# Patient Record
Sex: Female | Born: 1935 | Race: White | Hispanic: No | Smoking: Never smoker
Health system: Southern US, Community
[De-identification: ages and names within clinical notes are randomized; demographics above are authoritative.]

## PROBLEM LIST (undated history)

## (undated) DIAGNOSIS — H3530 Unspecified macular degeneration: Secondary | ICD-10-CM

## (undated) DIAGNOSIS — M1990 Unspecified osteoarthritis, unspecified site: Secondary | ICD-10-CM

## (undated) DIAGNOSIS — F329 Major depressive disorder, single episode, unspecified: Secondary | ICD-10-CM

## (undated) DIAGNOSIS — E785 Hyperlipidemia, unspecified: Secondary | ICD-10-CM

## (undated) DIAGNOSIS — K449 Diaphragmatic hernia without obstruction or gangrene: Secondary | ICD-10-CM

## (undated) DIAGNOSIS — H9190 Unspecified hearing loss, unspecified ear: Secondary | ICD-10-CM

## (undated) DIAGNOSIS — R011 Cardiac murmur, unspecified: Secondary | ICD-10-CM

## (undated) DIAGNOSIS — F32A Depression, unspecified: Secondary | ICD-10-CM

## (undated) DIAGNOSIS — I447 Left bundle-branch block, unspecified: Secondary | ICD-10-CM

## (undated) DIAGNOSIS — K219 Gastro-esophageal reflux disease without esophagitis: Secondary | ICD-10-CM

## (undated) DIAGNOSIS — I10 Essential (primary) hypertension: Secondary | ICD-10-CM

## (undated) DIAGNOSIS — R6889 Other general symptoms and signs: Secondary | ICD-10-CM

## (undated) DIAGNOSIS — C439 Malignant melanoma of skin, unspecified: Secondary | ICD-10-CM

## (undated) DIAGNOSIS — R911 Solitary pulmonary nodule: Secondary | ICD-10-CM

## (undated) DIAGNOSIS — J189 Pneumonia, unspecified organism: Secondary | ICD-10-CM

## (undated) DIAGNOSIS — N183 Chronic kidney disease, stage 3 unspecified: Secondary | ICD-10-CM

## (undated) DIAGNOSIS — Z9289 Personal history of other medical treatment: Secondary | ICD-10-CM

## (undated) DIAGNOSIS — E039 Hypothyroidism, unspecified: Secondary | ICD-10-CM

## (undated) HISTORY — DX: Hypothyroidism, unspecified: E03.9

## (undated) HISTORY — DX: Hyperlipidemia, unspecified: E78.5

## (undated) HISTORY — DX: Chronic kidney disease, stage 3 (moderate): N18.3

## (undated) HISTORY — DX: Chronic kidney disease, stage 3 unspecified: N18.30

## (undated) HISTORY — DX: Personal history of other medical treatment: Z92.89

## (undated) HISTORY — DX: Solitary pulmonary nodule: R91.1

## (undated) HISTORY — DX: Left bundle-branch block, unspecified: I44.7

## (undated) HISTORY — PX: BLADDER REPAIR: SHX76

## (undated) HISTORY — PX: ROTATOR CUFF REPAIR: SHX139

## (undated) HISTORY — DX: Cardiac murmur, unspecified: R01.1

## (undated) HISTORY — PX: OTHER SURGICAL HISTORY: SHX169

## (undated) HISTORY — PX: COLONOSCOPY: SHX174

## (undated) HISTORY — PX: TOTAL ABDOMINAL HYSTERECTOMY W/ BILATERAL SALPINGOOPHORECTOMY: SHX83

## (undated) HISTORY — PX: CARDIAC CATHETERIZATION: SHX172

## (undated) HISTORY — DX: Diaphragmatic hernia without obstruction or gangrene: K44.9

## (undated) HISTORY — DX: Gastro-esophageal reflux disease without esophagitis: K21.9

## (undated) HISTORY — DX: Malignant melanoma of skin, unspecified: C43.9

## (undated) HISTORY — PX: CATARACT EXTRACTION: SUR2

## (undated) HISTORY — DX: Essential (primary) hypertension: I10

## (undated) HISTORY — DX: Unspecified macular degeneration: H35.30

---
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
## 1998-07-29 ENCOUNTER — Other Ambulatory Visit: Admission: RE | Admit: 1998-07-29 | Discharge: 1998-07-29 | Payer: Self-pay

## 1999-07-09 ENCOUNTER — Other Ambulatory Visit: Admission: RE | Admit: 1999-07-09 | Discharge: 1999-07-09 | Payer: Self-pay | Admitting: *Deleted

## 1999-08-24 ENCOUNTER — Emergency Department (HOSPITAL_COMMUNITY): Admission: EM | Admit: 1999-08-24 | Discharge: 1999-08-24 | Payer: Self-pay | Admitting: Emergency Medicine

## 1999-08-24 ENCOUNTER — Encounter: Payer: Self-pay | Admitting: Emergency Medicine

## 2000-06-29 ENCOUNTER — Other Ambulatory Visit: Admission: RE | Admit: 2000-06-29 | Discharge: 2000-06-29 | Payer: Self-pay | Admitting: *Deleted

## 2000-07-11 ENCOUNTER — Encounter: Admission: RE | Admit: 2000-07-11 | Discharge: 2000-07-11 | Payer: Self-pay | Admitting: Orthopedic Surgery

## 2000-07-11 ENCOUNTER — Encounter: Payer: Self-pay | Admitting: Orthopedic Surgery

## 2000-08-16 ENCOUNTER — Encounter: Payer: Self-pay | Admitting: Orthopedic Surgery

## 2000-08-16 ENCOUNTER — Encounter: Admission: RE | Admit: 2000-08-16 | Discharge: 2000-08-16 | Payer: Self-pay | Admitting: Orthopedic Surgery

## 2000-09-13 ENCOUNTER — Encounter: Admission: RE | Admit: 2000-09-13 | Discharge: 2000-09-13 | Payer: Self-pay | Admitting: Orthopedic Surgery

## 2000-09-13 ENCOUNTER — Encounter: Payer: Self-pay | Admitting: Orthopedic Surgery

## 2001-02-14 ENCOUNTER — Encounter: Payer: Self-pay | Admitting: Orthopedic Surgery

## 2001-02-14 ENCOUNTER — Encounter: Admission: RE | Admit: 2001-02-14 | Discharge: 2001-02-14 | Payer: Self-pay | Admitting: Orthopedic Surgery

## 2001-07-12 ENCOUNTER — Emergency Department (HOSPITAL_COMMUNITY): Admission: EM | Admit: 2001-07-12 | Discharge: 2001-07-12 | Payer: Self-pay | Admitting: Emergency Medicine

## 2001-08-29 ENCOUNTER — Inpatient Hospital Stay (HOSPITAL_COMMUNITY): Admission: RE | Admit: 2001-08-29 | Discharge: 2001-09-01 | Payer: Self-pay | Admitting: *Deleted

## 2001-10-19 ENCOUNTER — Emergency Department (HOSPITAL_COMMUNITY): Admission: EM | Admit: 2001-10-19 | Discharge: 2001-10-20 | Payer: Self-pay | Admitting: Emergency Medicine

## 2001-10-20 ENCOUNTER — Encounter: Payer: Self-pay | Admitting: Emergency Medicine

## 2001-11-22 ENCOUNTER — Emergency Department (HOSPITAL_COMMUNITY): Admission: EM | Admit: 2001-11-22 | Discharge: 2001-11-23 | Payer: Self-pay | Admitting: Emergency Medicine

## 2001-11-23 ENCOUNTER — Encounter: Payer: Self-pay | Admitting: Emergency Medicine

## 2001-12-19 ENCOUNTER — Ambulatory Visit (HOSPITAL_COMMUNITY): Admission: RE | Admit: 2001-12-19 | Discharge: 2001-12-19 | Payer: Self-pay | Admitting: General Surgery

## 2002-06-11 ENCOUNTER — Encounter: Admission: RE | Admit: 2002-06-11 | Discharge: 2002-06-11 | Payer: Self-pay | Admitting: Orthopedic Surgery

## 2002-06-11 ENCOUNTER — Encounter: Payer: Self-pay | Admitting: Orthopedic Surgery

## 2002-06-12 ENCOUNTER — Ambulatory Visit (HOSPITAL_BASED_OUTPATIENT_CLINIC_OR_DEPARTMENT_OTHER): Admission: RE | Admit: 2002-06-12 | Discharge: 2002-06-12 | Payer: Self-pay | Admitting: Orthopedic Surgery

## 2002-07-02 ENCOUNTER — Ambulatory Visit (HOSPITAL_COMMUNITY): Admission: RE | Admit: 2002-07-02 | Discharge: 2002-07-03 | Payer: Self-pay | Admitting: Orthopedic Surgery

## 2002-07-25 ENCOUNTER — Other Ambulatory Visit: Admission: RE | Admit: 2002-07-25 | Discharge: 2002-07-25 | Payer: Self-pay | Admitting: *Deleted

## 2002-10-16 ENCOUNTER — Ambulatory Visit (HOSPITAL_COMMUNITY): Admission: RE | Admit: 2002-10-16 | Discharge: 2002-10-16 | Payer: Self-pay | Admitting: Gastroenterology

## 2003-04-13 DIAGNOSIS — Z9289 Personal history of other medical treatment: Secondary | ICD-10-CM

## 2003-04-13 HISTORY — DX: Personal history of other medical treatment: Z92.89

## 2003-06-30 ENCOUNTER — Observation Stay (HOSPITAL_COMMUNITY): Admission: EM | Admit: 2003-06-30 | Discharge: 2003-07-01 | Payer: Self-pay | Admitting: Emergency Medicine

## 2003-07-01 DIAGNOSIS — R911 Solitary pulmonary nodule: Secondary | ICD-10-CM

## 2003-07-01 HISTORY — DX: Solitary pulmonary nodule: R91.1

## 2003-09-02 ENCOUNTER — Other Ambulatory Visit: Admission: RE | Admit: 2003-09-02 | Discharge: 2003-09-02 | Payer: Self-pay | Admitting: Obstetrics and Gynecology

## 2004-10-21 ENCOUNTER — Observation Stay (HOSPITAL_COMMUNITY): Admission: RE | Admit: 2004-10-21 | Discharge: 2004-10-22 | Payer: Self-pay | Admitting: Orthopedic Surgery

## 2004-11-12 DIAGNOSIS — C439 Malignant melanoma of skin, unspecified: Secondary | ICD-10-CM

## 2004-11-12 HISTORY — DX: Malignant melanoma of skin, unspecified: C43.9

## 2004-11-19 ENCOUNTER — Ambulatory Visit (HOSPITAL_COMMUNITY): Admission: RE | Admit: 2004-11-19 | Discharge: 2004-11-19 | Payer: Self-pay | Admitting: General Surgery

## 2004-12-02 ENCOUNTER — Ambulatory Visit (HOSPITAL_COMMUNITY): Admission: RE | Admit: 2004-12-02 | Discharge: 2004-12-03 | Payer: Self-pay | Admitting: General Surgery

## 2004-12-02 ENCOUNTER — Encounter (INDEPENDENT_AMBULATORY_CARE_PROVIDER_SITE_OTHER): Payer: Self-pay | Admitting: *Deleted

## 2004-12-15 ENCOUNTER — Ambulatory Visit: Payer: Self-pay | Admitting: Oncology

## 2005-04-12 HISTORY — PX: TRANSTHORACIC ECHOCARDIOGRAM: SHX275

## 2005-04-19 ENCOUNTER — Ambulatory Visit: Payer: Self-pay | Admitting: Oncology

## 2005-08-29 ENCOUNTER — Encounter: Payer: Self-pay | Admitting: *Deleted

## 2005-08-30 ENCOUNTER — Observation Stay (HOSPITAL_COMMUNITY): Admission: EM | Admit: 2005-08-30 | Discharge: 2005-08-31 | Payer: Self-pay | Admitting: Cardiology

## 2005-09-23 ENCOUNTER — Ambulatory Visit (HOSPITAL_COMMUNITY): Admission: RE | Admit: 2005-09-23 | Discharge: 2005-09-23 | Payer: Self-pay | Admitting: Gastroenterology

## 2005-10-27 ENCOUNTER — Ambulatory Visit: Payer: Self-pay | Admitting: Oncology

## 2005-11-01 LAB — COMPREHENSIVE METABOLIC PANEL
ALT: 12 U/L (ref 0–40)
AST: 19 U/L (ref 0–37)
Albumin: 4.6 g/dL (ref 3.5–5.2)
Alkaline Phosphatase: 53 U/L (ref 39–117)
BUN: 31 mg/dL — ABNORMAL HIGH (ref 6–23)
CO2: 27 mEq/L (ref 19–32)
Calcium: 10.2 mg/dL (ref 8.4–10.5)
Chloride: 104 mEq/L (ref 96–112)
Creatinine, Ser: 1.38 mg/dL — ABNORMAL HIGH (ref 0.40–1.20)
Glucose, Bld: 101 mg/dL — ABNORMAL HIGH (ref 70–99)
Potassium: 4.3 mEq/L (ref 3.5–5.3)
Sodium: 141 mEq/L (ref 135–145)
Total Bilirubin: 0.6 mg/dL (ref 0.3–1.2)
Total Protein: 7.6 g/dL (ref 6.0–8.3)

## 2005-11-01 LAB — LACTATE DEHYDROGENASE: LDH: 172 U/L (ref 94–250)

## 2005-11-01 LAB — CBC WITH DIFFERENTIAL/PLATELET
BASO%: 0.6 % (ref 0.0–2.0)
Basophils Absolute: 0 10*3/uL (ref 0.0–0.1)
EOS%: 4.1 % (ref 0.0–7.0)
Eosinophils Absolute: 0.3 10*3/uL (ref 0.0–0.5)
HCT: 43.2 % (ref 34.8–46.6)
HGB: 14.6 g/dL (ref 11.6–15.9)
LYMPH%: 25.4 % (ref 14.0–48.0)
MCH: 32.5 pg (ref 26.0–34.0)
MCHC: 33.9 g/dL (ref 32.0–36.0)
MCV: 96.1 fL (ref 81.0–101.0)
MONO#: 0.7 10*3/uL (ref 0.1–0.9)
MONO%: 8.5 % (ref 0.0–13.0)
NEUT#: 4.9 10*3/uL (ref 1.5–6.5)
NEUT%: 61.4 % (ref 39.6–76.8)
Platelets: 310 10*3/uL (ref 145–400)
RBC: 4.49 10*6/uL (ref 3.70–5.32)
RDW: 13.8 % (ref 11.3–14.5)
WBC: 7.9 10*3/uL (ref 3.9–10.0)
lymph#: 2 10*3/uL (ref 0.9–3.3)

## 2006-01-20 ENCOUNTER — Ambulatory Visit (HOSPITAL_COMMUNITY): Admission: RE | Admit: 2006-01-20 | Discharge: 2006-01-20 | Payer: Self-pay | Admitting: Gastroenterology

## 2006-02-14 ENCOUNTER — Ambulatory Visit (HOSPITAL_COMMUNITY): Admission: RE | Admit: 2006-02-14 | Discharge: 2006-02-14 | Payer: Self-pay | Admitting: Gastroenterology

## 2006-04-28 ENCOUNTER — Ambulatory Visit: Payer: Self-pay | Admitting: Oncology

## 2006-05-03 LAB — COMPREHENSIVE METABOLIC PANEL
ALT: 13 U/L (ref 0–35)
AST: 21 U/L (ref 0–37)
Albumin: 4.5 g/dL (ref 3.5–5.2)
Alkaline Phosphatase: 46 U/L (ref 39–117)
BUN: 27 mg/dL — ABNORMAL HIGH (ref 6–23)
CO2: 27 mEq/L (ref 19–32)
Calcium: 9.7 mg/dL (ref 8.4–10.5)
Chloride: 105 mEq/L (ref 96–112)
Creatinine, Ser: 1.35 mg/dL — ABNORMAL HIGH (ref 0.40–1.20)
Glucose, Bld: 97 mg/dL (ref 70–99)
Potassium: 4.1 mEq/L (ref 3.5–5.3)
Sodium: 141 mEq/L (ref 135–145)
Total Bilirubin: 0.6 mg/dL (ref 0.3–1.2)
Total Protein: 7.5 g/dL (ref 6.0–8.3)

## 2006-05-03 LAB — CBC WITH DIFFERENTIAL/PLATELET
BASO%: 0.7 % (ref 0.0–2.0)
Basophils Absolute: 0.1 10*3/uL (ref 0.0–0.1)
EOS%: 3.8 % (ref 0.0–7.0)
Eosinophils Absolute: 0.3 10*3/uL (ref 0.0–0.5)
HCT: 42.3 % (ref 34.8–46.6)
HGB: 14.4 g/dL (ref 11.6–15.9)
LYMPH%: 25 % (ref 14.0–48.0)
MCH: 32.7 pg (ref 26.0–34.0)
MCHC: 34.1 g/dL (ref 32.0–36.0)
MCV: 96.1 fL (ref 81.0–101.0)
MONO#: 0.7 10*3/uL (ref 0.1–0.9)
MONO%: 9.2 % (ref 0.0–13.0)
NEUT#: 4.7 10*3/uL (ref 1.5–6.5)
NEUT%: 61.3 % (ref 39.6–76.8)
Platelets: 302 10*3/uL (ref 145–400)
RBC: 4.41 10*6/uL (ref 3.70–5.32)
RDW: 13.4 % (ref 11.3–14.5)
WBC: 7.6 10*3/uL (ref 3.9–10.0)
lymph#: 1.9 10*3/uL (ref 0.9–3.3)

## 2006-05-03 LAB — LACTATE DEHYDROGENASE: LDH: 189 U/L (ref 94–250)

## 2006-05-10 ENCOUNTER — Ambulatory Visit: Payer: Self-pay | Admitting: Internal Medicine

## 2006-06-07 ENCOUNTER — Ambulatory Visit: Payer: Self-pay | Admitting: Internal Medicine

## 2006-10-10 ENCOUNTER — Encounter: Admission: RE | Admit: 2006-10-10 | Discharge: 2006-10-10 | Payer: Self-pay | Admitting: Orthopedic Surgery

## 2006-10-11 ENCOUNTER — Ambulatory Visit (HOSPITAL_BASED_OUTPATIENT_CLINIC_OR_DEPARTMENT_OTHER): Admission: RE | Admit: 2006-10-11 | Discharge: 2006-10-11 | Payer: Self-pay | Admitting: Orthopedic Surgery

## 2006-10-28 ENCOUNTER — Ambulatory Visit: Payer: Self-pay | Admitting: Oncology

## 2006-11-01 LAB — CBC WITH DIFFERENTIAL/PLATELET
BASO%: 2.4 % — ABNORMAL HIGH (ref 0.0–2.0)
Basophils Absolute: 0.1 10*3/uL (ref 0.0–0.1)
EOS%: 6.6 % (ref 0.0–7.0)
Eosinophils Absolute: 0.4 10*3/uL (ref 0.0–0.5)
HCT: 36.8 % (ref 34.8–46.6)
HGB: 13 g/dL (ref 11.6–15.9)
LYMPH%: 23.3 % (ref 14.0–48.0)
MCH: 33.1 pg (ref 26.0–34.0)
MCHC: 35.5 g/dL (ref 32.0–36.0)
MCV: 93.2 fL (ref 81.0–101.0)
MONO#: 0.7 10*3/uL (ref 0.1–0.9)
MONO%: 10.8 % (ref 0.0–13.0)
NEUT#: 3.6 10*3/uL (ref 1.5–6.5)
NEUT%: 57 % (ref 39.6–76.8)
Platelets: 269 10*3/uL (ref 145–400)
RBC: 3.94 10*6/uL (ref 3.70–5.32)
RDW: 11.2 % — ABNORMAL LOW (ref 11.3–14.5)
WBC: 6.3 10*3/uL (ref 3.9–10.0)
lymph#: 1.5 10*3/uL (ref 0.9–3.3)

## 2006-11-01 LAB — COMPREHENSIVE METABOLIC PANEL
ALT: 11 U/L (ref 0–35)
AST: 22 U/L (ref 0–37)
Albumin: 4 g/dL (ref 3.5–5.2)
Alkaline Phosphatase: 43 U/L (ref 39–117)
BUN: 29 mg/dL — ABNORMAL HIGH (ref 6–23)
CO2: 19 mEq/L (ref 19–32)
Calcium: 9.2 mg/dL (ref 8.4–10.5)
Chloride: 110 mEq/L (ref 96–112)
Creatinine, Ser: 1.21 mg/dL — ABNORMAL HIGH (ref 0.40–1.20)
Glucose, Bld: 126 mg/dL — ABNORMAL HIGH (ref 70–99)
Potassium: 4.3 mEq/L (ref 3.5–5.3)
Sodium: 142 mEq/L (ref 135–145)
Total Bilirubin: 0.5 mg/dL (ref 0.3–1.2)
Total Protein: 6.4 g/dL (ref 6.0–8.3)

## 2006-11-01 LAB — LACTATE DEHYDROGENASE: LDH: 219 U/L (ref 94–250)

## 2007-05-02 ENCOUNTER — Ambulatory Visit: Payer: Self-pay | Admitting: Oncology

## 2007-05-04 LAB — COMPREHENSIVE METABOLIC PANEL
ALT: 17 U/L (ref 0–35)
AST: 24 U/L (ref 0–37)
Albumin: 4.1 g/dL (ref 3.5–5.2)
Alkaline Phosphatase: 53 U/L (ref 39–117)
BUN: 22 mg/dL (ref 6–23)
CO2: 24 mEq/L (ref 19–32)
Calcium: 9.2 mg/dL (ref 8.4–10.5)
Chloride: 106 mEq/L (ref 96–112)
Creatinine, Ser: 1.26 mg/dL — ABNORMAL HIGH (ref 0.40–1.20)
Glucose, Bld: 120 mg/dL — ABNORMAL HIGH (ref 70–99)
Potassium: 4.4 mEq/L (ref 3.5–5.3)
Sodium: 141 mEq/L (ref 135–145)
Total Bilirubin: 0.6 mg/dL (ref 0.3–1.2)
Total Protein: 6.9 g/dL (ref 6.0–8.3)

## 2007-05-04 LAB — CBC WITH DIFFERENTIAL/PLATELET
BASO%: 0.6 % (ref 0.0–2.0)
Basophils Absolute: 0 10*3/uL (ref 0.0–0.1)
EOS%: 5.4 % (ref 0.0–7.0)
Eosinophils Absolute: 0.4 10*3/uL (ref 0.0–0.5)
HCT: 38.3 % (ref 34.8–46.6)
HGB: 13.2 g/dL (ref 11.6–15.9)
LYMPH%: 27.4 % (ref 14.0–48.0)
MCH: 32.4 pg (ref 26.0–34.0)
MCHC: 34.5 g/dL (ref 32.0–36.0)
MCV: 94 fL (ref 81.0–101.0)
MONO#: 0.7 10*3/uL (ref 0.1–0.9)
MONO%: 10.8 % (ref 0.0–13.0)
NEUT#: 3.9 10*3/uL (ref 1.5–6.5)
NEUT%: 55.8 % (ref 39.6–76.8)
Platelets: 250 10*3/uL (ref 145–400)
RBC: 4.07 10*6/uL (ref 3.70–5.32)
RDW: 13.7 % (ref 11.3–14.5)
WBC: 6.9 10*3/uL (ref 3.9–10.0)
lymph#: 1.9 10*3/uL (ref 0.9–3.3)

## 2007-05-04 LAB — LACTATE DEHYDROGENASE: LDH: 189 U/L (ref 94–250)

## 2007-05-09 ENCOUNTER — Emergency Department (HOSPITAL_COMMUNITY): Admission: EM | Admit: 2007-05-09 | Discharge: 2007-05-09 | Payer: Self-pay | Admitting: Emergency Medicine

## 2007-10-31 ENCOUNTER — Ambulatory Visit: Payer: Self-pay | Admitting: Oncology

## 2007-11-02 ENCOUNTER — Ambulatory Visit (HOSPITAL_COMMUNITY): Admission: RE | Admit: 2007-11-02 | Discharge: 2007-11-02 | Payer: Self-pay | Admitting: Oncology

## 2007-11-02 LAB — CBC WITH DIFFERENTIAL/PLATELET
BASO%: 2.1 % — ABNORMAL HIGH (ref 0.0–2.0)
Basophils Absolute: 0.1 10*3/uL (ref 0.0–0.1)
EOS%: 5.6 % (ref 0.0–7.0)
Eosinophils Absolute: 0.4 10*3/uL (ref 0.0–0.5)
HCT: 41.1 % (ref 34.8–46.6)
HGB: 14.2 g/dL (ref 11.6–15.9)
LYMPH%: 21.3 % (ref 14.0–48.0)
MCH: 32.2 pg (ref 26.0–34.0)
MCHC: 34.6 g/dL (ref 32.0–36.0)
MCV: 92.9 fL (ref 81.0–101.0)
MONO#: 0.7 10*3/uL (ref 0.1–0.9)
MONO%: 10.1 % (ref 0.0–13.0)
NEUT#: 4.3 10*3/uL (ref 1.5–6.5)
NEUT%: 60.9 % (ref 39.6–76.8)
Platelets: 241 10*3/uL (ref 145–400)
RBC: 4.42 10*6/uL (ref 3.70–5.32)
RDW: 12.9 % (ref 11.3–14.5)
WBC: 7.1 10*3/uL (ref 3.9–10.0)
lymph#: 1.5 10*3/uL (ref 0.9–3.3)

## 2007-11-02 LAB — LACTATE DEHYDROGENASE: LDH: 175 U/L (ref 94–250)

## 2007-11-02 LAB — COMPREHENSIVE METABOLIC PANEL
ALT: 16 U/L (ref 0–35)
AST: 20 U/L (ref 0–37)
Albumin: 4.2 g/dL (ref 3.5–5.2)
Alkaline Phosphatase: 47 U/L (ref 39–117)
BUN: 24 mg/dL — ABNORMAL HIGH (ref 6–23)
CO2: 21 mEq/L (ref 19–32)
Calcium: 9.7 mg/dL (ref 8.4–10.5)
Chloride: 108 mEq/L (ref 96–112)
Creatinine, Ser: 1.22 mg/dL — ABNORMAL HIGH (ref 0.40–1.20)
Glucose, Bld: 86 mg/dL (ref 70–99)
Potassium: 4.4 mEq/L (ref 3.5–5.3)
Sodium: 140 mEq/L (ref 135–145)
Total Bilirubin: 0.8 mg/dL (ref 0.3–1.2)
Total Protein: 6.7 g/dL (ref 6.0–8.3)

## 2008-05-02 ENCOUNTER — Ambulatory Visit: Payer: Self-pay | Admitting: Oncology

## 2008-11-04 ENCOUNTER — Encounter: Admission: RE | Admit: 2008-11-04 | Discharge: 2008-11-04 | Payer: Self-pay | Admitting: Endocrinology

## 2008-11-06 ENCOUNTER — Ambulatory Visit: Payer: Self-pay | Admitting: Oncology

## 2008-11-11 LAB — COMPREHENSIVE METABOLIC PANEL
ALT: 12 U/L (ref 0–35)
AST: 16 U/L (ref 0–37)
Albumin: 4.1 g/dL (ref 3.5–5.2)
Alkaline Phosphatase: 54 U/L (ref 39–117)
BUN: 24 mg/dL — ABNORMAL HIGH (ref 6–23)
CO2: 24 mEq/L (ref 19–32)
Calcium: 9.9 mg/dL (ref 8.4–10.5)
Chloride: 107 mEq/L (ref 96–112)
Creatinine, Ser: 1.4 mg/dL — ABNORMAL HIGH (ref 0.40–1.20)
Glucose, Bld: 89 mg/dL (ref 70–99)
Potassium: 4.1 mEq/L (ref 3.5–5.3)
Sodium: 141 mEq/L (ref 135–145)
Total Bilirubin: 0.4 mg/dL (ref 0.3–1.2)
Total Protein: 6.8 g/dL (ref 6.0–8.3)

## 2008-11-11 LAB — CBC WITH DIFFERENTIAL/PLATELET
BASO%: 0.4 % (ref 0.0–2.0)
Basophils Absolute: 0 10*3/uL (ref 0.0–0.1)
EOS%: 5.1 % (ref 0.0–7.0)
Eosinophils Absolute: 0.4 10*3/uL (ref 0.0–0.5)
HCT: 38.8 % (ref 34.8–46.6)
HGB: 13.1 g/dL (ref 11.6–15.9)
LYMPH%: 22.7 % (ref 14.0–49.7)
MCH: 32.4 pg (ref 25.1–34.0)
MCHC: 33.9 g/dL (ref 31.5–36.0)
MCV: 95.5 fL (ref 79.5–101.0)
MONO#: 0.6 10*3/uL (ref 0.1–0.9)
MONO%: 9.1 % (ref 0.0–14.0)
NEUT#: 4.5 10*3/uL (ref 1.5–6.5)
NEUT%: 62.7 % (ref 38.4–76.8)
Platelets: 263 10*3/uL (ref 145–400)
RBC: 4.06 10*6/uL (ref 3.70–5.45)
RDW: 13.7 % (ref 11.2–14.5)
WBC: 7.1 10*3/uL (ref 3.9–10.3)
lymph#: 1.6 10*3/uL (ref 0.9–3.3)

## 2008-11-11 LAB — LACTATE DEHYDROGENASE: LDH: 163 U/L (ref 94–250)

## 2009-02-19 ENCOUNTER — Encounter: Admission: RE | Admit: 2009-02-19 | Discharge: 2009-02-19 | Payer: Self-pay | Admitting: Orthopedic Surgery

## 2009-03-18 ENCOUNTER — Ambulatory Visit (HOSPITAL_COMMUNITY): Admission: RE | Admit: 2009-03-18 | Discharge: 2009-03-19 | Payer: Self-pay | Admitting: Orthopedic Surgery

## 2009-05-09 ENCOUNTER — Ambulatory Visit: Payer: Self-pay | Admitting: Oncology

## 2009-05-13 LAB — CBC WITH DIFFERENTIAL/PLATELET
BASO%: 0.6 % (ref 0.0–2.0)
Basophils Absolute: 0 10*3/uL (ref 0.0–0.1)
EOS%: 6.3 % (ref 0.0–7.0)
Eosinophils Absolute: 0.4 10*3/uL (ref 0.0–0.5)
HCT: 40.7 % (ref 34.8–46.6)
HGB: 13.7 g/dL (ref 11.6–15.9)
LYMPH%: 23.6 % (ref 14.0–49.7)
MCH: 32.6 pg (ref 25.1–34.0)
MCHC: 33.6 g/dL (ref 31.5–36.0)
MCV: 97.2 fL (ref 79.5–101.0)
MONO#: 0.6 10*3/uL (ref 0.1–0.9)
MONO%: 8.1 % (ref 0.0–14.0)
NEUT#: 4.4 10*3/uL (ref 1.5–6.5)
NEUT%: 61.4 % (ref 38.4–76.8)
Platelets: 240 10*3/uL (ref 145–400)
RBC: 4.19 10*6/uL (ref 3.70–5.45)
RDW: 13.8 % (ref 11.2–14.5)
WBC: 7.1 10*3/uL (ref 3.9–10.3)
lymph#: 1.7 10*3/uL (ref 0.9–3.3)

## 2009-05-13 LAB — COMPREHENSIVE METABOLIC PANEL
ALT: 15 U/L (ref 0–35)
AST: 23 U/L (ref 0–37)
Albumin: 4.2 g/dL (ref 3.5–5.2)
Alkaline Phosphatase: 53 U/L (ref 39–117)
BUN: 19 mg/dL (ref 6–23)
CO2: 24 mEq/L (ref 19–32)
Calcium: 9.3 mg/dL (ref 8.4–10.5)
Chloride: 105 mEq/L (ref 96–112)
Creatinine, Ser: 1.05 mg/dL (ref 0.40–1.20)
Glucose, Bld: 85 mg/dL (ref 70–99)
Potassium: 4.4 mEq/L (ref 3.5–5.3)
Sodium: 140 mEq/L (ref 135–145)
Total Bilirubin: 0.4 mg/dL (ref 0.3–1.2)
Total Protein: 7.1 g/dL (ref 6.0–8.3)

## 2009-05-13 LAB — LACTATE DEHYDROGENASE: LDH: 181 U/L (ref 94–250)

## 2009-10-18 ENCOUNTER — Emergency Department (HOSPITAL_COMMUNITY): Admission: EM | Admit: 2009-10-18 | Discharge: 2009-10-18 | Payer: Self-pay | Admitting: Emergency Medicine

## 2009-11-06 ENCOUNTER — Ambulatory Visit (HOSPITAL_BASED_OUTPATIENT_CLINIC_OR_DEPARTMENT_OTHER): Payer: Medicare Other | Admitting: Oncology

## 2009-11-10 LAB — CBC WITH DIFFERENTIAL/PLATELET
BASO%: 0.9 % (ref 0.0–2.0)
Basophils Absolute: 0.1 10*3/uL (ref 0.0–0.1)
EOS%: 8.5 % — ABNORMAL HIGH (ref 0.0–7.0)
Eosinophils Absolute: 0.6 10*3/uL — ABNORMAL HIGH (ref 0.0–0.5)
HCT: 40 % (ref 34.8–46.6)
HGB: 13.5 g/dL (ref 11.6–15.9)
LYMPH%: 23.7 % (ref 14.0–49.7)
MCH: 33 pg (ref 25.1–34.0)
MCHC: 33.8 g/dL (ref 31.5–36.0)
MCV: 97.7 fL (ref 79.5–101.0)
MONO#: 0.6 10*3/uL (ref 0.1–0.9)
MONO%: 8.9 % (ref 0.0–14.0)
NEUT#: 4 10*3/uL (ref 1.5–6.5)
NEUT%: 58 % (ref 38.4–76.8)
Platelets: 296 10*3/uL (ref 145–400)
RBC: 4.09 10*6/uL (ref 3.70–5.45)
RDW: 12.9 % (ref 11.2–14.5)
WBC: 6.9 10*3/uL (ref 3.9–10.3)
lymph#: 1.6 10*3/uL (ref 0.9–3.3)

## 2009-11-10 LAB — COMPREHENSIVE METABOLIC PANEL
ALT: 18 U/L (ref 0–35)
AST: 22 U/L (ref 0–37)
Albumin: 3.6 g/dL (ref 3.5–5.2)
Alkaline Phosphatase: 54 U/L (ref 39–117)
BUN: 23 mg/dL (ref 6–23)
CO2: 28 mEq/L (ref 19–32)
Calcium: 9.7 mg/dL (ref 8.4–10.5)
Chloride: 105 mEq/L (ref 96–112)
Creatinine, Ser: 1.23 mg/dL — ABNORMAL HIGH (ref 0.40–1.20)
Glucose, Bld: 112 mg/dL — ABNORMAL HIGH (ref 70–99)
Potassium: 4.3 mEq/L (ref 3.5–5.3)
Sodium: 140 mEq/L (ref 135–145)
Total Bilirubin: 0.5 mg/dL (ref 0.3–1.2)
Total Protein: 6.9 g/dL (ref 6.0–8.3)

## 2009-11-10 LAB — LACTATE DEHYDROGENASE: LDH: 148 U/L (ref 94–250)

## 2010-01-26 ENCOUNTER — Encounter: Admission: RE | Admit: 2010-01-26 | Discharge: 2010-01-26 | Payer: Self-pay | Admitting: Orthopedic Surgery

## 2010-03-11 ENCOUNTER — Encounter (HOSPITAL_BASED_OUTPATIENT_CLINIC_OR_DEPARTMENT_OTHER)
Admission: RE | Admit: 2010-03-11 | Discharge: 2010-05-12 | Payer: Self-pay | Source: Home / Self Care | Attending: General Surgery | Admitting: General Surgery

## 2010-05-14 ENCOUNTER — Encounter (HOSPITAL_BASED_OUTPATIENT_CLINIC_OR_DEPARTMENT_OTHER): Payer: Medicare Other | Admitting: Oncology

## 2010-05-14 DIAGNOSIS — C4360 Malignant melanoma of unspecified upper limb, including shoulder: Secondary | ICD-10-CM

## 2010-05-14 LAB — COMPREHENSIVE METABOLIC PANEL
ALT: 17 U/L (ref 0–35)
AST: 25 U/L (ref 0–37)
Albumin: 3.7 g/dL (ref 3.5–5.2)
Alkaline Phosphatase: 46 U/L (ref 39–117)
BUN: 26 mg/dL — ABNORMAL HIGH (ref 6–23)
CO2: 27 mEq/L (ref 19–32)
Calcium: 9.6 mg/dL (ref 8.4–10.5)
Chloride: 110 mEq/L (ref 96–112)
Creatinine, Ser: 1.53 mg/dL — ABNORMAL HIGH (ref 0.40–1.20)
Glucose, Bld: 112 mg/dL — ABNORMAL HIGH (ref 70–99)
Potassium: 4 mEq/L (ref 3.5–5.3)
Sodium: 145 mEq/L (ref 135–145)
Total Bilirubin: 0.7 mg/dL (ref 0.3–1.2)
Total Protein: 6.8 g/dL (ref 6.0–8.3)

## 2010-05-14 LAB — CBC WITH DIFFERENTIAL/PLATELET
BASO%: 0.2 % (ref 0.0–2.0)
Basophils Absolute: 0 10*3/uL (ref 0.0–0.1)
EOS%: 6 % (ref 0.0–7.0)
Eosinophils Absolute: 0.4 10*3/uL (ref 0.0–0.5)
HCT: 37.4 % (ref 34.8–46.6)
HGB: 12.6 g/dL (ref 11.6–15.9)
LYMPH%: 23.3 % (ref 14.0–49.7)
MCH: 32.2 pg (ref 25.1–34.0)
MCHC: 33.7 g/dL (ref 31.5–36.0)
MCV: 95.6 fL (ref 79.5–101.0)
MONO#: 0.6 10*3/uL (ref 0.1–0.9)
MONO%: 8.8 % (ref 0.0–14.0)
NEUT#: 4.2 10*3/uL (ref 1.5–6.5)
NEUT%: 61.7 % (ref 38.4–76.8)
Platelets: 220 10*3/uL (ref 145–400)
RBC: 3.91 10*6/uL (ref 3.70–5.45)
RDW: 13.9 % (ref 11.2–14.5)
WBC: 6.9 10*3/uL (ref 3.9–10.3)
lymph#: 1.6 10*3/uL (ref 0.9–3.3)

## 2010-05-14 LAB — LACTATE DEHYDROGENASE: LDH: 162 U/L (ref 94–250)

## 2010-06-25 ENCOUNTER — Encounter (HOSPITAL_COMMUNITY): Payer: Medicare Other

## 2010-06-25 ENCOUNTER — Other Ambulatory Visit: Payer: Self-pay | Admitting: Orthopedic Surgery

## 2010-06-25 LAB — CBC
HCT: 40.3 % (ref 36.0–46.0)
Hemoglobin: 13.1 g/dL (ref 12.0–15.0)
MCH: 31.1 pg (ref 26.0–34.0)
MCHC: 32.5 g/dL (ref 30.0–36.0)
MCV: 95.7 fL (ref 78.0–100.0)
Platelets: 265 10*3/uL (ref 150–400)
RBC: 4.21 MIL/uL (ref 3.87–5.11)
RDW: 13.7 % (ref 11.5–15.5)
WBC: 7.6 10*3/uL (ref 4.0–10.5)

## 2010-06-25 LAB — URINALYSIS, ROUTINE W REFLEX MICROSCOPIC
Bilirubin Urine: NEGATIVE
Glucose, UA: NEGATIVE mg/dL
Hgb urine dipstick: NEGATIVE
Ketones, ur: NEGATIVE mg/dL
Nitrite: NEGATIVE
Protein, ur: NEGATIVE mg/dL
Specific Gravity, Urine: 1.016 (ref 1.005–1.030)
Urobilinogen, UA: 0.2 mg/dL (ref 0.0–1.0)
pH: 5 (ref 5.0–8.0)

## 2010-06-25 LAB — SURGICAL PCR SCREEN
MRSA, PCR: NEGATIVE
Staphylococcus aureus: POSITIVE — AB

## 2010-06-25 LAB — APTT: aPTT: 31 seconds (ref 24–37)

## 2010-06-25 LAB — COMPREHENSIVE METABOLIC PANEL
ALT: 17 U/L (ref 0–35)
AST: 24 U/L (ref 0–37)
Albumin: 3.7 g/dL (ref 3.5–5.2)
Alkaline Phosphatase: 59 U/L (ref 39–117)
BUN: 28 mg/dL — ABNORMAL HIGH (ref 6–23)
CO2: 26 mEq/L (ref 19–32)
Calcium: 9.7 mg/dL (ref 8.4–10.5)
Chloride: 107 mEq/L (ref 96–112)
Creatinine, Ser: 1.52 mg/dL — ABNORMAL HIGH (ref 0.4–1.2)
GFR calc Af Amer: 39 mL/min — ABNORMAL LOW (ref >60)
GFR calc non Af Amer: 33 mL/min — ABNORMAL LOW (ref >60)
Glucose, Bld: 105 mg/dL — ABNORMAL HIGH (ref 70–99)
Potassium: 4.2 mEq/L (ref 3.5–5.1)
Sodium: 141 mEq/L (ref 135–145)
Total Bilirubin: 0.8 mg/dL (ref 0.3–1.2)
Total Protein: 7 g/dL (ref 6.0–8.3)

## 2010-06-25 LAB — PROTIME-INR
INR: 1.03 (ref 0.00–1.49)
Prothrombin Time: 13.7 seconds (ref 11.6–15.2)

## 2010-06-25 LAB — URINE MICROSCOPIC-ADD ON

## 2010-06-28 LAB — CBC
HCT: 38.3 % (ref 36.0–46.0)
Hemoglobin: 13.6 g/dL (ref 12.0–15.0)
MCH: 34 pg (ref 26.0–34.0)
MCHC: 35.3 g/dL (ref 30.0–36.0)
MCV: 96.1 fL (ref 78.0–100.0)
Platelets: 252 10*3/uL (ref 150–400)
RBC: 3.99 MIL/uL (ref 3.87–5.11)
RDW: 12.6 % (ref 11.5–15.5)
WBC: 6.3 10*3/uL (ref 4.0–10.5)

## 2010-06-28 LAB — BASIC METABOLIC PANEL
BUN: 23 mg/dL (ref 6–23)
CO2: 28 mEq/L (ref 19–32)
Calcium: 9.4 mg/dL (ref 8.4–10.5)
Chloride: 105 mEq/L (ref 96–112)
Creatinine, Ser: 1.36 mg/dL — ABNORMAL HIGH (ref 0.4–1.2)
GFR calc Af Amer: 45 mL/min — ABNORMAL LOW (ref >60)
GFR calc non Af Amer: 37 mL/min — ABNORMAL LOW (ref >60)
Glucose, Bld: 123 mg/dL — ABNORMAL HIGH (ref 70–99)
Potassium: 4.2 mEq/L (ref 3.5–5.1)
Sodium: 139 mEq/L (ref 135–145)

## 2010-06-28 LAB — DIFFERENTIAL
Basophils Absolute: 0 10*3/uL (ref 0.0–0.1)
Basophils Relative: 1 % (ref 0–1)
Eosinophils Absolute: 0.3 10*3/uL (ref 0.0–0.7)
Eosinophils Relative: 6 % — ABNORMAL HIGH (ref 0–5)
Lymphocytes Relative: 21 % (ref 12–46)
Lymphs Abs: 1.3 10*3/uL (ref 0.7–4.0)
Monocytes Absolute: 0.5 10*3/uL (ref 0.1–1.0)
Monocytes Relative: 7 % (ref 3–12)
Neutro Abs: 4.2 10*3/uL (ref 1.7–7.7)
Neutrophils Relative %: 66 % (ref 43–77)

## 2010-06-28 LAB — POCT CARDIAC MARKERS
CKMB, poc: 1.3 ng/mL (ref 1.0–8.0)
Myoglobin, poc: 102 ng/mL (ref 12–200)
Troponin i, poc: <0.05 ng/mL (ref 0.00–0.09)

## 2010-06-28 LAB — D-DIMER, QUANTITATIVE: D-Dimer, Quant: 0.5 ug/mL-FEU — ABNORMAL HIGH (ref 0.00–0.48)

## 2010-07-01 ENCOUNTER — Ambulatory Visit (HOSPITAL_COMMUNITY)
Admission: RE | Admit: 2010-07-01 | Discharge: 2010-07-02 | Disposition: A | Discharge status: Home / Self Care | Payer: Medicare Other | Source: Ambulatory Visit | Attending: Orthopedic Surgery | Admitting: Orthopedic Surgery

## 2010-07-01 DIAGNOSIS — Z01812 Encounter for preprocedural laboratory examination: Secondary | ICD-10-CM | POA: Insufficient documentation

## 2010-07-01 DIAGNOSIS — S83289A Other tear of lateral meniscus, current injury, unspecified knee, initial encounter: Secondary | ICD-10-CM | POA: Insufficient documentation

## 2010-07-01 DIAGNOSIS — Z79899 Other long term (current) drug therapy: Secondary | ICD-10-CM | POA: Insufficient documentation

## 2010-07-01 DIAGNOSIS — X58XXXA Exposure to other specified factors, initial encounter: Secondary | ICD-10-CM | POA: Insufficient documentation

## 2010-07-01 DIAGNOSIS — M25569 Pain in unspecified knee: Secondary | ICD-10-CM | POA: Insufficient documentation

## 2010-07-01 DIAGNOSIS — IMO0002 Reserved for concepts with insufficient information to code with codable children: Secondary | ICD-10-CM | POA: Insufficient documentation

## 2010-07-01 DIAGNOSIS — I10 Essential (primary) hypertension: Secondary | ICD-10-CM | POA: Insufficient documentation

## 2010-07-06 NOTE — Op Note (Signed)
  Cindy [name], Cindy [name]                ACCOUNT NO.:  [id number]  MEDICAL RECORD NO.:  [id number]           PATIENT TYPE:  O  LOCATION:  DAYL                         FACILITY:  [facility]  PHYSICIAN:  Gaynelle Arabian, M.D.    DATE OF BIRTH:  05-24-1922  DATE OF PROCEDURE: DATE OF DISCHARGE:                              OPERATIVE REPORT   PREOPERATIVE DIAGNOSIS:  Left knee medial meniscal tear.  POSTOPERATIVE DIAGNOSIS:  Left knee medial meniscal tear plus lateral meniscal tear.  PROCEDURE:  Left knee arthroscopy with medial and lateral meniscal debridement.  SURGEON:  Gaynelle Arabian, M.D.  ASSISTANT:  None.  ANESTHESIA:  General.  ESTIMATED BLOOD LOSS:  Minimal.  DRAINS:  None.  COMPLICATIONS:  None.  CONDITION:  Stable to recovery.  BRIEF CLINICAL NOTE:  [name] is an 77 year old female with significant left knee pain mechanical symptoms.  Examining history suggested medial meniscal tear, confirmed by MRI.  She presents for arthroscopy and debridement.  PROCEDURE IN DETAIL:  After successful administration of general anesthetic, tourniquet was placed on the left thigh.  Left lower extremity was prepped and draped in usual sterile fashion.  Standard superomedial and inferolateral incisions were made and flow cannula passed, superomedial camera passed inferolateral.  Arthroscopic visualization proceeds.  Undersurface patella and trochlea showed some grade 2 chondromalacia.  No full-thickness chondral defects and no unstable cartilage.  Medial and lateral gutters were visualized.  There are no loose bodies.  Flexion valgus force applied and the knee in the medial compartment was entered.  There is some grade 2 chondromalacia in the medial femoral condyle and tibial plateau but no full-thickness defects.  There is significant tear in the body and posterior horn of the medial meniscus.  Spinal needle is used to localize inferomedial portal, small incision made and dilator placed.   Meniscus debrided back to stable base with baskets and a 4.2-mm shaver and then sealed off with the ArthroCare device.  Nothing else needed to be done with the medial compartment.  The intercondylar notch was visualized and ACL was normal. Lateral compartment was entered and there is some surface tearing of the lateral meniscus.  This debrided back to stable base with the shaver and baskets.  The joint was again inspected.  No other tears, defects or loose bodies were noted.  Arthroscopic equipments then removed from the inferior portals which were closed with interrupted 4-0 nylon.  20 mL of 4% Marcaine with epi injected with the inflow cannula and then that is removed and that portal closed with nylon.  Incisions are cleaned and dried.  A bulky sterile dressing applied.  She was then awakened and transported to recovery in stable condition.     Gaynelle Arabian, M.D.     FA/MEDQ  D:  07/01/2010  T:  07/01/2010  Job:  [id number]  Electronically Signed by Gaynelle Arabian M.D. on 07/06/2010 07:11:04 AM

## 2010-07-14 LAB — URINALYSIS, ROUTINE W REFLEX MICROSCOPIC
Bilirubin Urine: NEGATIVE
Glucose, UA: NEGATIVE mg/dL
Hgb urine dipstick: NEGATIVE
Ketones, ur: NEGATIVE mg/dL
Nitrite: POSITIVE — AB
Protein, ur: NEGATIVE mg/dL
Specific Gravity, Urine: 1.01 (ref 1.005–1.030)
Urobilinogen, UA: 0.2 mg/dL (ref 0.0–1.0)
pH: 6.5 (ref 5.0–8.0)

## 2010-07-14 LAB — COMPREHENSIVE METABOLIC PANEL
ALT: 20 U/L (ref 0–35)
AST: 22 U/L (ref 0–37)
Albumin: 3.7 g/dL (ref 3.5–5.2)
Alkaline Phosphatase: 45 U/L (ref 39–117)
BUN: 25 mg/dL — ABNORMAL HIGH (ref 6–23)
CO2: 28 mEq/L (ref 19–32)
Calcium: 9.5 mg/dL (ref 8.4–10.5)
Chloride: 108 mEq/L (ref 96–112)
Creatinine, Ser: 1.25 mg/dL — ABNORMAL HIGH (ref 0.4–1.2)
GFR calc Af Amer: 49 mL/min — ABNORMAL LOW (ref >60)
GFR calc non Af Amer: 41 mL/min — ABNORMAL LOW (ref >60)
Glucose, Bld: 106 mg/dL — ABNORMAL HIGH (ref 70–99)
Potassium: 4.9 mEq/L (ref 3.5–5.1)
Sodium: 143 mEq/L (ref 135–145)
Total Bilirubin: 0.8 mg/dL (ref 0.3–1.2)
Total Protein: 6.9 g/dL (ref 6.0–8.3)

## 2010-07-14 LAB — CBC
HCT: 40.3 % (ref 36.0–46.0)
Hemoglobin: 13.3 g/dL (ref 12.0–15.0)
MCHC: 33 g/dL (ref 30.0–36.0)
MCV: 97.5 fL (ref 78.0–100.0)
Platelets: 248 10*3/uL (ref 150–400)
RBC: 4.14 MIL/uL (ref 3.87–5.11)
RDW: 14.4 % (ref 11.5–15.5)
WBC: 7 10*3/uL (ref 4.0–10.5)

## 2010-07-14 LAB — URINE MICROSCOPIC-ADD ON

## 2010-07-14 LAB — PROTIME-INR
INR: 0.98 (ref 0.00–1.49)
Prothrombin Time: 12.9 seconds (ref 11.6–15.2)

## 2010-07-14 LAB — APTT: aPTT: 28 seconds (ref 24–37)

## 2010-08-25 NOTE — Op Note (Signed)
NAMECHARLEAN, KIJOWSKI NO.:  [id number]   MEDICAL RECORD NO.:  [id number]          PATIENT TYPE:  AMB   LOCATION:  [place]                          FACILITY:  [facility]   PHYSICIAN:  Weber Cooks, M.D.     DATE OF BIRTH:  1923/01/13   DATE OF PROCEDURE:  10/11/2006  DATE OF DISCHARGE:                               OPERATIVE REPORT   PREOPERATIVE DIAGNOSIS:  Left hallux rigidus.   POSTOPERATIVE DIAGNOSIS:  Left hallux rigidus.   OPERATION:  Left great toe cheilectomy.   ANESTHESIA:  General anesthesia.   SURGEON:  Weber Cooks, M.D.   ASSISTANT:  Lockie Mola, P.A.   ESTIMATED BLOOD LOSS:  Minimal.   TOURNIQUET TIME:  Approximately 20 minutes.   COMPLICATIONS:  None.   DISPOSITION:  Stable to PR.   INDICATIONS:  This is a 77 year old female who has had a spur with  actually an ulcer over it that developed a superficial infection. We  cleared up infection and now consented for the above procedure for  cheilectomy to remove the chronic irritant and hopefully indirectly  relieve her of any future ulceration. We reviewed all risks which  include infection, nerve vessel injury, recurrence of infection, deep  infection with osteomyelitis were all explained.  Questions were  encouraged and answered.   OPERATION:  Patient brought to the operating room and placed in supine  position. After adequate general endotracheal tube anesthesia was  administered as well as Ancef 1 gram IV piggyback.  The left lower  extremity prepped and draped in sterile manner over proximally thigh  tourniquet, limb was gravity saline.  Tourniquet elevated to 290 mmHg. A  longitudinal incision on dorsal aspect just medial to the EHL tendon was  then made.  Dissection was carried down through skin.  Hemostasis was  obtained.  Longitudinal capsulotomy of the great toe MTP joint was then  made medial to the EHL tendon. EHL tendon sheath was not violated.  Soft  tissue was elevated medially  and laterally.  There was no active  infection or purulence. Spur was then removed with a sagittal saw.  This  completely decompressed the spur on the base of the proximal phalanx was  also removed as well.  The area was copiously irrigated with normal  saline.  Bone wax applied to exposed bony surfaces.  The area was  copiously  irrigated with normal saline.  Capsule was closed with 2-0 Vicryl  stitch.  Hemostasis was obtained.  Tourniquet deflated.  Hemostasis was  obtained.  Skin was closed 4-0 nylon stitch.  Palpating this through the  skin this was completely decompressed.  Sterile dressing was applied.  Hard sole shoe was applied.  Patient stable to PR.      Weber Cooks, M.D.  Electronically Signed     PB/MEDQ  D:  10/11/2006  T:  10/11/2006  Job:  [id number]

## 2010-08-28 NOTE — Op Note (Signed)
Cindy [name], Cindy [name]                ACCOUNT NO.:  [id number]   MEDICAL RECORD NO.:  [id number]          PATIENT TYPE:  OIB   LOCATION:  2550                         FACILITY:  [facility]   PHYSICIAN:  Gwenyth Ober, M.D.    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   POSTOPERATIVE DIAGNOSIS:  Previously-excised melanoma of the left upper arm.   PROCEDURE:  Re-excision of melanoma site with left sentinel lymph node  biopsy of the axilla.   SURGEON:  Gwenyth Ober, M.D.   ASSISTANT:  None.   ANESTHESIA:  General endotracheal.   ESTIMATED BLOOD LOSS:  Less than 30 mL.   COMPLICATIONS:  None.   CONDITION:  Stable.   SPECIMEN:  Two radioactively active lymph nodes at the left axilla, labeled  sentinel lymph node #1 and #2, and also oval piece of skin including the  previously-excised melanoma site into the subcutaneous tissue, down to the  muscle fascia measuring approximately to 7 x 3 cm in size and 3 cm deep.   COMPLICATIONS:  None.   CONDITION:  Stable.   INDICATIONS FOR OPERATION:  The patient is an 77 year old had a melanoma  excised from her left upper arm, [place], I believe it was level III, who  comes in now for re-excision.   OPERATION:  The patient had injection of radioactive nuclide prior to being  brought up to the operating room.  We then injected her with vital blue in  the area around the melanoma the site excision for identification of lymph  nodes.   We started with a re-excision of the melanoma site in the left upper arm.  We draped the patient's arm across her body to expose the left upper humeral  area.  We prepped it and draped in the usual sterile manner.  We used a #10  blade to make an oval incision around the marked melanoma site.  This was  after it had been injected with vital blue.  We  dissected down into the  subcu and then deep down to muscle fascia using electrocautery, then  obtaining hemostasis with electrocautery.  Once the specimen was removed, we  marked the inferior aspect of the incision as the oval piece went down  towards the arm, the lower portion of the arm, using a long stitch, and the  short stitch was on the superior aspect.  We closed in three layers, a deep2-  0 Vicryl layer, subcutaneous  3-0 Vicryl layer, and then the skin was closed  using simple stitches of 4-0 nylon.  A sterile dressing was applied.  We  then placed her arm  out at 90 degrees with a roll underneath the arm in  order to prep the axilla for the sentinel lymph node biopsy.   The area of highest radioactive activity was identified using the NeoProbe  prior to making the skin incision.  We then used that as  a marker for making  the skin incision, where we dissected down into the subcutaneous tissue and  then into the axillary fat tissue, where we were able to identify initially  a lymph node, which we did excise using Metzenbaum scissors and  electrocautery.  This continued to show [place] complete high levels of  radioactivity after it was removed completely from the axilla, and that was  our sentinel lymph node #1.  Upon placing the NeoProbe back into the open  axillary wound, we were able to identify the lymph node with high  radioactivity, which was excised and sent as sentinel lymph #2.  No other  hot radioactively high-activity nodes were noted.  We subsequently closed in  two layers with 3-0 Vicryl in the subcu layer and the skin closed using  simple stitches of 4-0 nylon.  All counts were correct.  We placed Betadine  ointment and sterile dressings on each wound.      Gwenyth Ober, M.D.  Electronically Signed     JOW/MEDQ  D:  12/02/2004  T:  12/02/2004  Job:  [id number]

## 2010-08-28 NOTE — Cardiovascular Report (Signed)
NAMESTEFANE, GROUNDS                ACCOUNT NO.:  [id number]   MEDICAL RECORD NO.:  [id number]          PATIENT TYPE:  INP   LOCATION:  6525                         FACILITY:  [facility]   PHYSICIAN:  Ulice Dash R. Einar Gip, MD       DATE OF BIRTH:  1923-03-06   DATE OF PROCEDURE:  08/30/2005  DATE OF DISCHARGE:                              CARDIAC CATHETERIZATION   ATTENDING CARDIOLOGIST:  Quay Burow, M.D.   ATTENDING PHYSICIAN:  Juanda Bond. Altheimer, M.D.   PROCEDURE:  1.  Left ventriculography.  2.  Selective right and left coronary arteriography.  3.  Right femoral angiography and closure of the right femoral artery  axis      with Starclose.   INDICATIONS FOR PROCEDURE:  Ms. Prem is an 77 year old fairly active female  with a history of hypertension, hyperlipidemia, family history of coronary  artery disease who was admitted to [facility] complaining of chest  discomfort. This had started after she ate some [place] food. However given  her multiple cardiovascular risk factors and her ongoing symptoms of chest  pain with multiple outpatient evaluations, we decided to proceed with repeat  cardiac catheterization to evaluate the coronary anatomy for definite  diagnosis of coronary disease.   HEMODYNAMIC DATA:  The left ventricular pressures were 130/6 with end  diastolic pressure of 14 mmHg. The aortic pressure 130/58 with a mean of 86  mmHg. There was no pressure gradient across the aortic valve.   ANGIOGRAPHIC DATA:  LEFT VENTRICLE:  Left ventricular systolic function was  normal with an ejection fraction of 60-65% with no significant mitral  regurgitation.   RIGHT CORONARY ARTERY:  The right coronary artery is a moderate caliber  vessel that is smooth and normal.   LEFT MAIN:  The left main is short and is normal.   CIRCUMFLEX:  The circumflex is a large caliber vessel. It immediately  bifurcates to the distal circumflex and the large obtuse marginal one. It is  smooth and normal.   LAD:  The LAD is a large caliber vessel. It gives origin to a small to  moderate size diagonal 1 and a moderate to large diagonal 2. It is smooth  and normal.   IMPRESSION:  1.  Normal left ventricular systolic function, ejection fraction 60-65%.  2.  Normal left ventricular end diastolic pressure.  3.  Normal coronary arteries.  4.  No significant aortic regurgitation to explain angina pectoris by      hemodynamics. The aortic pressures were within normal range and the      diastolic pressure was normal again suggesting that the aortic      regurgitation if any is not significant.   RECOMMENDATIONS:  Evaluation for noncardiac cause of chest pain is  indicated.   A total of 45-55 mL of contrast was utilized for diagnostic angiography.   DESCRIPTION OF PROCEDURE:  In the usual sterile technique, using 6 French  right femoral artery access, a 6 [place] multipurpose B2 catheter was  advanced into the ascending aorta over a 0.035 inch J-wire.  The catheter  was  gently advanced to the left ventricle.  Left ventricular pressure was  monitored.  Hand contrast injection of the left ventricle was performed,  both in LAO and RAO projections.  The catheter was flushed with saline and  pulled back into the ascending aorta and pressure gradient across the aortic  valve was monitored. The right coronary artery was selectively engaged and  angiography was performed. In a similar fashion, the left main coronary  artery was selectively engaged and angiography was performed. Then the  catheter was pulled apart in the usual fashion. Right femoral angiography  was performed through the arterial axis sheath and the axis was closed with  Starclose with excellent hemostasis. The patient tolerated the procedure  well. No immediate complications noted.      Eden Lathe. Einar Gip, MD  Electronically Signed     JRG/MEDQ  D:  08/30/2005  T:  08/31/2005  Job:  [id number]   cc:   Quay Burow, M.D.  Fax: [phone]   Juanda Bond. Altheimer, M.D.  Fax: [phone]

## 2010-08-28 NOTE — Op Note (Signed)
[facility]  Patient:    Cindy [name], Cindy [name] Visit Number: [id number] MRN: [id number]          Service Type: GYN Location: [id number] Attending Physician:  Beaulah Dinning Dictated by:   Joaquin Bend, M.D. Proc. Date: 08/29/01 Admit Date:  08/29/2001                             Operative Report  PREOPERATIVE DIAGNOSIS:       Symptomatic cystocele and rectocele.  POSTOPERATIVE DIAGNOSIS:      Symptomatic cystocele and rectocele.  OPERATION:                    Anterior and posterior colporrhaphy and                               perineorrhaphy.  SURGEON:                      Joaquin Bend, M.D.  ASSISTANT:                    Maryclare Labrador, P.A.-C.  ANESTHESIA:                   Spinal and epidural - Heriberto Antigua, M.D.  ESTIMATED BLOOD LOSS:         100 cc.  COMPLICATIONS:                None.  Vaginal pack in place.  FINDINGS:                     Fourth degree cystocele and second degree rectocele.  INDICATIONS:                  A 77 year old post hysterectomy female with painful bulge in the vagina, protruding out through the introitus.  Found to be a cystocele, fourth degree with a second degree rectocele.  She is brought in for repair of this defect.  DESCRIPTION OF PROCEDURE:     The patient was taken to the operating room and given a spinal epidural by Dr. Ambrose Pancoast and placed in the dorsolithotomy position with the legs in the [place].  The perineum and vagina were sterilely prepped and draped.  A Foley catheter placed in her bladder.  An anterior vaginal prolapse was noted.  The apex of the vaginal cuff was noted to be fairly well-suspended.  Allis clamps grasped the apex and a dilute solution of 0.5% Xylocaine with epinephrine, 1:200,000, total of 20 cc was injected into the apex and the anterior vaginal wall.  An incision of the midline of the vagina to 2 cm below the urethral meatus was made.  Mucosa dissected off  the submucosa and bleeders were cauterized.  Pursestring sutures reduced the cystocele and then interrupted figure-of-eight sutures supported the bladder base.  The excess vaginal mucosa was trimmed and the mucosa was reapproximated using chromic suture.  A posterior repair was performed via excising an inverted triangle of perineal tissue, incising the posterior vaginal mucosa up to the vaginal apex.  The mucosa was dissected off the submucosa and bleeders cauterized.  There was 10 cc of the dilute 0.5% Xylocaine with epinephrine, 1:200,000 injected into the posterior mucosa prior to this procedure.  With the mucosas dissected off the submucosa, the rectocele was  reduced with figure-of-eight sutures of Vicryl, the excess mucosa trimmed.  A suture of Vicryl was placed to reapproximate the levator ani muscles.  The mucosa was reapproximated using chromic suture.  The perineum was reconstructed using running chromic suture in two layers with a subcuticular closing the skin. Vaginal packing of plain gauze with Estrace cream was placed.  Urine was clear and yellow at the end of the case.  Sponge and instrument counts were correct. Ancef 1 g was given before the start of the case.  The patient was transferred to the recovery area in stable condition. Dictated by:   Joaquin Bend, M.D. Attending Physician:  Beaulah Dinning DD:  08/29/01 TD:  08/31/01 Job: [id number]

## 2010-08-28 NOTE — H&P (Signed)
NAMEMEAGEN, VATH NO.:  [id number]   MEDICAL RECORD NO.:  [id number]                   PATIENT TYPE:  EMS   LOCATION:  ED                                   [facility]ACILITY[facility]  [facility]   PHYSICIAN:  Virgilio Belling. Ezzie Dural, MD             DATE OF BIRTH:  March 08, 1923   DATE OF ADMISSION:  06/30/2003  DATE OF DISCHARGE:                                HISTORY & PHYSICAL   HISTORY OF PRESENT ILLNESS:  Cindy [name] is a 77 year old white woman who  is admitted to [facility] for further evaluation of chest pain.   The patient was in the midst of eating her egg roll at a [employer]  this evening, when she experienced the sudden onset of bilateral sharp  shoulder pain.  This was soon accompanied by a vague pressure in the center  of her chest.  It was associated with nausea but no dyspnea or diaphoresis.  This ultimately brought her to the restroom, where she regurgitated her egg  roll and she soon began feeling better.  She and her husband then decided to  present to the emergency room.  By the time that she arrived at the  emergency department, her discomfort had largely resolved.  She is  asymptomatic and without chest or shoulder discomfort at this time.   There were no exacerbating or ameliorating factors.  The discomfort appeared  to be unrelated to position, activity, meals, or respirations.  She had  never experienced a similar such discomfort in the past.   The patient has no history of coronary artery disease, including no history  of chest pain or myocardial infarction.  She does have a history of a leaky  heart valve (details unknown) which is followed periodically by Dr. Quay [name].  It has not required any intervention.   The patient has a history of hypertension and dyslipidemia, both of which  are under treatment.  There is no history of diabetes mellitus or smoking.  There is no family history of early coronary artery disease.   PAST MEDICAL HISTORY:  The patient also has a history of hypothyroidism.   MEDICATIONS:  She is on a number of medications.  These include:  1. Aspirin 81 mg p.o. daily.  2. Metoprolol 50 mg p.o. (unclear if it is once a day or twice a day).  3. Simvastatin 40 mg p.o. daily.  4. Furosemide 20 mg p.o. daily.  5. Irbesartan 300 mg p.o. daily.  6. Levothyroxine 50 mcg p.o. daily.  7. Loratidine 10 mg p.o. daily.   ALLERGIES:  She is not allergic to any medications.   PAST SURGICAL HISTORY:  The patient has previously undergone a hysterectomy  as well as several operations on her toes.   SIGNIFICANT INJURIES:  None.   SOCIAL HISTORY:  The patient lives with her husband.  She does not drink.  She does  not work.   REVIEW OF SYSTEMS:  Review of systems reveals no problems related to her  head, eyes, ears, nose, mouth, throat, lungs, gastrointestinal system,  genitourinary system, or extremities.  There is no history of neurologic or  psychiatric disorder.  There is no history of fever, chills, or weight loss.   PHYSICAL EXAMINATION:  VITAL SIGNS:  Blood pressure 147/88.  Pulse 73 and  regular.  Respirations 20.  Temperature 98.1.  GENERAL:  The patient was an elderly white female in no discomfort.  She was  alert, oriented, appropriate, and responsive.  HEENT:  Head, eyes, nose, and mouth were normal.  NECK:  The neck was without thyromegaly or adenopathy.  Carotid pulses were  palpable bilaterally and without bruits.  CARDIAC:  Examination revealed a normal S1 and S2.  There was no S3, S4,  rub, or click.  There was a grade 2/6 systolic ejection murmur heard best at  the left sternal border.  Cardiac rhythm was regular.  No chest wall  tenderness was noted.  LUNGS:  The lungs were clear.  ABDOMEN:  The abdomen was soft and nontender.  There was no mass,  hepatosplenomegaly, bruit, distention, rebound, guarding, or rigidity.  Bowel sounds were normal.  BREAST, PELVIC AND RECTAL:   Examinations were not performed as they were not  pertinent to the reason for acute care hospitalization.  EXTREMITIES:  The extremities were without edema, deviation, or deformity.  Radial and dorsalis pedal pulses were palpable bilaterally.  NEUROLOGIC:  Brief screening neurologic survey was unremarkable.   LABORATORY AND ACCESSORY CLINICAL DATA:  The electrocardiogram revealed  normal sinus rhythm with left bundle branch block (the left bundle branch  block is not new).   The chest radiograph report was pending at the time of this dictation.   The initial CK was 49 with a CK-MB of 1.8 and troponin of less than 0.01.  Second CK was 51 with a CK-MB of 1.7 and troponin of less than 0.01.  White  count was 6.8 with a hemoglobin of 13.1 and hematocrit of 38.3.  Potassium  was 4.2, BUN 32, and creatinine 1.4.  The remaining studies were pending at  the time of this dictation.   IMPRESSION:  1. Atypical chest pain.  2. History of valvular heart disease; specifics unknown at this time.  3. Hypertension.  4. Dyslipidemia.  5. Hypothyroidism.   PLAN:  1. Telemetry.  2. Serial cardiac enzymes.  3. Aspirin.  4. Lovenox.  5. Nitrol paste.  6. Chest and abdominal CT scan tonight.  7. Further measures per Dr. Gwenlyn Found.   COMMENT:  The patient's husband was present throughout the patient  encounter.                                               Virgilio Belling. Ezzie Dural, MD    MSC/MEDQ  D:  06/30/2003  T:  07/01/2003  Job:  [id number]   cc:   Cindy [name], M.D.  Fax: [phone]

## 2010-08-28 NOTE — Op Note (Signed)
   Cindy [name], Cindy [name]                           ACCOUNT NO.:  [id number]   MEDICAL RECORD NO.:  [id number]                   PATIENT TYPE:  AMB   LOCATION:  DAY                                  FACILITY:  [facility]   PHYSICIAN:  Timothy E. Rosana Hoes, M.D.              DATE OF BIRTH:  Sep 12, 1922   DATE OF PROCEDURE:  12/19/2001  DATE OF DISCHARGE:                                 OPERATIVE REPORT   PREOPERATIVE DIAGNOSIS:  Right inguinal hernia.   POSTOPERATIVE DIAGNOSIS:  Right direct and indirect inguinal hernia.   PROCEDURE:  Repair of hernia with mesh.   SURGEON:  Timothy E. Rosana Hoes, M.D.   ANESTHESIA:  General.   CLINICAL NOTE:  The patient has a symptomatic right inguinal hernia that has  been partially incarcerated, causing abdominal pain and distention a couple  of times, and she now wishes to have a repair.  This has been completely  discussed with her and her husband.   DESCRIPTION OF PROCEDURE:  She was identified in the preop area, evaluated  by anesthesia, correct side marked, and permit signed.   She was taken to the operating room and placed supine, general endotracheal  anesthesia administered.  The right groin was prepped and draped in the  usual fashion.  Marcaine 0.25% with epinephrine was used throughout prior to  incision.  A short curvilinear incision made over the right groin palpable  defect.  The abundant subcutaneous fatty tissue dissected, bleeding points  cauterized, the external oblique identified, dissected free, opened in line  with the fibers to the external ring.  A large indirect hernia was present.  This was dissected bluntly and reduced back through the internal ring.  A  plug was placed, sutured in place, and a patch was placed over this and sewn  down to the surrounding normal inguinal floor with 2-0 Prolene.  This  completed the repair.  The wound was dry, counts were correct.  The external  oblique was closed with 2-0 Vicryl and the skin was closed  with 3-0  Monocryl.  Steri-Strips applied, all counts correct.  She tolerated it well  and was awakened and taken to the recovery room in good condition.   Written and verbal instructions were given, including Vicodin for pain, and  she will be seen and followed as an outpatient.                                               Timothy E. Rosana Hoes, M.D.    TED/MEDQ  D:  12/19/2001  T:  12/19/2001  Job:  [id number]   cc:   Juanda Bond. Altheimer, M.D.  [id number], [address]  [place] [address]  Fax: [phone]

## 2010-08-28 NOTE — H&P (Signed)
[facility]  Patient:    Cindy [name], Cindy [name] Visit Number: [id number] MRN: [id number]          Service Type: EMS Location: ED Attending Physician:  Gelene Mink Dictated by:   Joaquin Bend, M.D. Admit Date:  07/12/2001 Discharge Date: 07/12/2001                           History and Physical  ADMISSION DIAGNOSIS:          Symptomatic cystocele and rectocele.  OPERATION PLANNED:            Anterior and posterior colporrhaphy and perineorrhaphy.  HISTORY OF PRESENT ILLNESS:   The patient is a 77 year old post hysterectomy and female hormone replacement therapy with a protrusion through the vagina and found to be a cystocele and urethrocele which has been causing increased pain and pressure over the last year.  The patient has a small rectocele also, and is brought in for repair.  PAST MEDICAL HISTORY:         The patient has had a hysterectomy.  The patient has cardiovascular disease and followed by [facility].  A DEXA scan recently showing osteoporosis which is not progressive.  REVIEW OF SYSTEMS:            Otherwise negative for respiratory and negative for GI.  Negative for severe incontinence of dysuria.  Negative for neurological.  ALLERGIES:                    No known drug allergies.  FAMILY HISTORY:               The patient has a positive family history of heart disease, cancer, and hypertension.  SOCIAL HISTORY:               She is a nonsmoker.  Social alcohol and no recreational drug use.  PAST SURGICAL HISTORY:        The patient has also had some foot surgery.  PHYSICAL EXAMINATION:  VITAL SIGNS:                  Blood pressure is 140/80.  GENERAL:                      She is alert and oriented, normal mood and affect.  HEENT:                        Normal.  NECK:                         Without thyromegaly or JVD.  LUNGS:                        Clear to auscultation and  percussion.  HEART:                        Regular rate and rhythm.  BREASTS:                      Without masses or tenderness.  ABDOMEN:                      Soft and nontender.  BACK:  No CVA tenderness.  EXTREMITIES:                  Good pulses and no edema.  PELVIC:                       Examination shows normal EGBUS with some relaxation of the perineum, a cystourethrocele protruding the vagina, first degree rectocele, and a slight descent of the vaginal apex.  There are no adnexal masses.  Cervix and uterus are absent from previous surgery and the vaginal cuff was well-healed and the mucosa is pink and rugae.  Rectovaginal confirms rectocele without appreciable enterocele.  ADMISSION DIAGNOSIS:          Symptomatic cystourethrocele and small                               rectocele.  PLAN:                         The patient is admitted for anterior and posterior colporrhaphy and perineorrhaphy.  She accepts the risks and complications of bleeding, infection, recurrent prolapse, fistula formation, anesthetic complication, and drug reaction, and is willing to proceed. Dictated by:   Joaquin Bend, M.D. Attending Physician:  Gelene Mink DD:  08/28/01 TD:  08/29/01 Job: [id number]

## 2010-08-28 NOTE — Discharge Summary (Signed)
[facility]  Patient:    [name], Cindy Visit Number: [id number] MRN: [id number]          Service Type: GYN Location: [id number] Attending Physician:  Beaulah Dinning Dictated by:   Joaquin Bend, M.D. Admit Date:  08/29/2001 Discharge Date: 09/01/2001                             Discharge Summary  ADMISSION DIAGNOSIS:             Symptomatic cystocele/rectocele.  DISCHARGE DIAGNOSES:             1. Symptomatic cystocele/rectocele.                                  2. Postoperative urinary retention, resolved.  HISTORY AND HOSPITAL COURSE:     The patient is a 77 year old female post hysterectomy with painful pressure and protrusion through the introitus from a cystocele, urethrocele and some rectocele.  The patient was admitted and underwent an anterior and posterior colporrhaphy and perineorrhaphy on Aug 29, 2001.  The operation was uneventful.  The blood loss was 100 cc.  An indwelling urethra catheter was placed.  Postoperatively, she was stable. Some bradycardia postoperatively was thought due to the spinal and epidural medication that was given, this resolved with time and monitoring.  She had normal O2 saturations and a blood pressure of 124/44, minimal vaginal spotting, a postop hemoglobin of 11.4 from 14.0 with a white count of 11.1. The urethra catheter was removed and she was unable to void and had in-and-out catheterizations with high residuals, she was having minimal pain and she was afebrile.  Urinalysis showed no infection.  She was placed on Urecholine 10 mg t.i.d. which did help improve bladder function and decrease residuals.  She was also placed on Macrodantin 400 mg daily while she was being catheterized. By postoperative day #3 she was afebrile, she had better bladder emptying, she had voiding more than residuals and was discharged home in good condition on Sep 01, 2001 to continue Urecholine 10 mg t.i.d. and Macrobid  100 mg daily. She was also prescribed medication for pain and all home medications.  She was doing well.  She was given a discharge instruction sheet and will follow up in the office in 2 weeks. Dictated by:   Joaquin Bend, M.D. Attending Physician:  Beaulah Dinning DD:  09/21/01 TD:  09/23/01 Job: [id number]

## 2010-08-28 NOTE — Op Note (Signed)
   Cindy [name], Cindy [name]                            ACCOUNT NO.:  [id number]   MEDICAL RECORD NO.:  [id number]                   PATIENT TYPE:  AMB   LOCATION:  ENDO                                 FACILITY:  [facility]   PHYSICIAN:  John C. Amedeo Plenty, M.D.                 DATE OF BIRTH:  [birth year]   DATE OF PROCEDURE:  10/16/2002  DATE OF DISCHARGE:                                 OPERATIVE REPORT   PROCEDURE:  Colonoscopy.   INDICATION FOR PROCEDURE:  Colon cancer screening.   DESCRIPTION OF PROCEDURE:  The patient was placed in the left lateral  decubitus position and placed on the pulse monitor with continuous low-flow  oxygen delivered by nasal cannula.  She was sedated with 75 mcg IV fentanyl  and 6 mg IV Versed.  The Olympus video colonoscope was inserted into the  rectum and advanced to the cecum, confirmed by transillumination at  McBurney's point and visualization of the ileocecal valve and appendiceal  orifice.  The prep was excellent.  The cecum, ascending and transverse colon  all appeared normal with no masses, polyps, diverticula, or other mucosal  abnormalities.  Within the descending and sigmoid colon, there were seen  numerous scattered diverticula and no other abnormalities.  The scope was  then withdrawn and the patient returned to the recovery room in stable  condition.  She tolerated the procedure well, and there were no immediate  complications.   IMPRESSION:  1. Descending and sigmoid diverticulosis  2. Otherwise, normal study.   PLAN:  Next colon screening by sigmoidoscopy in five years.                                               John C. Amedeo Plenty, M.D.    JCH/MEDQ  D:  10/16/2002  T:  10/16/2002  Job:  [id number]

## 2010-08-28 NOTE — H&P (Signed)
NAMEFIYINFOLUWA, BRISCO NO.:  [id number]   MEDICAL RECORD NO.:  [id number]          PATIENT TYPE:  INP   LOCATION:  0101                         FACILITY:  [facility]   PHYSICIAN:  [name], MD DATE OF BIRTH:  11/03/1922   DATE OF ADMISSION:  08/29/2005  DATE OF DISCHARGE:                                HISTORY & PHYSICAL   Cindy [name] is a 77 year old white woman who is admitted to [facility] for further evaluation of chest pain.   The patient was in the midst of eating Chinese dinner when she experienced  the onset of chest pain.  This was described as a sharp discomfort  stretching across her anterior chest from shoulder to shoulder.  It radiated  to the back of her neck.  It was associated with diaphoresis, nausea and  vomiting but no dyspnea.  There were no exacerbating or ameliorating  factors.  It appeared not to be related to position, activity, meals or  respirations.  She presented to the emergency department at which point her  chest pain began to resolve.  It has largely resolved by this time.  The  total duration of chest pain was approximately one hour.  This is very  similar to an episode of chest pain also experienced while she was eating  [place] food which prompted her admission in March of 2005.  A myocardial  infarction was excluded at that time.   The patient has a history of mild aortic insufficiency.  There is no history  of coronary artery disease, myocardial infarction, congestive heart failure,  or arrhythmia.   The patient has a number or risk factors for coronary artery disease  including hypertension and dyslipidemia.  There is no history of diabetes  mellitus, smoking or family history of early coronary artery disease.   The patient's only other medical problem is hypothyroidism.   MEDICATIONS:  Calcium, vitamins, Estra-C, aspirin, folic acid, Fosamax,  furosemide, Levothyroxine, loratadine, metoprolol, Ocuvite and  simvastatin.   ALLERGIES:  None.   OPERATIONS:  1.  Hysterectomy.  2.  Several toe operations.  3.  Arthroscopy of the right knee to repair a medial meniscus tear.   SIGNIFICANT INJURIES:  None.   SOCIAL HISTORY:  The patient lives with her husband.  She does not smoke  cigarettes and she does not drink alcohol.  She does not work.   REVIEW OF SYSTEMS:  Reveals no new problems related to her head, eyes, ears,  nose, mouth, throat, lungs, gastrointestinal system, genitourinary system or  extremities.  There is no history of neurologic or psychiatric disorder.  There is no history of fever, chills, or weight loss.   PHYSICAL EXAMINATION:  Blood pressure 138/72, pulse 64 and regular,  respirations 16, temperature 97.7.  The patient was an elderly white woman  in no discomfort.  She was alert, oriented, appropriate and responsive.  Head, eyes, nose and mouth were normal.  The neck was without thyromegaly or  adenopathy.  Carotid pulses were palpable bilaterally and without bruits.  Cardiac examination revealed a normal S1 and  S2.  There was no S3, S4,  murmur, rub or click.  Cardiac rhythm was regular.  No chest wall tenderness  was noted.  The lungs were clear.  The abdomen was soft and nontender.  There was no mass, hepatosplenomegaly, bruit, distention, rebound, guarding  or rigidity.  Bowel sounds were normal.  Breasts, pelvic and rectal  examination were not performed as they were not pertinent to the reason for  acute care hospitalization.  The extremities were without edema, deviation  or deformity.  Radial and dorsalis pedis pulses were palpable bilaterally.  Brief screening neurologic survey was unremarkable.   The electrocardiogram revealed normal sinus rhythm with left bundle branch  block.  The chest radiograph demonstrated mild cardiomegaly, mild bibasilar  atelectasis (left greater than the right) and chronic elevation of the left  hemidiaphragm. The chest CT revealed  no evidence of a thoracic or upper  abdominal aortic dissection or aneurysm (without contrast), chronic  elevation of the left hemidiaphragm, chronic atelectasis in the left lower  lobe and a stable 9 mm right middle lobe lung nodule.  Potassium was 4.0,  BUN 28 and creatinine 1.6.  White count was 7.5 with a hemoglobin of 12.8  and a hematocrit 37.5.  The initial set of cardiac markers revealed a  myoglobin of 119, CK-MB of 1.7 and troponin less than 0.05.  The second set  of cardiac markers revealed a myoglobin of 99.7, CK-MB 1.7 and troponin less  than 0.05.  The third set of cardiac markers revealed a myoglobin of 78.5,  CK-MB of 1.7 and troponin less than 0.05.  The remaining studies were  pending at the time of this dictation.   IMPRESSION:  1.  Chest pain:  Rule out coronary artery disease.  2.  Mild aortic insufficiency.  3.  Hypertension.  4.  Dyslipidemia.  5.  Hypothyroidism.  6.  Mild renal insufficiency.   PLAN:  1.  Telemetry.  2.  Serial cardiac enzymes.  3.  Aspirin.  4.  Intravenous heparin.  5.  Intravenous nitroglycerin.  6.  Further measures per Dr. Gwenlyn Found.   This patient encounter was chaperoned by Charline Bills.      [name], MD  Electronically Signed     MSC/MEDQ  D:  08/29/2005  T:  08/30/2005  Job:  [id number]   cc:   Quay Burow, M.D.  Fax: [phone]

## 2010-08-28 NOTE — Op Note (Signed)
NAMEEVERLEANER, LINA                ACCOUNT NO.:  [id number]   MEDICAL RECORD NO.:  [id number]          PATIENT TYPE:  AMB   LOCATION:  DAY                          FACILITY:  [facility]   PHYSICIAN:  Gaynelle Arabian, M.D.    DATE OF BIRTH:  04-10-1923   DATE OF PROCEDURE:  10/21/2004  DATE OF DISCHARGE:                                 OPERATIVE REPORT   PREOPERATIVE DIAGNOSIS:  Right knee and medial meniscal tear.   POSTOPERATIVE DIAGNOSES:  1.  Right knee and medial meniscal tear.  2.  Lateral meniscal tear.   PROCEDURE:  Right knee arthroscopy with medial and lateral meniscal  debridement.   SURGEON:  Gaynelle Arabian, MD.   ASSISTANT:  None.   ANESTHESIA:  Local with MAC.   ESTIMATED BLOOD LOSS:  Minimal.   DRAINS:  None.   COMPLICATIONS:  None.   CONDITION:  Stable to recovery.   BRIEF CLINICAL NOTE:  Ms.Knoche is an 77 year old female with a long history  of right knee pain with mechanical symptoms. Her pain has gotten  progressively worse. She had a documented medial meniscal tear. She presents  now for arthroscopy with debridement.   PROCEDURE IN DETAIL:  After successful administration of local with MAC  anesthetic, the tourniquet was placed high on the right thigh and right  lower extremity prepped and draped in the usual sterile fashion. The portal  sites were marked and then the superomedial, inferomedial, and inferolateral  portal sites are infiltrated with a total of 20 mL of 1% Xylocaine plain. We  then made the superomedial and inferolateral incisions and the inflow  cannula was passed superomedial and camera passed inferolateral.  Arthroscopic visualization proceeds. The undersurface of the patella shows  grade 4 change. There is no focal loose cartilage. The trochlea is grade 4  medially and grade 3 and 4 laterally. Medial and lateral gutters are  visualized, there were no loose bodies. Flexion and valgus force was applied  to the knee and the medial  compartment is entered. She has a degenerative  tear of the body and posterior horn of the medial meniscus. The spinal  needle was used to localize the inferomedial portal, small incision made,  dilator placed and the meniscal tear is unstable. It is debrided back to a  stable base with baskets and a 4.2 mm shaver. She had some grade II change  on the medial femoral condyle but no full-thickness focal chondral defects  in the medial femoral condyle or medial tibial plateau.   The intercondylar notch was visualized, the ACL appears normal. The lateral  compartment is entered, there is a tear at the free edge of the body of the  lateral meniscus as well as posterior horn. This is debrided back to a  stable base with baskets and a 4.2 mm shaver. The arthroscopic equipment was  then removed the inferior portals which were closed with interrupted 4-0  nylon. 20 mL of 0.25% Marcaine with epi are injected through the inflow  cannula and that portal is removed and that cannula closed with nylon. A  bulky sterile  dressing is applied and she was subsequently awakened and  transported to recovery in stable condition.       FA/MEDQ  D:  10/21/2004  T:  10/21/2004  Job:  [id number]

## 2010-08-28 NOTE — Assessment & Plan Note (Signed)
[place]                             PULMONARY OFFICE NOTE   NALINA, WORK                         MRN:          [id number]  DATE:06/07/2006                            DOB:          January 03, 1923    PROBLEM:  Cough/bronchitis.   HISTORY:  Her cough is essentially resolved.  It had begun acutely and  she had been told to expect that it probably would resolve.  She tells  me that she has had an infection of the foot with concern for  osteomyelitis, and she has also been getting her wrist injected.  I had  given Tylenol with codeine which stopped her cough.  She is now on a  prolonged course of Levaquin and using Darvocet for pain.  She  understands that the Darvocet might be sufficient to suppress some of  her cough, but she denies any sense of chest congestion or shortness of  breath.   MEDICATIONS:  Her list was reviewed as charted.   OBJECTIVE:  Weight 144 pounds, BP 108/64, pulse regular 46, room air  saturation 98%.  There is a walking boot on her left foot and her left wrist is wrapped.  She seems alert, cheerful and talkative with no cough or dyspnea  evident.  CHEST:  Quiet without rales, rhonchi or wheeze.  Heart sounds are  regular.  I do not hear a murmur.   IMPRESSION:  Cough with bronchitis, apparently resolved, at least now  while on Levaquin and Darvocet.  This was probably a postviral  bronchitis, but we will not know until she gets off of her medications.   PLAN:  Return p.r.n.     Clinton D. Annamaria Boots, MD, [place], [place]  Electronically Signed    CDY/MedQ  DD: 06/07/2006  DT: 06/08/2006  Job #: [id number]   cc:   Juanda Bond. Altheimer, M.D.  Weber Cooks, M.D.

## 2010-08-28 NOTE — Discharge Summary (Signed)
Cindy [name], Cindy [name] NO.:  [id number]   MEDICAL RECORD NO.:  [id number]          PATIENT TYPE:  INP   LOCATION:  6525                         FACILITY:  [place]   PHYSICIAN:  Bryson [name], M.D.DATE OF BIRTH:  1922/10/28   DATE OF ADMISSION:  08/30/2005  DATE OF DISCHARGE:  08/31/2005                                 DISCHARGE SUMMARY   Cindy [name], an 77 year old woman, was admitted to Cindy [name] for  evaluation of chest pain.  The patient was in the midst of eating Chinese  dinner when she experienced onset of chest pain, and it was sharp discomfort  across the anterior chest from shoulder to shoulder, radiated to the back of  her neck, associated with nausea and vomiting but no dyspnea.  The patient  also has complaint of diaphoresis.  She was delivered to Cindy [name]  and admitted on rule-out MI protocol.   We cycled her cardiac enzymes and all three sets of them were negative.  She  also underwent CT of her chest that showed chronic elevation of the left  hemidiaphragm with chronic atelectasis of the left lower lobe, 3.9 mm right  middle lobe lung nodule, which is stable dating back to March 2007, and this  is therefore consistent with a benign granuloma or  hamartoma.  The CT did  not reveal any acute cardiopulmonary process, and there was no suggestion of  aortic dissection or aneurysm.   The patient underwent cardiac catheterization the same day of her admission.  The catheterization was performed by Cindy [name] and it revealed essentially  normal coronaries with EF of 65% and no significant aortic insufficiency to  explain her angina symptoms.  For the full description of this  catheterization, please refer to a dictated note by Cindy [name].   The next morning the patient was assessed by Cindy [name] and was thought to  be in stable condition for discharge home.  Her groin did not have any signs  of hematoma or oozing.  Lungs were clear and  heart sounds were normal.  She  did not have any complaints of any chest pain.   [name]:  The white blood cell count was 8.3, hemoglobin 11.8,  hematocrit 34.6, platelet count 126.  Sodium 140, potassium 3.9, BUN 18,  creatinine 1.1, glucose 133.   DISCHARGE DIAGNOSES:  1.  Chest pain, etiology unclear, status post catheterization on Aug 30, 2005, by Cindy [name], normal coronaries.  2.  Hypertension.  3.  Dyslipidemia.  4.  Hypothyroid.  5.  Right middle lobe lung nodule, stable.   DISCHARGE MEDICATIONS:  1.  Prevacid 30 mg daily.  2.  Aspirin 81 mg daily.  3.  Vitamin D, vitamin C, vitamin B12, vitamin E and multivitamin as before.  4.  Folic acid [id number] mg daily.  5.  Synthroid 50 mcg daily.  6.  Lopressor 25 mg b.i.d.  7.  Simvastatin 40 mg daily.  8.  Irbesartan 300 mg daily.   DISCHARGE ACTIVITY:  She is not allowed to drive or  lift weights greater  than five pounds for three days post catheterization.  Increase activity  slowly.   DISCHARGE DIET:  Low-fat, low-salt, low-cholesterol diet.   DISCHARGE FOLLOW-UP:  Cindy [name] will see the patient in our office on September 15, 2005, at 11 a.m., and our office will give the patient a referral to  Cindy [name]. Benson Norway, M.D., gastroenterologist, and she will be called with  information about appointment date and time with Dr. Benson Norway.      York Grice, P.A.    ______________________________  Bryson [name], M.D.    MK/MEDQ  D:  08/31/2005  T:  08/31/2005  Job:  [id number]   cc:   Cindy [name]. Benson Norway, MD  Fax: [phone]

## 2010-08-28 NOTE — Assessment & Plan Note (Signed)
[facility]                             PULMONARY OFFICE NOTE   SEANNE, FETTERS                         MRN:          [id number]  DATE:05/10/2006                            DOB:          December 18, 1922    PROBLEM:  This is an 77 year old woman known to me because she  accompanies her husband, a patient here. She is seen at the kind request  of [name] for complaint of cough.   HISTORY:  She says this cough is a new problem for her over the past 3  months. It began with chest congestion and she may have had a cold. She  went to a walk-in clinic and says chest x-ray then at [facility]  Radiology was clear and spirometry was also clear. A sputum culture  she said was normal. She was put on an albuterol inhaler, Medrol  Dosepak, Avelox and benzonatate perles. She does not think any of this  helped although she admits her cough is milder now than it was. The  cough still wakes her and may be aggravated by cold air. She had a GI  evaluation by Dr. Benson [place] demonstrating a hiatal hernia. She is aware of  some post nasal drainage but not definitely aware of reflux or  aspiration.   MEDICATIONS:  1. Multivitamins.  2. Loratadine.  3. Fosamax.  4. Aspirin 81 mg.  5. Furosemide 20 mg.  6. Simvastatin 40 mg.  7. Losartan 100 mg.  8. Synthroid.  9. Omeprazole 20 mg.  10.Metoprolol 50 mg.  11.Ipratropium rescue inhaler.  12.Saline nasal spray.   DRUG INTOLERANCE:  SULFA.   REVIEW OF SYSTEMS:  Occasionally aware of shortness of breath while  walking but not at rest. Rhinorrhea usually watery, sometimes thick and  yellow. Postnasal drainage which is a chronic pattern for her. She does  not recognize a change to explain the cough. She may choke a little  while she is swallowing thin liquids. There is no chest pain or  palpitation, no obvious wheeze, sometimes she sneezes. She denies  exertional chest pain or palpitation, joint pain or swelling,  ankle  edema, fever, sweat or bleeding.   PAST MEDICAL HISTORY:  Hypertension, elevated cholesterol, melanoma  resected from the left arm and in remission, minor leaky valve  according to Dr. Quay Burow. Allergic rhinitis treated with allergy  vaccine by Dr. Caprice Red. Esophageal reflux evaluated by Dr. Benson [place].  Past surgery for  hysterectomy.   SOCIAL HISTORY:  Never smoked, married, lives with husband, retired from  office work. They have a disabled child.   FAMILY HISTORY:  Positive for allergies, heart disease and cancer. She  is not specifically aware of lung disease in the family.   OBJECTIVE:  Weight 145 pounds, blood pressure 106/64, pulse regular 52,  room air saturation 98%. Mild dry cough, unlabored breathing, no  distress.  SKIN:  No bruising or rash.  ADENOPATHY:  None at the neck or shoulders.  HEENT:  Nasal airway is clear, oropharynx is clear, voice quality is  normal. There is no stridor or neck  vein distention.  CHEST:  Dry cough, no wheeze, no dullness.  ABDOMEN:  No edema, cyanosis or clubbing.   IMPRESSION:  Cough with onset 3 weeks ago probably with a viral illness.  I can not definitely identify sinusitis or critical levels of reflux  although both of these may be present and contributory.   PLAN:  1. Tylenol #3 one p.o. q.6 h p.r.n. for cough (she says she cannot      tolerate liquids and refluxes them).  2. Nebulizer treatment here Xopenex 0.63.  3. Depo-Medrol 80 mg IM with steroid talk.  4. Chest x-ray.  5. Schedule return in 1 month, earlier p.r.n.   I appreciate the chance to meet her.     Clinton D. Annamaria Boots, MD, [place] [facility], [place]  Electronically Signed    CDY/MedQ  DD: 05/10/2006  DT: 05/11/2006  Job #: [id number]   cc:   Juanda Bond. Altheimer, M.D.  Jeanie Cooks, M.D.

## 2010-08-28 NOTE — Op Note (Signed)
Cindy [name], Cindy [name] NO.:  [id number]   MEDICAL RECORD NO.:  [id number]                   PATIENT TYPE:  OIB   LOCATION:  5005                                 FACILITY:  [facility]   PHYSICIAN:  Weber Cooks, M.D.                  DATE OF BIRTH:  08-25-1922   DATE OF PROCEDURE:  07/02/2002  DATE OF DISCHARGE:                                 OPERATIVE REPORT   PREOPERATIVE DIAGNOSES:  1. Left tight Achilles tendon.  2. Left second through fourth acquired hammertoes.   POSTOPERATIVE DIAGNOSES:  1. Left tight Achilles tendon.  2. Left second through fourth acquired hammertoes.   OPERATIONS:  1. Left percutaneous Achilles tendon lengthening.  2. Left second through fourth toes metatarsophalangeal joint dorsal     capsulotomy with collateral release.  3. Left second through fourth toes proximal phalanx head resections.  4. Left second through fourth toes flexor digitorum longus to proximal     phalanx transfers.  5. Left second through fourth toes extensor digitorum brevis to extensor     digitorum longus transfers.   ANESTHESIA:  General endotracheal tube.   SURGEON:  Weber Cooks, M.D.   ASSISTANT:  Gerri Lins, P.A.   BLOOD LOSS:  Minimal.   TOURNIQUET TIME:  Approximately one hour.   COMPLICATIONS:  None.   DISPOSITION:  Stable to PAR.   INDICATION:  This is a 77 year old female who has had long-standing left  forefoot pain that was resistant to conservative management.  She was  consented for the above procedure.  All risks which include infection, nerve  and vessel injury, persistent pain, worsening of pain, recurrence of the  deformity, ischemia in the toe with amputation, were all explained.  Questions were encouraged and answered.   DESCRIPTION OF OPERATION:  The patient was brought to the operating room and  placed in the supine position.  After adequate general endotracheal tube  anesthesia was administered, as well as Ancef  1 g IV piggyback, the left  lower extremity was then prepped and draped in a sterile manner over a  proximally-placed thigh tourniquet.  We started the procedure with two  medial and one lateral hemisection of the Achilles tendon.  This had  excellent release of the tight Achilles tendon.  We then gravity  exsanguinated the left lower extremity, and the tourniquet was elevated to  290 mmHg.  A longitudinal incision was then made over the second toe.  Dissection was carried down to the extensor tendons.  The extensor digitorum  longus was tenotomized proximal-distal and the brevis distal-lateral.  The  PIP joint was then entered, and the proximal phalanx head was then removed  with the aid of a rongeur and a bone cutter.  The distal aspect was  skeletonized.  We the entered the dorsal aspect of the MTP joint, and the  MTP joint was released as well  as the collateral ligaments both medially and  laterally.  We then made a longitudinal incision through the plantar plate  of the PIP joint.  The FDL tendon was then identified and was tenotomized as  distal as possible.  Then with a 3.5-mm drill, a drill hole was then made in  the base of the proximal phalanx.  Then with 3-0 PDS suture, the FDL tendon  was then transferred through the drill hole in the proximal phalanx.  Double-  ended trocar 0.045 K-wire was then placed antegrade through the middle and  distal phalanx and then retrograde through the proximal phalanx.  The toe  was then held in reduced position with the ankle in neutral dorsiflexion and  tension on the FDL tendon.  The K-wire was then passed across the MTP joint.  With the toe held in the proper position, the EDB-to-EDL tendons were then  transferred to one another with the 3-0 PDS to the FDL tendon that was  transferred through the base of the proximal phalanx.  The exact same  procedure was done through a separate incision for the third and fourth  toes, respectively.  Once the  procedure was completed, the tourniquet was  deflated. Hemostasis was obtained.  The skin relieving incisions were made  over each individual incision.  K-wires were bent, cut, and capped.  The  skin was closed with 4-0 nylon.  Sterile dressing was applied.  The toes  were also pink at the end of the procedure with capillary refill less than  two seconds in all toes with bleeding out the tips of the K-wires as well.  Once the sterile dressing applied, CamWalker boot was applied.  The patient  was stable to the PAR.                                               Weber Cooks, M.D.    PB/MEDQ  D:  07/02/2002  T:  07/02/2002  Job:  [id number]

## 2010-11-10 ENCOUNTER — Other Ambulatory Visit (HOSPITAL_COMMUNITY): Payer: Self-pay | Admitting: Oncology

## 2010-11-10 ENCOUNTER — Encounter (HOSPITAL_BASED_OUTPATIENT_CLINIC_OR_DEPARTMENT_OTHER): Payer: Medicare Other | Admitting: Oncology

## 2010-11-10 DIAGNOSIS — C4360 Malignant melanoma of unspecified upper limb, including shoulder: Secondary | ICD-10-CM

## 2010-11-10 LAB — CBC WITH DIFFERENTIAL/PLATELET
BASO%: 0.5 % (ref 0.0–2.0)
Basophils Absolute: 0 10*3/uL (ref 0.0–0.1)
EOS%: 6.5 % (ref 0.0–7.0)
Eosinophils Absolute: 0.5 10*3/uL (ref 0.0–0.5)
HCT: 37 % (ref 34.8–46.6)
HGB: 12.6 g/dL (ref 11.6–15.9)
LYMPH%: 22.2 % (ref 14.0–49.7)
MCH: 32.3 pg (ref 25.1–34.0)
MCHC: 33.9 g/dL (ref 31.5–36.0)
MCV: 95.1 fL (ref 79.5–101.0)
MONO#: 0.7 10*3/uL (ref 0.1–0.9)
MONO%: 8.6 % (ref 0.0–14.0)
NEUT#: 4.7 10*3/uL (ref 1.5–6.5)
NEUT%: 62.2 % (ref 38.4–76.8)
Platelets: 275 10*3/uL (ref 145–400)
RBC: 3.89 10*6/uL (ref 3.70–5.45)
RDW: 14.4 % (ref 11.2–14.5)
WBC: 7.6 10*3/uL (ref 3.9–10.3)
lymph#: 1.7 10*3/uL (ref 0.9–3.3)

## 2010-11-10 LAB — COMPREHENSIVE METABOLIC PANEL
ALT: 17 U/L (ref 0–35)
AST: 23 U/L (ref 0–37)
Albumin: 3.7 g/dL (ref 3.5–5.2)
Alkaline Phosphatase: 57 U/L (ref 39–117)
BUN: 30 mg/dL — ABNORMAL HIGH (ref 6–23)
CO2: 28 mEq/L (ref 19–32)
Calcium: 10.1 mg/dL (ref 8.4–10.5)
Chloride: 104 mEq/L (ref 96–112)
Creatinine, Ser: 1.21 mg/dL — ABNORMAL HIGH (ref 0.50–1.10)
Glucose, Bld: 91 mg/dL (ref 70–99)
Potassium: 3.5 mEq/L (ref 3.5–5.3)
Sodium: 140 mEq/L (ref 135–145)
Total Bilirubin: 0.3 mg/dL (ref 0.3–1.2)
Total Protein: 7.6 g/dL (ref 6.0–8.3)

## 2010-11-10 LAB — LACTATE DEHYDROGENASE: LDH: 208 U/L (ref 94–250)

## 2011-01-26 LAB — POCT HEMOGLOBIN-HEMACUE
Hemoglobin: 14.1
Operator id: 128471

## 2011-01-27 LAB — BASIC METABOLIC PANEL
BUN: 26 — ABNORMAL HIGH
CO2: 29
Calcium: 9.5
Chloride: 105
Creatinine, Ser: 1.54 — ABNORMAL HIGH
GFR calc Af Amer: 39 — ABNORMAL LOW
GFR calc non Af Amer: 32 — ABNORMAL LOW
Glucose, Bld: 124 — ABNORMAL HIGH
Potassium: 4.4
Sodium: 141

## 2011-05-20 ENCOUNTER — Telehealth: Payer: Self-pay | Admitting: Oncology

## 2011-05-20 NOTE — Telephone Encounter (Signed)
pt called for this yr appt,per mosaiq july appt made   aom

## 2011-06-07 DIAGNOSIS — K219 Gastro-esophageal reflux disease without esophagitis: Secondary | ICD-10-CM | POA: Insufficient documentation

## 2011-06-07 DIAGNOSIS — Z8719 Personal history of other diseases of the digestive system: Secondary | ICD-10-CM | POA: Insufficient documentation

## 2011-06-14 DIAGNOSIS — M2060 Acquired deformities of toe(s), unspecified, unspecified foot: Secondary | ICD-10-CM | POA: Insufficient documentation

## 2011-06-16 DIAGNOSIS — R918 Other nonspecific abnormal finding of lung field: Secondary | ICD-10-CM | POA: Insufficient documentation

## 2011-06-23 ENCOUNTER — Other Ambulatory Visit: Payer: Self-pay | Admitting: Dermatology

## 2011-08-03 ENCOUNTER — Other Ambulatory Visit: Payer: Self-pay | Admitting: Anesthesiology

## 2011-08-03 DIAGNOSIS — R9389 Abnormal findings on diagnostic imaging of other specified body structures: Secondary | ICD-10-CM

## 2011-08-04 ENCOUNTER — Ambulatory Visit
Admission: RE | Admit: 2011-08-04 | Discharge: 2011-08-04 | Disposition: A | Discharge status: Home / Self Care | Payer: Medicare Other | Source: Ambulatory Visit | Attending: Anesthesiology | Admitting: Anesthesiology

## 2011-08-04 DIAGNOSIS — R9389 Abnormal findings on diagnostic imaging of other specified body structures: Secondary | ICD-10-CM

## 2011-08-04 MED ORDER — IOHEXOL 300 MG/ML  SOLN
100.0000 mL | Freq: Once | INTRAMUSCULAR | Status: AC | PRN
  Administered 2011-08-04: 100 mL via INTRAVENOUS

## 2011-10-19 ENCOUNTER — Other Ambulatory Visit (HOSPITAL_BASED_OUTPATIENT_CLINIC_OR_DEPARTMENT_OTHER): Payer: Medicare Other | Admitting: Lab

## 2011-10-19 ENCOUNTER — Encounter: Payer: Self-pay | Admitting: Oncology

## 2011-10-19 ENCOUNTER — Telehealth: Payer: Self-pay | Admitting: Oncology

## 2011-10-19 ENCOUNTER — Ambulatory Visit (HOSPITAL_BASED_OUTPATIENT_CLINIC_OR_DEPARTMENT_OTHER): Payer: Medicare Other | Admitting: Oncology

## 2011-10-19 VITALS — BP 157/74 | HR 65 | Temp 97.0°F | Wt 140.3 lb

## 2011-10-19 DIAGNOSIS — M810 Age-related osteoporosis without current pathological fracture: Secondary | ICD-10-CM

## 2011-10-19 DIAGNOSIS — C4360 Malignant melanoma of unspecified upper limb, including shoulder: Secondary | ICD-10-CM

## 2011-10-19 DIAGNOSIS — C4362 Malignant melanoma of left upper limb, including shoulder: Secondary | ICD-10-CM

## 2011-10-19 DIAGNOSIS — R911 Solitary pulmonary nodule: Secondary | ICD-10-CM

## 2011-10-19 DIAGNOSIS — Z803 Family history of malignant neoplasm of breast: Secondary | ICD-10-CM

## 2011-10-19 LAB — CBC WITH DIFFERENTIAL/PLATELET
BASO%: 0.4 % (ref 0.0–2.0)
Basophils Absolute: 0 10*3/uL (ref 0.0–0.1)
EOS%: 1.2 % (ref 0.0–7.0)
Eosinophils Absolute: 0.1 10*3/uL (ref 0.0–0.5)
HCT: 37.7 % (ref 34.8–46.6)
HGB: 12.7 g/dL (ref 11.6–15.9)
LYMPH%: 11.5 % — ABNORMAL LOW (ref 14.0–49.7)
MCH: 32.2 pg (ref 25.1–34.0)
MCHC: 33.7 g/dL (ref 31.5–36.0)
MCV: 95.4 fL (ref 79.5–101.0)
MONO#: 0.2 10*3/uL (ref 0.1–0.9)
MONO%: 2.6 % (ref 0.0–14.0)
NEUT#: 6.2 10*3/uL (ref 1.5–6.5)
NEUT%: 84.3 % — ABNORMAL HIGH (ref 38.4–76.8)
Platelets: 248 10*3/uL (ref 145–400)
RBC: 3.95 10*6/uL (ref 3.70–5.45)
RDW: 13.8 % (ref 11.2–14.5)
WBC: 7.3 10*3/uL (ref 3.9–10.3)
lymph#: 0.8 10*3/uL — ABNORMAL LOW (ref 0.9–3.3)

## 2011-10-19 LAB — COMPREHENSIVE METABOLIC PANEL
ALT: 13 U/L (ref 0–35)
AST: 21 U/L (ref 0–37)
Albumin: 3.4 g/dL — ABNORMAL LOW (ref 3.5–5.2)
Alkaline Phosphatase: 47 U/L (ref 39–117)
BUN: 27 mg/dL — ABNORMAL HIGH (ref 6–23)
CO2: 20 mEq/L (ref 19–32)
Calcium: 9.8 mg/dL (ref 8.4–10.5)
Chloride: 107 mEq/L (ref 96–112)
Creatinine, Ser: 1.15 mg/dL — ABNORMAL HIGH (ref 0.50–1.10)
Glucose, Bld: 117 mg/dL — ABNORMAL HIGH (ref 70–99)
Potassium: 4.7 mEq/L (ref 3.5–5.3)
Sodium: 139 mEq/L (ref 135–145)
Total Bilirubin: 0.3 mg/dL (ref 0.3–1.2)
Total Protein: 7.2 g/dL (ref 6.0–8.3)

## 2011-10-19 LAB — LACTATE DEHYDROGENASE: LDH: 203 U/L (ref 94–250)

## 2011-10-19 NOTE — Telephone Encounter (Signed)
APPTS MADE AND PRINTED FOR PT AOM °

## 2011-10-19 NOTE — Progress Notes (Signed)
This office note has been dictated.  [id number]

## 2011-10-19 NOTE — Progress Notes (Signed)
CC:   Cindy [name], M.D. Smith Mince, M.D. Juanda Bond. Altheimer, M.D.  PROBLEM LIST: 1. Superficial spreading malignant melanoma 1.16 mm Breslow's     measurement and Clark's level IV located on the left upper lateral     arm with biopsy on 11/12/2004.  There was no ulceration.  Re-     excision and a sentinel lymph node biopsy revealed negative margins     and no tumor in the 1 lymph node that was removed on 12/02/2004.     Pathologic stage was T2a N0, stage IB. 2. Hiatal hernia. 3. Systolic ejection murmur. 4. Diverticulosis. 5. Gastroesophageal reflux disease. 6. Hypertension. 7. Osteoporosis with compression fractures. 8. Dyslipidemia. 9. Hypothyroidism. 10.Macular degeneration. 11.Right middle lobe pulmonary nodule. 12.Right middle lobe lung nodule. 13.Positive family history of breast cancer in the patient's daughter     and maternal aunt with apparent negative genetic testing in the     patient's daughter.  MEDICATIONS: 1. Fosamax 70 mg every 7 days. 2. Aspirin 81 mg daily. 3. Calcium carbonate 600 mg twice daily. 4. Cyanocobalamin 1000 mg daily. 5. Ergocalciferol 1000 units daily. 6. Folic acid [id number] mcg daily. 7. Lasix 20 mg daily. 8. Glucosamine chondroitin 500/400 one tablet twice daily. 9. Vicodin 5/500 one daily as needed. 10.Atrovent inhaler 1 puff as needed. 11.Levothyroxine 75 mcg daily. 12.Claritin 10 mg daily. 13.Toprol-XL 50 mg daily. 14.Multivitamins 1 daily. 15.Multivitamins with minerals 1 daily. 16.Prilosec 20 mg daily. 17.Zocor 40 mg every evening.  HISTORY:  Cindy [name] was seen today for followup of her pT2a pN0, IB superficial spreading malignant melanoma involving the left upper lateral arm with negative sentinel lymph node and clear margins dating back to August 2006.  Cindy [name] was last seen by [place] on 11/10/2010.  She is now is 78 years old and overall health seems to be excellent.  She is quite mentally sharp, drives, is  self-sufficient.  Her husband has had some health problems lately.  The patient denies any symptoms to suggest recurrence of her melanoma.  She has had some problems with her back, apparently some compression fractures, and today had an epidural injection.  She has been followed by Dr. Girard Cooter at [facility] for a lung nodule that apparently has a low likelihood for malignancy.  The patient tells me that her daughter, [name], had genetic testing at [facility] for breast cancer and these tests were negative.  The patient's major concern today is that she is losing her hair.  She will be seeing her dermatologist, Dr. Pamala Duffel, within the next week or two.  PHYSICAL EXAMINATION:  The patient is a very spry, energetic woman of 55.  Weight is stable at 140.3 pounds.  Blood pressure 157/74.  Other vital signs are normal.  There is no scleral icterus.  Mouth and pharynx benign.  No peripheral adenopathy palpable in the neck or axillary areas.  Lungs:  Clear to percussion and auscultation.  Cardiac:  Regular rhythm with systolic ejection murmur.  Breasts were not examined.  The patient has yearly mammograms and is actually due for mammograms this month.  Abdomen:  Benign with no organomegaly or masses palpable. Extremities:  No peripheral edema.  The site of the melanoma on the left lateral posterior arm is well-healed without any suspicious findings. The patient has some purpura over her arms.  Neurologic:  Normal.  LABORATORY DATA:  Today, white count 7.3, ANC 6.2, hemoglobin 12.7, hematocrit 37.7, platelets 248,000.  Chemistries today notable for a BUN of 27, creatinine 1.15, albumin 3.4.  IMAGING STUDIES: 1. CT angiogram of the chest with IV contrast on 10/18/2009 showed no     CT findings for pulmonary embolism.  There was a large hiatal     hernia.  There was left lower lobe atelectasis.  There was a 14 mm     right middle lobe pulmonary nodule  that had increased in size since     2007 when it measured 9 mm. 2. MRI of the right knee without contrast was negative for fracture or     other acute abnormalities.  There was extensive and progressive     tearing of the posterior horn and body of the medial meniscus.     There was a small oblique tear in the anterior horn of the lateral     meniscus and there was tricompartmental degenerative change. 3. CT scan of abdomen and pelvis with IV contrast on 08/04/2011 showed     no acute abnormality of the abdomen and pelvis.  Diverticulosis was     present along with extensive chronic degenerative changes in the     lower lumbar spine with fairly severe spinal stenosis at L4-5. 4. CT scan of the chest with IV contrast on 08/04/2011 showed no acute     abnormalities.  There was chronic elevation of the left     hemidiaphragm with secondary compressive atelectasis.  There was a     stable nodule in the region of the inferior aspect of the right     hilum.  IMPRESSION AND PLAN:  Cindy [name] is now out 7 years from the time of diagnosis of her thin melanoma involving the left upper arm.  There was no evidence for recurrence of disease.  She is doing quite well with no major ongoing health issues at this time.  We will plan to see Cindy [name] again in 1 year at which time we will check CBC and chemistries.    ______________________________ Jeanie Cooks, M.D. DSM/MEDQ  D:  10/19/2011  T:  10/19/2011  Job:  [id number]

## 2011-11-11 DIAGNOSIS — M25519 Pain in unspecified shoulder: Secondary | ICD-10-CM | POA: Insufficient documentation

## 2012-06-17 ENCOUNTER — Encounter (HOSPITAL_COMMUNITY): Payer: Self-pay | Admitting: Cardiology

## 2012-06-17 ENCOUNTER — Emergency Department (HOSPITAL_COMMUNITY)
Admission: EM | Admit: 2012-06-17 | Discharge: 2012-06-17 | Disposition: A | Discharge status: Home / Self Care | Payer: Medicare Other | Attending: Emergency Medicine | Admitting: Emergency Medicine

## 2012-06-17 ENCOUNTER — Emergency Department (HOSPITAL_COMMUNITY): Payer: Medicare Other

## 2012-06-17 DIAGNOSIS — Z8669 Personal history of other diseases of the nervous system and sense organs: Secondary | ICD-10-CM | POA: Insufficient documentation

## 2012-06-17 DIAGNOSIS — Z85820 Personal history of malignant melanoma of skin: Secondary | ICD-10-CM | POA: Insufficient documentation

## 2012-06-17 DIAGNOSIS — R011 Cardiac murmur, unspecified: Secondary | ICD-10-CM | POA: Insufficient documentation

## 2012-06-17 DIAGNOSIS — R42 Dizziness and giddiness: Secondary | ICD-10-CM | POA: Insufficient documentation

## 2012-06-17 DIAGNOSIS — Z7982 Long term (current) use of aspirin: Secondary | ICD-10-CM | POA: Insufficient documentation

## 2012-06-17 DIAGNOSIS — E785 Hyperlipidemia, unspecified: Secondary | ICD-10-CM | POA: Insufficient documentation

## 2012-06-17 DIAGNOSIS — Y9289 Other specified places as the place of occurrence of the external cause: Secondary | ICD-10-CM | POA: Insufficient documentation

## 2012-06-17 DIAGNOSIS — S0101XA Laceration without foreign body of scalp, initial encounter: Secondary | ICD-10-CM

## 2012-06-17 DIAGNOSIS — Z8719 Personal history of other diseases of the digestive system: Secondary | ICD-10-CM | POA: Insufficient documentation

## 2012-06-17 DIAGNOSIS — I10 Essential (primary) hypertension: Secondary | ICD-10-CM | POA: Insufficient documentation

## 2012-06-17 DIAGNOSIS — Z79899 Other long term (current) drug therapy: Secondary | ICD-10-CM | POA: Insufficient documentation

## 2012-06-17 DIAGNOSIS — Z8739 Personal history of other diseases of the musculoskeletal system and connective tissue: Secondary | ICD-10-CM | POA: Insufficient documentation

## 2012-06-17 DIAGNOSIS — W010XXA Fall on same level from slipping, tripping and stumbling without subsequent striking against object, initial encounter: Secondary | ICD-10-CM | POA: Insufficient documentation

## 2012-06-17 DIAGNOSIS — K219 Gastro-esophageal reflux disease without esophagitis: Secondary | ICD-10-CM | POA: Insufficient documentation

## 2012-06-17 DIAGNOSIS — S0100XA Unspecified open wound of scalp, initial encounter: Secondary | ICD-10-CM | POA: Insufficient documentation

## 2012-06-17 DIAGNOSIS — E039 Hypothyroidism, unspecified: Secondary | ICD-10-CM | POA: Insufficient documentation

## 2012-06-17 DIAGNOSIS — Y9329 Activity, other involving ice and snow: Secondary | ICD-10-CM | POA: Insufficient documentation

## 2012-06-17 DIAGNOSIS — W19XXXA Unspecified fall, initial encounter: Secondary | ICD-10-CM

## 2012-06-17 DIAGNOSIS — W1809XA Striking against other object with subsequent fall, initial encounter: Secondary | ICD-10-CM | POA: Insufficient documentation

## 2012-06-17 NOTE — ED Provider Notes (Signed)
History     CSN: [id number]  Arrival date & time 06/17/12  [id number]   First MD Initiated Contact with Patient 06/17/12 0935      Chief Complaint  Patient presents with  . Fall  . Dizziness    (Consider location/radiation/quality/duration/timing/severity/associated sxs/prior treatment) Patient is a 79 y.o. female presenting with fall. The history is provided by the patient.  Fall The accident occurred less than 1 hour ago. Associated symptoms include headaches. Pertinent negatives include no numbness, no abdominal pain, no nausea and no vomiting.   patient was outside in her driveway and slipped on the ice and fell backwards hitting her head. No loss of conscious. She states that she was dizzy after the event but not before. No numbness or weakness. No confusion. No abdominal pain or chest pain. No extremity pain except for a mild chronic right shoulder pain. She's not on anticoagulation  Past Medical History  Diagnosis Date  . GERD (gastroesophageal reflux disease)   . Hypertension   . Osteoporosis   . Malignant melanoma 11/12/04    L upper arm  . Hiatal hernia   . Murmur, cardiac   . Dyslipidemia   . Hypothyroidism   . Macular degeneration   . Lung nodule 07/01/03    right middle lobe    Past Surgical History  Procedure Laterality Date  . Total abdominal hysterectomy w/ bilateral salpingoophorectomy    . Bladder repair    . Rotator cuff repair      right  . Cataract extraction      bilat  . Knee      right cartilage repair  . Coccyx  1951    excision    Family History  Problem Relation Age of Onset  . Cancer Daughter     breast  . Cancer Father     NHL  . Cancer Maternal Aunt     breast  . Cancer Maternal Grandmother     rectal  . Cancer Son     melanoma    History  Substance Use Topics  . Smoking status: Never Smoker   . Smokeless tobacco: Not on file  . Alcohol Use: No    OB History   Grav Para Term Preterm Abortions TAB SAB Ect Mult Living                   Review of Systems  Constitutional: Negative for activity change and appetite change.  HENT: Negative for neck stiffness.   Eyes: Negative for pain.  Respiratory: Negative for chest tightness and shortness of breath.   Cardiovascular: Negative for chest pain and leg swelling.  Gastrointestinal: Negative for nausea, vomiting, abdominal pain and diarrhea.  Genitourinary: Negative for flank pain.  Musculoskeletal: Negative for back pain.  Skin: Positive for wound. Negative for rash.  Neurological: Positive for dizziness and headaches. Negative for weakness and numbness.  Psychiatric/Behavioral: Negative for behavioral problems.    Allergies  Fish allergy and Sulfa antibiotics  Home Medications   Current Outpatient Rx  Name  Route  Sig  Dispense  Refill  . aspirin 81 MG tablet   Oral   Take 81 mg by mouth daily.         . Biotin 5 MG CAPS   Oral   Take 1 capsule by mouth daily.         . Calcium Carbonate (CALTRATE 600 PO)   Oral   Take 1,200 mg by mouth 2 (two) times daily.          Marland Kitchen  cetirizine (ZYRTEC) 10 MG tablet   Oral   Take 10 mg by mouth daily.         Marland Kitchen CRANBERRY PO   Oral   Take 1 tablet by mouth 3 (three) times daily.         . cyanocobalamin 500 MCG tablet   Oral   Take 500 mcg by mouth daily.         Marland Kitchen doxazosin (CARDURA) 1 MG tablet   Oral   Take 0.5 mg by mouth at bedtime.         . furosemide (LASIX) 20 MG tablet   Oral   Take 20 mg by mouth daily.         Marland Kitchen ipratropium (ATROVENT) 0.06 % nasal spray   Nasal   Place 2 sprays into the nose daily.         Astrid Drafts Omega-3 300 MG CAPS   Oral   Take 1 capsule by mouth daily.         Marland Kitchen levothyroxine (SYNTHROID, LEVOTHROID) 25 MCG tablet   Oral   Take 75 mcg by mouth daily.         Marland Kitchen loratadine (CLARITIN) 10 MG tablet   Oral   Take 10 mg by mouth daily.         . Multiple Vitamin (MULTIVITAMIN) capsule   Oral   Take 1 capsule by mouth daily.          . Multiple Vitamins-Minerals (ICAPS MV PO)   Oral   Take 1 capsule by mouth daily.         Marland Kitchen oxybutynin (DITROPAN) 5 MG tablet   Oral   Take 5 mg by mouth daily.         . pantoprazole (PROTONIX) 40 MG tablet   Oral   Take 40 mg by mouth daily.         . simvastatin (ZOCOR) 40 MG tablet   Oral   Take 40 mg by mouth every evening.         . valsartan (DIOVAN) 320 MG tablet   Oral   Take 320 mg by mouth daily.         . vitamin C (ASCORBIC ACID) 500 MG tablet   Oral   Take 500 mg by mouth daily.           BP 181/84  Pulse 56  Temp(Src) 97.5 F (36.4 C) (Oral)  Resp 22  SpO2 96%  Physical Exam  Nursing note and vitals reviewed. Constitutional: She is oriented to person, place, and time. She appears well-developed and well-nourished.  HENT:  Head: Normocephalic.  Approximately 3 cm hematoma to occipital area. Small approximately 5 mm laceration. No crepitance or deformity  Eyes: EOM are normal. Pupils are equal, round, and reactive to light.  Neck: Normal range of motion. Neck supple.  No midline tenderness  Cardiovascular: Regular rhythm and normal heart sounds.   No murmur heard. Mild bradycardia  Pulmonary/Chest: Effort normal and breath sounds normal. No respiratory distress. She has no wheezes. She has no rales.  Abdominal: Soft. Bowel sounds are normal. She exhibits no distension. There is no tenderness. There is no rebound and no guarding.  Musculoskeletal: Normal range of motion.  Neurological: She is alert and oriented to person, place, and time. No cranial nerve deficit.  Skin: Skin is warm and dry.  Psychiatric: She has a normal mood and affect. Her speech is normal.    ED Course  Procedures (including critical care time)  Labs Reviewed - No data to display Ct Head Wo Contrast  06/17/2012  *RADIOLOGY REPORT*  Clinical Data:   Trauma to back of head after falling.  Dizziness. Hypertension.  Osteoporosis.  History of melanoma.  CT HEAD  WITHOUT CONTRAST  Technique: Contiguous axial images were obtained from the base of the skull through the vertex without contrast  Comparison: None  Findings:  Bone windows demonstrate soft tissue swelling about the occipital scalp on image 34/series 7.  Osteopenia.  Hypoplastic frontal sinuses.  No underlying skull fracture. Clear mastoid air cells.  Soft tissue windows demonstrate expected cerebral atrophy.  No hemorrhage, hydrocephalus, intra-axial, or extra-axial fluid collection. No mass lesion.  Vague hypoattenuation suspected within the left anterior limb internal capsule extending into the periventricular matter of the left frontal lobe.  Example images 13 - 14/series 3.  IMPRESSION:  1.  Occipital scalp soft tissue swelling, without acute post- traumatic intracranial abnormality. 2.  Subtle left basil ganglia and frontal white matter hypoattenuation.  Given the clinical history, favored to be the sequelae of remote ischemia.  Correlate with any symptoms to suggest acute/subacute ischemia.  CT CERVICAL SPINE WITHOUT CONTRAST  Technique: Continous axial images were obtained of the cervical spine without contrast.  Sagittal and coronal reformats were constructed.  Findings:  Spinal visualization through the bottom of T3. Prevertebral soft tissues are within normal limits.  No apical pneumothorax.  Multilevel spondylosis.  Extensive degenerative irregularity of the C1-C2 articulation.  Spondylosis results in left greater than right C3-C4 neural foraminal narrowing.  Central canal stenosis and bilateral neural foraminal narrowing at C4-C5. Right greater than left neural foraminal and central canal narrowing at C5-C6.  Relatively mild right neural foraminal narrowing at C6-C7 and C7-T1.  Skull base intact.  Prominent end plate osteophytes, including at C4-C7. Facets are well-aligned.  IMPRESSION: Advance spondylosis, without acute fracture or subluxation.  The extent of cervical spondylosis and resultant central  canal /neural foraminal narrowing could predispose the patient to cord injury in the setting of trauma.  If this is a clinical concern, MRI would be the test of choice.   Original Report Authenticated By: Abigail Miyamoto, M.D.    Ct Cervical Spine Wo Contrast  06/17/2012  *RADIOLOGY REPORT*  Clinical Data:   Trauma to back of head after falling.  Dizziness. Hypertension.  Osteoporosis.  History of melanoma.  CT HEAD WITHOUT CONTRAST  Technique: Contiguous axial images were obtained from the base of the skull through the vertex without contrast  Comparison: None  Findings:  Bone windows demonstrate soft tissue swelling about the occipital scalp on image 34/series 7.  Osteopenia.  Hypoplastic frontal sinuses.  No underlying skull fracture. Clear mastoid air cells.  Soft tissue windows demonstrate expected cerebral atrophy.  No hemorrhage, hydrocephalus, intra-axial, or extra-axial fluid collection. No mass lesion.  Vague hypoattenuation suspected within the left anterior limb internal capsule extending into the periventricular matter of the left frontal lobe.  Example images 13 - 14/series 3.  IMPRESSION:  1.  Occipital scalp soft tissue swelling, without acute post- traumatic intracranial abnormality. 2.  Subtle left basil ganglia and frontal white matter hypoattenuation.  Given the clinical history, favored to be the sequelae of remote ischemia.  Correlate with any symptoms to suggest acute/subacute ischemia.  CT CERVICAL SPINE WITHOUT CONTRAST  Technique: Continous axial images were obtained of the cervical spine without contrast.  Sagittal and coronal reformats were constructed.  Findings:  Spinal visualization through the bottom of  T3. Prevertebral soft tissues are within normal limits.  No apical pneumothorax.  Multilevel spondylosis.  Extensive degenerative irregularity of the C1-C2 articulation.  Spondylosis results in left greater than right C3-C4 neural foraminal narrowing.  Central canal stenosis and  bilateral neural foraminal narrowing at C4-C5. Right greater than left neural foraminal and central canal narrowing at C5-C6.  Relatively mild right neural foraminal narrowing at C6-C7 and C7-T1.  Skull base intact.  Prominent end plate osteophytes, including at C4-C7. Facets are well-aligned.  IMPRESSION: Advance spondylosis, without acute fracture or subluxation.  The extent of cervical spondylosis and resultant central canal /neural foraminal narrowing could predispose the patient to cord injury in the setting of trauma.  If this is a clinical concern, MRI would be the test of choice.   Original Report Authenticated By: Abigail Miyamoto, M.D.      1. Fall, initial encounter   2. Scalp laceration, initial encounter      Date: 06/17/2012  Rate: 55  Rhythm: sinus bradycardia  QRS Axis: normal  Intervals: normal  ST/T Wave abnormalities: normal  Conduction Disutrbances:left bundle branch block  Narrative Interpretation:   Old EKG Reviewed: none available    MDM  Patient with mechanical fall in driveway. Small laceration to head. Negative head CT and cervical spine CT. Patient be discharged home.        Jasper Riling. Alvino Chapel, MD 06/17/12 1601

## 2012-06-17 NOTE — ED Notes (Signed)
Pt to department via EMS- pt reports she was walking outside this morning and slipped hitting the back of her head on the ice. Neighbor reports pt was dazed when he went to help her. Pt reports some dizziness on arrival but A&Ox4. Pt with hematoma to the back of her head. Denies any LOC. Bp-186 palp. Hr-50

## 2012-09-22 ENCOUNTER — Telehealth: Payer: Self-pay | Admitting: Oncology

## 2012-09-22 NOTE — Telephone Encounter (Signed)
Moved 7/10 appt to covering provider due to DM's departure. lmonvm for pt on both cell/home re new time for lb/fu 7/10 @ 12:30 pm.

## 2012-10-19 ENCOUNTER — Ambulatory Visit (HOSPITAL_BASED_OUTPATIENT_CLINIC_OR_DEPARTMENT_OTHER): Payer: Medicare Other | Admitting: Hematology and Oncology

## 2012-10-19 ENCOUNTER — Other Ambulatory Visit: Payer: Medicare Other | Admitting: Lab

## 2012-10-19 ENCOUNTER — Telehealth: Payer: Self-pay | Admitting: Hematology and Oncology

## 2012-10-19 ENCOUNTER — Ambulatory Visit: Payer: Medicare Other | Admitting: Lab

## 2012-10-19 VITALS — BP 152/72 | HR 66 | Temp 97.0°F | Resp 18 | Ht 60.0 in | Wt 127.7 lb

## 2012-10-19 DIAGNOSIS — C4360 Malignant melanoma of unspecified upper limb, including shoulder: Secondary | ICD-10-CM

## 2012-10-19 DIAGNOSIS — C4362 Malignant melanoma of left upper limb, including shoulder: Secondary | ICD-10-CM

## 2012-10-19 LAB — CBC WITH DIFFERENTIAL/PLATELET
BASO%: 0.5 % (ref 0.0–2.0)
Basophils Absolute: 0.1 10*3/uL (ref 0.0–0.1)
EOS%: 4.8 % (ref 0.0–7.0)
Eosinophils Absolute: 0.5 10*3/uL (ref 0.0–0.5)
HCT: 38.4 % (ref 34.8–46.6)
HGB: 12.9 g/dL (ref 11.6–15.9)
LYMPH%: 16 % (ref 14.0–49.7)
MCH: 31.9 pg (ref 25.1–34.0)
MCHC: 33.5 g/dL (ref 31.5–36.0)
MCV: 95.2 fL (ref 79.5–101.0)
MONO#: 1 10*3/uL — ABNORMAL HIGH (ref 0.1–0.9)
MONO%: 10 % (ref 0.0–14.0)
NEUT#: 6.8 10*3/uL — ABNORMAL HIGH (ref 1.5–6.5)
NEUT%: 68.7 % (ref 38.4–76.8)
Platelets: 264 10*3/uL (ref 145–400)
RBC: 4.03 10*6/uL (ref 3.70–5.45)
RDW: 13.4 % (ref 11.2–14.5)
WBC: 9.9 10*3/uL (ref 3.9–10.3)
lymph#: 1.6 10*3/uL (ref 0.9–3.3)

## 2012-10-19 LAB — COMPREHENSIVE METABOLIC PANEL (CC13)
ALT: 17 U/L (ref 0–55)
AST: 21 U/L (ref 5–34)
Albumin: 3.4 g/dL — ABNORMAL LOW (ref 3.5–5.0)
Alkaline Phosphatase: 65 U/L (ref 40–150)
BUN: 21.1 mg/dL (ref 7.0–26.0)
CO2: 26 mEq/L (ref 22–29)
Calcium: 9.7 mg/dL (ref 8.4–10.4)
Chloride: 108 mEq/L (ref 98–109)
Creatinine: 1 mg/dL (ref 0.6–1.1)
Glucose: 93 mg/dl (ref 70–140)
Potassium: 4.3 mEq/L (ref 3.5–5.1)
Sodium: 144 mEq/L (ref 136–145)
Total Bilirubin: 0.37 mg/dL (ref 0.20–1.20)
Total Protein: 7.2 g/dL (ref 6.4–8.3)

## 2012-10-19 LAB — LACTATE DEHYDROGENASE (CC13): LDH: 225 U/L (ref 125–245)

## 2012-10-19 NOTE — Telephone Encounter (Signed)
gv pt appt schedule for July 2015.

## 2012-10-19 NOTE — Progress Notes (Signed)
CC:   Cindy [name], M.D. Smith Mince, M.D. Cindy [name], M.D.  PROBLEM LIST: 1. Superficial spreading malignant melanoma 1.16 mm Breslow's     measurement and Clark's level IV located on the left upper lateral     arm with biopsy on 11/12/2004.  There was no ulceration.  Re-     excision and a sentinel lymph node biopsy revealed negative margins     and no tumor in the 1 lymph node that was removed on 12/02/2004.     Pathologic stage was T2a N0, stage IB. 2. Hiatal hernia. 3. Systolic ejection murmur. 4. Diverticulosis. 5. Gastroesophageal reflux disease. 6. Hypertension. 7. Osteoporosis with compression fractures. 8. Dyslipidemia. 9. Hypothyroidism. 10.Macular degeneration. 11.Right middle lobe pulmonary nodule. 12.Right middle lobe lung nodule. 13.Positive family history of breast cancer in the patient's daughter     and maternal aunt with apparent negative genetic testing in the     patient's daughter.  MEDICATIONS: 1. Fosamax 70 mg every 7 days. 2. Aspirin 81 mg daily. 3. Calcium carbonate 600 mg twice daily. 4. Cyanocobalamin 1000 mg daily. 5. Ergocalciferol 1000 units daily. 6. Folic acid A999333 mcg daily. 7. Lasix 20 mg daily. 8. Glucosamine chondroitin 500/400 one tablet twice daily. 9. Vicodin 5/500 one daily as needed. 10.Atrovent inhaler 1 puff as needed. 11.Levothyroxine 75 mcg daily. 12.Claritin 10 mg daily. 13.Toprol-XL 50 mg daily. 14.Multivitamins 1 daily. 15.Multivitamins with minerals 1 daily. 16.Prilosec 20 mg daily. 17.Zocor 40 mg every evening.  HISTORY:  Cindy [name] was seen today for followup of her pT2a pN0, IB superficial spreading malignant melanoma involving the left upper lateral arm with negative sentinel lymph node and clear margins dating back to August 2006.  Cindy [name] was last seen by [place] on 11/10/2010.  She is now is 79 years old and overall health seems to be excellent.  She is quite mentally sharp, drives, is  self-sufficient.  Her husband has had some health problems lately.  The patient denies any symptoms to suggest recurrence of her melanoma.  She has had some problems with her back, apparently some compression fractures, and today had an epidural injection.  She has been followed by Dr. Girard Cooter at [facility] for a lung nodule that apparently has a low likelihood for malignancy.  The patient tells me that her daughter, Cindy [name], had genetic testing at [facility] for breast cancer and these tests were negative.  The patient's major concern today is that she is losing her hair.  She will be seeing her dermatologist, Dr. Pamala Duffel, within the next week or two.  PHYSICAL EXAMINATION:  The patient is a very spry, energetic woman of 79.  Weight is stable at 140.3 pounds.   Blood pressure 152/72, pulse 66, temperature 97 F (36.1 C), temperature source Oral, resp. rate 18, height 5' (1.524 m), weight 127 lb 11.2 oz (57.924 kg). There is no scleral icterus.  Mouth and pharynx benign.  No peripheral adenopathy palpable in the neck or axillary areas.  Lungs:  Clear to percussion and auscultation.  Cardiac:  Regular rhythm with systolic ejection murmur.  Breasts were not examined.  The patient has yearly mammograms and is actually due for mammograms this month.  Abdomen:  Benign with no organomegaly or masses palpable. Extremities:  No peripheral edema.  The site of the melanoma on the left lateral posterior arm is well-healed without any suspicious findings. The patient has some purpura over her arms.  Neurologic:  Normal.  LABORATORY DATA:   Pending  IMAGING STUDIES: 1. CT angiogram of the chest with IV contrast on 10/18/2009 showed no     CT findings for pulmonary embolism.  There was a large hiatal     hernia.  There was left lower lobe atelectasis.  There was a 14 mm     right middle lobe pulmonary nodule that had increased in size since     2007 when  it measured 9 mm. 2. MRI of the right knee without contrast was negative for fracture or     other acute abnormalities.  There was extensive and progressive     tearing of the posterior horn and body of the medial meniscus.     There was a small oblique tear in the anterior horn of the lateral     meniscus and there was tricompartmental degenerative change. 3. CT scan of abdomen and pelvis with IV contrast on 08/04/2011 showed     no acute abnormality of the abdomen and pelvis.  Diverticulosis was     present along with extensive chronic degenerative changes in the     lower lumbar spine with fairly severe spinal stenosis at L4-5. 4. CT scan of the chest with IV contrast on 08/04/2011 showed no acute     abnormalities.  There was chronic elevation of the left     hemidiaphragm with secondary compressive atelectasis.  There was a     stable nodule in the region of the inferior aspect of the right     hilum.  IMPRESSION AND PLAN:  Cindy [name] is now out 8 years from the time of diagnosis of her thin melanoma involving the left upper arm.  There was no evidence for recurrence of disease.  She is doing quite well with no major ongoing health issues at this time.  We will plan to see Cindy [name] again in 1 year at which time we will check CBC and chemistries and LDH.

## 2012-11-21 ENCOUNTER — Other Ambulatory Visit: Payer: Self-pay | Admitting: Dermatology

## 2012-12-07 ENCOUNTER — Encounter: Payer: Self-pay | Admitting: *Deleted

## 2012-12-14 ENCOUNTER — Encounter: Payer: Self-pay | Admitting: Cardiovascular Disease

## 2012-12-14 ENCOUNTER — Ambulatory Visit (INDEPENDENT_AMBULATORY_CARE_PROVIDER_SITE_OTHER): Payer: Medicare Other | Admitting: Cardiovascular Disease

## 2012-12-14 VITALS — BP 102/52 | HR 58 | Ht 60.0 in | Wt 121.0 lb

## 2012-12-14 DIAGNOSIS — I447 Left bundle-branch block, unspecified: Secondary | ICD-10-CM

## 2012-12-14 DIAGNOSIS — I10 Essential (primary) hypertension: Secondary | ICD-10-CM

## 2012-12-14 DIAGNOSIS — E785 Hyperlipidemia, unspecified: Secondary | ICD-10-CM | POA: Insufficient documentation

## 2012-12-14 DIAGNOSIS — E782 Mixed hyperlipidemia: Secondary | ICD-10-CM | POA: Insufficient documentation

## 2012-12-14 NOTE — Progress Notes (Signed)
12/14/2012 Cindy [name]   November 09, 1922  ID:[id number]  Primary Physician No primary provider on file. Primary Cardiologist: Cindy Harp MD Cindy [name]   HPI:  The patient returns today for annual followup. She is a delightful 79 year old thin and frail appearing married Caucasian female whose husband is also a patient of mine and is accompanying her today. I last saw her a year ago. She has a history of normal coronary arteries by cath in 2005 and again in 2007. She has chronic left bundle branch block, hypertension and hyperlipidemia. She also has GERD, followed by Dr. Carol Ada with an endoscopy in the past. [name] followed her lipid profile.since I saw her back in the office 12/22/11 she Germaine and currently stable. She denies chest pain or shortness of breath.     Current Outpatient Prescriptions  Medication Sig Dispense Refill  . aspirin 81 MG tablet Take 81 mg by mouth daily.      . Calcium Carbonate (CALTRATE 600 PO) Take 1,200 mg by mouth 2 (two) times daily.       Marland Kitchen CRANBERRY PO Take 1 tablet by mouth 3 (three) times daily.      . cyanocobalamin 500 MCG tablet Take 500 mcg by mouth daily.      . furosemide (LASIX) 20 MG tablet Take 20 mg by mouth daily.      . Glucosamine-Chondroitin (MOVE FREE PO) Take 2 tablets by mouth daily.      Astrid Drafts Omega-3 300 MG CAPS Take 1 capsule by mouth daily.      Marland Kitchen levothyroxine (SYNTHROID, LEVOTHROID) 25 MCG tablet Take 75 mcg by mouth daily.      Marland Kitchen loratadine (CLARITIN) 10 MG tablet Take 10 mg by mouth daily.      . Multiple Vitamin (MULTIVITAMIN) capsule Take 1 capsule by mouth daily.      . Multiple Vitamins-Minerals (ICAPS MV PO) Take by mouth.      . NON FORMULARY Hair, skin, and nail vitamin 5042mcg 3 tablets daily      . omeprazole (PRILOSEC) 20 MG capsule Take 20 mg by mouth 2 (two) times daily.      Marland Kitchen oxybutynin (DITROPAN) 5 MG tablet Take 5 mg by mouth daily.      . simvastatin (ZOCOR) 40 MG tablet Take 40  mg by mouth every evening.      . valsartan (DIOVAN) 320 MG tablet Take 320 mg by mouth daily.      . vitamin C (ASCORBIC ACID) 500 MG tablet Take 500 mg by mouth daily.       No current facility-administered medications for this visit.    Allergies  Allergen Reactions  . Fish Allergy Hives  . Sulfa Antibiotics Other (See Comments)    Reaction unknown    History   Social History  . Marital Status: Married    Spouse Name: N/A    Number of Children: N/A  . Years of Education: N/A   Occupational History  . Not on file.   Social History Main Topics  . Smoking status: Never Smoker   . Smokeless tobacco: Not on file  . Alcohol Use: No  . Drug Use:   . Sexual Activity:    Other Topics Concern  . Not on file   Social History Narrative  . No narrative on file     Review of Systems: General: negative for chills, fever, night sweats or weight changes.  Cardiovascular: negative for chest pain, dyspnea on exertion, edema,  orthopnea, palpitations, paroxysmal nocturnal dyspnea or shortness of breath Dermatological: negative for rash Respiratory: negative for cough or wheezing Urologic: negative for hematuria Abdominal: negative for nausea, vomiting, diarrhea, bright red blood per rectum, melena, or hematemesis Neurologic: negative for visual changes, syncope, or dizziness All other systems reviewed and are otherwise negative except as noted above.    Blood pressure 102/52, pulse 58, height 5' (1.524 m), weight 121 lb (54.885 kg).  General appearance: alert and no distress Neck: no adenopathy, no carotid bruit, no JVD, supple, symmetrical, trachea midline and thyroid not enlarged, symmetric, no tenderness/mass/nodules Lungs: clear to auscultation bilaterally Heart: regular rate and rhythm, S1, S2 normal, no murmur, click, rub or gallop Extremities: extremities normal, atraumatic, no cyanosis or edema  EKG sinus bradycardia at 59 with left bundle branch block  ASSESSMENT  AND PLAN:   Essential hypertension Well-controlled on current medications  Hyperlipidemia On statin therapy followed by her PCP      Cindy Harp MD [facility] 12/14/2012 2:48 PM

## 2012-12-14 NOTE — Assessment & Plan Note (Signed)
Well-controlled on current medications 

## 2012-12-14 NOTE — Patient Instructions (Addendum)
Your physician wants you to follow-up in: 1 year with Dr Berry. You will receive a reminder letter in the mail two months in advance. If you don't receive a letter, please call our office to schedule the follow-up appointment.  

## 2012-12-14 NOTE — Assessment & Plan Note (Signed)
On statin therapy followed by her PCP 

## 2013-05-21 ENCOUNTER — Encounter (INDEPENDENT_AMBULATORY_CARE_PROVIDER_SITE_OTHER): Payer: Medicare Other | Admitting: Ophthalmology

## 2013-05-21 DIAGNOSIS — I10 Essential (primary) hypertension: Secondary | ICD-10-CM

## 2013-05-21 DIAGNOSIS — H43819 Vitreous degeneration, unspecified eye: Secondary | ICD-10-CM

## 2013-05-21 DIAGNOSIS — H3530 Unspecified macular degeneration: Secondary | ICD-10-CM

## 2013-05-21 DIAGNOSIS — H35039 Hypertensive retinopathy, unspecified eye: Secondary | ICD-10-CM

## 2013-07-25 ENCOUNTER — Emergency Department (HOSPITAL_COMMUNITY): Payer: Medicare Other

## 2013-07-25 ENCOUNTER — Encounter (HOSPITAL_COMMUNITY): Payer: Self-pay | Admitting: Emergency Medicine

## 2013-07-25 ENCOUNTER — Emergency Department (HOSPITAL_COMMUNITY)
Admission: EM | Admit: 2013-07-25 | Discharge: 2013-07-26 | Disposition: A | Discharge status: Home / Self Care | Payer: Medicare Other | Attending: Emergency Medicine | Admitting: Emergency Medicine

## 2013-07-25 DIAGNOSIS — Z7982 Long term (current) use of aspirin: Secondary | ICD-10-CM | POA: Insufficient documentation

## 2013-07-25 DIAGNOSIS — K449 Diaphragmatic hernia without obstruction or gangrene: Secondary | ICD-10-CM

## 2013-07-25 DIAGNOSIS — Z85820 Personal history of malignant melanoma of skin: Secondary | ICD-10-CM | POA: Insufficient documentation

## 2013-07-25 DIAGNOSIS — K219 Gastro-esophageal reflux disease without esophagitis: Secondary | ICD-10-CM | POA: Insufficient documentation

## 2013-07-25 DIAGNOSIS — M810 Age-related osteoporosis without current pathological fracture: Secondary | ICD-10-CM | POA: Insufficient documentation

## 2013-07-25 DIAGNOSIS — Z8669 Personal history of other diseases of the nervous system and sense organs: Secondary | ICD-10-CM | POA: Insufficient documentation

## 2013-07-25 DIAGNOSIS — N183 Chronic kidney disease, stage 3 unspecified: Secondary | ICD-10-CM | POA: Insufficient documentation

## 2013-07-25 DIAGNOSIS — E785 Hyperlipidemia, unspecified: Secondary | ICD-10-CM | POA: Insufficient documentation

## 2013-07-25 DIAGNOSIS — Z79899 Other long term (current) drug therapy: Secondary | ICD-10-CM | POA: Insufficient documentation

## 2013-07-25 DIAGNOSIS — I129 Hypertensive chronic kidney disease with stage 1 through stage 4 chronic kidney disease, or unspecified chronic kidney disease: Secondary | ICD-10-CM | POA: Insufficient documentation

## 2013-07-25 DIAGNOSIS — R011 Cardiac murmur, unspecified: Secondary | ICD-10-CM | POA: Insufficient documentation

## 2013-07-25 DIAGNOSIS — R0789 Other chest pain: Secondary | ICD-10-CM

## 2013-07-25 DIAGNOSIS — E039 Hypothyroidism, unspecified: Secondary | ICD-10-CM | POA: Insufficient documentation

## 2013-07-25 LAB — I-STAT TROPONIN, ED: Troponin i, poc: 0.01 ng/mL (ref 0.00–0.08)

## 2013-07-25 NOTE — ED Notes (Signed)
Pt is from home, where the pt takes care of her husband. Pt states that she ate dinner about 4 1/2 hours ago, and about 15 minutes later started having left sided chest pain and nausea and dry heaves. Pt states that she thought she had gas, so she took "a few" Tums with no relief. The pt then took 2 NTG with no relief as well as 324 ASA. En route, pt received 4 mg Zofran.

## 2013-07-25 NOTE — ED Notes (Signed)
Ward, MD at bedside.

## 2013-07-25 NOTE — ED Provider Notes (Signed)
TIME SEEN: 11:03 PM  CHIEF COMPLAINT: Left upper abdominal pain, chest pain  HPI: Patient is a 80 y.o. F with history of hypertension, dyslipidemia, hypothyroidism, chronic kidney disease, hypothyroidism who presents to the emergency department with an episode of left upper quadrant and left lower chest pain that she describes as feeling like indigestion that started at 5:30 PM tonight. She reports her pain started approximately 15 minutes after eating a sandwich, piece of cake and drinking a hot cup of tea. She had nausea and was spitting frequently but no shortness of breath, dizziness, diaphoresis or vomiting. No bloody stools or melena. She took commons and 2 of her husband's nitroglycerin tablets with no relief. She also took 324 mg of aspirin. She states that EMS gave her Zofran and her pain is now completely gone. She denies any history of cardiac disease, PE or DVT, recent prolonged immobilization, surgery, fracture, trauma, hospitalization. She denies any fevers or cough.  ROS: See HPI Constitutional: no fever  Eyes: no drainage  ENT: no runny nose   Cardiovascular:   chest pain  Resp: no SOB  GI: no vomiting GU: no dysuria Integumentary: no rash  Allergy: no hives  Musculoskeletal: no leg swelling  Neurological: no slurred speech ROS otherwise negative  PAST MEDICAL HISTORY/PAST SURGICAL HISTORY:  Past Medical History  Diagnosis Date  . GERD (gastroesophageal reflux disease)   . Hypertension   . Osteoporosis   . Malignant melanoma 11/12/04    L upper arm  . Hiatal hernia   . Murmur, cardiac   . Dyslipidemia   . Hypothyroidism   . Macular degeneration   . Lung nodule 07/01/03    right middle lobe  . LBBB (left bundle branch block)   . Chronic renal insufficiency, stage III (moderate)   . History of nuclear stress test 2005    persantine; inferoapical ischmeia with EF 83%    MEDICATIONS:  Prior to Admission medications   Medication Sig Start Date End Date Taking?  Authorizing Provider  aspirin 81 MG tablet Take 81 mg by mouth daily.    Historical Provider, MD  Calcium Carbonate (CALTRATE 600 PO) Take 1,200 mg by mouth 2 (two) times daily.     Historical Provider, MD  CRANBERRY PO Take 1 tablet by mouth 3 (three) times daily.    Historical Provider, MD  cyanocobalamin 500 MCG tablet Take 500 mcg by mouth daily.    Historical Provider, MD  furosemide (LASIX) 20 MG tablet Take 20 mg by mouth daily.    Historical Provider, MD  Glucosamine-Chondroitin (MOVE FREE PO) Take 2 tablets by mouth daily.    Historical Provider, MD  Astrid Drafts Omega-3 300 MG CAPS Take 1 capsule by mouth daily.    Historical Provider, MD  levothyroxine (SYNTHROID, LEVOTHROID) 25 MCG tablet Take 75 mcg by mouth daily.    Historical Provider, MD  loratadine (CLARITIN) 10 MG tablet Take 10 mg by mouth daily.    Historical Provider, MD  Multiple Vitamin (MULTIVITAMIN) capsule Take 1 capsule by mouth daily.    Historical Provider, MD  Multiple Vitamins-Minerals (ICAPS MV PO) Take by mouth.    Historical Provider, MD  NON FORMULARY Hair, skin, and nail vitamin 5069mcg 3 tablets daily    Historical Provider, MD  omeprazole (PRILOSEC) 20 MG capsule Take 20 mg by mouth 2 (two) times daily.    Historical Provider, MD  oxybutynin (DITROPAN) 5 MG tablet Take 5 mg by mouth daily.    Historical Provider, MD  simvastatin (  ZOCOR) 40 MG tablet Take 40 mg by mouth every evening.    Historical Provider, MD  valsartan (DIOVAN) 320 MG tablet Take 320 mg by mouth daily.    Historical Provider, MD  vitamin C (ASCORBIC ACID) 500 MG tablet Take 500 mg by mouth daily.    Historical Provider, MD    ALLERGIES:  Allergies  Allergen Reactions  . Fish Allergy Hives  . Sulfa Antibiotics Other (See Comments)    Reaction unknown    SOCIAL HISTORY:  History  Substance Use Topics  . Smoking status: Never Smoker   . Smokeless tobacco: Not on file  . Alcohol Use: No    FAMILY HISTORY: Family History  Problem  Relation Age of Onset  . Cancer Daughter     breast  . Cancer Father     NHL  . Cancer Maternal Aunt     breast  . Cancer Maternal Grandmother     rectal  . Cancer Son     melanoma    EXAM: BP 139/65  Pulse 59  Temp(Src) 97.3 F (36.3 C) (Oral)  Resp 11  SpO2 99% CONSTITUTIONAL: Alert and oriented and responds appropriately to questions. Well-appearing; well-nourished, elderly; non-toxic HEAD: Normocephalic EYES: Conjunctivae clear, PERRL ENT: normal nose; no rhinorrhea; moist mucous membranes; pharynx without lesions noted NECK: Supple, no meningismus, no LAD  CARD: RRR; S1 and S2 appreciated; no murmurs, no clicks, no rubs, no gallops RESP: Normal chest excursion without splinting or tachypnea; breath sounds clear and equal bilaterally; no wheezes, no rhonchi, no rales,  ABD/GI: Normal bowel sounds; non-distended; soft, non-tender, no rebound, no guarding BACK:  The back appears normal and is non-tender to palpation, there is no CVA tenderness EXT: Normal ROM in all joints; non-tender to palpation; no edema; normal capillary refill; no cyanosis; 2+ DP and radial pulses bilaterally   SKIN: Normal color for age and race; warm NEURO: Moves all extremities equally PSYCH: The patient's mood and manner are appropriate. Grooming and personal hygiene are appropriate.  MEDICAL DECISION MAKING: Pt here with atypical chest pain that occurred 6 hours ago. She is now pain-free and hemodynamically stable. We'll obtain cardiac and abdominal labs, acute abdominal series. Her EKG shows no new ischemic changes, she has had less bundle-branch block which is old.  She does not meet Sgar[place]ossa criteria.  We'll continue to closely monitor. Have offered admission the patient states she has a very sick husband at home and would like discharge. She is followed by Dr. Gwenlyn Found and states she will see him as an outpatient.  ED PROGRESS: Patient's labs are unremarkable. Her creatinine is 1.35 which is improved  from her baseline. Her troponin is negative. Lipase normal. Acute abdominal series shows a large diaphragmatic hernia for which the stomach is chronically herniated. She is having some "heartburn" currently. No other associated symptoms. I am not concerned for gastric torsion or obstruction at this time as she is very comfortable. Her abdominal exam is completely benign. She has been able to eat, drink without difficulty. We'll give history of present illness and Zofran and reassess. We'll repeat troponin at 2:30 AM. Discussed with patient that she may need to follow up with a surgeon as an outpatient.   3:05 AM  Pt's second troponin is negative. Her pain is again completely resolved with Zofran and Protonix. Suspect her pain is secondary to her diaphragmatic hernia. We'll give surgery outpatient followup information. We'll give prescription for Nexium and Zofran. Have discussed with patient if she has  any other symptoms like chest pressure, shortness of breath, diaphoresis or dizziness that she needs to return to the emergency department. Have also recommended she followup with her cardiologist. Her repeat abdominal exam is completely benign. We'll discharge patient home. 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    EKG Interpretation  Date/Time:  Wednesday July 25 2013 22:56:15 EDT Ventricular Rate:  59 PR Interval:  195 QRS Duration: 138 QT Interval:  484 QTC Calculation: 479 R Axis:   -37 Text Interpretation:  Sinus rhythm Left bundle branch block No significant change since last tracing Confirmed by Nasya Vincent,  DO, Taiyana Kissler [phone]) on 07/25/2013 11:02:55 PM          EKG Interpretation  Date/Time:  Thursday July 26 2013 00:49:29 EDT Ventricular Rate:  54 PR Interval:  205 QRS Duration: 139 QT Interval:  531 QTC Calculation: 503 R Axis:   -35 Text Interpretation:  Sinus rhythm Left  bundle branch block No significant change since last tracing Confirmed by Rowe Warman,  DO, Yadir Zentner [id number]) on 07/26/2013 1:05:23 AM        [place], DO 07/26/13 [id number]

## 2013-07-26 LAB — CBC WITH DIFFERENTIAL/PLATELET
Basophils Absolute: 0 10*3/uL (ref 0.0–0.1)
Basophils Relative: 0 % (ref 0–1)
Eosinophils Absolute: 0.1 10*3/uL (ref 0.0–0.7)
Eosinophils Relative: 1 % (ref 0–5)
HCT: 36 % (ref 36.0–46.0)
Hemoglobin: 12.1 g/dL (ref 12.0–15.0)
Lymphocytes Relative: 8 % — ABNORMAL LOW (ref 12–46)
Lymphs Abs: 0.7 10*3/uL (ref 0.7–4.0)
MCH: 31.7 pg (ref 26.0–34.0)
MCHC: 33.6 g/dL (ref 30.0–36.0)
MCV: 94.2 fL (ref 78.0–100.0)
Monocytes Absolute: 0.5 10*3/uL (ref 0.1–1.0)
Monocytes Relative: 6 % (ref 3–12)
Neutro Abs: 7.1 10*3/uL (ref 1.7–7.7)
Neutrophils Relative %: 85 % — ABNORMAL HIGH (ref 43–77)
Platelets: ADEQUATE 10*3/uL (ref 150–400)
RBC: 3.82 MIL/uL — ABNORMAL LOW (ref 3.87–5.11)
RDW: 14.1 % (ref 11.5–15.5)
WBC: 8.4 10*3/uL (ref 4.0–10.5)

## 2013-07-26 LAB — COMPREHENSIVE METABOLIC PANEL
ALT: 18 U/L (ref 0–35)
AST: 25 U/L (ref 0–37)
Albumin: 3.3 g/dL — ABNORMAL LOW (ref 3.5–5.2)
Alkaline Phosphatase: 58 U/L (ref 39–117)
BUN: 35 mg/dL — ABNORMAL HIGH (ref 6–23)
CO2: 21 mEq/L (ref 19–32)
Calcium: 10.5 mg/dL (ref 8.4–10.5)
Chloride: 104 mEq/L (ref 96–112)
Creatinine, Ser: 1.35 mg/dL — ABNORMAL HIGH (ref 0.50–1.10)
GFR calc Af Amer: 39 mL/min — ABNORMAL LOW (ref >90)
GFR calc non Af Amer: 34 mL/min — ABNORMAL LOW (ref >90)
Glucose, Bld: 121 mg/dL — ABNORMAL HIGH (ref 70–99)
Potassium: 4.9 mEq/L (ref 3.7–5.3)
Sodium: 141 mEq/L (ref 137–147)
Total Bilirubin: 0.3 mg/dL (ref 0.3–1.2)
Total Protein: 7.2 g/dL (ref 6.0–8.3)

## 2013-07-26 LAB — I-STAT TROPONIN, ED: Troponin i, poc: 0.03 ng/mL (ref 0.00–0.08)

## 2013-07-26 LAB — LIPASE, BLOOD: Lipase: 47 U/L (ref 11–59)

## 2013-07-26 MED ORDER — GI COCKTAIL ~~LOC~~
30.0000 mL | Freq: Once | ORAL | Status: DC
  Filled 2013-07-26: qty 30

## 2013-07-26 MED ORDER — ONDANSETRON HCL 4 MG/2ML IJ SOLN
4.0000 mg | Freq: Once | INTRAMUSCULAR | Status: AC
  Administered 2013-07-26: 4 mg via INTRAVENOUS
  Filled 2013-07-26: qty 2

## 2013-07-26 MED ORDER — ESOMEPRAZOLE MAGNESIUM 40 MG PO CPDR: 40.0000 mg | DELAYED_RELEASE_CAPSULE | Freq: Every day | ORAL | Status: DC

## 2013-07-26 MED ORDER — ONDANSETRON HCL 4 MG PO TABS: 4.0000 mg | ORAL_TABLET | Freq: Four times a day (QID) | ORAL | Status: DC

## 2013-07-26 MED ORDER — PANTOPRAZOLE SODIUM 40 MG PO TBEC
40.0000 mg | DELAYED_RELEASE_TABLET | Freq: Once | ORAL | Status: AC
  Administered 2013-07-26: 40 mg via ORAL
  Filled 2013-07-26: qty 1

## 2013-07-26 NOTE — ED Notes (Signed)
The  Patient is unable to give an urine specimen at this time. The patient has been advised to use call light for assistance to the restroom. The tech has reported to the RN in charge.

## 2013-07-26 NOTE — Discharge Instructions (Signed)
Chest Pain (Nonspecific) °It is often hard to give a specific diagnosis for the cause of chest pain. There is always a chance that your pain could be related to something serious, such as a heart attack or a blood clot in the lungs. You need to follow up with your caregiver for further evaluation. °CAUSES  °· Heartburn. °· Pneumonia or bronchitis. °· Anxiety or stress. °· Inflammation around your heart (pericarditis) or lung (pleuritis or pleurisy). °· A blood clot in the lung. °· A collapsed lung (pneumothorax). It can develop suddenly on its own (spontaneous pneumothorax) or from injury (trauma) to the chest. °· Shingles infection (herpes zoster virus). °The chest wall is composed of bones, muscles, and cartilage. Any of these can be the source of the pain. °· The bones can be bruised by injury. °· The muscles or cartilage can be strained by coughing or overwork. °· The cartilage can be affected by inflammation and become sore (costochondritis). °DIAGNOSIS  °Lab tests or other studies, such as X-rays, electrocardiography, stress testing, or cardiac imaging, may be needed to find the cause of your pain.  °TREATMENT  °· Treatment depends on what may be causing your chest pain. Treatment may include: °· Acid blockers for heartburn. °· Anti-inflammatory medicine. °· Pain medicine for inflammatory conditions. °· Antibiotics if an infection is present. °· You may be advised to change lifestyle habits. This includes stopping smoking and avoiding alcohol, caffeine, and chocolate. °· You may be advised to keep your head raised (elevated) when sleeping. This reduces the chance of acid going backward from your stomach into your esophagus. °· Most of the time, nonspecific chest pain will improve within 2 to 3 days with rest and mild pain medicine. °HOME CARE INSTRUCTIONS  °· If antibiotics were prescribed, take your antibiotics as directed. Finish them even if you start to feel better. °· For the next few days, avoid physical  activities that bring on chest pain. Continue physical activities as directed. °· Do not smoke. °· Avoid drinking alcohol. °· Only take over-the-counter or prescription medicine for pain, discomfort, or fever as directed by your caregiver. °· Follow your caregiver's suggestions for further testing if your chest pain does not go away. °· Keep any follow-up appointments you made. If you do not go to an appointment, you could develop lasting (chronic) problems with pain. If there is any problem keeping an appointment, you must call to reschedule. °SEEK MEDICAL CARE IF:  °· You think you are having problems from the medicine you are taking. Read your medicine instructions carefully. °· Your chest pain does not go away, even after treatment. °· You develop a rash with blisters on your chest. °SEEK IMMEDIATE MEDICAL CARE IF:  °· You have increased chest pain or pain that spreads to your arm, neck, jaw, back, or abdomen. °· You develop shortness of breath, an increasing cough, or you are coughing up blood. °· You have severe back or abdominal pain, feel nauseous, or vomit. °· You develop severe weakness, fainting, or chills. °· You have a fever. °THIS IS AN EMERGENCY. Do not wait to see if the pain will go away. Get medical help at once. Call your local emergency services (911 in [place] Do not drive yourself to the hospital. °MAKE SURE YOU:  °· Understand these instructions. °· Will watch your condition. °· Will get help right away if you are not doing well or get worse. °Document Released: 01/06/2005 Document Revised: 06/21/2011 Document Reviewed: 11/02/2007 °[employer]® Patient Information ©2014 [employer]   [employer].  Diet for Gastroesophageal Reflux Disease, Adult Reflux (acid reflux) is when acid from your stomach flows up into the esophagus. When acid comes in contact with the esophagus, the acid causes irritation and soreness (inflammation) in the esophagus. When reflux happens often or so severely that it causes damage to  the esophagus, it is called gastroesophageal reflux disease (GERD). Nutrition therapy can help ease the discomfort of GERD. FOODS OR DRINKS TO AVOID OR LIMIT  Smoking or chewing tobacco. Nicotine is one of the most potent stimulants to acid production in the gastrointestinal tract.  Caffeinated and decaffeinated coffee and black tea.  Regular or low-calorie carbonated beverages or energy drinks (caffeine-free carbonated beverages are allowed).   Strong spices, such as black pepper, white pepper, red pepper, cayenne, curry powder, and chili powder.  Peppermint or spearmint.  Chocolate.  High-fat foods, including meats and fried foods. Extra added fats including oils, butter, salad dressings, and nuts. Limit these to less than 8 tsp per day.  Fruits and vegetables if they are not tolerated, such as citrus fruits or tomatoes.  Alcohol.  Any food that seems to aggravate your condition. If you have questions regarding your diet, call your caregiver or a registered dietitian. OTHER THINGS THAT MAY HELP GERD INCLUDE:   Eating your meals slowly, in a relaxed setting.  Eating 5 to 6 small meals per day instead of 3 large meals.  Eliminating food for a period of time if it causes distress.  Not lying down until 3 hours after eating a meal.  Keeping the head of your bed raised 6 to 9 inches (15 to 23 cm) by using a foam wedge or blocks under the legs of the bed. Lying flat may make symptoms worse.  Being physically active. Weight loss may be helpful in reducing reflux in overweight or obese adults.  Wear loose fitting clothing EXAMPLE MEAL PLAN This meal plan is approximately 2,000 calories based on [email or web address] meal planning guidelines. Breakfast   cup cooked oatmeal.  1 cup strawberries.  1 cup low-fat milk.  1 oz almonds. Snack  1 cup cucumber slices.  6 oz yogurt (made from low-fat or fat-free milk). Lunch  2 slice whole-wheat bread.  2 oz sliced  [place].  2 tsp mayonnaise.  1 cup blueberries.  1 cup snap peas. Snack  6 whole-wheat crackers.  1 oz string cheese. Dinner   cup brown rice.  1 cup mixed veggies.  1 tsp olive oil.  3 oz grilled fish. Document Released: 03/29/2005 Document Revised: 06/21/2011 Document Reviewed: 02/12/2011 [facility] Patient Information 2014 [place], [place].  Gastroesophageal Reflux Disease, Adult Gastroesophageal reflux disease (GERD) happens when acid from your stomach flows up into the esophagus. When acid comes in contact with the esophagus, the acid causes soreness (inflammation) in the esophagus. Over time, GERD may create small holes (ulcers) in the lining of the esophagus. CAUSES   Increased body weight. This puts pressure on the stomach, making acid rise from the stomach into the esophagus.  Smoking. This increases acid production in the stomach.  Drinking alcohol. This causes decreased pressure in the lower esophageal sphincter (valve or ring of muscle between the esophagus and stomach), allowing acid from the stomach into the esophagus.  Late evening meals and a full stomach. This increases pressure and acid production in the stomach.  A malformed lower esophageal sphincter. Sometimes, no cause is found. SYMPTOMS   Burning pain in the lower part of the mid-chest behind the breastbone and in the  mid-stomach area. This may occur twice a week or more often.  Trouble swallowing.  Sore throat.  Dry cough.  Asthma-like symptoms including chest tightness, shortness of breath, or wheezing. DIAGNOSIS  Your caregiver may be able to diagnose GERD based on your symptoms. In some cases, X-rays and other tests may be done to check for complications or to check the condition of your stomach and esophagus. TREATMENT  Your caregiver may recommend over-the-counter or prescription medicines to help decrease acid production. Ask your caregiver before starting or adding any new medicines.   HOME CARE INSTRUCTIONS   Change the factors that you can control. Ask your caregiver for guidance concerning weight loss, quitting smoking, and alcohol consumption.  Avoid foods and drinks that make your symptoms worse, such as:  Caffeine or alcoholic drinks.  Chocolate.  Peppermint or mint flavorings.  Garlic and onions.  Spicy foods.  Citrus fruits, such as oranges, lemons, or limes.  Tomato-based foods such as sauce, chili, salsa, and pizza.  Fried and fatty foods.  Avoid lying down for the 3 hours prior to your bedtime or prior to taking a nap.  Eat small, frequent meals instead of large meals.  Wear loose-fitting clothing. Do not wear anything tight around your waist that causes pressure on your stomach.  Raise the head of your bed 6 to 8 inches with wood blocks to help you sleep. Extra pillows will not help.  Only take over-the-counter or prescription medicines for pain, discomfort, or fever as directed by your caregiver.  Do not take aspirin, ibuprofen, or other nonsteroidal anti-inflammatory drugs (NSAIDs). SEEK IMMEDIATE MEDICAL CARE IF:   You have pain in your arms, neck, jaw, teeth, or back.  Your pain increases or changes in intensity or duration.  You develop nausea, vomiting, or sweating (diaphoresis).  You develop shortness of breath, or you faint.  Your vomit is green, yellow, black, or looks like coffee grounds or blood.  Your stool is red, bloody, or black. These symptoms could be signs of other problems, such as heart disease, gastric bleeding, or esophageal bleeding. MAKE SURE YOU:   Understand these instructions.  Will watch your condition.  Will get help right away if you are not doing well or get worse. Document Released: 01/06/2005 Document Revised: 06/21/2011 Document Reviewed: 10/16/2010 [facility] Patient Information 2014 [place], [place].  Diaphragmatic Hernia A diaphragmatic  hernia occurs when a part of the stomach slides above  the diaphragm. The diaphragm is the thin muscle separating the belly (abdomen) from the chest. A hiatal hernia can be something you are born with or develop over time. Diaphragmatic hernias may allow stomach acid to flow back into your esophagus, the tube which carries food from your mouth to your stomach. If this acid causes problems it is called GERD (gastro-esophageal reflux disease).  SYMPTOMS  Common symptoms of GERD are heartburn (burning in your chest). This is worse when lying down or bending over. It may also cause belching and indigestion. Some of the things which make GERD worse are:  Increased weight pushes on stomach making acid rise more easily.  Smoking markedly increases acid production.  Alcohol decreases lower esophageal sphincter pressure (valve between stomach and esophagus), allowing acid from stomach into esophagus.  Late evening meals and going to bed with a full stomach increases pressure.  Anything that causes an increase in acid production.  Lower esophageal sphincter incompetence. DIAGNOSIS  Hiatal hernia is often diagnosed with x-rays of your stomach and small bowel. This is called  an UGI (upper gastrointestinal x-ray). Sometimes a gastroscopic procedure is done. This is a procedure where your caregiver uses a flexible instrument to look into the stomach and small bowel. HOME CARE INSTRUCTIONS   Try to achieve and maintain an ideal body weight.  Avoid drinking alcoholic beverages.  Stop smoking.  Put the head of your bed on 4 to 6 inch blocks. This will keep your head and esophagus higher than your stomach. If you cannot use blocks, sleep with several pillows under your head and shoulders.  Over-the-counter medications will decrease acid production. Your caregiver can also prescribe medications for this. Take as directed.  1/2 to 1 teaspoon of an antacid taken every hour while awake, with meals and at bedtime, will neutralize acid.  Do not take aspirin,  ibuprofen (Advil or Motrin), or other nonsteroidal anti-inflammatory drugs.  Do not wear tight clothing around your chest or stomach.  Eat smaller meals and eat more frequently. This keeps your stomach from getting too full. Eat slowly.  Do not lie down for 2 or 3 hours after eating. Do not eat or drink anything 1 to 2 hours before going to bed.  Avoid caffeine beverages (colas, coffee, cocoa, tea), fatty foods, citrus fruits and all other foods and drinks that contain acid and that seem to increase the problems.  Avoid bending over, especially after eating. Also avoid straining during bowel movements or when urinating or lifting things. Anything that increases the pressure in your belly increases the amount of acid that may be pushed up into your esophagus. SEEK IMMEDIATE MEDICAL CARE IF:  There is change in location (pain in arms, neck, jaw, teeth or back) of your pain, or the pain is getting worse.  You also experience nausea, vomiting, sweating (diaphoresis), or shortness of breath.  You develop continual vomiting, vomit blood or coffee ground material, have bright red blood in your stools, or have black tarry stools. Some of these symptoms could signal other problems such as heart disease. MAKE SURE YOU:   Understand these instructions.  Monitor your condition.  Contact your caregiver if you are not doing well or are getting worse. Document Released: 06/19/2003 Document Revised: 06/21/2011 Document Reviewed: 03/29/2005 [facility] Patient Information 2014 [place], [place].

## 2013-08-10 ENCOUNTER — Other Ambulatory Visit: Payer: Self-pay | Admitting: Physical Medicine and Rehabilitation

## 2013-08-10 DIAGNOSIS — K769 Liver disease, unspecified: Secondary | ICD-10-CM

## 2013-08-10 DIAGNOSIS — N289 Disorder of kidney and ureter, unspecified: Secondary | ICD-10-CM

## 2013-08-16 ENCOUNTER — Ambulatory Visit
Admission: RE | Admit: 2013-08-16 | Discharge: 2013-08-16 | Disposition: A | Discharge status: Home / Self Care | Payer: Medicare Other | Source: Ambulatory Visit | Attending: Physical Medicine and Rehabilitation | Admitting: Physical Medicine and Rehabilitation

## 2013-08-16 DIAGNOSIS — K769 Liver disease, unspecified: Secondary | ICD-10-CM

## 2013-08-16 DIAGNOSIS — N289 Disorder of kidney and ureter, unspecified: Secondary | ICD-10-CM

## 2013-08-16 MED ORDER — IOHEXOL 300 MG/ML  SOLN
100.0000 mL | Freq: Once | INTRAMUSCULAR | Status: AC | PRN
  Administered 2013-08-16: 100 mL via INTRAVENOUS

## 2013-08-21 ENCOUNTER — Ambulatory Visit: Payer: Medicare Other | Admitting: Cardiovascular Disease

## 2013-10-04 ENCOUNTER — Ambulatory Visit (INDEPENDENT_AMBULATORY_CARE_PROVIDER_SITE_OTHER): Payer: Medicare Other | Admitting: Family Medicine

## 2013-10-04 VITALS — BP 90/46 | HR 65 | Temp 97.7°F | Resp 16

## 2013-10-04 DIAGNOSIS — R031 Nonspecific low blood-pressure reading: Secondary | ICD-10-CM

## 2013-10-04 DIAGNOSIS — S0990XA Unspecified injury of head, initial encounter: Secondary | ICD-10-CM

## 2013-10-04 DIAGNOSIS — S0100XA Unspecified open wound of scalp, initial encounter: Secondary | ICD-10-CM

## 2013-10-04 DIAGNOSIS — S0101XA Laceration without foreign body of scalp, initial encounter: Secondary | ICD-10-CM

## 2013-10-04 NOTE — Patient Instructions (Addendum)
WOUND CARE Please return in 7 days to have your stitches/staples removed or sooner if you have concerns. Marland Kitchen Keep area clean and dry for 24 hours. Do not remove bandage, if applied. . After 24 hours, remove bandage and wash wound gently with mild soap and warm water. Reapply a new bandage after cleaning wound, if directed. . Continue daily cleansing with soap and water until stitches/staples are removed. . Do not apply any ointments or creams to the wound while stitches/staples are in place, as this may cause delayed healing. . Notify the office if you experience any of the following signs of infection: Swelling, redness, pus drainage, streaking, fever >101.0 F . Notify the office if you experience excessive bleeding that does not stop after 15-20 minutes of constant, firm pressure.  Head Injury You have received a head injury. It does not appear serious at this time. Headaches and vomiting are common following head injury. It should be easy to awaken from sleeping. Sometimes it is necessary for you to stay in the emergency department for a while for observation. Sometimes admission to the hospital may be needed. After injuries such as yours, most problems occur within the first 24 hours, but side effects may occur up to 7-10 days after the injury. It is important for you to carefully monitor your condition and contact your health care Dianelys Scinto or seek immediate medical care if there is a change in your condition. WHAT ARE THE TYPES OF HEAD INJURIES? Head injuries can be as minor as a bump. Some head injuries can be more severe. More severe head injuries include:  A jarring injury to the brain (concussion).  A bruise of the brain (contusion). This mean there is bleeding in the brain that can cause swelling.  A cracked skull (skull fracture).  Bleeding in the brain that collects, clots, and forms a bump (hematoma). WHAT CAUSES A HEAD INJURY? A serious head injury is most likely to  happen to someone who is in a car wreck and is not wearing a seat belt. Other causes of major head injuries include bicycle or motorcycle accidents, sports injuries, and falls. HOW ARE HEAD INJURIES DIAGNOSED? A complete history of the event leading to the injury and your current symptoms will be helpful in diagnosing head injuries. Many times, pictures of the brain, such as CT or MRI are needed to see the extent of the injury. Often, an overnight hospital stay is necessary for observation.  WHEN SHOULD I SEEK IMMEDIATE MEDICAL CARE?  You should get help right away if:  You have confusion or drowsiness.  You feel sick to your stomach (nauseous) or have continued, forceful vomiting.  You have dizziness or unsteadiness that is getting worse.  You have severe, continued headaches not relieved by medicine. Only take over-the-counter or prescription medicines for pain, fever, or discomfort as directed by your health care Rexann Lueras.  You do not have normal function of the arms or legs or are unable to walk.  You notice changes in the black spots in the center of the colored part of your eye (pupil).  You have a clear or bloody fluid coming from your nose or ears.  You have a loss of vision. During the next 24 hours after the injury, you must stay with someone who can watch you for the warning signs. This person should contact local emergency services (911 in the [place] if you have seizures, you become unconscious, or you are unable to wake up. HOW CAN I PREVENT  A HEAD INJURY IN THE FUTURE? The most important factor for preventing major head injuries is avoiding motor vehicle accidents. To minimize the potential for damage to your head, it is crucial to wear seat belts while riding in motor vehicles. Wearing helmets while bike riding and playing collision sports (like football) is also helpful. Also, avoiding dangerous activities around the house will further help reduce your risk of head injury.   WHEN CAN I RETURN TO NORMAL ACTIVITIES AND ATHLETICS? You should be reevaluated by your health care Quincie Haroon before returning to these activities. If you have any of the following symptoms, you should not return to activities or contact sports until 1 week after the symptoms have stopped:  Persistent headache.  Dizziness or vertigo.  Poor attention and concentration.  Confusion.  Memory problems.  Nausea or vomiting.  Fatigue or tire easily.  Irritability.  Intolerant of bright lights or loud noises.  Anxiety or depression.  Disturbed sleep. MAKE SURE YOU:   Understand these instructions.  Will watch your condition.  Will get help right away if you are not doing well or get worse. Document Released: 03/29/2005 Document Revised: 04/03/2013 Document Reviewed: 12/04/2012 [facility] Patient Information 2015 [place], [place]. This information is not intended to replace advice given to you by your health care Geeta Dworkin. Make sure you discuss any questions you have with your health care Cloy Cozzens.

## 2013-10-04 NOTE — Progress Notes (Signed)
Procedure note- verbal informed consent obtained. Lidocaine 1% with epi used to obtain local anesthesia. Area cleaned with soap and water. Adhered skin released and blood escaped. Wound visually explored and palpated. No step off or defect noted. Area of fluctuance resolved. Area closed with 3 stainless steel staples.

## 2013-10-04 NOTE — Progress Notes (Addendum)
Subjective:  This chart was scribed for [name], MD by [name], Medical Scribe. This patient was seen in Room 6 and the patient's care was started at 2:50 PM.   Patient ID: Cindy [name], female    DOB: [birth year], 80 y.o.   MRN: [id number]  HPI HPI Comments: Cindy [name] is a 80 y.o. female who presents to the [facility] complaining of a head laceration that she incurred this afternoon after the patient fell backwards from a stool and onto her dresser handle.  She states that the bleeding is controlled and she applied ice to her head after the fall.  She denies LOC, headache, nausea, vomiting, confusion, dizziness, blurred or double vision, neck pain, chest pain, slurred speech, and numbness or tingling as an associated symptoms.  The patient states that she also fell on 06/2012 after slipping on ice and incurred a head laceration as well.  She states that her last TDAP was last year at the [place].  She states that she took her blood pressure medication today.  She states that she normally takes it in the morning and at dinner time.  The patient's PCP is [name]. Her BP usually runs 130-150s; feels well; normal energy level; no dizziness or lightheadedness.   Past Medical History  Diagnosis Date   GERD (gastroesophageal reflux disease)    Hypertension    Osteoporosis    Malignant melanoma 11/12/04    L upper arm   Hiatal hernia    Murmur, cardiac    Dyslipidemia    Hypothyroidism    Macular degeneration    Lung nodule 07/01/03    right middle lobe   LBBB (left bundle branch block)    Chronic renal insufficiency, stage III (moderate)    History of nuclear stress test 2005    persantine; inferoapical ischmeia with EF 83%   Past Surgical History  Procedure Laterality Date   Total abdominal hysterectomy w/ bilateral salpingoophorectomy     Bladder repair     Rotator cuff repair      right   Cataract extraction      bilat   Knee     right cartilage repair   Coccyx  1951    excision   Cardiac catheterization  2005/2007    normal coronaries    Transthoracic echocardiogram  2007    EF=>55%; borderline conc LVH; LA mod dilated; RA mild-mod dilated; mild TR; mild AVR with mildly sclerotic AV leavlefts; mild PV regurg    Family History  Problem Relation Age of Onset   Cancer Daughter     breast   Cancer Father     NHL   Cancer Maternal Aunt     breast   Cancer Maternal Grandmother     rectal   Cancer Son     melanoma   History   Social History   Marital Status: Married    Spouse Name: N/A    Number of Children: N/A   Years of Education: N/A   Occupational History   Not on file.   Social History Main Topics   Smoking status: Never Smoker    Smokeless tobacco: Not on file   Alcohol Use: No   Drug Use:    Sexual Activity:    Other Topics Concern   Not on file   Social History Narrative   No narrative on file   Allergies  Allergen Reactions   Fish Allergy Hives   Sulfa Antibiotics Other (See  Comments)    Reaction unknown    Review of Systems  Constitutional: Negative for fever, chills, diaphoresis and fatigue.  Eyes: Negative for visual disturbance.  Respiratory: Negative for cough and shortness of breath.   Cardiovascular: Negative for chest pain, palpitations and leg swelling.  Gastrointestinal: Negative for nausea and vomiting.  Musculoskeletal: Negative for neck pain and neck stiffness.  Skin: Positive for color change and wound.  Neurological: Negative for dizziness, tremors, seizures, syncope, facial asymmetry, speech difficulty, weakness, light-headedness, numbness and headaches.  Psychiatric/Behavioral: Negative for confusion.  All other systems reviewed and are negative.    Objective:  Physical Exam  Nursing note and vitals reviewed. Constitutional: She is oriented to person, place, and time. She appears well-developed and well-nourished. No distress.    HENT:  Head: Head is with laceration.  Nose: Nose normal.  Eyes: Conjunctivae and EOM are normal. Pupils are equal, round, and reactive to light.  Neck: Normal range of motion and full passive range of motion without pain. Neck supple. No spinous process tenderness and no muscular tenderness present. Normal range of motion present.  Cardiovascular: Bradycardia present.   Murmur heard.  Systolic murmur is present with a grade of 2/6  Heart rate of 50.   Pulmonary/Chest: Effort normal and breath sounds normal. No respiratory distress. She has no decreased breath sounds. She has no wheezes. She has no rhonchi. She has no rales.  Musculoskeletal: Normal range of motion.  Neurological: She is alert and oriented to person, place, and time. No cranial nerve deficit or sensory deficit. She exhibits normal muscle tone.  Strength 5/5 in all extremities.   Skin: Skin is warm and dry. She is not diaphoretic.  Posterior parietal region midline with a v-shaped laceration with good hemostasis and approximation.  With palpation, defect appreciated under wound with blood being expressed vigorously.   Psychiatric: She has a normal mood and affect. Her behavior is normal. Judgment and thought content normal. Cognition and memory are normal.   BP in exam room: 100/50  BP 90/46   Pulse 65   Temp(Src) 97.7 F (36.5 C) (Oral)   Resp 16   SpO2 94% Assessment & Plan:  Laceration of scalp without complication, initial encounter  Head injury, unspecified  Blood pressure, low, incidental finding  1. Pain head:  New. Secondary to laceration; recommend Tylenol PRN. 2.  Laceration scalp:  New.  S/p staples x 3 to wound; Tetanus UTD.  RTC 7 days for staple removal. 3.  Head injury/contusion:  New.  No evidence of concussion.  Head injury precautions reviewed in detail during visit.  To ED for vomiting, blurred vision, confusion, dizziness.  Rest for the upcomign 48 to 72 hours. 4.  Low blood pressure:  New.  Asymptomatic.  Recommend checking BP later today and in the morning; advised to contact PCP if persistently low BPs.    No orders of the defined types were placed in this encounter.   I personally performed the services described in this documentation, which was scribed in my presence.  The recorded information has been reviewed and is accurate.  [name], M.D.  [facility] [address] [place], [place]  [address] [phone] phone [phone] fax

## 2013-10-11 ENCOUNTER — Ambulatory Visit (INDEPENDENT_AMBULATORY_CARE_PROVIDER_SITE_OTHER): Payer: Medicare Other | Admitting: Physician Assistant

## 2013-10-11 VITALS — BP 110/73 | HR 52 | Temp 98.6°F | Resp 16 | Ht 60.0 in | Wt 110.6 lb

## 2013-10-11 DIAGNOSIS — Z4802 Encounter for removal of sutures: Secondary | ICD-10-CM

## 2013-10-11 NOTE — Progress Notes (Signed)
   Subjective:    Patient ID: Cindy [name], female    DOB: 1922/05/13, 80 y.o.   MRN: [id number]  Suture / Staple Removal     Ms. Druck is a very pleasant 80 yr old female here for staple removal.  Staples were placed here 10/04/13.  Pt reports she is doing very well.  No pain or drainage from the wound.    Review of Systems  Constitutional: Negative for fever and chills.  Skin: Positive for wound.  Neurological: Negative for headaches.       Objective:   Physical Exam  Vitals reviewed. Constitutional: She is oriented to person, place, and time. She appears well-developed and well-nourished. No distress.  HENT:  Head:    Healing wound at scalp; staples removed without difficulty  Pulmonary/Chest: Effort normal.  Neurological: She is alert and oriented to person, place, and time.  Skin: Skin is warm and dry.  Psychiatric: She has a normal mood and affect. Her behavior is normal.       Assessment & Plan:  Encounter for removal of sutures   Ms. Wawrzyniak is a very pleasant 80 yr old female here for staple removal.  Wound is healing very well.  Staples removed.  Follow up if concerns  Pt to call or RTC if worsening or not improving  E. Natividad Brood MHS, PA-C [facility] 7/2/201512:16 PM

## 2013-10-18 ENCOUNTER — Ambulatory Visit (HOSPITAL_BASED_OUTPATIENT_CLINIC_OR_DEPARTMENT_OTHER): Payer: Medicare Other | Admitting: Internal Medicine

## 2013-10-18 ENCOUNTER — Other Ambulatory Visit: Payer: Self-pay | Admitting: Internal Medicine

## 2013-10-18 ENCOUNTER — Encounter: Payer: Self-pay | Admitting: Internal Medicine

## 2013-10-18 ENCOUNTER — Other Ambulatory Visit (HOSPITAL_BASED_OUTPATIENT_CLINIC_OR_DEPARTMENT_OTHER): Payer: Medicare Other

## 2013-10-18 VITALS — BP 149/52 | HR 58 | Temp 97.6°F | Resp 17 | Ht 60.0 in | Wt 108.5 lb

## 2013-10-18 DIAGNOSIS — C4362 Malignant melanoma of left upper limb, including shoulder: Secondary | ICD-10-CM

## 2013-10-18 DIAGNOSIS — Z85820 Personal history of malignant melanoma of skin: Secondary | ICD-10-CM

## 2013-10-18 DIAGNOSIS — I10 Essential (primary) hypertension: Secondary | ICD-10-CM

## 2013-10-18 LAB — CBC WITH DIFFERENTIAL/PLATELET
BASO%: 0.5 % (ref 0.0–2.0)
Basophils Absolute: 0 10*3/uL (ref 0.0–0.1)
EOS%: 3.6 % (ref 0.0–7.0)
Eosinophils Absolute: 0.3 10*3/uL (ref 0.0–0.5)
HCT: 37.6 % (ref 34.8–46.6)
HGB: 12.1 g/dL (ref 11.6–15.9)
LYMPH%: 18.2 % (ref 14.0–49.7)
MCH: 31.3 pg (ref 25.1–34.0)
MCHC: 32.2 g/dL (ref 31.5–36.0)
MCV: 97.2 fL (ref 79.5–101.0)
MONO#: 0.8 10*3/uL (ref 0.1–0.9)
MONO%: 8.5 % (ref 0.0–14.0)
NEUT#: 6.1 10*3/uL (ref 1.5–6.5)
NEUT%: 69.2 % (ref 38.4–76.8)
Platelets: 268 10*3/uL (ref 145–400)
RBC: 3.87 10*6/uL (ref 3.70–5.45)
RDW: 14.1 % (ref 11.2–14.5)
WBC: 8.8 10*3/uL (ref 3.9–10.3)
lymph#: 1.6 10*3/uL (ref 0.9–3.3)

## 2013-10-18 LAB — COMPREHENSIVE METABOLIC PANEL (CC13)
ALT: 13 U/L (ref 0–55)
AST: 19 U/L (ref 5–34)
Albumin: 3.5 g/dL (ref 3.5–5.0)
Alkaline Phosphatase: 72 U/L (ref 40–150)
Anion Gap: 10 mEq/L (ref 3–11)
BUN: 23.6 mg/dL (ref 7.0–26.0)
CO2: 24 mEq/L (ref 22–29)
Calcium: 9.5 mg/dL (ref 8.4–10.4)
Chloride: 108 mEq/L (ref 98–109)
Creatinine: 1.2 mg/dL — ABNORMAL HIGH (ref 0.6–1.1)
Glucose: 99 mg/dl (ref 70–140)
Potassium: 4.5 mEq/L (ref 3.5–5.1)
Sodium: 143 mEq/L (ref 136–145)
Total Bilirubin: 0.41 mg/dL (ref 0.20–1.20)
Total Protein: 7 g/dL (ref 6.4–8.3)

## 2013-10-18 LAB — LACTATE DEHYDROGENASE (CC13): LDH: 221 U/L (ref 125–245)

## 2013-10-18 NOTE — Progress Notes (Signed)
[facility] OFFICE PROGRESS NOTE  [name], MD [place] [address]  DIAGNOSIS: Melanoma of left upper arm  Essential hypertension  Chief Complaint  Patient presents with  . Melanoma of left upper arm    CURRENT TREATMENT: Observation.  INTERVAL HISTORY: Cindy [name] 80 y.o. female with a history of pT2a pN0, IB  superficial spreading malignant melanoma involving the left upper lateral arm with negative sentinel lymph node and clear margins dating back to August 2006 is here for follow up.  She was last seen by Dr. Jilda Roche on 10/19/2012.    She is now is 80 years old and overall health seems to be excellent. She is quite mentally sharp, drives, is self-sufficient. Her husband continues to have some health problems lately due to urinary problems and frequent falls.   The patient denies any symptoms to suggest recurrence of her melanoma. She has had some problems with her back. She has been followed by Dr. Girard Cooter at [facility] for a lung nodule that apparently has a low likelihood for malignancy. She reports that Dr. Girard Cooter denies any further growth of the lung nodule. She denies any complaints.   MEDICAL HISTORY: Past Medical History  Diagnosis Date  . GERD (gastroesophageal reflux disease)   . Hypertension   . Osteoporosis   . Malignant melanoma 11/12/04    L upper arm  . Hiatal hernia   . Murmur, cardiac   . Dyslipidemia   . Hypothyroidism   . Macular degeneration   . Lung nodule 07/01/03    right middle lobe  . LBBB (left bundle branch block)   . Chronic renal insufficiency, stage III (moderate)   . History of nuclear stress test 2005    persantine; inferoapical ischmeia with EF 83%    INTERIM HISTORY: has Melanoma of left upper arm; Left bundle branch block; Essential hypertension; and Hyperlipidemia on her problem list.    ALLERGIES:  is allergic to fish allergy and sulfa  antibiotics.  MEDICATIONS: has a current medication list which includes the following prescription(s): aspirin, calcium carbonate, cranberry, cyanocobalamin, esomeprazole, furosemide, glucosamine-chondroitin, krill oil omega-3, levothyroxine, loratadine, multivitamin, multiple vitamins-minerals, NON FORMULARY, ondansetron, oxybutynin, simvastatin, valsartan, and vitamin c.  SURGICAL HISTORY:  Past Surgical History  Procedure Laterality Date  . Total abdominal hysterectomy w/ bilateral salpingoophorectomy    . Bladder repair    . Rotator cuff repair      right  . Cataract extraction      bilat  . Knee      right cartilage repair  . Coccyx  1951    excision  . Cardiac catheterization  2005/2007    normal coronaries   . Transthoracic echocardiogram  2007    EF=>55%; borderline conc LVH; LA mod dilated; RA mild-mod dilated; mild TR; mild AVR with mildly sclerotic AV leavlefts; mild PV regurg    PROBLEM LIST:  1. Superficial spreading malignant melanoma 1.16 mm Breslow's measurement and Clark's level IV located on the left upper lateral arm with biopsy on 11/12/2004. There was no ulceration. Re-excision and a sentinel lymph node biopsy revealed negative margins and no tumor in the 1 lymph node that was removed on 12/02/2004. Pathologic stage was T2a N0, stage IB.  2. Hiatal hernia.  3. Systolic ejection murmur.  4. Diverticulosis.  5. Gastroesophageal reflux disease.  6. Hypertension.  7. Osteoporosis with compression fractures.  8. Dyslipidemia.  9. Hypothyroidism.  10.Macular degeneration.  11.Right middle lobe  pulmonary nodule.  12.Right middle lobe lung nodule.  13.Positive family history of breast cancer in the patient's daughter and maternal aunt with apparent negative genetic testing in the patient's daughter.   REVIEW OF SYSTEMS:   Constitutional: Denies fevers, chills or abnormal weight loss Eyes: Denies blurriness of vision Ears, nose, mouth, throat, and face: Denies  mucositis or sore throat Respiratory: Denies cough, dyspnea or wheezes Cardiovascular: Denies palpitation, chest discomfort or lower extremity swelling Gastrointestinal:  Denies nausea, heartburn or change in bowel habits Skin: Denies abnormal skin rashes Lymphatics: Denies new lymphadenopathy or easy bruising Neurological:Denies numbness, tingling or new weaknesses Behavioral/Psych: Mood is stable, no new changes  All other systems were reviewed with the patient and are negative.  PHYSICAL EXAMINATION: ECOG PERFORMANCE STATUS: 1 - Symptomatic but completely ambulatory  Blood pressure 149/52, pulse 58, temperature 97.6 F (36.4 C), temperature source Oral, resp. rate 17, height 5' (1.524 m), weight 108 lb 8 oz (49.215 kg), SpO2 98.00%.  GENERAL:alert, no distress and comfortable; well developed and well nourished and easily mobile to exam table.  SKIN: skin color, texture, turgor are normal, no rashes or significant lesions; 2 cm x 1 cm abrasion of left leg.  EYES: normal, Conjunctiva are pink and non-injected, sclera clear OROPHARYNX:no exudate, no erythema and lips, buccal mucosa, and tongue normal  NECK: supple, thyroid normal size, non-tender, without nodularity LYMPH:  no palpable lymphadenopathy in the cervical, axillary or supraclavicular LUNGS: clear to auscultation with normal breathing effort, no wheezes or rhonchi HEART: regular rate & rhythm and no murmurs and no lower extremity edema ABDOMEN:abdomen soft, non-tender and normal bowel sounds Musculoskeletal:no cyanosis of digits and no clubbing  NEURO: alert & oriented x 3 with fluent speech, no focal motor/sensory deficits  Labs:  Lab Results  Component Value Date   WBC 8.8 10/18/2013   HGB 12.1 10/18/2013   HCT 37.6 10/18/2013   MCV 97.2 10/18/2013   PLT 268 10/18/2013   NEUTROABS 6.1 10/18/2013      Chemistry      Component Value Date/Time   NA 143 10/18/2013 1222   NA 141 07/25/2013 2334   K 4.5 10/18/2013 1222   K 4.9  07/25/2013 2334   CL 104 07/25/2013 2334   CO2 24 10/18/2013 1222   CO2 21 07/25/2013 2334   BUN 23.6 10/18/2013 1222   BUN 35* 07/25/2013 2334   CREATININE 1.2* 10/18/2013 1222   CREATININE 1.35* 07/25/2013 2334      Component Value Date/Time   CALCIUM 9.5 10/18/2013 1222   CALCIUM 10.5 07/25/2013 2334   ALKPHOS 72 10/18/2013 1222   ALKPHOS 58 07/25/2013 2334   AST 19 10/18/2013 1222   AST 25 07/25/2013 2334   ALT 13 10/18/2013 1222   ALT 18 07/25/2013 2334   BILITOT 0.41 10/18/2013 1222   BILITOT 0.3 07/25/2013 2334       Basic Metabolic Panel:  Recent Labs Lab 10/18/13 1222  NA 143  K 4.5  CO2 24  GLUCOSE 99  BUN 23.6  CREATININE 1.2*  CALCIUM 9.5   GFR Estimated Creatinine Clearance: 22.8 ml/min (by C-G formula based on Cr of 1.2). Liver Function Tests:  Recent Labs Lab 10/18/13 1222  AST 19  ALT 13  ALKPHOS 72  BILITOT 0.41  PROT 7.0  ALBUMIN 3.5   No results found for this basename: LIPASE, AMYLASE,  in the last 168 hours No results found for this basename: AMMONIA,  in the last 168 hours Coagulation profile No results  found for this basename: INR, PROTIME,  in the last 168 hours  CBC:  Recent Labs Lab 10/18/13 1222  WBC 8.8  NEUTROABS 6.1  HGB 12.1  HCT 37.6  MCV 97.2  PLT 268    Anemia work up No results found for this basename: VITAMINB12, FOLATE, FERRITIN, TIBC, IRON, RETICCTPCT,  in the last 72 hours  Studies:  No results found.   RADIOGRAPHIC STUDIES: No results found.  ASSESSMENT: Annamary Loebig 80 y.o. female with a history of Melanoma of left upper arm  Essential hypertension   PLAN:   1. Melanoma of left upper arm. --Ms. Cerullo is now out 9 years from the time of  diagnosis of her thin melanoma involving the left upper arm. There was no evidence for recurrence of disease. She is doing quite well with no major ongoing health issues at this time.   2. Left leg abrasion. --She is being followed by the wound center and will follow up next  week. It does not appear to show signs of purulence.   3. Follow up.  --We will plan to see Ms. Crary again in 1 year at which time we will check CBC and chemistries and LDH.  All questions were answered. The patient knows to call the clinic with any problems, questions or concerns. We can certainly see the patient much sooner if necessary.  I spent 15 minutes counseling the patient face to face. The total time spent in the appointment was 25 minutes.    Mirka Barbone, MD 10/18/2013 1:26 PM

## 2013-10-19 ENCOUNTER — Telehealth: Payer: Self-pay | Admitting: Internal Medicine

## 2013-10-19 ENCOUNTER — Ambulatory Visit (INDEPENDENT_AMBULATORY_CARE_PROVIDER_SITE_OTHER): Payer: Medicare Other | Admitting: Cardiovascular Disease

## 2013-10-19 ENCOUNTER — Encounter: Payer: Self-pay | Admitting: Cardiovascular Disease

## 2013-10-19 VITALS — BP 156/80 | HR 62 | Ht 61.0 in | Wt 107.5 lb

## 2013-10-19 DIAGNOSIS — E785 Hyperlipidemia, unspecified: Secondary | ICD-10-CM

## 2013-10-19 DIAGNOSIS — I10 Essential (primary) hypertension: Secondary | ICD-10-CM

## 2013-10-19 NOTE — Progress Notes (Signed)
10/19/2013 Cindy [name]   1922/09/23  ID:[id number]  Primary Physician [name], MD Primary Cardiologist: Lorretta Harp MD Renae Gloss   HPI:  The patient returns today for annual followup. She is a delightful 41 and-year-old thin and frail appearing married Caucasian female whose husband is also a patient of mine and is accompanying her today. I last saw her a year ago. She has a history of normal coronary arteries by cath in 2005 and again in 2007. She has chronic left bundle branch block, hypertension and hyperlipidemia. She also has GERD, followed by Dr. Carol Ada with an endoscopy in the past. [name] followed her lipid profile. Since I saw her back in the office 12/14/12 she's remained clinically stable specifically denying chest pain or shortness of breath.     Current Outpatient Prescriptions  Medication Sig Dispense Refill  . aspirin 81 MG tablet Take 81 mg by mouth daily.      . Calcium Carbonate (CALTRATE 600 PO) Take 1,200 mg by mouth 2 (two) times daily.       Marland Kitchen CRANBERRY PO Take 1 tablet by mouth 3 (three) times daily.      . cyanocobalamin 500 MCG tablet Take 500 mcg by mouth daily.      Marland Kitchen esomeprazole (NEXIUM) 40 MG capsule Take 1 capsule (40 mg total) by mouth daily.  30 capsule  1  . furosemide (LASIX) 20 MG tablet Take 20 mg by mouth daily.      . Glucosamine-Chondroitin (MOVE FREE PO) Take 2 tablets by mouth daily.      Astrid Drafts Omega-3 300 MG CAPS Take 1 capsule by mouth daily.      Marland Kitchen levothyroxine (SYNTHROID, LEVOTHROID) 25 MCG tablet Take 75 mcg by mouth daily.      Marland Kitchen loratadine (CLARITIN) 10 MG tablet Take 10 mg by mouth daily.      . Multiple Vitamin (MULTIVITAMIN) capsule Take 1 capsule by mouth daily.      . Multiple Vitamins-Minerals (ICAPS MV PO) Take by mouth.      . NON FORMULARY Hair, skin, and nail vitamin 5070mcg 3 tablets daily      . ondansetron (ZOFRAN) 4 MG tablet Take 1 tablet (4 mg total) by mouth every 6 (six)  hours.  20 tablet  1  . oxybutynin (DITROPAN) 5 MG tablet Take 5 mg by mouth daily.      . simvastatin (ZOCOR) 40 MG tablet Take 40 mg by mouth every evening.      . valsartan (DIOVAN) 320 MG tablet Take 320 mg by mouth daily.      . vitamin C (ASCORBIC ACID) 500 MG tablet Take 500 mg by mouth daily.       No current facility-administered medications for this visit.    Allergies  Allergen Reactions  . Fish Allergy Hives  . Sulfa Antibiotics Other (See Comments)    Reaction unknown    History   Social History  . Marital Status: Married    Spouse Name: N/A    Number of Children: N/A  . Years of Education: N/A   Occupational History  . Not on file.   Social History Main Topics  . Smoking status: Never Smoker   . Smokeless tobacco: Not on file  . Alcohol Use: No  . Drug Use:   . Sexual Activity:    Other Topics Concern  . Not on file   Social History Narrative  . No narrative on file  Review of Systems: General: negative for chills, fever, night sweats or weight changes.  Cardiovascular: negative for chest pain, dyspnea on exertion, edema, orthopnea, palpitations, paroxysmal nocturnal dyspnea or shortness of breath Dermatological: negative for rash Respiratory: negative for cough or wheezing Urologic: negative for hematuria Abdominal: negative for nausea, vomiting, diarrhea, bright red blood per rectum, melena, or hematemesis Neurologic: negative for visual changes, syncope, or dizziness All other systems reviewed and are otherwise negative except as noted above.    Blood pressure 156/80, pulse 62, height 5\' 1"  (1.549 m), weight 107 lb 8 oz (48.762 kg).  General appearance: alert and no distress Neck: no adenopathy, no carotid bruit, no JVD, supple, symmetrical, trachea midline and thyroid not enlarged, symmetric, no tenderness/mass/nodules Lungs: clear to auscultation bilaterally Heart: soft outflow tract murmur Extremities: extremities normal, atraumatic, no  cyanosis or edema  EKG not performed today, and history of known chronic left bundle branch block  ASSESSMENT AND PLAN:   Essential hypertension Controlled on current medications  Hyperlipidemia On statin therapy followed by her PCP      Lorretta Harp MD [facility], [place] 10/19/2013 10:30 AM

## 2013-10-19 NOTE — Patient Instructions (Signed)
Your physician recommends that you schedule a follow-up appointment in: One year with Dr.Berry

## 2013-10-19 NOTE — Telephone Encounter (Signed)
lvm for pt regarding to July appt. °

## 2013-10-19 NOTE — Assessment & Plan Note (Signed)
On statin therapy followed by her PCP 

## 2013-10-19 NOTE — Assessment & Plan Note (Signed)
Controlled on current medications 

## 2013-10-25 ENCOUNTER — Encounter (HOSPITAL_BASED_OUTPATIENT_CLINIC_OR_DEPARTMENT_OTHER): Payer: Medicare Other | Attending: Internal Medicine

## 2013-11-18 ENCOUNTER — Ambulatory Visit (INDEPENDENT_AMBULATORY_CARE_PROVIDER_SITE_OTHER): Payer: Medicare Other | Admitting: Emergency Medicine

## 2013-11-18 VITALS — BP 168/76 | HR 50 | Temp 97.6°F | Resp 16 | Ht 60.0 in | Wt 109.2 lb

## 2013-11-18 DIAGNOSIS — Z23 Encounter for immunization: Secondary | ICD-10-CM

## 2013-11-18 DIAGNOSIS — T148XXA Other injury of unspecified body region, initial encounter: Secondary | ICD-10-CM

## 2013-11-18 DIAGNOSIS — IMO0002 Reserved for concepts with insufficient information to code with codable children: Secondary | ICD-10-CM

## 2013-11-18 NOTE — Patient Instructions (Signed)

## 2013-11-18 NOTE — Progress Notes (Signed)
[facility] [address] [place] [address] [phone]  Date:  11/18/2013   Name:  Cindy [name]   DOB:  1923/01/02   MRN:  [id number]  PCP:  [name], MD    Chief Complaint: Wrist Injury   History of Present Illness:  Cindy [name] is a 80 y.o. very pleasant female patient who presents with the following:  Injured two weeks ago with a laceration right ulnar wrist.  She treated it with a band aid and it started to bleed again today.   Uncertain last tetanus No recent reinjury.   Denies other complaint or health concern today.   Patient Active Problem List   Diagnosis Date Noted  . Left bundle branch block 12/14/2012  . Essential hypertension 12/14/2012  . Hyperlipidemia 12/14/2012  . Melanoma of left upper arm 10/19/2011    Past Medical History  Diagnosis Date  . GERD (gastroesophageal reflux disease)   . Hypertension   . Osteoporosis   . Malignant melanoma 11/12/04    L upper arm  . Hiatal hernia   . Murmur, cardiac   . Dyslipidemia   . Hypothyroidism   . Macular degeneration   . Lung nodule 07/01/03    right middle lobe  . LBBB (left bundle branch block)   . Chronic renal insufficiency, stage III (moderate)   . History of nuclear stress test 2005    persantine; inferoapical ischmeia with EF 83%    Past Surgical History  Procedure Laterality Date  . Total abdominal hysterectomy w/ bilateral salpingoophorectomy    . Bladder repair    . Rotator cuff repair      right  . Cataract extraction      bilat  . Knee      right cartilage repair  . Coccyx  1951    excision  . Cardiac catheterization  2005/2007    normal coronaries   . Transthoracic echocardiogram  2007    EF=>55%; borderline conc LVH; LA mod dilated; RA mild-mod dilated; mild TR; mild AVR with mildly sclerotic AV leavlefts; mild PV regurg     History  Substance Use Topics  . Smoking status: Never Smoker   . Smokeless tobacco: Not on file  . Alcohol Use: No     Family History  Problem Relation Age of Onset  . Cancer Daughter     breast  . Cancer Father     NHL  . Cancer Maternal Aunt     breast  . Cancer Maternal Grandmother     rectal  . Cancer Son     melanoma    Allergies  Allergen Reactions  . Fish Allergy Hives  . Sulfa Antibiotics Other (See Comments)    Reaction unknown    Medication list has been reviewed and updated.  Current Outpatient Prescriptions on File Prior to Visit  Medication Sig Dispense Refill  . aspirin 81 MG tablet Take 81 mg by mouth daily.      . Calcium Carbonate (CALTRATE 600 PO) Take 1,200 mg by mouth 2 (two) times daily.       Marland Kitchen CRANBERRY PO Take 1 tablet by mouth 3 (three) times daily.      . cyanocobalamin 500 MCG tablet Take 500 mcg by mouth daily.      Marland Kitchen esomeprazole (NEXIUM) 40 MG capsule Take 1 capsule (40 mg total) by mouth daily.  30 capsule  1  . furosemide (LASIX) 20 MG tablet Take 20 mg by mouth daily.      Marland Kitchen  Glucosamine-Chondroitin (MOVE FREE PO) Take 2 tablets by mouth daily.      Astrid Drafts Omega-3 300 MG CAPS Take 1 capsule by mouth daily.      Marland Kitchen levothyroxine (SYNTHROID, LEVOTHROID) 25 MCG tablet Take 75 mcg by mouth daily.      Marland Kitchen loratadine (CLARITIN) 10 MG tablet Take 10 mg by mouth daily.      . Multiple Vitamin (MULTIVITAMIN) capsule Take 1 capsule by mouth daily.      . Multiple Vitamins-Minerals (ICAPS MV PO) Take by mouth.      . NON FORMULARY Hair, skin, and nail vitamin 5055mcg 3 tablets daily      . ondansetron (ZOFRAN) 4 MG tablet Take 1 tablet (4 mg total) by mouth every 6 (six) hours.  20 tablet  1  . oxybutynin (DITROPAN) 5 MG tablet Take 5 mg by mouth daily.      . simvastatin (ZOCOR) 40 MG tablet Take 40 mg by mouth every evening.      . valsartan (DIOVAN) 320 MG tablet Take 320 mg by mouth daily.      . vitamin C (ASCORBIC ACID) 500 MG tablet Take 500 mg by mouth daily.       No current facility-administered medications on file prior to visit.    Review of  Systems:  As per HPI, otherwise negative.    Physical Examination: Filed Vitals:   11/18/13 1115  BP: 168/76  Pulse: 50  Temp: 97.6 F (36.4 C)  Resp: 16   Filed Vitals:   11/18/13 1115  Height: 5' (1.524 m)  Weight: 109 lb 3.2 oz (49.533 kg)   Body mass index is 21.33 kg/(m^2). Ideal Body Weight: Weight in (lb) to have BMI = 25: 127.7   GEN: WDWN, NAD, Non-toxic, Alert & Oriented x 3 HEENT: Atraumatic, Normocephalic.  Ears and Nose: No external deformity. EXTR: No clubbing/cyanosis/edema NEURO: Normal gait.  PSYCH: Normally interactive. Conversant. Not depressed or anxious appearing.  Calm demeanor.  Laceration right wrist. NATI.  No cellulitis or FB.  Assessment and Plan: Laceration wrist. Steri strips TDAP   Signed,  Ellison Carwin, MD

## 2013-11-22 ENCOUNTER — Emergency Department (HOSPITAL_COMMUNITY)
Admission: EM | Admit: 2013-11-22 | Discharge: 2013-11-23 | Disposition: A | Discharge status: Home / Self Care | Payer: Medicare Other | Attending: Emergency Medicine | Admitting: Emergency Medicine

## 2013-11-22 ENCOUNTER — Emergency Department (HOSPITAL_COMMUNITY): Payer: Medicare Other

## 2013-11-22 ENCOUNTER — Encounter (HOSPITAL_COMMUNITY): Payer: Self-pay | Admitting: Emergency Medicine

## 2013-11-22 ENCOUNTER — Ambulatory Visit (INDEPENDENT_AMBULATORY_CARE_PROVIDER_SITE_OTHER): Payer: Medicare Other | Admitting: Family Medicine

## 2013-11-22 VITALS — BP 190/110 | HR 100 | Temp 97.6°F | Resp 20 | Ht 61.0 in | Wt 108.6 lb

## 2013-11-22 DIAGNOSIS — E039 Hypothyroidism, unspecified: Secondary | ICD-10-CM | POA: Insufficient documentation

## 2013-11-22 DIAGNOSIS — K219 Gastro-esophageal reflux disease without esophagitis: Secondary | ICD-10-CM | POA: Diagnosis not present

## 2013-11-22 DIAGNOSIS — Z9889 Other specified postprocedural states: Secondary | ICD-10-CM | POA: Diagnosis not present

## 2013-11-22 DIAGNOSIS — R1033 Periumbilical pain: Secondary | ICD-10-CM | POA: Diagnosis present

## 2013-11-22 DIAGNOSIS — R011 Cardiac murmur, unspecified: Secondary | ICD-10-CM | POA: Diagnosis not present

## 2013-11-22 DIAGNOSIS — Z7982 Long term (current) use of aspirin: Secondary | ICD-10-CM | POA: Diagnosis not present

## 2013-11-22 DIAGNOSIS — N390 Urinary tract infection, site not specified: Secondary | ICD-10-CM | POA: Insufficient documentation

## 2013-11-22 DIAGNOSIS — Z79899 Other long term (current) drug therapy: Secondary | ICD-10-CM | POA: Insufficient documentation

## 2013-11-22 DIAGNOSIS — Z8739 Personal history of other diseases of the musculoskeletal system and connective tissue: Secondary | ICD-10-CM | POA: Insufficient documentation

## 2013-11-22 DIAGNOSIS — Z792 Long term (current) use of antibiotics: Secondary | ICD-10-CM | POA: Diagnosis not present

## 2013-11-22 DIAGNOSIS — N183 Chronic kidney disease, stage 3 unspecified: Secondary | ICD-10-CM | POA: Diagnosis not present

## 2013-11-22 DIAGNOSIS — E785 Hyperlipidemia, unspecified: Secondary | ICD-10-CM | POA: Diagnosis not present

## 2013-11-22 DIAGNOSIS — I129 Hypertensive chronic kidney disease with stage 1 through stage 4 chronic kidney disease, or unspecified chronic kidney disease: Secondary | ICD-10-CM | POA: Insufficient documentation

## 2013-11-22 DIAGNOSIS — Z90710 Acquired absence of both cervix and uterus: Secondary | ICD-10-CM | POA: Diagnosis not present

## 2013-11-22 DIAGNOSIS — Z85820 Personal history of malignant melanoma of skin: Secondary | ICD-10-CM | POA: Diagnosis not present

## 2013-11-22 DIAGNOSIS — R109 Unspecified abdominal pain: Secondary | ICD-10-CM

## 2013-11-22 DIAGNOSIS — R110 Nausea: Secondary | ICD-10-CM

## 2013-11-22 LAB — CBC WITH DIFFERENTIAL/PLATELET
Basophils Absolute: 0 10*3/uL (ref 0.0–0.1)
Basophils Relative: 0 % (ref 0–1)
Eosinophils Absolute: 0.1 10*3/uL (ref 0.0–0.7)
Eosinophils Relative: 1 % (ref 0–5)
HCT: 39.6 % (ref 36.0–46.0)
Hemoglobin: 13 g/dL (ref 12.0–15.0)
Lymphocytes Relative: 8 % — ABNORMAL LOW (ref 12–46)
Lymphs Abs: 0.9 10*3/uL (ref 0.7–4.0)
MCH: 31.3 pg (ref 26.0–34.0)
MCHC: 32.8 g/dL (ref 30.0–36.0)
MCV: 95.2 fL (ref 78.0–100.0)
Monocytes Absolute: 0.5 10*3/uL (ref 0.1–1.0)
Monocytes Relative: 5 % (ref 3–12)
Neutro Abs: 8.7 10*3/uL — ABNORMAL HIGH (ref 1.7–7.7)
Neutrophils Relative %: 86 % — ABNORMAL HIGH (ref 43–77)
Platelets: 221 10*3/uL (ref 150–400)
RBC: 4.16 MIL/uL (ref 3.87–5.11)
RDW: 13.7 % (ref 11.5–15.5)
WBC: 10.2 10*3/uL (ref 4.0–10.5)

## 2013-11-22 LAB — COMPREHENSIVE METABOLIC PANEL
ALT: 13 U/L (ref 0–35)
AST: 21 U/L (ref 0–37)
Albumin: 3.8 g/dL (ref 3.5–5.2)
Alkaline Phosphatase: 66 U/L (ref 39–117)
Anion gap: 17 — ABNORMAL HIGH (ref 5–15)
BUN: 35 mg/dL — ABNORMAL HIGH (ref 6–23)
CO2: 24 mEq/L (ref 19–32)
Calcium: 11.8 mg/dL — ABNORMAL HIGH (ref 8.4–10.5)
Chloride: 98 mEq/L (ref 96–112)
Creatinine, Ser: 1.49 mg/dL — ABNORMAL HIGH (ref 0.50–1.10)
GFR calc Af Amer: 35 mL/min — ABNORMAL LOW (ref >90)
GFR calc non Af Amer: 30 mL/min — ABNORMAL LOW (ref >90)
Glucose, Bld: 126 mg/dL — ABNORMAL HIGH (ref 70–99)
Potassium: 4.7 mEq/L (ref 3.7–5.3)
Sodium: 139 mEq/L (ref 137–147)
Total Bilirubin: 0.3 mg/dL (ref 0.3–1.2)
Total Protein: 8.1 g/dL (ref 6.0–8.3)

## 2013-11-22 LAB — URINALYSIS, ROUTINE W REFLEX MICROSCOPIC
Bilirubin Urine: NEGATIVE
Glucose, UA: NEGATIVE mg/dL
Ketones, ur: NEGATIVE mg/dL
Nitrite: NEGATIVE
Protein, ur: NEGATIVE mg/dL
Specific Gravity, Urine: 1.019 (ref 1.005–1.030)
Urobilinogen, UA: 0.2 mg/dL (ref 0.0–1.0)
pH: 5 (ref 5.0–8.0)

## 2013-11-22 LAB — URINE MICROSCOPIC-ADD ON

## 2013-11-22 LAB — TROPONIN I: Troponin I: <0.3 ng/mL (ref <0.30)

## 2013-11-22 LAB — LIPASE, BLOOD: Lipase: 55 U/L (ref 11–59)

## 2013-11-22 MED ORDER — SODIUM CHLORIDE 0.9 % IV BOLUS (SEPSIS): 250.0000 mL | Freq: Once | INTRAVENOUS | Status: DC

## 2013-11-22 MED ORDER — IOHEXOL 300 MG/ML  SOLN
25.0000 mL | Freq: Once | INTRAMUSCULAR | Status: AC | PRN
  Administered 2013-11-22: 25 mL via ORAL

## 2013-11-22 NOTE — Progress Notes (Signed)
Cindy [name] is a 80 y.o. female who presents to Urgent Care today with complaints of abdominal pain:  1.  Abdominal pain:  Patient in her usual good state of health until today at lunch.  Abdominal pain started today about 1:30 after eating lunch.  Described as "feeling like indigestion" initially, then progressed to "terrible pain in my stomach."  Describes both pain as sharp stabbing pain in epigastrum and persistent nausea.  Persistent pain.  Worsening/progressing as day goes on.  Denies ever having pain like this before.  No diarrhea.    No fevers or chills.  History of abdominal hysterectomy years ago.  States husband is in rehab, went to visit him but had to leave due to pain.  Was given Pepto-bismol but no help.  Last BM was this AM, prior to initiation of pain.  Passed gas on way over here, but last episode was about 3 hours ago.  Feels distended.   PMH reviewed.  Past Medical History  Diagnosis Date  . GERD (gastroesophageal reflux disease)   . Hypertension   . Osteoporosis   . Malignant melanoma 11/12/04    L upper arm  . Hiatal hernia   . Murmur, cardiac   . Dyslipidemia   . Hypothyroidism   . Macular degeneration   . Lung nodule 07/01/03    right middle lobe  . LBBB (left bundle branch block)   . Chronic renal insufficiency, stage III (moderate)   . History of nuclear stress test 2005    persantine; inferoapical ischmeia with EF 83%   Past Surgical History  Procedure Laterality Date  . Total abdominal hysterectomy w/ bilateral salpingoophorectomy    . Bladder repair    . Rotator cuff repair      right  . Cataract extraction      bilat  . Knee      right cartilage repair  . Coccyx  1951    excision  . Cardiac catheterization  2005/2007    normal coronaries   . Transthoracic echocardiogram  2007    EF=>55%; borderline conc LVH; LA mod dilated; RA mild-mod dilated; mild TR; mild AVR with mildly sclerotic AV leavlefts; mild PV regurg     Medications  reviewed. Current Outpatient Prescriptions  Medication Sig Dispense Refill  . aspirin 81 MG tablet Take 81 mg by mouth daily.      . Calcium Carbonate (CALTRATE 600 PO) Take 1,200 mg by mouth 2 (two) times daily.       Marland Kitchen CRANBERRY PO Take 1 tablet by mouth 3 (three) times daily.      . cyanocobalamin 500 MCG tablet Take 500 mcg by mouth daily.      Marland Kitchen esomeprazole (NEXIUM) 40 MG capsule Take 1 capsule (40 mg total) by mouth daily.  30 capsule  1  . furosemide (LASIX) 20 MG tablet Take 20 mg by mouth daily.      . Glucosamine-Chondroitin (MOVE FREE PO) Take 2 tablets by mouth daily.      Astrid Drafts Omega-3 300 MG CAPS Take 1 capsule by mouth daily.      Marland Kitchen levothyroxine (SYNTHROID, LEVOTHROID) 25 MCG tablet Take 75 mcg by mouth daily.      Marland Kitchen loratadine (CLARITIN) 10 MG tablet Take 10 mg by mouth daily.      . Multiple Vitamin (MULTIVITAMIN) capsule Take 1 capsule by mouth daily.      . Multiple Vitamins-Minerals (ICAPS MV PO) Take by mouth.      Marland Kitchen NON  FORMULARY Hair, skin, and nail vitamin 5020mcg 3 tablets daily      . ondansetron (ZOFRAN) 4 MG tablet Take 1 tablet (4 mg total) by mouth every 6 (six) hours.  20 tablet  1  . oxybutynin (DITROPAN) 5 MG tablet Take 5 mg by mouth daily.      . simvastatin (ZOCOR) 40 MG tablet Take 40 mg by mouth every evening.      . valsartan (DIOVAN) 320 MG tablet Take 320 mg by mouth daily.      . vitamin C (ASCORBIC ACID) 500 MG tablet Take 500 mg by mouth daily.       No current facility-administered medications for this visit.    ROS as above otherwise neg.  No chest pain, palpitations, SOB, Fever, Chills.  Physical Exam:  BP 190/110  Pulse 100  Temp(Src) 97.6 F (36.4 C) (Oral)  Resp 20  Ht 5\' 1"  (1.549 m)  Wt 108 lb 9.6 oz (49.261 kg)  BMI 20.53 kg/m2  SpO2 95% Gen:  Alert, cooperative patient.  Appears stated age.  In moderate distress, persistently burping with kidney basin at her side with only mucus. Vital signs reviewed. HEENT: EOMI,  PERRL. Hearing acuity intact. MMM. Neck:  Thyroid midline Pulm:  Clear to auscultation bilaterally with good air movement.   Cardiac:  Tachycardic, regular rhythm.  Grade III/VI SEM noted BL upper sternal borders.   Abd:  Soft.  Distended.  TTP throughout but worse umbilicus. Hyperactive, roiling bowel sounds.  No guarding or rebound. Exts: Non edematous BL  LE, warm and well perfused.  Neuro:  No focal deficits Psych:  Pleasant, conversant.    Assessment and Plan: 1.  Severe abdominal pain  - I am concerned for bowel obstruction in this previously healthy 80 yo F with prior abdominal surgery with sudden severe pain - Also with nausea, inabilty to vomit.  No flatulence.   - Not yet acute surgical abdomen, but I am concerned she is headed that direction quickly. - Called EMS.  Sent directly to [facility].  Called and spoke with charge nurse at [facility], they are waiting on her.   2.  Recent fall: - recent fall, hit head on metal door handle.   - No complications, no headaches or consciousness changes.  Alveda Reasons, MD 11/22/2013 7:57 PM

## 2013-11-22 NOTE — ED Notes (Signed)
CT notified pt has completed contrast.

## 2013-11-22 NOTE — ED Provider Notes (Signed)
CSN: [id number]     Arrival date & time 11/22/13  2015 History   First MD Initiated Contact with Patient 11/22/13 2024     Chief Complaint  Patient presents with  . Abdominal Pain     (Consider location/radiation/quality/duration/timing/severity/associated sxs/prior Treatment) The history is provided by the patient. No language interpreter was used.  Cindy [name] is an 80 year old female with past medical history of GERD, hypertension, hiatal hernia, left bundle branch block, stage III renal insufficiency presenting to the ED with abdominal pain. Patient reports that the abdominal pain is localized to the center of her abdomen described as a cramping, strong, aching pain. Stated that at approximately 12:30 PM this afternoon patient ate at [employer] for lunch with daughter-reported approximately one hour after eating she started to have abdominal discomfort. Stated that she took Tums with minimal relief. Reported that she's been passing gas without difficulty. Reported that she feels bloated. Denied history of abdomen. Denied nausea, vomiting, diarrhea, neck pain, back pain, chest pain, shortness of breath, difficulty breathing, fever, chills, melena, hematochezia. PCP [name]  Past Medical History  Diagnosis Date  . GERD (gastroesophageal reflux disease)   . Hypertension   . Osteoporosis   . Malignant melanoma 11/12/04    L upper arm  . Hiatal hernia   . Murmur, cardiac   . Dyslipidemia   . Hypothyroidism   . Macular degeneration   . Lung nodule 07/01/03    right middle lobe  . LBBB (left bundle branch block)   . Chronic renal insufficiency, stage III (moderate)   . History of nuclear stress test 2005    persantine; inferoapical ischmeia with EF 83%   Past Surgical History  Procedure Laterality Date  . Total abdominal hysterectomy w/ bilateral salpingoophorectomy    . Bladder repair    . Rotator cuff repair      right  . Cataract extraction      bilat  . Knee      right  cartilage repair  . Coccyx  1951    excision  . Cardiac catheterization  2005/2007    normal coronaries   . Transthoracic echocardiogram  2007    EF=>55%; borderline conc LVH; LA mod dilated; RA mild-mod dilated; mild TR; mild AVR with mildly sclerotic AV leavlefts; mild PV regurg    Family History  Problem Relation Age of Onset  . Cancer Daughter     breast  . Cancer Father     NHL  . Cancer Maternal Aunt     breast  . Cancer Maternal Grandmother     rectal  . Cancer Son     melanoma   History  Substance Use Topics  . Smoking status: Never Smoker   . Smokeless tobacco: Not on file  . Alcohol Use: No   OB History   Grav Para Term Preterm Abortions TAB SAB Ect Mult Living                 Review of Systems  Constitutional: Negative for fever and chills.  Respiratory: Negative for chest tightness and shortness of breath.   Cardiovascular: Negative for chest pain.  Gastrointestinal: Positive for abdominal pain. Negative for nausea, vomiting, diarrhea, constipation, blood in stool and anal bleeding.  Genitourinary: Negative for decreased urine volume.  Musculoskeletal: Negative for back pain, neck pain and neck stiffness.  Neurological: Negative for weakness and headaches.      Allergies  Fish allergy and Sulfa antibiotics  Home Medications   Prior  to Admission medications   Medication Sig Start Date End Date Taking? Authorizing Provider  aspirin 81 MG tablet Take 81 mg by mouth daily.   Yes Historical Provider, MD  Calcium Carbonate (CALTRATE 600 PO) Take 1,200 mg by mouth 2 (two) times daily.    Yes Historical Provider, MD  CRANBERRY PO Take 1 tablet by mouth 3 (three) times daily.   Yes Historical Provider, MD  furosemide (LASIX) 20 MG tablet Take 20 mg by mouth daily.   Yes Historical Provider, MD  Astrid Drafts Omega-3 300 MG CAPS Take 1 capsule by mouth daily.   Yes Historical Provider, MD  levothyroxine (SYNTHROID, LEVOTHROID) 75 MCG tablet Take 75 mcg by mouth  daily before breakfast.   Yes Historical Provider, MD  loratadine (CLARITIN) 10 MG tablet Take 10 mg by mouth daily.   Yes Historical Provider, MD  Multiple Vitamin (MULTIVITAMIN) capsule Take 1 capsule by mouth daily.   Yes Historical Provider, MD  Multiple Vitamins-Minerals (ICAPS MV PO) Take 1 capsule by mouth daily.    Yes Historical Provider, MD  omeprazole (PRILOSEC) 20 MG capsule Take 20 mg by mouth daily.   Yes Historical Provider, MD  oxybutynin (DITROPAN) 5 MG tablet Take 5 mg by mouth daily.   Yes Historical Provider, MD  simvastatin (ZOCOR) 40 MG tablet Take 20 mg by mouth every evening.    Yes Historical Provider, MD  valsartan (DIOVAN) 320 MG tablet Take 320 mg by mouth daily.   Yes Historical Provider, MD  cephALEXin (KEFLEX) 250 MG capsule Take 1 capsule (250 mg total) by mouth 4 (four) times daily. 11/23/13   Gaelen Brager, PA-C   BP 165/63  Pulse 58  Temp(Src) 97.4 F (36.3 C) (Oral)  Resp 22  SpO2 97% Physical Exam  Nursing note and vitals reviewed. Constitutional: She is oriented to person, place, and time. She appears well-developed and well-nourished. No distress.  HENT:  Head: Normocephalic and atraumatic.  Mouth/Throat: Oropharynx is clear and moist. No oropharyngeal exudate.  Eyes: Conjunctivae and EOM are normal. Pupils are equal, round, and reactive to light. Right eye exhibits no discharge. Left eye exhibits no discharge.  Neck: Normal range of motion. Neck supple. No tracheal deviation present.  Cardiovascular: Normal rate, regular rhythm and normal heart sounds.  Exam reveals no friction rub.   No murmur heard. Pulmonary/Chest: Effort normal and breath sounds normal. No respiratory distress. She has no wheezes. She has no rales.  Abdominal: Soft. Normal appearance and bowel sounds are normal. She exhibits no distension. There is tenderness in the periumbilical area. There is no rebound and no guarding.    Negative abdominal distention Bowel sounds normal  active in all 4 quadrants Abdomen soft upon palpation Discomfort upon palpation to the Center the abdomen-umbilical region Negative peritoneal signs Negative guarding or rigidity noted  Musculoskeletal: Normal range of motion.  Full ROM to upper and lower extremities without difficulty noted, negative ataxia noted.  Lymphadenopathy:    She has no cervical adenopathy.  Neurological: She is alert and oriented to person, place, and time. No cranial nerve deficit. She exhibits normal muscle tone. Coordination normal.  Skin: Skin is warm and dry. No rash noted. She is not diaphoretic. No erythema.  Psychiatric: She has a normal mood and affect. Her behavior is normal. Thought content normal.    ED Course  Procedures (including critical care time)  1:22 AM Dr. Tawnya Crook received phone call regarding CT scan of patient.   1:26 AM Dr. Tawnya Crook saw and  assessed patient. Cleared patient for discharge.   Results for orders placed during the hospital encounter of 11/22/13  CBC WITH DIFFERENTIAL      Result Value Ref Range   WBC 10.2  4.0 - 10.5 K/uL   RBC 4.16  3.87 - 5.11 MIL/uL   Hemoglobin 13.0  12.0 - 15.0 g/dL   HCT 39.6  36.0 - 46.0 %   MCV 95.2  78.0 - 100.0 fL   MCH 31.3  26.0 - 34.0 pg   MCHC 32.8  30.0 - 36.0 g/dL   RDW 13.7  11.5 - 15.5 %   Platelets 221  150 - 400 K/uL   Neutrophils Relative % 86 (*) 43 - 77 %   Neutro Abs 8.7 (*) 1.7 - 7.7 K/uL   Lymphocytes Relative 8 (*) 12 - 46 %   Lymphs Abs 0.9  0.7 - 4.0 K/uL   Monocytes Relative 5  3 - 12 %   Monocytes Absolute 0.5  0.1 - 1.0 K/uL   Eosinophils Relative 1  0 - 5 %   Eosinophils Absolute 0.1  0.0 - 0.7 K/uL   Basophils Relative 0  0 - 1 %   Basophils Absolute 0.0  0.0 - 0.1 K/uL  LIPASE, BLOOD      Result Value Ref Range   Lipase 55  11 - 59 U/L  COMPREHENSIVE METABOLIC PANEL      Result Value Ref Range   Sodium 139  137 - 147 mEq/L   Potassium 4.7  3.7 - 5.3 mEq/L   Chloride 98  96 - 112 mEq/L   CO2 24  19 -  32 mEq/L   Glucose, Bld 126 (*) 70 - 99 mg/dL   BUN 35 (*) 6 - 23 mg/dL   Creatinine, Ser 1.49 (*) 0.50 - 1.10 mg/dL   Calcium 11.8 (*) 8.4 - 10.5 mg/dL   Total Protein 8.1  6.0 - 8.3 g/dL   Albumin 3.8  3.5 - 5.2 g/dL   AST 21  0 - 37 U/L   ALT 13  0 - 35 U/L   Alkaline Phosphatase 66  39 - 117 U/L   Total Bilirubin 0.3  0.3 - 1.2 mg/dL   GFR calc non Af Amer 30 (*) >90 mL/min   GFR calc Af Amer 35 (*) >90 mL/min   Anion gap 17 (*) 5 - 15  URINALYSIS, ROUTINE W REFLEX MICROSCOPIC      Result Value Ref Range   Color, Urine YELLOW  YELLOW   APPearance CLOUDY (*) CLEAR   Specific Gravity, Urine 1.019  1.005 - 1.030   pH 5.0  5.0 - 8.0   Glucose, UA NEGATIVE  NEGATIVE mg/dL   Hgb urine dipstick LARGE (*) NEGATIVE   Bilirubin Urine NEGATIVE  NEGATIVE   Ketones, ur NEGATIVE  NEGATIVE mg/dL   Protein, ur NEGATIVE  NEGATIVE mg/dL   Urobilinogen, UA 0.2  0.0 - 1.0 mg/dL   Nitrite NEGATIVE  NEGATIVE   Leukocytes, UA MODERATE (*) NEGATIVE  TROPONIN I      Result Value Ref Range   Troponin I <0.30  <0.30 ng/mL  URINE MICROSCOPIC-ADD ON      Result Value Ref Range   WBC, UA 7-10  <3 WBC/hpf   RBC / HPF 7-10  <3 RBC/hpf   Bacteria, UA RARE  RARE   Casts HYALINE CASTS (*) NEGATIVE   Urine-Other MUCOUS PRESENT      Labs Review Labs Reviewed  CBC WITH DIFFERENTIAL -  Abnormal; Notable for the following:    Neutrophils Relative % 86 (*)    Neutro Abs 8.7 (*)    Lymphocytes Relative 8 (*)    All other components within normal limits  COMPREHENSIVE METABOLIC PANEL - Abnormal; Notable for the following:    Glucose, Bld 126 (*)    BUN 35 (*)    Creatinine, Ser 1.49 (*)    Calcium 11.8 (*)    GFR calc non Af Amer 30 (*)    GFR calc Af Amer 35 (*)    Anion gap 17 (*)    All other components within normal limits  URINALYSIS, ROUTINE W REFLEX MICROSCOPIC - Abnormal; Notable for the following:    APPearance CLOUDY (*)    Hgb urine dipstick LARGE (*)    Leukocytes, UA MODERATE (*)     All other components within normal limits  URINE MICROSCOPIC-ADD ON - Abnormal; Notable for the following:    Casts HYALINE CASTS (*)    All other components within normal limits  URINE CULTURE  LIPASE, BLOOD  TROPONIN I    Imaging Review Ct Abdomen Pelvis Wo Contrast  11/23/2013   CLINICAL DATA:  Mid abdominal pain and cramping.  EXAM: CT ABDOMEN AND PELVIS WITHOUT CONTRAST  TECHNIQUE: Multidetector CT imaging of the abdomen and pelvis was performed following the standard protocol without IV contrast.  COMPARISON:  CT of the abdomen performed 08/16/2013, and CT of the abdomen pelvis from 08/04/2011  FINDINGS: There is eventration of much of the stomach above the left hemidiaphragm, with associated left basilar airspace opacification. Though this likely reflects atelectasis, pneumonia cannot be excluded. A trace left pleural effusion is noted. Minimal right basilar bronchiectasis and atelectasis are seen.  A 1.0 cm nodule near the right hilum is grossly stable from 2013 and likely benign.  Several hypodensities within the right hepatic lobe are relatively stable from 2013 and likely reflect cysts. The spleen is unremarkable in appearance. The gallbladder is within normal limits. The pancreas and adrenal glands are unremarkable.  A 2.4 cm cyst arising at the anterior aspect of the left kidney is only mildly changed from 2013 and likely benign. Mild right-sided pelvicaliectasis remains within normal limits, without evidence of distal obstruction. There is no evidence of hydronephrosis. No renal or ureteral stones are seen. No perinephric stranding is appreciated.  No free fluid is identified. The small bowel is unremarkable in appearance. The stomach is within normal limits. No acute vascular abnormalities are seen. Diffuse calcification is seen along the abdominal aorta. No significant calcification is noted along the superior mesentery artery to raise concern for bowel ischemia.  Mild nonspecific haziness  in the mesentery appears grossly stable from prior studies and may reflect the patient's baseline.  The appendix is not definitely seen; there is no evidence for appendicitis. Contrast progresses to the level of the cecum. There is no evidence for bowel obstruction. There is redundancy of the transverse colon. Relatively diffuse diverticulosis is noted along the sigmoid colon, and scattered diverticulosis is noted along the ascending and descending colon, without definite evidence of diverticulitis.  The bladder is mildly distended and grossly unremarkable. The patient is status post hysterectomy. No suspicious adnexal masses are seen. No inguinal lymphadenopathy is seen.  A tiny left inguinal hernia is seen, containing only fat.  No acute osseous abnormalities are identified. There is grade 2 anterolisthesis of L4 on L5. This reflects underlying facet disease. Multilevel vacuum phenomenon is noted along the lumbar spine.  IMPRESSION: 1.  No evidence of bowel obstruction. Contrast progresses to the level of the cecum. 2. Relatively diffuse diverticulosis along the sigmoid colon, and scattered diverticulosis along the descending and descending colon, without definite evidence of diverticulitis. 3. Mild nonspecific haziness in the mesentery appears grossly stable from prior studies and may reflect the patient's baseline. 4. Eventration of much of the stomach above the left hemidiaphragm, with associated left basilar airspace opacification. Though this likely reflects atelectasis, mild pneumonia cannot be excluded. Trace left pleural effusion seen. 5. Minimal right basilar bronchiectasis and atelectasis seen. Stable appearance to 1.0 cm nodule near the right hilum since 2013, likely benign. 6. Diffuse calcification along the abdominal aorta. 7. Likely hepatic cysts and left renal cyst noted. 8. Tiny left inguinal hernia, containing only fat. 9. Mild degenerative change noted along the lumbar spine, with grade 2  anterolisthesis of L4 on L5.   Electronically Signed   By: Garald Balding M.D.   On: 11/23/2013 01:34     EKG Interpretation   Date/Time:  Thursday November 22 2013 20:28:35 EDT Ventricular Rate:  59 PR Interval:  204 QRS Duration: 142 QT Interval:  477 QTC Calculation: 473 R Axis:   -44 Text Interpretation:  Sinus rhythm Left bundle branch block No significant  change since last tracing Confirmed by DOCHERTY  MD, MEGAN ([id number]) on  11/22/2013 8:34:23 PM      MDM   Final diagnoses:  Periumbilical abdominal pain  UTI (lower urinary tract infection)    Medications  sodium chloride 0.9 % bolus 250 mL (not administered)  iohexol (OMNIPAQUE) 300 MG/ML solution 25 mL (25 mLs Oral Contrast Given 11/22/13 2252)   Filed Vitals:   11/22/13 2230 11/22/13 2300 11/23/13 0045 11/23/13 0100  BP: 175/62 189/74 163/60 165/63  Pulse: 58 56 57 58  Temp:      TempSrc:      Resp: 26 26 15 22   SpO2: 96% 100% 97% 97%   EKG noted sinus rhythm with a heart rate of 59 beats per minute-left bundle branch block noted. Troponin negative elevation. CBC negative elevated white blood cell count. CMP noted elevated creatinine of 1.49. Glucose mildly elevated at 126 with gap of 17.0 mmol per liter. Lipase negative elevation. Urinalysis noted large hemoglobin with moderate leukocytes and white blood cell count of 7-10. Urine culture pending. CT abdomen and pelvis negative for bowel obstruction. Diffuse diverticulosis along the sigmoid colon without evidence of diverticulitis. Mild nonspecific haziness in the mesentery appears grossly stable when compared to prior studies. Minimal right basilar bronchiectasis. Diffuse calcification along the abdominal aorta. CT of abdomen and pelvis unremarkable - negative acute findings. Patient has incidental UTI - will treat. Patient tolerated PO without difficulty - negative episodes of emesis while in the ED setting. Patient seen and assessed by attending physician. Reviewed labs  and imaging with Dr. Margaretha Seeds who agreed to plan of discharge. Patient stable, afebrile. Patient not septic appearing. Discharged patient. Discussed with patient to rest and stay hydrated. Referred to PCP and GI. Discussed with patient to closely monitor symptoms and if symptoms are to worsen or change to report back to the ED - strict return instructions given.  Patient agreed to plan of care, understood, all questions answered.   Jamse Mead, PA-C 11/23/13 [id number]

## 2013-11-22 NOTE — ED Notes (Signed)
Patient transported to CT 

## 2013-11-22 NOTE — ED Notes (Signed)
Per EMS: Pt from [facility], states she ate at [employer] at 1230 this afternoon and ever since has had generalized abdominal pain. Distention noted. Pt denies N/V/D.Pt AO x4. 140/90. 68 NSR. 16 RR.

## 2013-11-22 NOTE — ED Notes (Signed)
Returned from CT.

## 2013-11-23 MED ORDER — CEPHALEXIN 250 MG PO CAPS: 250.0000 mg | ORAL_CAPSULE | Freq: Four times a day (QID) | ORAL | Status: DC

## 2013-11-23 NOTE — ED Notes (Signed)
Ct in process of being read

## 2013-11-23 NOTE — Discharge Instructions (Signed)
Please call your doctor for a followup appointment within 24-48 hours. When you talk to your doctor please let them know that you were seen in the emergency department and have them acquire all of your records so that they can discuss the findings with you and formulate a treatment plan to fully care for your new and ongoing problems. Please call and set-up an appointment with your primary care provider to be seen and re-assessed  Please call and set-up an appointment with GI to be seen  Please take antibiotics as prescribed Please rest and stay hydrated  Please avoid any fatty and greasy foods Please continue to monitor symptoms closely and if symptoms are to worsen or change (fever greater than 101, chills, sweating, nausea, vomiting, chest pain, shortness of breath, difficulty breathing, numbness, tingling, worsening or changes to pain patter, blood in the stools, black tarry stools diarrhea, weakness, inability to keep any food or fluid down) please report back to the ED immediately    Abdominal Pain, Women Abdominal (stomach, pelvic, or belly) pain can be caused by many things. It is important to tell your doctor:  The location of the pain.  Does it come and go or is it present all the time?  Are there things that start the pain (eating certain foods, exercise)?  Are there other symptoms associated with the pain (fever, nausea, vomiting, diarrhea)? All of this is helpful to know when trying to find the cause of the pain. CAUSES   Stomach: virus or bacteria infection, or ulcer.  Intestine: appendicitis (inflamed appendix), regional ileitis (Crohn's disease), ulcerative colitis (inflamed colon), irritable bowel syndrome, diverticulitis (inflamed diverticulum of the colon), or cancer of the stomach or intestine.  Gallbladder disease or stones in the gallbladder.  Kidney disease, kidney stones, or infection.  Pancreas infection or cancer.  Fibromyalgia (pain disorder).  Diseases of  the female organs:  Uterus: fibroid (non-cancerous) tumors or infection.  Fallopian tubes: infection or tubal pregnancy.  Ovary: cysts or tumors.  Pelvic adhesions (scar tissue).  Endometriosis (uterus lining tissue growing in the pelvis and on the pelvic organs).  Pelvic congestion syndrome (female organs filling up with blood just before the menstrual period).  Pain with the menstrual period.  Pain with ovulation (producing an egg).  Pain with an IUD (intrauterine device, birth control) in the uterus.  Cancer of the female organs.  Functional pain (pain not caused by a disease, may improve without treatment).  Psychological pain.  Depression. DIAGNOSIS  Your doctor will decide the seriousness of your pain by doing an examination.  Blood tests.  X-rays.  Ultrasound.  CT scan (computed tomography, special type of X-ray).  MRI (magnetic resonance imaging).  Cultures, for infection.  Barium enema (dye inserted in the large intestine, to better view it with X-rays).  Colonoscopy (looking in intestine with a lighted tube).  Laparoscopy (minor surgery, looking in abdomen with a lighted tube).  Major abdominal exploratory surgery (looking in abdomen with a large incision). TREATMENT  The treatment will depend on the cause of the pain.   Many cases can be observed and treated at home.  Over-the-counter medicines recommended by your caregiver.  Prescription medicine.  Antibiotics, for infection.  Birth control pills, for painful periods or for ovulation pain.  Hormone treatment, for endometriosis.  Nerve blocking injections.  Physical therapy.  Antidepressants.  Counseling with a psychologist or psychiatrist.  Minor or major surgery. HOME CARE INSTRUCTIONS   Do not take laxatives, unless directed by your caregiver.  Take over-the-counter pain medicine only if ordered by your caregiver. Do not take aspirin because it can cause an upset stomach or  bleeding.  Try a clear liquid diet (broth or water) as ordered by your caregiver. Slowly move to a bland diet, as tolerated, if the pain is related to the stomach or intestine.  Have a thermometer and take your temperature several times a day, and record it.  Bed rest and sleep, if it helps the pain.  Avoid sexual intercourse, if it causes pain.  Avoid stressful situations.  Keep your follow-up appointments and tests, as your caregiver orders.  If the pain does not go away with medicine or surgery, you may try:  Acupuncture.  Relaxation exercises (yoga, meditation).  Group therapy.  Counseling. SEEK MEDICAL CARE IF:   You notice certain foods cause stomach pain.  Your home care treatment is not helping your pain.  You need stronger pain medicine.  You want your IUD removed.  You feel faint or lightheaded.  You develop nausea and vomiting.  You develop a rash.  You are having side effects or an allergy to your medicine. SEEK IMMEDIATE MEDICAL CARE IF:   Your pain does not go away or gets worse.  You have a fever.  Your pain is felt only in portions of the abdomen. The right side could possibly be appendicitis. The left lower portion of the abdomen could be colitis or diverticulitis.  You are passing blood in your stools (bright red or black tarry stools, with or without vomiting).  You have blood in your urine.  You develop chills, with or without a fever.  You pass out. MAKE SURE YOU:   Understand these instructions.  Will watch your condition.  Will get help right away if you are not doing well or get worse. Document Released: 01/24/2007 Document Revised: 08/13/2013 Document Reviewed: 02/13/2009 [facility] Patient Information 2015 [place], [place]. This information is not intended to replace advice given to you by your health care provider. Make sure you discuss any questions you have with your health care provider. Urinary Tract Infection Urinary tract  infections (UTIs) can develop anywhere along your urinary tract. Your urinary tract is your body's drainage system for removing wastes and extra water. Your urinary tract includes two kidneys, two ureters, a bladder, and a urethra. Your kidneys are a pair of bean-shaped organs. Each kidney is about the size of your fist. They are located below your ribs, one on each side of your spine. CAUSES Infections are caused by microbes, which are microscopic organisms, including fungi, viruses, and bacteria. These organisms are so small that they can only be seen through a microscope. Bacteria are the microbes that most commonly cause UTIs. SYMPTOMS  Symptoms of UTIs may vary by age and gender of the patient and by the location of the infection. Symptoms in young women typically include a frequent and intense urge to urinate and a painful, burning feeling in the bladder or urethra during urination. Older women and men are more likely to be tired, shaky, and weak and have muscle aches and abdominal pain. A fever may mean the infection is in your kidneys. Other symptoms of a kidney infection include pain in your back or sides below the ribs, nausea, and vomiting. DIAGNOSIS To diagnose a UTI, your caregiver will ask you about your symptoms. Your caregiver also will ask to provide a urine sample. The urine sample will be tested for bacteria and white blood cells. White blood cells are  made by your body to help fight infection. TREATMENT  Typically, UTIs can be treated with medication. Because most UTIs are caused by a bacterial infection, they usually can be treated with the use of antibiotics. The choice of antibiotic and length of treatment depend on your symptoms and the type of bacteria causing your infection. HOME CARE INSTRUCTIONS  If you were prescribed antibiotics, take them exactly as your caregiver instructs you. Finish the medication even if you feel better after you have only taken some of the  medication.  Drink enough water and fluids to keep your urine clear or pale yellow.  Avoid caffeine, tea, and carbonated beverages. They tend to irritate your bladder.  Empty your bladder often. Avoid holding urine for long periods of time.  Empty your bladder before and after sexual intercourse.  After a bowel movement, women should cleanse from front to back. Use each tissue only once. SEEK MEDICAL CARE IF:   You have back pain.  You develop a fever.  Your symptoms do not begin to resolve within 3 days. SEEK IMMEDIATE MEDICAL CARE IF:   You have severe back pain or lower abdominal pain.  You develop chills.  You have nausea or vomiting.  You have continued burning or discomfort with urination. MAKE SURE YOU:   Understand these instructions.  Will watch your condition.  Will get help right away if you are not doing well or get worse. Document Released: 01/06/2005 Document Revised: 09/28/2011 Document Reviewed: 05/07/2011 [facility] Patient Information 2015 [place], [place]. This information is not intended to replace advice given to you by your health care provider. Make sure you discuss any questions you have with your health care provider.

## 2013-11-23 NOTE — ED Provider Notes (Signed)
Medical screening examination/treatment/procedure(s) were conducted as a shared visit with non-physician practitioner(s) and myself.  I personally evaluated the patient during the encounter. Pt presents with abdominal pain after eating [employer].  Pt feeling much better w/o imtervetion by time of by exam. She denies pain & abdomen benign.   EKG Interpretation   Date/Time:  Thursday November 22 2013 20:28:35 EDT Ventricular Rate:  59 PR Interval:  204 QRS Duration: 142 QT Interval:  477 QTC Calculation: 473 R Axis:   -44 Text Interpretation:  Sinus rhythm Left bundle branch block No significant  change since last tracing Confirmed by McClure  MD, [place] ([id number]) on  11/22/2013 8:34:23 PM        Ernestina Patches, MD 11/23/13 2036

## 2013-11-24 LAB — URINE CULTURE
Colony Count: NO GROWTH
Culture: NO GROWTH

## 2014-08-16 ENCOUNTER — Telehealth: Payer: Self-pay | Admitting: Cardiovascular Disease

## 2014-08-16 NOTE — Telephone Encounter (Signed)
Received records from [facility] for appointment on 10/25/14 with Dr Gwenlyn Found.  Records given to [facility] (medical records) for Dr Kennon Holter schedule on 10/25/14. lp

## 2014-08-21 ENCOUNTER — Telehealth: Payer: Self-pay | Admitting: Hematology and Oncology

## 2014-08-21 NOTE — Telephone Encounter (Signed)
Mailed a new calendar and letter to patient with dr Alvy Bimler appointment

## 2014-09-23 ENCOUNTER — Telehealth: Payer: Self-pay | Admitting: Hematology and Oncology

## 2014-09-23 NOTE — Telephone Encounter (Signed)
pt called to r/s appt..done...pt aware of new d.t °

## 2014-10-24 ENCOUNTER — Ambulatory Visit: Payer: Medicare Other

## 2014-10-24 ENCOUNTER — Other Ambulatory Visit: Payer: Medicare Other

## 2014-10-24 ENCOUNTER — Ambulatory Visit: Payer: Medicare Other | Admitting: Hematology and Oncology

## 2014-10-25 ENCOUNTER — Ambulatory Visit (INDEPENDENT_AMBULATORY_CARE_PROVIDER_SITE_OTHER): Payer: Medicare Other | Admitting: Cardiovascular Disease

## 2014-10-25 ENCOUNTER — Encounter: Payer: Self-pay | Admitting: Cardiovascular Disease

## 2014-10-25 VITALS — BP 102/60 | HR 56 | Ht 60.0 in | Wt 109.0 lb

## 2014-10-25 DIAGNOSIS — E785 Hyperlipidemia, unspecified: Secondary | ICD-10-CM

## 2014-10-25 DIAGNOSIS — I447 Left bundle-branch block, unspecified: Secondary | ICD-10-CM

## 2014-10-25 DIAGNOSIS — I10 Essential (primary) hypertension: Secondary | ICD-10-CM

## 2014-10-25 NOTE — Assessment & Plan Note (Signed)
chronic

## 2014-10-25 NOTE — Progress Notes (Signed)
10/25/2014 Cindy [name]   1922/12/08  ID:[id number]  Primary Physician [name], MD Primary Cardiologist: Lorretta Harp MD Renae Gloss   HPI:  The patient returns today for annual followup. She is a delightful 66and-year-old thin and frail appearing married Caucasian female whose husband [name] is also a patient of mine. I last saw her in the office 10/19/13... She has a history of normal coronary arteries by cath in 2005 and again in 2007. She has chronic left bundle branch block, hypertension and hyperlipidemia. She also has GERD, followed by Dr. Carol Ada with an endoscopy in the past. [name] followed her lipid profile. Since I saw her back in the office 10/19/13, she's remained clinically stable specifically denying chest pain or shortness of breath.   Current Outpatient Prescriptions  Medication Sig Dispense Refill  . aspirin 81 MG tablet Take 81 mg by mouth daily.    . Calcium Carbonate (CALTRATE 600 PO) Take 1,200 mg by mouth 2 (two) times daily.     Marland Kitchen CRANBERRY PO Take 1 tablet by mouth 3 (three) times daily.    . furosemide (LASIX) 20 MG tablet Take 20 mg by mouth daily.    Astrid Drafts Omega-3 300 MG CAPS Take 1 capsule by mouth daily.    Marland Kitchen levothyroxine (SYNTHROID, LEVOTHROID) 75 MCG tablet Take 75 mcg by mouth daily before breakfast.    . loratadine (CLARITIN) 10 MG tablet Take 10 mg by mouth daily.    . Multiple Vitamin (MULTIVITAMIN) capsule Take 1 capsule by mouth daily.    . Multiple Vitamins-Minerals (ICAPS MV PO) Take 1 capsule by mouth daily.     Marland Kitchen omeprazole (PRILOSEC) 20 MG capsule Take 20 mg by mouth daily.    Marland Kitchen oxybutynin (DITROPAN) 5 MG tablet Take 5 mg by mouth daily.    . simvastatin (ZOCOR) 40 MG tablet Take 20 mg by mouth every evening.     . valsartan (DIOVAN) 320 MG tablet Take 320 mg by mouth daily.     No current facility-administered medications for this visit.    Allergies  Allergen Reactions  . Fish Allergy Hives    . Sulfa Antibiotics Other (See Comments)    Reaction unknown    History   Social History  . Marital Status: Married    Spouse Name: N/A  . Number of Children: N/A  . Years of Education: N/A   Occupational History  . Not on file.   Social History Main Topics  . Smoking status: Never Smoker   . Smokeless tobacco: Not on file  . Alcohol Use: No  . Drug Use: Not on file  . Sexual Activity: Not on file   Other Topics Concern  . Not on file   Social History Narrative     Review of Systems: General: negative for chills, fever, night sweats or weight changes.  Cardiovascular: negative for chest pain, dyspnea on exertion, edema, orthopnea, palpitations, paroxysmal nocturnal dyspnea or shortness of breath Dermatological: negative for rash Respiratory: negative for cough or wheezing Urologic: negative for hematuria Abdominal: negative for nausea, vomiting, diarrhea, bright red blood per rectum, melena, or hematemesis Neurologic: negative for visual changes, syncope, or dizziness All other systems reviewed and are otherwise negative except as noted above.    Blood pressure 102/60, pulse 56, height 5' (1.524 m), weight 109 lb (49.442 kg).  General appearance: alert and icteric Neck: no adenopathy, no JVD, supple, symmetrical, trachea midline, thyroid not enlarged, symmetric, no tenderness/mass/nodules and soft  bilateral carotid bruits left greater than right Lungs: clear to auscultation bilaterally Heart: regular rate and rhythm, S1, S2 normal, no murmur, click, rub or gallop Extremities: extremities normal, atraumatic, no cyanosis or edema  EKG sinus bradycardia 56 with left bundle branch block. I personally reviewed his EKG  ASSESSMENT AND PLAN:   Left bundle branch block chronic  Hyperlipidemia History of hyperlipidemia on simvastatin 40 mg a day followed by her PCP  Essential hypertension History of hypertension with blood pressure agitated 102/60. She is on Diovan.  Continue current meds at current dosing      Lorretta Harp MD [facility] 10/25/2014 2:41 PM

## 2014-10-25 NOTE — Assessment & Plan Note (Signed)
History of hypertension with blood pressure agitated 102/60. She is on Diovan. Continue current meds at current dosing

## 2014-10-25 NOTE — Patient Instructions (Signed)
Dr Berry recommends that you schedule a follow-up appointment in 1 year. You will receive a reminder letter in the mail two months in advance. If you don't receive a letter, please call our office to schedule the follow-up appointment. 

## 2014-10-25 NOTE — Assessment & Plan Note (Signed)
History of hyperlipidemia on simvastatin 40 mg a day followed by her PCP 

## 2014-11-04 ENCOUNTER — Other Ambulatory Visit: Payer: Self-pay | Admitting: Hematology and Oncology

## 2014-11-04 ENCOUNTER — Encounter: Payer: Self-pay | Admitting: Hematology and Oncology

## 2014-11-04 ENCOUNTER — Ambulatory Visit (HOSPITAL_BASED_OUTPATIENT_CLINIC_OR_DEPARTMENT_OTHER): Payer: Medicare Other | Admitting: Hematology and Oncology

## 2014-11-04 ENCOUNTER — Other Ambulatory Visit (HOSPITAL_BASED_OUTPATIENT_CLINIC_OR_DEPARTMENT_OTHER): Payer: Medicare Other

## 2014-11-04 VITALS — BP 117/52 | HR 57 | Temp 98.1°F | Resp 18 | Ht 60.0 in | Wt 108.6 lb

## 2014-11-04 DIAGNOSIS — C4362 Malignant melanoma of left upper limb, including shoulder: Secondary | ICD-10-CM

## 2014-11-04 DIAGNOSIS — Z85820 Personal history of malignant melanoma of skin: Secondary | ICD-10-CM

## 2014-11-04 LAB — CBC WITH DIFFERENTIAL/PLATELET
BASO%: 0.8 % (ref 0.0–2.0)
Basophils Absolute: 0.1 10*3/uL (ref 0.0–0.1)
EOS%: 4.9 % (ref 0.0–7.0)
Eosinophils Absolute: 0.4 10*3/uL (ref 0.0–0.5)
HCT: 36.2 % (ref 34.8–46.6)
HGB: 11.9 g/dL (ref 11.6–15.9)
LYMPH%: 19.5 % (ref 14.0–49.7)
MCH: 31.3 pg (ref 25.1–34.0)
MCHC: 32.9 g/dL (ref 31.5–36.0)
MCV: 95.1 fL (ref 79.5–101.0)
MONO#: 0.8 10*3/uL (ref 0.1–0.9)
MONO%: 9.5 % (ref 0.0–14.0)
NEUT#: 5.2 10*3/uL (ref 1.5–6.5)
NEUT%: 65.3 % (ref 38.4–76.8)
Platelets: 260 10*3/uL (ref 145–400)
RBC: 3.8 10*6/uL (ref 3.70–5.45)
RDW: 14.8 % — ABNORMAL HIGH (ref 11.2–14.5)
WBC: 8 10*3/uL (ref 3.9–10.3)
lymph#: 1.6 10*3/uL (ref 0.9–3.3)

## 2014-11-04 LAB — COMPREHENSIVE METABOLIC PANEL (CC13)
ALT: 16 U/L (ref 0–55)
AST: 22 U/L (ref 5–34)
Albumin: 3.5 g/dL (ref 3.5–5.0)
Alkaline Phosphatase: 45 U/L (ref 40–150)
Anion Gap: 10 mEq/L (ref 3–11)
BUN: 32.6 mg/dL — ABNORMAL HIGH (ref 7.0–26.0)
CO2: 25 mEq/L (ref 22–29)
Calcium: 10 mg/dL (ref 8.4–10.4)
Chloride: 109 mEq/L (ref 98–109)
Creatinine: 1.4 mg/dL — ABNORMAL HIGH (ref 0.6–1.1)
EGFR: 34 mL/min/{1.73_m2} — ABNORMAL LOW (ref >90)
Glucose: 99 mg/dl (ref 70–140)
Potassium: 4.3 mEq/L (ref 3.5–5.1)
Sodium: 143 mEq/L (ref 136–145)
Total Bilirubin: 0.36 mg/dL (ref 0.20–1.20)
Total Protein: 6.9 g/dL (ref 6.4–8.3)

## 2014-11-04 NOTE — Progress Notes (Signed)
[place] FOLLOW-UP progress notes  Patient Care Team: Lorne Skeens, MD as PCP - General (Endocrinology)  CHIEF COMPLAINTS/PURPOSE OF VISIT:  History of melanoma of the left upper arm status post surgery  HISTORY OF PRESENTING ILLNESS:  Cindy [name] 81 y.o. female was transferred to my care after her prior physician has left.  I reviewed the patient's records extensive and collaborated the history with the patient. Summary of her history is as follows: This is a patient that was seen by another physician. She have excessive sun exposure growing up and had used suntanning bed in the past. She was found to have melanoma of the skin on the left upper arm status post surgical resection and sentinel lymph node biopsy. She does not require adjuvant treatment. She has not seen her dermatologist for some time. She denies any new skin lesions.  MEDICAL HISTORY:  Past Medical History  Diagnosis Date  . GERD (gastroesophageal reflux disease)   . Hypertension   . Osteoporosis   . Malignant melanoma 11/12/04    L upper arm  . Hiatal hernia   . Murmur, cardiac   . Dyslipidemia   . Hypothyroidism   . Macular degeneration   . Lung nodule 07/01/03    right middle lobe  . LBBB (left bundle branch block)   . Chronic renal insufficiency, stage III (moderate)   . History of nuclear stress test 2005    persantine; inferoapical ischmeia with EF 83%    SURGICAL HISTORY: Past Surgical History  Procedure Laterality Date  . Total abdominal hysterectomy w/ bilateral salpingoophorectomy    . Bladder repair    . Rotator cuff repair      right  . Cataract extraction      bilat  . Knee      right cartilage repair  . Coccyx  1951    excision  . Cardiac catheterization  2005/2007    normal coronaries   . Transthoracic echocardiogram  2007    EF=>55%; borderline conc LVH; LA mod dilated; RA mild-mod dilated; mild TR; mild AVR with mildly sclerotic AV leavlefts; mild PV regurg      SOCIAL HISTORY: History   Social History  . Marital Status: Married    Spouse Name: N/A  . Number of Children: N/A  . Years of Education: N/A   Occupational History  . Not on file.   Social History Main Topics  . Smoking status: Never Smoker   . Smokeless tobacco: Never Used  . Alcohol Use: No  . Drug Use: No  . Sexual Activity: Not on file   Other Topics Concern  . Not on file   Social History Narrative    FAMILY HISTORY: Family History  Problem Relation Age of Onset  . Cancer Daughter     breast  . Cancer Father     NHL  . Cancer Maternal Aunt     breast  . Cancer Maternal Grandmother     rectal  . Cancer Son     melanoma    ALLERGIES:  is allergic to fish allergy and sulfa antibiotics.  MEDICATIONS:  Current Outpatient Prescriptions  Medication Sig Dispense Refill  . aspirin 81 MG tablet Take 81 mg by mouth daily.    . Calcium Carbonate (CALTRATE 600 PO) Take 1,200 mg by mouth 2 (two) times daily.     Marland Kitchen CRANBERRY PO Take 1 tablet by mouth 3 (three) times daily.    . furosemide (LASIX) 20 MG tablet Take 20  mg by mouth daily.    Astrid Drafts Omega-3 300 MG CAPS Take 1 capsule by mouth daily.    Marland Kitchen levothyroxine (SYNTHROID, LEVOTHROID) 75 MCG tablet Take 75 mcg by mouth daily before breakfast.    . loratadine (CLARITIN) 10 MG tablet Take 10 mg by mouth daily.    . Multiple Vitamin (MULTIVITAMIN) capsule Take 1 capsule by mouth daily.    . Multiple Vitamins-Minerals (ICAPS MV PO) Take 1 capsule by mouth daily.     Marland Kitchen omeprazole (PRILOSEC) 20 MG capsule Take 20 mg by mouth daily.    Marland Kitchen oxybutynin (DITROPAN) 5 MG tablet Take 5 mg by mouth daily.    . simvastatin (ZOCOR) 40 MG tablet Take 20 mg by mouth every evening.     . valsartan (DIOVAN) 320 MG tablet Take 320 mg by mouth daily.     No current facility-administered medications for this visit.    REVIEW OF SYSTEMS:   Constitutional: Denies fevers, chills or abnormal night sweats Eyes: Denies  blurriness of vision, double vision or watery eyes Ears, nose, mouth, throat, and face: Denies mucositis or sore throat Respiratory: Denies cough, dyspnea or wheezes Cardiovascular: Denies palpitation, chest discomfort or lower extremity swelling Gastrointestinal:  Denies nausea, heartburn or change in bowel habits Skin: Denies abnormal skin rashes Lymphatics: Denies new lymphadenopathy or easy bruising Neurological:Denies numbness, tingling or new weaknesses Behavioral/Psych: Mood is stable, no new changes  All other systems were reviewed with the patient and are negative.  PHYSICAL EXAMINATION: ECOG PERFORMANCE STATUS: 0 - Asymptomatic  Filed Vitals:   11/04/14 1443  BP: 117/52  Pulse: 57  Temp: 98.1 F (36.7 C)  Resp: 18   Filed Weights   11/04/14 1443  Weight: 108 lb 9.6 oz (49.261 kg)    GENERAL:alert, no distress and comfortable SKIN: careful examination of her skin reveals well-healed surgical scar.no other lesions were seen. EYES: normal, conjunctiva are pink and non-injected, sclera clear OROPHARYNX:no exudate, normal lips, buccal mucosa, and tongue  NECK: supple, thyroid normal size, non-tender, without nodularity LYMPH:  no palpable lymphadenopathy in the cervical, axillary or inguinal LUNGS: clear to auscultation and percussion with normal breathing effort HEART: regular rate & rhythm and no murmurs without lower extremity edema ABDOMEN:abdomen soft, non-tender and normal bowel sounds Musculoskeletal:no cyanosis of digits and no clubbing  PSYCH: alert & oriented x 3 with fluent speech NEURO: no focal motor/sensory deficits  LABORATORY DATA:  I have reviewed the data as listed Lab Results  Component Value Date   WBC 8.0 11/04/2014   HGB 11.9 11/04/2014   HCT 36.2 11/04/2014   MCV 95.1 11/04/2014   PLT 260 11/04/2014    Recent Labs  11/22/13 2023  NA 139  K 4.7  CL 98  CO2 24  GLUCOSE 126*  BUN 35*  CREATININE 1.49*  CALCIUM 11.8*  GFRNONAA 30*   GFRAA 35*  PROT 8.1  ALBUMIN 3.8  AST 21  ALT 13  ALKPHOS 66  BILITOT 0.3    ASSESSMENT & PLAN:  Melanoma of left upper arm Clinically, she had no signs of disease recurrence. I will discharge the patient from the clinic as she is now a 34 year cancer survivor. I recommend skin protection and yearly dermatology visit.   No orders of the defined types were placed in this encounter.    All questions were answered. The patient knows to call the clinic with any problems, questions or concerns. I spent 15 minutes counseling the patient face to face.  The total time spent in the appointment was 20 minutes and more than 50% was on counseling.     [facility], [place], MD 11/04/2014 3:03 PM

## 2014-11-04 NOTE — Assessment & Plan Note (Signed)
Clinically, she had no signs of disease recurrence. I will discharge the patient from the clinic as she is now a 34 year cancer survivor. I recommend skin protection and yearly dermatology visit.

## 2014-11-06 ENCOUNTER — Ambulatory Visit: Payer: Medicare Other

## 2015-01-01 ENCOUNTER — Ambulatory Visit (INDEPENDENT_AMBULATORY_CARE_PROVIDER_SITE_OTHER): Payer: Medicare Other | Admitting: Podiatry

## 2015-01-01 ENCOUNTER — Encounter: Payer: Self-pay | Admitting: Podiatry

## 2015-01-01 VITALS — BP 152/74 | HR 50 | Resp 16

## 2015-01-01 DIAGNOSIS — L84 Corns and callosities: Secondary | ICD-10-CM | POA: Diagnosis not present

## 2015-01-01 DIAGNOSIS — M2040 Other hammer toe(s) (acquired), unspecified foot: Secondary | ICD-10-CM | POA: Diagnosis not present

## 2015-01-01 NOTE — Progress Notes (Signed)
Subjective:     Patient ID: Cindy [name], female   DOB: Jan 06, 1923, 81 y.o.   MRN: [id number]  HPI patient presents with painful lesions between the big toe second toe right and the fourth and fifth toes left that makes shoe gear difficult. Does not remember specific injuries but no she has bad digital to structure and has had a history of bunions and hammertoe deformity   Review of Systems  All other systems reviewed and are negative.      Objective:   Physical Exam  Constitutional: She is oriented to person, place, and time.  Cardiovascular: Intact distal pulses.   Musculoskeletal: Normal range of motion.  Neurological: She is oriented to person, place, and time.  Skin: Skin is warm and dry.  Nursing note and vitals reviewed.  neurovascular status intact muscle strength adequate range of motion within normal limits. Patient's noted to have abnormal digital structure with the hallux and second toes pressing against each other on the right foot and the fourth and fifth toes on the left foot. There is interdigital callus formation with bone against bone pressure creating the pain she is experiencing     Assessment:     Hammertoe deformities with keratotic tissue formation bilateral and pain    Plan:     H&P and conditions reviewed with patient. I want to try to avoid surgery and today deep debridement was accomplished along with padding and I did discuss digital surgery if it does not improve

## 2015-01-01 NOTE — Progress Notes (Signed)
   Subjective:    Patient ID: Cindy [name], female    DOB: 13-Nov-1922, 81 y.o.   MRN: [id number]  HPI Pt presents with painful interdigital corns right 1st and 2nd, left 4th and 5th, she has tried otc treatment with no relief   Review of Systems  All other systems reviewed and are negative.      Objective:   Physical Exam        Assessment & Plan:

## 2015-03-04 ENCOUNTER — Other Ambulatory Visit: Payer: Self-pay | Admitting: Dermatology

## 2015-06-19 ENCOUNTER — Encounter: Payer: Self-pay | Admitting: *Deleted

## 2015-06-19 ENCOUNTER — Ambulatory Visit (INDEPENDENT_AMBULATORY_CARE_PROVIDER_SITE_OTHER): Payer: Medicare Other | Admitting: Podiatry

## 2015-06-19 ENCOUNTER — Encounter: Payer: Self-pay | Admitting: Podiatry

## 2015-06-19 VITALS — BP 143/78 | HR 50 | Resp 16

## 2015-06-19 DIAGNOSIS — M79675 Pain in left toe(s): Secondary | ICD-10-CM | POA: Diagnosis not present

## 2015-06-19 DIAGNOSIS — M79604 Pain in right leg: Secondary | ICD-10-CM

## 2015-06-19 DIAGNOSIS — L84 Corns and callosities: Secondary | ICD-10-CM

## 2015-06-19 DIAGNOSIS — M79674 Pain in right toe(s): Secondary | ICD-10-CM | POA: Diagnosis not present

## 2015-06-19 DIAGNOSIS — B351 Tinea unguium: Secondary | ICD-10-CM

## 2015-06-19 DIAGNOSIS — M79605 Pain in left leg: Secondary | ICD-10-CM

## 2015-06-19 NOTE — Progress Notes (Signed)
Subjective:     Patient ID: Cindy [name], female   DOB: 04/08/1923, 82 y.o.   MRN: [id number]  HPI patient presents with painful lesions on the right foot between the big toe and second toe that she has trouble wearing shoe gear with an E long gaited thick nails 1-5 both feet that can become tender due to the length and thickness   Review of Systems     Objective:   Physical Exam Neurovascular status unchanged with structural deformity between hallux second toe with keratotic lesion between the 2 toes that are painful and nail disease with mycotic component and yellow brittle type appearance 1 through 5 bilateral    Assessment:     Reviewed both conditions and discussed mycotic painful nails and lesion formation related to structure    Plan:     H&P conditions reviewed debrided lesions on the right and debrided nailbeds 1-5 both feet that are painful with no iatrogenic bleeding noted

## 2015-07-14 ENCOUNTER — Encounter: Payer: Self-pay | Admitting: Podiatry

## 2015-07-14 ENCOUNTER — Ambulatory Visit (INDEPENDENT_AMBULATORY_CARE_PROVIDER_SITE_OTHER): Payer: Medicare Other | Admitting: Podiatry

## 2015-07-14 DIAGNOSIS — M779 Enthesopathy, unspecified: Secondary | ICD-10-CM | POA: Diagnosis not present

## 2015-07-14 DIAGNOSIS — L84 Corns and callosities: Secondary | ICD-10-CM

## 2015-07-14 MED ORDER — TRIAMCINOLONE ACETONIDE 10 MG/ML IJ SUSP
10.0000 mg | Freq: Once | INTRAMUSCULAR | Status: AC
  Administered 2015-07-14: 10 mg

## 2015-07-15 NOTE — Progress Notes (Signed)
Subjective:     Patient ID: Cindy [name], female   DOB: [birth year], 82 y.o.   MRN: [id number]  HPI patient states I have severe pain on my left fourth toe plantar at the interphalangeal joint with lesion formation and it's hard to wear shoe gear   Review of Systems     Objective:   Physical Exam Neurovascular status intact with inflammatory capsulitis interphalangeal joint digit 4 left plantar with large keratotic lesion formation    Assessment:     Hammertoe deformity with inflammatory capsulitis and keratotic lesion formation    Plan:     Reviewed conditions did proximal nerve block of the area under sterile conditions today careful interphalangeal joint injection 2 mg dexamethasone 2 mg Xylocaine and did deep debridement of lesion and padded. Discussed possible hammertoe correction at one point in future

## 2015-10-31 ENCOUNTER — Ambulatory Visit: Payer: Medicare Other | Admitting: Cardiovascular Disease

## 2015-12-03 ENCOUNTER — Ambulatory Visit (INDEPENDENT_AMBULATORY_CARE_PROVIDER_SITE_OTHER): Payer: Medicare Other | Admitting: Cardiovascular Disease

## 2015-12-03 ENCOUNTER — Encounter: Payer: Self-pay | Admitting: Cardiovascular Disease

## 2015-12-03 VITALS — BP 112/50 | HR 60 | Ht 60.0 in | Wt 106.0 lb

## 2015-12-03 DIAGNOSIS — I447 Left bundle-branch block, unspecified: Secondary | ICD-10-CM

## 2015-12-03 DIAGNOSIS — I10 Essential (primary) hypertension: Secondary | ICD-10-CM

## 2015-12-03 DIAGNOSIS — E785 Hyperlipidemia, unspecified: Secondary | ICD-10-CM | POA: Diagnosis not present

## 2015-12-03 NOTE — Progress Notes (Signed)
12/03/2015 Cindy [name]   1922/05/22  ID:[id number]  Primary Physician [name], MD Primary Cardiologist: Lorretta Harp MD Renae Gloss  HPI:  The patient returns today for annual followup. She is a delightful 82 -year-old thin and frail appearing married Caucasian female whose husband Cindy [name] is also a patient of mine. I last saw her in the office 10/25/14.... She has a history of normal coronary arteries by cath in 2005 and again in 2007. She has chronic left bundle branch block, hypertension and hyperlipidemia. She also has GERD, followed by Dr. Carol Ada with an endoscopy in the past. [name] followed her lipid profile. Since I saw her back in the office 10/25/14, she's remained clinically stable specifically denying chest pain or shortness of breath.   Current Outpatient Prescriptions  Medication Sig Dispense Refill  . aspirin 81 MG tablet Take 81 mg by mouth daily.    . Calcium Carbonate (CALTRATE 600 PO) Take 1,200 mg by mouth 2 (two) times daily.     Marland Kitchen CRANBERRY PO Take 1 tablet by mouth 3 (three) times daily.    . furosemide (LASIX) 20 MG tablet Take 20 mg by mouth daily.    Astrid Drafts Omega-3 300 MG CAPS Take 1 capsule by mouth daily.    Marland Kitchen levothyroxine (SYNTHROID, LEVOTHROID) 75 MCG tablet Take 75 mcg by mouth daily before breakfast.    . loratadine (CLARITIN) 10 MG tablet Take 10 mg by mouth daily.    . Multiple Vitamin (MULTIVITAMIN) capsule Take 1 capsule by mouth daily.    . Multiple Vitamins-Minerals (ICAPS MV PO) Take 1 capsule by mouth daily.     Marland Kitchen omeprazole (PRILOSEC) 20 MG capsule Take 20 mg by mouth daily.    . simvastatin (ZOCOR) 40 MG tablet Take 20 mg by mouth every evening.     . valsartan (DIOVAN) 320 MG tablet Take 320 mg by mouth daily.     No current facility-administered medications for this visit.     Allergies  Allergen Reactions  . Fish Allergy Hives  . Sulfa Antibiotics Other (See Comments)    Reaction unknown     Social History   Social History  . Marital status: Married    Spouse name: N/A  . Number of children: N/A  . Years of education: N/A   Occupational History  . Not on file.   Social History Main Topics  . Smoking status: Never Smoker  . Smokeless tobacco: Never Used  . Alcohol use No  . Drug use: No  . Sexual activity: Not on file   Other Topics Concern  . Not on file   Social History Narrative  . No narrative on file     Review of Systems: General: negative for chills, fever, night sweats or weight changes.  Cardiovascular: negative for chest pain, dyspnea on exertion, edema, orthopnea, palpitations, paroxysmal nocturnal dyspnea or shortness of breath Dermatological: negative for rash Respiratory: negative for cough or wheezing Urologic: negative for hematuria Abdominal: negative for nausea, vomiting, diarrhea, bright red blood per rectum, melena, or hematemesis Neurologic: negative for visual changes, syncope, or dizziness All other systems reviewed and are otherwise negative except as noted above.    Blood pressure (!) 112/50, pulse 60, height 5' (1.524 m), weight 106 lb (48.1 kg).  General appearance: alert and no distress Neck: no adenopathy, no carotid bruit, no JVD, supple, symmetrical, trachea midline and thyroid not enlarged, symmetric, no tenderness/mass/nodules Lungs: clear to auscultation bilaterally Heart:  Soft outflow tract murmur Extremities: extremities normal, atraumatic, no cyanosis or edema  EKG normal sinus rhythm at 60 with left bundle-branch block. I personally reviewed this EKG  ASSESSMENT AND PLAN:   Left bundle branch block Chronic  Essential hypertension History of hypertension with blood pressure measured 112/50. She is on valsartan. Continue current meds at current dosing  Hyperlipidemia History of hyperlipidemia on statin therapy followed by her PCP      Lorretta Harp MD [facility], [facility] 12/03/2015 1:53 PM

## 2015-12-03 NOTE — Assessment & Plan Note (Signed)
History of hypertension with blood pressure measured 112/50. She is on valsartan. Continue current meds at current dosing

## 2015-12-03 NOTE — Patient Instructions (Signed)

## 2015-12-03 NOTE — Assessment & Plan Note (Signed)
Chronic. 

## 2015-12-03 NOTE — Assessment & Plan Note (Signed)
History of hyperlipidemia on statin therapy followed by her PCP. 

## 2016-02-25 DIAGNOSIS — N3281 Overactive bladder: Secondary | ICD-10-CM | POA: Insufficient documentation

## 2016-06-14 DIAGNOSIS — M810 Age-related osteoporosis without current pathological fracture: Secondary | ICD-10-CM | POA: Insufficient documentation

## 2016-06-14 DIAGNOSIS — N184 Chronic kidney disease, stage 4 (severe): Secondary | ICD-10-CM | POA: Diagnosis present

## 2016-06-14 DIAGNOSIS — E039 Hypothyroidism, unspecified: Secondary | ICD-10-CM | POA: Insufficient documentation

## 2016-06-14 DIAGNOSIS — N183 Chronic kidney disease, stage 3 (moderate): Secondary | ICD-10-CM

## 2016-06-27 ENCOUNTER — Encounter (HOSPITAL_BASED_OUTPATIENT_CLINIC_OR_DEPARTMENT_OTHER): Payer: Self-pay | Admitting: Emergency Medicine

## 2016-06-27 ENCOUNTER — Inpatient Hospital Stay (HOSPITAL_BASED_OUTPATIENT_CLINIC_OR_DEPARTMENT_OTHER)
Admission: EM | Admit: 2016-06-27 | Discharge: 2016-07-01 | DRG: 381 | Disposition: A | Discharge status: Home / Self Care | Payer: Medicare Other | Attending: Family Medicine | Admitting: Family Medicine

## 2016-06-27 ENCOUNTER — Emergency Department (HOSPITAL_BASED_OUTPATIENT_CLINIC_OR_DEPARTMENT_OTHER): Payer: Medicare Other

## 2016-06-27 DIAGNOSIS — K56609 Unspecified intestinal obstruction, unspecified as to partial versus complete obstruction: Secondary | ICD-10-CM | POA: Diagnosis present

## 2016-06-27 DIAGNOSIS — E1122 Type 2 diabetes mellitus with diabetic chronic kidney disease: Secondary | ICD-10-CM | POA: Diagnosis present

## 2016-06-27 DIAGNOSIS — K310 Acute dilatation of stomach: Secondary | ICD-10-CM

## 2016-06-27 DIAGNOSIS — E785 Hyperlipidemia, unspecified: Secondary | ICD-10-CM | POA: Diagnosis present

## 2016-06-27 DIAGNOSIS — I129 Hypertensive chronic kidney disease with stage 1 through stage 4 chronic kidney disease, or unspecified chronic kidney disease: Secondary | ICD-10-CM | POA: Diagnosis present

## 2016-06-27 DIAGNOSIS — I447 Left bundle-branch block, unspecified: Secondary | ICD-10-CM | POA: Diagnosis present

## 2016-06-27 DIAGNOSIS — E039 Hypothyroidism, unspecified: Secondary | ICD-10-CM | POA: Diagnosis present

## 2016-06-27 DIAGNOSIS — Z4659 Encounter for fitting and adjustment of other gastrointestinal appliance and device: Secondary | ICD-10-CM

## 2016-06-27 DIAGNOSIS — K219 Gastro-esophageal reflux disease without esophagitis: Secondary | ICD-10-CM | POA: Diagnosis present

## 2016-06-27 DIAGNOSIS — R911 Solitary pulmonary nodule: Secondary | ICD-10-CM | POA: Diagnosis present

## 2016-06-27 DIAGNOSIS — E119 Type 2 diabetes mellitus without complications: Secondary | ICD-10-CM | POA: Diagnosis not present

## 2016-06-27 DIAGNOSIS — N184 Chronic kidney disease, stage 4 (severe): Secondary | ICD-10-CM | POA: Diagnosis present

## 2016-06-27 DIAGNOSIS — Z90710 Acquired absence of both cervix and uterus: Secondary | ICD-10-CM

## 2016-06-27 DIAGNOSIS — H3530 Unspecified macular degeneration: Secondary | ICD-10-CM | POA: Diagnosis present

## 2016-06-27 DIAGNOSIS — Z91013 Allergy to seafood: Secondary | ICD-10-CM

## 2016-06-27 DIAGNOSIS — Z882 Allergy status to sulfonamides status: Secondary | ICD-10-CM | POA: Diagnosis not present

## 2016-06-27 DIAGNOSIS — R1310 Dysphagia, unspecified: Secondary | ICD-10-CM | POA: Diagnosis not present

## 2016-06-27 DIAGNOSIS — Z85820 Personal history of malignant melanoma of skin: Secondary | ICD-10-CM

## 2016-06-27 DIAGNOSIS — Z793 Long term (current) use of hormonal contraceptives: Secondary | ICD-10-CM

## 2016-06-27 DIAGNOSIS — K311 Adult hypertrophic pyloric stenosis: Secondary | ICD-10-CM | POA: Diagnosis not present

## 2016-06-27 DIAGNOSIS — Q791 Other congenital malformations of diaphragm: Secondary | ICD-10-CM

## 2016-06-27 DIAGNOSIS — Z807 Family history of other malignant neoplasms of lymphoid, hematopoietic and related tissues: Secondary | ICD-10-CM

## 2016-06-27 DIAGNOSIS — I160 Hypertensive urgency: Secondary | ICD-10-CM | POA: Diagnosis present

## 2016-06-27 DIAGNOSIS — Z79899 Other long term (current) drug therapy: Secondary | ICD-10-CM

## 2016-06-27 DIAGNOSIS — K449 Diaphragmatic hernia without obstruction or gangrene: Secondary | ICD-10-CM | POA: Diagnosis present

## 2016-06-27 DIAGNOSIS — Z66 Do not resuscitate: Secondary | ICD-10-CM | POA: Diagnosis present

## 2016-06-27 DIAGNOSIS — N183 Chronic kidney disease, stage 3 (moderate): Secondary | ICD-10-CM | POA: Diagnosis present

## 2016-06-27 DIAGNOSIS — Z808 Family history of malignant neoplasm of other organs or systems: Secondary | ICD-10-CM

## 2016-06-27 DIAGNOSIS — I10 Essential (primary) hypertension: Secondary | ICD-10-CM | POA: Diagnosis not present

## 2016-06-27 DIAGNOSIS — M810 Age-related osteoporosis without current pathological fracture: Secondary | ICD-10-CM | POA: Diagnosis present

## 2016-06-27 DIAGNOSIS — R112 Nausea with vomiting, unspecified: Secondary | ICD-10-CM | POA: Diagnosis present

## 2016-06-27 DIAGNOSIS — R1010 Upper abdominal pain, unspecified: Secondary | ICD-10-CM | POA: Diagnosis not present

## 2016-06-27 DIAGNOSIS — K5909 Other constipation: Secondary | ICD-10-CM

## 2016-06-27 LAB — CBC WITH DIFFERENTIAL/PLATELET
Basophils Absolute: 0 10*3/uL (ref 0.0–0.1)
Basophils Relative: 0 %
Eosinophils Absolute: 0.8 10*3/uL — ABNORMAL HIGH (ref 0.0–0.7)
Eosinophils Relative: 12 %
HCT: 35.6 % — ABNORMAL LOW (ref 36.0–46.0)
Hemoglobin: 11.8 g/dL — ABNORMAL LOW (ref 12.0–15.0)
Lymphocytes Relative: 18 %
Lymphs Abs: 1.2 10*3/uL (ref 0.7–4.0)
MCH: 31.6 pg (ref 26.0–34.0)
MCHC: 33.1 g/dL (ref 30.0–36.0)
MCV: 95.2 fL (ref 78.0–100.0)
Monocytes Absolute: 0.8 10*3/uL (ref 0.1–1.0)
Monocytes Relative: 12 %
Neutro Abs: 4 10*3/uL (ref 1.7–7.7)
Neutrophils Relative %: 58 %
Platelets: 222 10*3/uL (ref 150–400)
RBC: 3.74 MIL/uL — ABNORMAL LOW (ref 3.87–5.11)
RDW: 14.5 % (ref 11.5–15.5)
WBC: 6.8 10*3/uL (ref 4.0–10.5)

## 2016-06-27 LAB — COMPREHENSIVE METABOLIC PANEL
ALT: 15 U/L (ref 14–54)
AST: 26 U/L (ref 15–41)
Albumin: 3.5 g/dL (ref 3.5–5.0)
Alkaline Phosphatase: 58 U/L (ref 38–126)
Anion gap: 8 (ref 5–15)
BUN: 24 mg/dL — ABNORMAL HIGH (ref 6–20)
CO2: 20 mmol/L — ABNORMAL LOW (ref 22–32)
Calcium: 9.2 mg/dL (ref 8.9–10.3)
Chloride: 112 mmol/L — ABNORMAL HIGH (ref 101–111)
Creatinine, Ser: 1.27 mg/dL — ABNORMAL HIGH (ref 0.44–1.00)
GFR calc Af Amer: 41 mL/min — ABNORMAL LOW (ref >60)
GFR calc non Af Amer: 36 mL/min — ABNORMAL LOW (ref >60)
Glucose, Bld: 158 mg/dL — ABNORMAL HIGH (ref 65–99)
Potassium: 3.9 mmol/L (ref 3.5–5.1)
Sodium: 140 mmol/L (ref 135–145)
Total Bilirubin: 0.3 mg/dL (ref 0.3–1.2)
Total Protein: 6.8 g/dL (ref 6.5–8.1)

## 2016-06-27 LAB — LIPASE, BLOOD: Lipase: 39 U/L (ref 11–51)

## 2016-06-27 LAB — TROPONIN I: Troponin I: <0.03 ng/mL (ref <0.03)

## 2016-06-27 LAB — I-STAT CG4 LACTIC ACID, ED: Lactic Acid, Venous: 1.91 mmol/L (ref 0.5–1.9)

## 2016-06-27 MED ORDER — SODIUM CHLORIDE 0.9 % IV BOLUS (SEPSIS)
1000.0000 mL | Freq: Once | INTRAVENOUS | Status: AC
  Administered 2016-06-27: 1000 mL via INTRAVENOUS

## 2016-06-27 MED ORDER — PROCHLORPERAZINE EDISYLATE 5 MG/ML IJ SOLN: 5.0000 mg | INTRAMUSCULAR | Status: DC | PRN

## 2016-06-27 MED ORDER — ACETAMINOPHEN 650 MG RE SUPP: 650.0000 mg | Freq: Four times a day (QID) | RECTAL | Status: DC | PRN

## 2016-06-27 MED ORDER — ALUM & MAG HYDROXIDE-SIMETH 200-200-20 MG/5ML PO SUSP: 30.0000 mL | Freq: Four times a day (QID) | ORAL | Status: DC | PRN

## 2016-06-27 MED ORDER — MENTHOL 3 MG MT LOZG
1.0000 | LOZENGE | OROMUCOSAL | Status: DC | PRN
  Filled 2016-06-27: qty 9

## 2016-06-27 MED ORDER — MORPHINE SULFATE (PF) 2 MG/ML IV SOLN
2.0000 mg | Freq: Once | INTRAVENOUS | Status: AC
  Administered 2016-06-27: 2 mg via INTRAVENOUS
  Filled 2016-06-27: qty 1

## 2016-06-27 MED ORDER — BISACODYL 10 MG RE SUPP: 10.0000 mg | Freq: Two times a day (BID) | RECTAL | Status: DC | PRN

## 2016-06-27 MED ORDER — DEXTROSE-NACL 5-0.45 % IV SOLN
INTRAVENOUS | Status: DC
Start: 2016-06-27 — End: 2016-06-28
  Administered 2016-06-28: via INTRAVENOUS

## 2016-06-27 MED ORDER — ALBUTEROL SULFATE (2.5 MG/3ML) 0.083% IN NEBU: 2.5000 mg | INHALATION_SOLUTION | Freq: Four times a day (QID) | RESPIRATORY_TRACT | Status: DC | PRN

## 2016-06-27 MED ORDER — LIP MEDEX EX OINT: 1.0000 "application " | TOPICAL_OINTMENT | Freq: Two times a day (BID) | CUTANEOUS | Status: DC

## 2016-06-27 MED ORDER — ONDANSETRON HCL 4 MG/2ML IJ SOLN
4.0000 mg | Freq: Once | INTRAMUSCULAR | Status: AC
  Administered 2016-06-27: 4 mg via INTRAVENOUS
  Filled 2016-06-27: qty 2

## 2016-06-27 MED ORDER — BISACODYL 10 MG RE SUPP
10.0000 mg | Freq: Every day | RECTAL | Status: DC
  Administered 2016-06-28 – 2016-07-01 (×4): 10 mg via RECTAL
  Filled 2016-06-27 (×4): qty 1

## 2016-06-27 MED ORDER — PHENOL 1.4 % MT LIQD
2.0000 | OROMUCOSAL | Status: DC | PRN
  Filled 2016-06-27: qty 177

## 2016-06-27 MED ORDER — MAGIC MOUTHWASH
15.0000 mL | Freq: Four times a day (QID) | ORAL | Status: DC | PRN
  Filled 2016-06-27: qty 15

## 2016-06-27 MED ORDER — IOPAMIDOL (ISOVUE-300) INJECTION 61%
100.0000 mL | Freq: Once | INTRAVENOUS | Status: AC | PRN
  Administered 2016-06-27: 100 mL via INTRAVENOUS

## 2016-06-27 MED ORDER — FENTANYL CITRATE (PF) 100 MCG/2ML IJ SOLN: 75.0000 ug | Freq: Once | INTRAMUSCULAR | Status: DC

## 2016-06-27 MED ORDER — LACTATED RINGERS IV BOLUS (SEPSIS): 1000.0000 mL | Freq: Three times a day (TID) | INTRAVENOUS | Status: DC | PRN

## 2016-06-27 MED ORDER — ONDANSETRON HCL 4 MG/2ML IJ SOLN: 4.0000 mg | Freq: Four times a day (QID) | INTRAMUSCULAR | Status: DC | PRN

## 2016-06-27 MED ORDER — FENTANYL CITRATE (PF) 100 MCG/2ML IJ SOLN: 25.0000 ug | INTRAMUSCULAR | Status: DC | PRN

## 2016-06-27 MED ORDER — FAMOTIDINE IN NACL 20-0.9 MG/50ML-% IV SOLN
20.0000 mg | Freq: Once | INTRAVENOUS | Status: AC
  Administered 2016-06-27: 20 mg via INTRAVENOUS
  Filled 2016-06-27: qty 50

## 2016-06-27 MED ORDER — ONDANSETRON HCL 40 MG/20ML IJ SOLN
8.0000 mg | Freq: Four times a day (QID) | INTRAMUSCULAR | Status: DC | PRN
  Filled 2016-06-27: qty 4

## 2016-06-27 NOTE — ED Notes (Signed)
Patient transported to CT 

## 2016-06-27 NOTE — ED Triage Notes (Signed)
Severe upper abd pain x 1 hour. Nausea without vomiting.

## 2016-06-27 NOTE — Progress Notes (Signed)
Called regarding this patient who is at [facility].  She presented with abdominal pain and nausea and belching.  CT shows SBO at pylorus vs. Gastric outlet obstruction.  Has a large hiatal hernia which is collapsing the left lung.  Sats 94-96% on oxygen.  Gen Surgery was consulted and recommended NG tube placement and admission to the hospitalist service.  They will plan to see the patient in the AM.  Carlyon Shadow, M.D.

## 2016-06-27 NOTE — ED Notes (Addendum)
Pt states her pain has moved into her R shoulder and is severe.

## 2016-06-27 NOTE — ED Notes (Signed)
Family came out of room yelling for staff that something is wrong. When this RN entered room O2 sats 80% on room air, RR 49, ectopy noted on monitor,  pt attempting to cough but cough not, face was red. O2 4 L placed on patient sats up to 100% within 8 minutes then O2 turned down to 2L [place], RR 26.

## 2016-06-27 NOTE — ED Provider Notes (Signed)
[facility] Provider Note   CSN: [id number] Arrival date & time: 06/27/16  2025   By signing my name below, I, Delton Prairie, attest that this documentation has been prepared under the direction and in the presence of Deno Etienne, DO  Electronically Signed: Delton Prairie, ED Scribe. 06/27/16. 8:55 PM.   History   Chief Complaint Chief Complaint  Patient presents with  . Abdominal Pain   The history is provided by the patient. No language interpreter was used.   HPI Comments:  Cindy [name] is a 83 y.o. female, with a PMHx of GERD and HTN, who presents to the Emergency Department complaining of acute onset, moderate upper abdominal pain which began around 8 PM today. Pt also reports nausea and belching. She states her last meal was at 3 PM today. No alleviating factors noted. Pt denies radiation of her pain, any vomiting, fevers, chills, diarrhea, a hx of abdominal surgeries, a hx of similar symptoms or any other associated symptoms. No other complaints noted.   Past Medical History:  Diagnosis Date  . Chronic renal insufficiency, stage III (moderate)   . Dyslipidemia   . GERD (gastroesophageal reflux disease)   . Hiatal hernia   . History of nuclear stress test 2005   persantine; inferoapical ischmeia with EF 83%  . Hypertension   . Hypothyroidism   . LBBB (left bundle branch block)   . Lung nodule 07/01/03   right middle lobe  . Macular degeneration   . Malignant melanoma ([place]) 11/12/04   L upper arm  . Murmur, cardiac   . Osteoporosis     Patient Active Problem List   Diagnosis Date Noted  . Left bundle branch block 12/14/2012  . Essential hypertension 12/14/2012  . Hyperlipidemia 12/14/2012  . Pain in shoulder 11/11/2011  . Melanoma of left upper arm ([place]) 10/19/2011  . Lung mass 06/16/2011  . Acquired deformities of toe 06/14/2011  . Personal history of other diseases of the digestive system 06/07/2011    Past Surgical History:  Procedure Laterality  Date  . BLADDER REPAIR    . CARDIAC CATHETERIZATION  2005/2007   normal coronaries   . CATARACT EXTRACTION     bilat  . coccyx  1951   excision  . knee     right cartilage repair  . ROTATOR CUFF REPAIR     right  . TOTAL ABDOMINAL HYSTERECTOMY W/ BILATERAL SALPINGOOPHORECTOMY    . TRANSTHORACIC ECHOCARDIOGRAM  2007   EF=>55%; borderline conc LVH; LA mod dilated; RA mild-mod dilated; mild TR; mild AVR with mildly sclerotic AV leavlefts; mild PV regurg     OB History    No data available       Home Medications    Prior to Admission medications   Medication Sig Start Date End Date Taking? Authorizing Provider  furosemide (LASIX) 20 MG tablet Take 20 mg by mouth daily.   Yes Historical Provider, MD  levothyroxine (SYNTHROID, LEVOTHROID) 75 MCG tablet Take 75 mcg by mouth daily before breakfast.   Yes Historical Provider, MD  aspirin 81 MG tablet Take 81 mg by mouth daily.    Historical Provider, MD  Calcium Carbonate (CALTRATE 600 PO) Take 1,200 mg by mouth 2 (two) times daily.     Historical Provider, MD  CRANBERRY PO Take 1 tablet by mouth 3 (three) times daily.    Historical Provider, MD  Astrid Drafts Omega-3 300 MG CAPS Take 1 capsule by mouth daily.    Historical Provider, MD  loratadine (CLARITIN) 10 MG tablet Take 10 mg by mouth daily.    Historical Provider, MD  Multiple Vitamin (MULTIVITAMIN) capsule Take 1 capsule by mouth daily.    Historical Provider, MD  Multiple Vitamins-Minerals (ICAPS MV PO) Take 1 capsule by mouth daily.     Historical Provider, MD  omeprazole (PRILOSEC) 20 MG capsule Take 20 mg by mouth daily.    Historical Provider, MD  simvastatin (ZOCOR) 40 MG tablet Take 20 mg by mouth every evening.     Historical Provider, MD  valsartan (DIOVAN) 320 MG tablet Take 320 mg by mouth daily.    Historical Provider, MD    Family History Family History  Problem Relation Age of Onset  . Cancer Father     NHL  . Cancer Maternal Grandmother     rectal  . Cancer  Daughter     breast  . Cancer Maternal Aunt     breast  . Cancer Son     melanoma    Social History Social History  Substance Use Topics  . Smoking status: Never Smoker  . Smokeless tobacco: Never Used  . Alcohol use No     Allergies   Fish allergy and Sulfa antibiotics   Review of Systems Review of Systems  Constitutional: Negative for chills and fever.  HENT: Negative for congestion and rhinorrhea.   Eyes: Negative for redness and visual disturbance.  Respiratory: Negative for shortness of breath and wheezing.   Cardiovascular: Negative for chest pain and palpitations.  Gastrointestinal: Positive for abdominal pain and nausea. Negative for vomiting.  Genitourinary: Negative for dysuria and urgency.  Musculoskeletal: Negative for arthralgias and myalgias.  Skin: Negative for pallor and wound.  Neurological: Negative for dizziness and headaches.   Physical Exam Updated Vital Signs BP (!) 201/85   Pulse 60   Temp 97.6 F (36.4 C) (Oral)   Resp 18   Wt 105 lb (47.6 kg)   SpO2 98%   BMI 20.51 kg/m   Physical Exam  Constitutional: She is oriented to person, place, and time. She appears well-developed and well-nourished. No distress.  HENT:  Head: Normocephalic and atraumatic.  Eyes: EOM are normal. Pupils are equal, round, and reactive to light.  Neck: Normal range of motion. Neck supple.  Cardiovascular: Normal rate and regular rhythm.  Exam reveals no gallop and no friction rub.   No murmur heard. Pulmonary/Chest: Effort normal. She has no wheezes. She has no rales.  Abdominal: Soft. She exhibits no distension. There is tenderness in the epigastric area. There is negative Murphy's sign.  No RUQ tenderness   Musculoskeletal: She exhibits no edema or tenderness.  Neurological: She is alert and oriented to person, place, and time.  Skin: Skin is warm and dry. She is not diaphoretic.  Psychiatric: She has a normal mood and affect. Her behavior is normal.    Nursing note and vitals reviewed.   ED Treatments / Results  DIAGNOSTIC STUDIES:  Oxygen Saturation is 100% on RA, normal by my interpretation.    COORDINATION OF CARE:  8:53 PM Discussed treatment plan with pt at bedside and pt agreed to plan.  Labs (all labs ordered are listed, but only abnormal results are displayed) Labs Reviewed  CBC WITH DIFFERENTIAL/PLATELET - Abnormal; Notable for the following:       Result Value   RBC 3.74 (*)    Hemoglobin 11.8 (*)    HCT 35.6 (*)    Eosinophils Absolute 0.8 (*)    All other  components within normal limits  COMPREHENSIVE METABOLIC PANEL - Abnormal; Notable for the following:    Chloride 112 (*)    CO2 20 (*)    Glucose, Bld 158 (*)    BUN 24 (*)    Creatinine, Ser 1.27 (*)    GFR calc non Af Amer 36 (*)    GFR calc Af Amer 41 (*)    All other components within normal limits  I-STAT CG4 LACTIC ACID, ED - Abnormal; Notable for the following:    Lactic Acid, Venous 1.91 (*)    All other components within normal limits  LIPASE, BLOOD  TROPONIN I  LACTIC ACID, PLASMA  LACTIC ACID, PLASMA    EKG  EKG Interpretation  Date/Time:  Sunday June 27 2016 21:07:20 EDT Ventricular Rate:  68 PR Interval:    QRS Duration: 138 QT Interval:  458 QTC Calculation: 488 R Axis:   -31 Text Interpretation:  Sinus rhythm Left bundle branch block Confirmed by  MD, DANIEL ([id number]) on 06/27/2016 9:28:07 PM       Radiology No results found.  Procedures Procedures (including critical care time)  Medications Ordered in ED Medications  fentaNYL (SUBLIMAZE) injection 75 mcg (not administered)  sodium chloride 0.9 % bolus 1,000 mL (1,000 mLs Intravenous New Bag/Given 06/27/16 2113)  ondansetron (ZOFRAN) injection 4 mg (4 mg Intravenous Given 06/27/16 2108)  morphine 2 MG/ML injection 2 mg (2 mg Intravenous Given 06/27/16 2132)  famotidine (PEPCID) IVPB 20 mg premix (20 mg Intravenous New Bag/Given 06/27/16 2154)  iopamidol (ISOVUE-300)  61 % injection 100 mL (100 mLs Intravenous Contrast Given 06/27/16 2226)     Initial Impression / Assessment and Plan / ED Course  I have reviewed the triage vital signs and the nursing notes.  Pertinent labs & imaging results that were available during my care of the patient were reviewed by me and considered in my medical decision making (see chart for details).     83  yo F With a chief complaint of epigastric abdominal pain. This occurred after she went to bed this evening. The pain then radiated up into her left shoulder. She is having some intermittent trouble breathing with this as well. Patient denied fevers or chills. She has been nauseated and attempted to vomit a couple times without relief. She has been belching continually without improvement. CT of the abdomen and pelvis was concerning for a large hiatal hernia with compression of the left lung. I discussed this with the radiologist as well with general surgery. Dr. Johney Maine suggested that an NG tube be placed and patient transferred and admitted under the medical service. Surgery will evaluate in the morning.  The patients results and plan were reviewed and discussed.   Any x-rays performed were independently reviewed by myself.   Differential diagnosis were considered with the presenting HPI.  Medications  fentaNYL (SUBLIMAZE) injection 75 mcg (not administered)  sodium chloride 0.9 % bolus 1,000 mL (1,000 mLs Intravenous New Bag/Given 06/27/16 2113)  ondansetron [facility]) injection 4 mg (4 mg Intravenous Given 06/27/16 2108)  morphine 2 MG/ML injection 2 mg (2 mg Intravenous Given 06/27/16 2132)  famotidine (PEPCID) IVPB 20 mg premix (20 mg Intravenous New Bag/Given 06/27/16 2154)  iopamidol (ISOVUE-300) 61 % injection 100 mL (100 mLs Intravenous Contrast Given 06/27/16 2226)    Vitals:   06/27/16 2031 06/27/16 2130 06/27/16 2149 06/27/16 2300  BP:  (!) 199/101  (!) 201/85  Pulse:  66  60  Resp:  (!) 22  18  Temp:   97.6 F  (36.4 C)   TempSrc:   Oral   SpO2:  94%  98%  Weight: 105 lb (47.6 kg)       Final diagnoses:  Gastric outlet obstruction  SBO (small bowel obstruction)    Admission/ observation were discussed with the admitting physician, patient and/or family and they are comfortable with the plan.    Final Clinical Impressions(s) / ED Diagnoses   Final diagnoses:  Gastric outlet obstruction  SBO (small bowel obstruction)    New Prescriptions New Prescriptions   No medications on file  I personally performed the services described in this documentation, which was scribed in my presence. The recorded information has been reviewed and is accurate.      Deno Etienne, DO 06/27/16 2318

## 2016-06-27 NOTE — ED Notes (Signed)
ED Provider at bedside. 

## 2016-06-27 NOTE — ED Notes (Signed)
Pt returned from CT °

## 2016-06-28 ENCOUNTER — Inpatient Hospital Stay (HOSPITAL_BASED_OUTPATIENT_CLINIC_OR_DEPARTMENT_OTHER): Payer: Medicare Other

## 2016-06-28 ENCOUNTER — Encounter (HOSPITAL_COMMUNITY): Payer: Self-pay | Admitting: Internal Medicine

## 2016-06-28 DIAGNOSIS — K5909 Other constipation: Secondary | ICD-10-CM

## 2016-06-28 DIAGNOSIS — K56609 Unspecified intestinal obstruction, unspecified as to partial versus complete obstruction: Secondary | ICD-10-CM

## 2016-06-28 DIAGNOSIS — R1310 Dysphagia, unspecified: Secondary | ICD-10-CM

## 2016-06-28 DIAGNOSIS — K310 Acute dilatation of stomach: Secondary | ICD-10-CM

## 2016-06-28 LAB — CBC WITH DIFFERENTIAL/PLATELET
Basophils Absolute: 0 10*3/uL (ref 0.0–0.1)
Basophils Relative: 0 %
Eosinophils Absolute: 0.1 10*3/uL (ref 0.0–0.7)
Eosinophils Relative: 1 %
HCT: 33.2 % — ABNORMAL LOW (ref 36.0–46.0)
Hemoglobin: 11.3 g/dL — ABNORMAL LOW (ref 12.0–15.0)
Lymphocytes Relative: 13 %
Lymphs Abs: 1 10*3/uL (ref 0.7–4.0)
MCH: 31.8 pg (ref 26.0–34.0)
MCHC: 34 g/dL (ref 30.0–36.0)
MCV: 93.5 fL (ref 78.0–100.0)
Monocytes Absolute: 0.4 10*3/uL (ref 0.1–1.0)
Monocytes Relative: 6 %
Neutro Abs: 6.2 10*3/uL (ref 1.7–7.7)
Neutrophils Relative %: 80 %
Platelets: 210 10*3/uL (ref 150–400)
RBC: 3.55 MIL/uL — ABNORMAL LOW (ref 3.87–5.11)
RDW: 14.7 % (ref 11.5–15.5)
WBC: 7.7 10*3/uL (ref 4.0–10.5)

## 2016-06-28 LAB — LACTIC ACID, PLASMA: Lactic Acid, Venous: 1.3 mmol/L (ref 0.5–1.9)

## 2016-06-28 LAB — COMPREHENSIVE METABOLIC PANEL
ALT: 21 U/L (ref 14–54)
AST: 28 U/L (ref 15–41)
Albumin: 3.4 g/dL — ABNORMAL LOW (ref 3.5–5.0)
Alkaline Phosphatase: 51 U/L (ref 38–126)
Anion gap: 7 (ref 5–15)
BUN: 20 mg/dL (ref 6–20)
CO2: 23 mmol/L (ref 22–32)
Calcium: 9.1 mg/dL (ref 8.9–10.3)
Chloride: 109 mmol/L (ref 101–111)
Creatinine, Ser: 1.05 mg/dL — ABNORMAL HIGH (ref 0.44–1.00)
GFR calc Af Amer: 52 mL/min — ABNORMAL LOW (ref >60)
GFR calc non Af Amer: 45 mL/min — ABNORMAL LOW (ref >60)
Glucose, Bld: 152 mg/dL — ABNORMAL HIGH (ref 65–99)
Potassium: 4 mmol/L (ref 3.5–5.1)
Sodium: 139 mmol/L (ref 135–145)
Total Bilirubin: 0.4 mg/dL (ref 0.3–1.2)
Total Protein: 6.8 g/dL (ref 6.5–8.1)

## 2016-06-28 LAB — GLUCOSE, CAPILLARY
Glucose-Capillary: 127 mg/dL — ABNORMAL HIGH (ref 65–99)
Glucose-Capillary: 100 mg/dL — ABNORMAL HIGH (ref 65–99)

## 2016-06-28 MED ORDER — MAGIC MOUTHWASH
15.0000 mL | Freq: Four times a day (QID) | ORAL | Status: DC | PRN
  Filled 2016-06-28: qty 15

## 2016-06-28 MED ORDER — DEXTROSE-NACL 5-0.9 % IV SOLN
INTRAVENOUS | Status: AC
  Administered 2016-06-28: 05:00:00 via INTRAVENOUS

## 2016-06-28 MED ORDER — ACETAMINOPHEN 325 MG PO TABS
650.0000 mg | ORAL_TABLET | Freq: Four times a day (QID) | ORAL | Status: DC | PRN
  Filled 2016-06-28: qty 2

## 2016-06-28 MED ORDER — HYDRALAZINE HCL 20 MG/ML IJ SOLN
10.0000 mg | INTRAMUSCULAR | Status: DC | PRN
  Administered 2016-06-28 – 2016-06-30 (×3): 10 mg via INTRAVENOUS
  Filled 2016-06-28 (×3): qty 1

## 2016-06-28 MED ORDER — PHENOL 1.4 % MT LIQD
2.0000 | OROMUCOSAL | Status: DC | PRN
  Filled 2016-06-28: qty 177

## 2016-06-28 MED ORDER — MORPHINE SULFATE (PF) 4 MG/ML IV SOLN
2.0000 mg | Freq: Once | INTRAVENOUS | Status: AC
  Administered 2016-06-28: 2 mg via INTRAVENOUS
  Filled 2016-06-28: qty 1

## 2016-06-28 MED ORDER — MENTHOL 3 MG MT LOZG
1.0000 | LOZENGE | OROMUCOSAL | Status: DC | PRN
  Filled 2016-06-28: qty 9

## 2016-06-28 MED ORDER — MORPHINE SULFATE (PF) 4 MG/ML IV SOLN
1.0000 mg | INTRAVENOUS | Status: DC | PRN
  Administered 2016-06-28: 1 mg via INTRAVENOUS
  Filled 2016-06-28: qty 1

## 2016-06-28 MED ORDER — LEVOTHYROXINE SODIUM 100 MCG IV SOLR
37.5000 ug | Freq: Every day | INTRAVENOUS | Status: DC
  Administered 2016-06-29 – 2016-06-30 (×2): 37.5 ug via INTRAVENOUS
  Filled 2016-06-28 (×3): qty 5

## 2016-06-28 MED ORDER — ALUM & MAG HYDROXIDE-SIMETH 200-200-20 MG/5ML PO SUSP: 30.0000 mL | Freq: Four times a day (QID) | ORAL | Status: DC | PRN

## 2016-06-28 MED ORDER — ACETAMINOPHEN 650 MG RE SUPP: 650.0000 mg | Freq: Four times a day (QID) | RECTAL | Status: DC | PRN

## 2016-06-28 MED ORDER — LIP MEDEX EX OINT
1.0000 "application " | TOPICAL_OINTMENT | Freq: Two times a day (BID) | CUTANEOUS | Status: DC
  Administered 2016-06-28 – 2016-07-01 (×7): 1 via TOPICAL
  Filled 2016-06-28 (×3): qty 7

## 2016-06-28 MED ORDER — KETOROLAC TROMETHAMINE 15 MG/ML IJ SOLN
15.0000 mg | Freq: Once | INTRAMUSCULAR | Status: AC
  Administered 2016-06-28: 15 mg via INTRAVENOUS
  Filled 2016-06-28: qty 1

## 2016-06-28 NOTE — Consult Note (Addendum)
[place]  [address] [place], [place] [address] Phone: [phone] FAX: [phone]     Djuna Frechette  Jul 28, 1922 [id number]  CARE TEAM:  PCP: [name], MD  Outpatient Care Team: Patient Care Team: Lorne Skeens, MD as PCP - General (Endocrinology) Carol Ada, MD as Consulting Physician (Gastroenterology) Lorretta Harp, MD as Consulting Physician (Cardiology) Marti Sleigh, MD as Consulting Physician (Gynecology) Gaynelle Arabian, MD as Consulting Physician (Orthopedic Surgery)  Inpatient Treatment Team: Treatment Team: Attending Provider: Tawni Millers, MD; Consulting Physician: Nolon Nations, MD; Registered Nurse: Don Perking, RN; Rounding Team: Redmond Baseman, MD   This patient is a 83 y.o.female who presents today for surgical evaluation at the request of Dr Deno Etienne, [facility] ED.   Reason for evaluation: SBO/ileus, more likely gastric outlet obstruction.  83 year old woman who denies any prior abdominal issues.  Came into [place] or [place] after having crampy abdominal pain after eating.  Value by emergency room.  CAT scan done showing giant stomach with possible small bowel obstruction near pylorus (?)  Uncontrolled.  Hypertension.  Some heartburn and reflux.  Some question of prior endoscopy and followed by Dr. Carol Ada with [facility].  She has also been evaluated by gastrology for reflux at [place] mesh she had occasional proton pump inhibitor. Given her medical complexity recommended medicine evaluation for probable admission with surgical consultation.  Recommended a nasogastric tube.  They were apparently putting one in.  Patient now at [facility] with no nasogastric tube.  The emergency physicians commented that she had been having vomiting, but the patient denies this.  She claims is having some flatus.  Denies much in the crampy abdominal  pain.  She has a significant stool burden in her abdomen, she claims she moves her bowels every day.  She wants to eat.  No fevers or chills.  No sick contacts.  No personal nor family history of GI/colon cancer, inflammatory bowel disease, irritable bowel syndrome, allergy such as Celiac Sprue, dietary/dairy problems, colitis, ulcers nor gastritis.  No recent sick contacts/gastroenteritis.  No travel outside the country.  No changes in diet.  No dysphagia to solids or liquids.   No hematochezia, hematemesis, coffee ground emesis.  No evidence of prior gastric/peptic ulceration.  She claims she moves her bowels every day.   Assessment  [name]  83 y.o. female       Problem List:  Principal Problem:   SBO (small bowel obstruction) Active Problems:   Eventration of left crus of diaphragm   Uncontrolled hypertension   Gastric outlet obstruction   Operative abdominal crampy abdominal pain and possible emesis with giant stomach even draining into the left chest cavity.  Nothing over many years by serial CT scans.  Plan:  I doubt she has a true small bowel obstruction.  Most likely etiology for her enlarging stomach could be a partial gastric outlet obstruction from prior gastritis/ulcer disease.  But hard to read.   Difficult to tease out how reliable of a historian this patient is.   If she complains of recurrent nausea bloating or abdominal pain, I would recommend replacing the nasogastric tube & consider gastroenterology consultation with probable EGD endoscopy to rule out mass.  Could consider gastric emptying study if no definite mass to rule out severe gastroenteritis.  .  Aggressive proton pump inhibitors.  Another option to retry by mouth as tolerated.  If she advances  to a solid diet and does not have a significant issues, could favor a diagnosis of just gastroenteritis in a woman who has a giant eventration/chronic hiatal hernia type situation.   She is not toxic.  She does  not have an acute abdomen that requires aggressive surgical intervention. She is not interested in any intervention such as endoscopy or surgery at this time.    -Hypertension control per admitting medicine service.  Follow mental status.  See if there is way to help clarify how reliable historian and functional she is.  Low threshold to consider case management evaluation or speech therapy and cognitive evaluation  -VTE prophylaxis- SCDs, etc -mobilize as tolerated to help recovery    Adin Hector, M.D., F.A.C.S. Gastrointestinal and Minimally Invasive Surgery [facility] [address], [place], [place] [address] [phone] Main / Paging   06/28/2016      Past Medical History:  Diagnosis Date  . Chronic renal insufficiency, stage III (moderate)   . Dyslipidemia   . GERD (gastroesophageal reflux disease)   . Hiatal hernia   . History of nuclear stress test 2005   persantine; inferoapical ischmeia with EF 83%  . Hypertension   . Hypothyroidism   . LBBB (left bundle branch block)   . Lung nodule 07/01/03   right middle lobe  . Macular degeneration   . Malignant melanoma ([facility]) 11/12/04   L upper arm  . Murmur, cardiac   . Osteoporosis     Past Surgical History:  Procedure Laterality Date  . BLADDER REPAIR    . CARDIAC CATHETERIZATION  2005/2007   normal coronaries   . CATARACT EXTRACTION     bilat  . coccyx  1951   excision  . knee     right cartilage repair  . ROTATOR CUFF REPAIR     right  . TOTAL ABDOMINAL HYSTERECTOMY W/ BILATERAL SALPINGOOPHORECTOMY    . TRANSTHORACIC ECHOCARDIOGRAM  2007   EF=>55%; borderline conc LVH; LA mod dilated; RA mild-mod dilated; mild TR; mild AVR with mildly sclerotic AV leavlefts; mild PV regurg     Social History   Social History  . Marital status: Married    Spouse name: N/A  . Number of children: N/A  . Years of education: N/A   Occupational History  . Not on file.   Social  History Main Topics  . Smoking status: Never Smoker  . Smokeless tobacco: Never Used  . Alcohol use No  . Drug use: No  . Sexual activity: Not on file   Other Topics Concern  . Not on file   Social History Narrative  . No narrative on file    Family History  Problem Relation Age of Onset  . Cancer Father     NHL  . Cancer Maternal Grandmother     rectal  . Cancer Daughter     breast  . Cancer Maternal Aunt     breast  . Cancer Son     melanoma    Current Facility-Administered Medications  Medication Dose Route Frequency Provider Last Rate Last Dose  . acetaminophen (TYLENOL) tablet 650 mg  650 mg Oral Q6H PRN Rise Patience, MD       Or  . acetaminophen (TYLENOL) suppository 650 mg  650 mg Rectal Q6H PRN Rise Patience, MD      . albuterol (PROVENTIL) (2.5 MG/3ML) 0.083% nebulizer solution 2.5 mg  2.5 mg Nebulization Q6H PRN Michael Boston, MD      .  bisacodyl (DULCOLAX) suppository 10 mg  10 mg Rectal Daily Michael Boston, MD      . dextrose 5 %-0.9 % sodium chloride infusion   Intravenous Continuous Rise Patience, MD 75 mL/hr at 06/28/16 0458    . hydrALAZINE (APRESOLINE) injection 10 mg  10 mg Intravenous Q4H PRN Rise Patience, MD   10 mg at 06/28/16 0530  . levothyroxine (SYNTHROID, LEVOTHROID) injection 37.5 mcg  37.5 mcg Intravenous Daily Rise Patience, MD         Allergies  Allergen Reactions  . Fish Allergy Hives  . Sulfa Antibiotics Other (See Comments)    Reaction unknown    ROS: Constitutional:  No fevers, chills, sweats.  Weight stable Eyes:  No vision changes, No discharge HENT:  No sore throats, nasal drainage Lymph: No neck swelling, No bruising easily Pulmonary:  No cough, productive sputum CV: No orthopnea, PND  No exertional chest/neck/shoulder/arm pain. GI: No personal nor family history of GI/colon cancer, inflammatory bowel disease, irritable bowel syndrome, allergy such as Celiac Sprue, dietary/dairy problems,  colitis, ulcers nor gastritis.  No recent sick contacts/gastroenteritis.  No travel outside the country.  No changes in diet. Renal: No UTIs, No hematuria Genital:  No drainage, bleeding, masses Musculoskeletal: No severe joint pain.  Good ROM major joints Skin:  No sores or lesions.  No rashes Heme/Lymph:  No easy bleeding.  No swollen lymph nodes Neuro: No focal weakness/numbness.  No seizures Psych: No suicidal ideation.  No hallucinations  BP (!) 128/49 (BP Location: Left Arm)   Pulse 63   Temp (!) 96.9 F (36.1 C) (Oral)   Resp 14   Ht 5' (1.524 m)   Wt 52.2 kg (115 lb 1.3 oz)   SpO2 97%   BMI 22.48 kg/m   Physical Exam: General: Pt  sleeping comfortably.  She awakens alert/oriented x4 in no major acute distress Eyes: PERRL, normal EOM. Sclera nonicteric Neuro: CN II-XII intact w/o focal sensory/motor deficits. Lymph: No head/neck/groin lymphadenopathy Psych:  No delerium/psychosis/paranoia HENT: Normocephalic, Mucus membranes moist.  No thrush Neck: Supple, No tracheal deviation Chest: No pain.  Good respiratory excursion. CV:  Pulses intact.  Regular rhythm Abdomen: Soft but overweight, Nondistended.  Nontender.  No incarcerated hernias. Gen:  No inguinal hernias.  No inguinal lymphadenopathy.   Ext:  SCDs BLE.  No significant edema.  No cyanosis Skin: No petechiae / purpurea.  No major sores Musculoskeletal: No severe joint pain.  Good ROM major joints   Results:   Labs: Results for orders placed or performed during the hospital encounter of 06/27/16 (from the past 48 hour(s))  CBC with Differential     Status: Abnormal   Collection Time: 06/27/16  8:57 PM  Result Value Ref Range   WBC 6.8 4.0 - 10.5 K/uL   RBC 3.74 (L) 3.87 - 5.11 MIL/uL   Hemoglobin 11.8 (L) 12.0 - 15.0 g/dL   HCT 35.6 (L) 36.0 - 46.0 %   MCV 95.2 78.0 - 100.0 fL   MCH 31.6 26.0 - 34.0 pg   MCHC 33.1 30.0 - 36.0 g/dL   RDW 14.5 11.5 - 15.5 %   Platelets 222 150 - 400 K/uL   Neutrophils  Relative % 58 %   Neutro Abs 4.0 1.7 - 7.7 K/uL   Lymphocytes Relative 18 %   Lymphs Abs 1.2 0.7 - 4.0 K/uL   Monocytes Relative 12 %   Monocytes Absolute 0.8 0.1 - 1.0 K/uL   Eosinophils Relative 12 %  Eosinophils Absolute 0.8 (H) 0.0 - 0.7 K/uL   Basophils Relative 0 %   Basophils Absolute 0.0 0.0 - 0.1 K/uL  Comprehensive metabolic panel     Status: Abnormal   Collection Time: 06/27/16  8:57 PM  Result Value Ref Range   Sodium 140 135 - 145 mmol/L   Potassium 3.9 3.5 - 5.1 mmol/L   Chloride 112 (H) 101 - 111 mmol/L   CO2 20 (L) 22 - 32 mmol/L   Glucose, Bld 158 (H) 65 - 99 mg/dL   BUN 24 (H) 6 - 20 mg/dL   Creatinine, Ser 1.27 (H) 0.44 - 1.00 mg/dL   Calcium 9.2 8.9 - 10.3 mg/dL   Total Protein 6.8 6.5 - 8.1 g/dL   Albumin 3.5 3.5 - 5.0 g/dL   AST 26 15 - 41 U/L   ALT 15 14 - 54 U/L   Alkaline Phosphatase 58 38 - 126 U/L   Total Bilirubin 0.3 0.3 - 1.2 mg/dL   GFR calc non Af Amer 36 (L) >60 mL/min   GFR calc Af Amer 41 (L) >60 mL/min    Comment: (NOTE) The eGFR has been calculated using the CKD EPI equation. This calculation has not been validated in all clinical situations. eGFR's persistently <60 mL/min signify possible Chronic Kidney Disease.    Anion gap 8 5 - 15  Lipase, blood     Status: None   Collection Time: 06/27/16  8:57 PM  Result Value Ref Range   Lipase 39 11 - 51 U/L  Troponin I     Status: None   Collection Time: 06/27/16  8:57 PM  Result Value Ref Range   Troponin I <0.03 <0.03 ng/mL  I-Stat CG4 Lactic Acid, ED     Status: Abnormal   Collection Time: 06/27/16  9:59 PM  Result Value Ref Range   Lactic Acid, Venous 1.91 (HH) 0.5 - 1.9 mmol/L  Comprehensive metabolic panel     Status: Abnormal   Collection Time: 06/28/16  4:14 AM  Result Value Ref Range   Sodium 139 135 - 145 mmol/L   Potassium 4.0 3.5 - 5.1 mmol/L   Chloride 109 101 - 111 mmol/L   CO2 23 22 - 32 mmol/L   Glucose, Bld 152 (H) 65 - 99 mg/dL   BUN 20 6 - 20 mg/dL   Creatinine,  Ser 1.05 (H) 0.44 - 1.00 mg/dL   Calcium 9.1 8.9 - 10.3 mg/dL   Total Protein 6.8 6.5 - 8.1 g/dL   Albumin 3.4 (L) 3.5 - 5.0 g/dL   AST 28 15 - 41 U/L   ALT 21 14 - 54 U/L   Alkaline Phosphatase 51 38 - 126 U/L   Total Bilirubin 0.4 0.3 - 1.2 mg/dL   GFR calc non Af Amer 45 (L) >60 mL/min   GFR calc Af Amer 52 (L) >60 mL/min    Comment: (NOTE) The eGFR has been calculated using the CKD EPI equation. This calculation has not been validated in all clinical situations. eGFR's persistently <60 mL/min signify possible Chronic Kidney Disease.    Anion gap 7 5 - 15  CBC with Differential/Platelet     Status: Abnormal   Collection Time: 06/28/16  4:14 AM  Result Value Ref Range   WBC 7.7 4.0 - 10.5 K/uL   RBC 3.55 (L) 3.87 - 5.11 MIL/uL   Hemoglobin 11.3 (L) 12.0 - 15.0 g/dL   HCT 33.2 (L) 36.0 - 46.0 %   MCV 93.5 78.0 - 100.0  fL   MCH 31.8 26.0 - 34.0 pg   MCHC 34.0 30.0 - 36.0 g/dL   RDW 14.7 11.5 - 15.5 %   Platelets 210 150 - 400 K/uL   Neutrophils Relative % 80 %   Neutro Abs 6.2 1.7 - 7.7 K/uL   Lymphocytes Relative 13 %   Lymphs Abs 1.0 0.7 - 4.0 K/uL   Monocytes Relative 6 %   Monocytes Absolute 0.4 0.1 - 1.0 K/uL   Eosinophils Relative 1 %   Eosinophils Absolute 0.1 0.0 - 0.7 K/uL   Basophils Relative 0 %   Basophils Absolute 0.0 0.0 - 0.1 K/uL  Lactic acid, plasma     Status: None   Collection Time: 06/28/16  4:14 AM  Result Value Ref Range   Lactic Acid, Venous 1.3 0.5 - 1.9 mmol/L    Imaging / Studies: Ct Abdomen Pelvis W Contrast  Result Date: 06/27/2016 CLINICAL DATA:  Upper abdominal pain EXAM: CT ABDOMEN AND PELVIS WITH CONTRAST TECHNIQUE: Multidetector CT imaging of the abdomen and pelvis was performed using the standard protocol following bolus administration of intravenous contrast. CONTRAST:  167m ISOVUE-300 IOPAMIDOL (ISOVUE-300) INJECTION 61% COMPARISON:  CT abdomen pelvis 11/22/2013 FINDINGS: Lower chest: There is a large left diaphragmatic hernia with  much of the stomach above the diaphragm. There is bibasilar atelectasis. Right middle lobe nodule has increased in size, now measuring 1.5 cm from previously 1.0 cm. Peripheral right lower lobe nodule measures 4 mm. Hepatobiliary: Unchanged cyst at the inferior tip of the right hepatic lobe. The liver is otherwise normal. Normal gallbladder. Pancreas: Normal pancreatic contours and enhancement. No peripancreatic fluid collection or pancreatic ductal dilatation. Spleen: Normal. Adrenals/Urinary Tract: Normal adrenal glands. There are multiple bilateral renal cysts, the largest of which is located near the left upper pole and measures 2.7 cm. No hydronephrosis or solid renal mass. Stomach/Bowel: The stomach is massively gas distended, with a large portion extending above the left hemidiaphragm do a diaphragmatic defect. There is rectosigmoid diverticulosis without acute inflammation. There is a large amount of stool throughout the colon with a dilated cecum. Much of the small bowel is gas-filled, but not pathologically dilated. There is no intra-abdominal fluid collection. The appendix is not clearly identified. Vascular/Lymphatic: There is atherosclerotic calcification of the non aneurysmal abdominal aorta. No abdominal or pelvic adenopathy. Reproductive: Status post hysterectomy and bilateral salpingo oophorectomy. Musculoskeletal: There is multilevel severe lumbar facet arthrosis. No lumbar spinal canal stenosis. There is grade 1 anterolisthesis at L3-L4, L4-L5 and L5-S1. Other: There are bilateral fat containing inguinal hernias. The right-sided hernia contains a loculated area of fluid measuring 2.3 x 1.6 cm. IMPRESSION: 1. Large amount of stool throughout the colon, mostly gas-filled small bowel and massively dilated and gas-filled stomach. The findings together suggest a distal obstructive process or dysmotility. The cecum is particularly dilated. 2. Large left-sided diaphragmatic hernia containing a  substantial portion of the stomach. 3. Increased size of right middle lobe pulmonary nodule, which now measures 1.5 cm and is concerning for metastatic disease. 4. Aortic atherosclerosis. 5. Multilevel severe lumbar facet arthrosis with associated listhesis. Electronically Signed   By: KUlyses JarredM.D.   On: 06/27/2016 23:21   Dg Abd Acute W/chest  Result Date: 06/27/2016 CLINICAL DATA:  Acute onset abdominal pain. EXAM: DG ABDOMEN ACUTE W/ 1V CHEST COMPARISON:  CT 06/27/2016, 11/22/2013 FINDINGS: The stomach is herniated above the LEFT hemidiaphragm with associated LEFT lower lobe atelectasis. This a chronic finding. The gas-filled stomach is more distended from  comparison exams. Gas distention of the stomach above and below the diaphragm. There is gas throughout the colon and small bowel. No evidence of high-grade obstruction of bowel. No intraperitoneal free air IMPRESSION: 1. No intraperitoneal free air. 2. No Evidence of bowel obstruction. 3. Gas distended stomach. 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 Electronically Signed   By: Suzy Bouchard M.D.   On: 06/27/2016 23:18   Dg Abd Portable 1v  Result Date: 06/28/2016 CLINICAL DATA:  NG tube placement EXAM: PORTABLE ABDOMEN - 1 VIEW COMPARISON:  CT abdomen pelvis 06/27/2016 Abdominal radiograph 06/27/2016 FINDINGS: The tip of the nasogastric tube overlies the fundus of the gas distended, partially intrathoracic stomach. The side port is in the lower esophagus. The tube should be advanced by at least 7 cm. IMPRESSION: NG tube side port in the lower esophagus. Recommend advancement by 7 cm. Electronically Signed   By: Ulyses Jarred M.D.   On: 06/28/2016 00:32    Medications / Allergies: per chart  Antibiotics: Anti-infectives    None        Note: Portions of this report may have been transcribed using voice recognition software. Every effort was made to ensure accuracy; however, inadvertent  computerized transcription errors may be present.   Any transcriptional errors that result from this process are unintentional.    Adin Hector, M.D., F.A.C.S. Gastrointestinal and Minimally Invasive Surgery [facility] [address] [place], [place] [address] [phone] Main / Paging   06/28/2016

## 2016-06-28 NOTE — H&P (Signed)
History and Physical    Cindy [name] ZJQ:[id number] DOB: [birth year] DOA: 06/27/2016  PCP: [name], MD  Patient coming from: Home.  Chief Complaint: Abdominal pain and nausea vomiting.  HPI: Cindy [name] is a 83 y.o. female with history of hypertension, diabetes mellitus type 2, hyperlipidemia presented to the ER with complaints of nausea vomiting abdominal pain. Patient states that last evening patient had gone to dinner with her daughter. Lives in the evening around 7:30 PM patient started developing abdominal pain with nausea vomiting which was severe. Pain was mostly in the epigastric area cramping in nature stabbing at times. Denies any diarrhea fever chills or chest pain or shortness of breath.   ED Course: CT of the abdomen and pelvis done in the ER shows features concerning for small bowel obstruction versus gastric outlet obstruction. NG tube was placed. CT scan also showing left diaphragmatic hernia containing the stomach. On-call surgeon was consulted and patient is being admitted for further management. Patient had NG tube placed initially but was eventually removed. At this time on my exam patient's vomiting and abdominal pain has improved and patient was to hold off NG tube placement until patient gets pain or vomiting again.  Review of Systems: As per HPI, rest all negative.   Past Medical History:  Diagnosis Date  . Chronic renal insufficiency, stage III (moderate)   . Dyslipidemia   . GERD (gastroesophageal reflux disease)   . Hiatal hernia   . History of nuclear stress test 2005   persantine; inferoapical ischmeia with EF 83%  . Hypertension   . Hypothyroidism   . LBBB (left bundle branch block)   . Lung nodule 07/01/03   right middle lobe  . Macular degeneration   . Malignant melanoma ([place]) 11/12/04   L upper arm  . Murmur, cardiac   . Osteoporosis     Past Surgical History:  Procedure Laterality Date  . BLADDER REPAIR    . CARDIAC CATHETERIZATION   2005/2007   normal coronaries   . CATARACT EXTRACTION     bilat  . coccyx  1951   excision  . knee     right cartilage repair  . ROTATOR CUFF REPAIR     right  . TOTAL ABDOMINAL HYSTERECTOMY W/ BILATERAL SALPINGOOPHORECTOMY    . TRANSTHORACIC ECHOCARDIOGRAM  2007   EF=>55%; borderline conc LVH; LA mod dilated; RA mild-mod dilated; mild TR; mild AVR with mildly sclerotic AV leavlefts; mild PV regurg      reports that she has never smoked. She has never used smokeless tobacco. She reports that she does not drink alcohol or use drugs.  Allergies  Allergen Reactions  . Fish Allergy Hives  . Sulfa Antibiotics Other (See Comments)    Reaction unknown    Family History  Problem Relation Age of Onset  . Cancer Father     NHL  . Cancer Maternal Grandmother     rectal  . Cancer Daughter     breast  . Cancer Maternal Aunt     breast  . Cancer Son     melanoma    Prior to Admission medications   Medication Sig Start Date End Date Taking? Authorizing Provider  furosemide (LASIX) 20 MG tablet Take 20 mg by mouth daily.   Yes Historical Provider, MD  levothyroxine (SYNTHROID, LEVOTHROID) 75 MCG tablet Take 75 mcg by mouth daily before breakfast.   Yes Historical Provider, MD  aspirin 81 MG tablet Take 81 mg by mouth daily.  Historical Provider, MD  Calcium Carbonate (CALTRATE 600 PO) Take 1,200 mg by mouth 2 (two) times daily.     Historical Provider, MD  CRANBERRY PO Take 1 tablet by mouth 3 (three) times daily.    Historical Provider, MD  Astrid Drafts Omega-3 300 MG CAPS Take 1 capsule by mouth daily.    Historical Provider, MD  loratadine (CLARITIN) 10 MG tablet Take 10 mg by mouth daily.    Historical Provider, MD  Multiple Vitamin (MULTIVITAMIN) capsule Take 1 capsule by mouth daily.    Historical Provider, MD  Multiple Vitamins-Minerals (ICAPS MV PO) Take 1 capsule by mouth daily.     Historical Provider, MD  omeprazole (PRILOSEC) 20 MG capsule Take 20 mg by mouth daily.     Historical Provider, MD  simvastatin (ZOCOR) 40 MG tablet Take 20 mg by mouth every evening.     Historical Provider, MD  valsartan (DIOVAN) 320 MG tablet Take 320 mg by mouth daily.    Historical Provider, MD    Physical Exam: Vitals:   06/27/16 2330 06/28/16 0000 06/28/16 0030 06/28/16 0148  BP: (!) 203/90 (!) 201/106 (!) 203/84 (!) 222/81  Pulse: 62 63 61 (!) 58  Resp: (!) 32 20 19 16   Temp:    97.7 F (36.5 C)  TempSrc:    Oral  SpO2: 97% 100% 96% 100%  Weight:    52.2 kg (115 lb 1.3 oz)  Height:    5' (1.524 m)      Constitutional: Moderately built and nourished. Vitals:   06/27/16 2330 06/28/16 0000 06/28/16 0030 06/28/16 0148  BP: (!) 203/90 (!) 201/106 (!) 203/84 (!) 222/81  Pulse: 62 63 61 (!) 58  Resp: (!) 32 20 19 16   Temp:    97.7 F (36.5 C)  TempSrc:    Oral  SpO2: 97% 100% 96% 100%  Weight:    52.2 kg (115 lb 1.3 oz)  Height:    5' (1.524 m)   Eyes: Anicteric no pallor. ENMT: No discharge from the ears eyes nose and mouth. Neck: No mass felt. No neck rigidity. Respiratory: No rhonchi or crepitations. Cardiovascular: S1 and S2 heard no murmurs appreciated. Abdomen: Soft bowel sounds are appreciated no guarding or rigidity. Musculoskeletal: No edema. No joint effusion. Skin: No rash. Skin appears warm. Neurologic: Alert awake oriented to time place and person. Moves all extremities. Psychiatric: Appears normal. Normal affect.   Labs on Admission: I have personally reviewed following labs and imaging studies  CBC:  Recent Labs Lab 06/27/16 2057  WBC 6.8  NEUTROABS 4.0  HGB 11.8*  HCT 35.6*  MCV 95.2  PLT 093   Basic Metabolic Panel:  Recent Labs Lab 06/27/16 2057  NA 140  K 3.9  CL 112*  CO2 20*  GLUCOSE 158*  BUN 24*  CREATININE 1.27*  CALCIUM 9.2   GFR: Estimated Creatinine Clearance: 20.7 mL/min (A) (by C-G formula based on SCr of 1.27 mg/dL (H)). Liver Function Tests:  Recent Labs Lab 06/27/16 2057  AST 26  ALT 15    ALKPHOS 58  BILITOT 0.3  PROT 6.8  ALBUMIN 3.5    Recent Labs Lab 06/27/16 2057  LIPASE 39   No results for input(s): AMMONIA in the last 168 hours. Coagulation Profile: No results for input(s): INR, PROTIME in the last 168 hours. Cardiac Enzymes:  Recent Labs Lab 06/27/16 2057  TROPONINI <0.03   BNP (last 3 results) No results for input(s): PROBNP in the last 8760 hours. HbA1C:  No results for input(s): HGBA1C in the last 72 hours. CBG: No results for input(s): GLUCAP in the last 168 hours. Lipid Profile: No results for input(s): CHOL, HDL, LDLCALC, TRIG, CHOLHDL, LDLDIRECT in the last 72 hours. Thyroid Function Tests: No results for input(s): TSH, T4TOTAL, FREET4, T3FREE, THYROIDAB in the last 72 hours. Anemia Panel: No results for input(s): VITAMINB12, FOLATE, FERRITIN, TIBC, IRON, RETICCTPCT in the last 72 hours. Urine analysis:    Component Value Date/Time   COLORURINE YELLOW 11/22/2013 2218   APPEARANCEUR CLOUDY (A) 11/22/2013 2218   LABSPEC 1.019 11/22/2013 2218   PHURINE 5.0 11/22/2013 2218   GLUCOSEU NEGATIVE 11/22/2013 2218   HGBUR LARGE (A) 11/22/2013 2218   BILIRUBINUR NEGATIVE 11/22/2013 2218   KETONESUR NEGATIVE 11/22/2013 2218   PROTEINUR NEGATIVE 11/22/2013 2218   UROBILINOGEN 0.2 11/22/2013 2218   NITRITE NEGATIVE 11/22/2013 2218   LEUKOCYTESUR MODERATE (A) 11/22/2013 2218   Sepsis Labs: @LABRCNTIP (procalcitonin:4,lacticidven:4) )No results found for this or any previous visit (from the past 240 hour(s)).   Radiological Exams on Admission: Ct Abdomen Pelvis W Contrast  Result Date: 06/27/2016 CLINICAL DATA:  Upper abdominal pain EXAM: CT ABDOMEN AND PELVIS WITH CONTRAST TECHNIQUE: Multidetector CT imaging of the abdomen and pelvis was performed using the standard protocol following bolus administration of intravenous contrast. CONTRAST:  174mL ISOVUE-300 IOPAMIDOL (ISOVUE-300) INJECTION 61% COMPARISON:  CT abdomen pelvis 11/22/2013 FINDINGS:  Lower chest: There is a large left diaphragmatic hernia with much of the stomach above the diaphragm. There is bibasilar atelectasis. Right middle lobe nodule has increased in size, now measuring 1.5 cm from previously 1.0 cm. Peripheral right lower lobe nodule measures 4 mm. Hepatobiliary: Unchanged cyst at the inferior tip of the right hepatic lobe. The liver is otherwise normal. Normal gallbladder. Pancreas: Normal pancreatic contours and enhancement. No peripancreatic fluid collection or pancreatic ductal dilatation. Spleen: Normal. Adrenals/Urinary Tract: Normal adrenal glands. There are multiple bilateral renal cysts, the largest of which is located near the left upper pole and measures 2.7 cm. No hydronephrosis or solid renal mass. Stomach/Bowel: The stomach is massively gas distended, with a large portion extending above the left hemidiaphragm do a diaphragmatic defect. There is rectosigmoid diverticulosis without acute inflammation. There is a large amount of stool throughout the colon with a dilated cecum. Much of the small bowel is gas-filled, but not pathologically dilated. There is no intra-abdominal fluid collection. The appendix is not clearly identified. Vascular/Lymphatic: There is atherosclerotic calcification of the non aneurysmal abdominal aorta. No abdominal or pelvic adenopathy. Reproductive: Status post hysterectomy and bilateral salpingo oophorectomy. Musculoskeletal: There is multilevel severe lumbar facet arthrosis. No lumbar spinal canal stenosis. There is grade 1 anterolisthesis at L3-L4, L4-L5 and L5-S1. Other: There are bilateral fat containing inguinal hernias. The right-sided hernia contains a loculated area of fluid measuring 2.3 x 1.6 cm. IMPRESSION: 1. Large amount of stool throughout the colon, mostly gas-filled small bowel and massively dilated and gas-filled stomach. The findings together suggest a distal obstructive process or dysmotility. The cecum is particularly dilated. 2.  Large left-sided diaphragmatic hernia containing a substantial portion of the stomach. 3. Increased size of right middle lobe pulmonary nodule, which now measures 1.5 cm and is concerning for metastatic disease. 4. Aortic atherosclerosis. 5. Multilevel severe lumbar facet arthrosis with associated listhesis. Electronically Signed   By: Ulyses Jarred M.D.   On: 06/27/2016 23:21   Dg Abd Acute W/chest  Result Date: 06/27/2016 CLINICAL DATA:  Acute onset abdominal pain. EXAM: DG ABDOMEN ACUTE W/ 1V  CHEST COMPARISON:  CT 06/27/2016, 11/22/2013 FINDINGS: The stomach is herniated above the LEFT hemidiaphragm with associated LEFT lower lobe atelectasis. This a chronic finding. The gas-filled stomach is more distended from comparison exams. Gas distention of the stomach above and below the diaphragm. There is gas throughout the colon and small bowel. No evidence of high-grade obstruction of bowel. No intraperitoneal free air IMPRESSION: 1. No intraperitoneal free air. 2. No Evidence of bowel obstruction. 3. Gas distended stomach. 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 Electronically Signed   By: Suzy Bouchard M.D.   On: 06/27/2016 23:18   Dg Abd Portable 1v  Result Date: 06/28/2016 CLINICAL DATA:  NG tube placement EXAM: PORTABLE ABDOMEN - 1 VIEW COMPARISON:  CT abdomen pelvis 06/27/2016 Abdominal radiograph 06/27/2016 FINDINGS: The tip of the nasogastric tube overlies the fundus of the gas distended, partially intrathoracic stomach. The side port is in the lower esophagus. The tube should be advanced by at least 7 cm. IMPRESSION: NG tube side port in the lower esophagus. Recommend advancement by 7 cm. Electronically Signed   By: Ulyses Jarred M.D.   On: 06/28/2016 00:32    Assessment/Plan Principal Problem:   SBO (small bowel obstruction) Active Problems:   Eventration of left crus of diaphragm   Uncontrolled hypertension   Gastric outlet obstruction     1. Small bowel obstruction versus gastric or colon obstruction with diaphragmatic hernia - patient will be kept nothing by mouth. Initially NG tube was placed but was removed. Patient at this time is requesting NG tube to be held until patient develops abdominal pain or vomiting again. Will continue with IV fluids. Gen. surgery to follow. 2. Hypertensive urgency - I have placed patient on when necessary IV hydralazine. Closely follow blood pressure trends. 3. Right lung mass concerning for malignancy - will need further workup for this. 4. Diabetes mellitus type 2 - will place patient on sliding scale coverage. 5. Hyperlipidemia - statins to be continued once patient can take orally.   DVT prophylaxis: SCDs. Code Status: DO NOT RESUSCITATE.  Family Communication: Discussed with patient.  Disposition Plan: Home.  Consults called: General surgery by ER physician.  Admission status: Inpatient.    Rise Patience MD [facility] Pager [phone].  If 7PM-7AM, please contact night-coverage [email or web address] Password [facility]  06/28/2016, 3:56 AM

## 2016-06-28 NOTE — Progress Notes (Signed)
PROGRESS NOTE    Cindy [name]  UTM:[id number] DOB: 06/19/1922 DOA: 06/27/2016 PCP: [name], MD    Brief Narrative:  83 yo female presents with abdominal pain associated with nausea. Acute in presentation, epigastric. On the physical examination patient was found hypertensive with blood pressure systolic 568. Her abdomen distended. CT with large left sided diaphragmatic hernia. Admitted for small bowel obstruction.    Assessment & Plan:   Principal Problem:   SBO (small bowel obstruction) Active Problems:   Eventration of left crus of diaphragm   Uncontrolled hypertension   CKD (chronic kidney disease) stage 3, GFR 30-59 ml/min   Gastric outlet obstruction   Constipation, chronic   Acute distention of stomach  1. Suspected bowel obstruction. Clinically improved, will check barium swallow first to assess possible hiatal hernia, will continue IV fluids and pain control, follow on surgery recommendations.   2. Hypertensive urgency. Improved blood pressure control will continue on as needed hydralazine.   3. T2DM. Will continue glucose cover and monitoring.   4. Dyslipidemia. Continue statin therapy.   5. Hypothyroid. Continue levothyroxine.     DVT prophylaxis: enoxaparin  Code Status: full  Family Communication: I spoke with patient's friends per her request.  Disposition Plan: Home  Consultants:     Procedures:    Antimicrobials:    Subjective: Patient feeling better, no nausea or vomiting, abdominal pain has improved.   Objective: Vitals:   06/28/16 0148 06/28/16 0519 06/28/16 0559 06/28/16 1524  BP: (!) 222/81 (!) 182/78 (!) 128/49 (!) 163/56  Pulse: (!) 58  63 (!) 55  Resp: 16  14 16   Temp: 97.7 F (36.5 C)  (!) 96.9 F (36.1 C) 98.1 F (36.7 C)  TempSrc: Oral  Oral Oral  SpO2: 100%  97% 98%  Weight: 52.2 kg (115 lb 1.3 oz)     Height: 5' (1.524 m)       Intake/Output Summary (Last 24 hours) at 06/28/16 1615 Last data filed at  06/28/16 0026  Gross per 24 hour  Intake               70 ml  Output                0 ml  Net               70 ml   Filed Weights   06/27/16 2031 06/28/16 0148  Weight: 47.6 kg (105 lb) 52.2 kg (115 lb 1.3 oz)    Examination:  General exam: not in pain or dyspnea E ENT: no pallor or icterus Respiratory system: Clear to auscultation. Respiratory effort normal. No wheezing, rales or rhonchi.  Cardiovascular system: S1 & S2 heard, RRR. No JVD, murmurs, rubs, gallops or clicks. No pedal edema. Gastrointestinal system: Abdomen is nondistended, soft and nontender. No organomegaly or masses felt. Normal bowel sounds heard. Central nervous system: Alert and oriented. No focal neurological deficits. Extremities: Symmetric 5 x 5 power. Skin: No rashes, lesions or ulcers     Data Reviewed: I have personally reviewed following labs and imaging studies  CBC:  Recent Labs Lab 06/27/16 2057 06/28/16 0414  WBC 6.8 7.7  NEUTROABS 4.0 6.2  HGB 11.8* 11.3*  HCT 35.6* 33.2*  MCV 95.2 93.5  PLT 222 127   Basic Metabolic Panel:  Recent Labs Lab 06/27/16 2057 06/28/16 0414  NA 140 139  K 3.9 4.0  CL 112* 109  CO2 20* 23  GLUCOSE 158* 152*  BUN 24* 20  CREATININE  1.27* 1.05*  CALCIUM 9.2 9.1   GFR: Estimated Creatinine Clearance: 25.1 mL/min (A) (by C-G formula based on SCr of 1.05 mg/dL (H)). Liver Function Tests:  Recent Labs Lab 06/27/16 2057 06/28/16 0414  AST 26 28  ALT 15 21  ALKPHOS 58 51  BILITOT 0.3 0.4  PROT 6.8 6.8  ALBUMIN 3.5 3.4*    Recent Labs Lab 06/27/16 2057  LIPASE 39   No results for input(s): AMMONIA in the last 168 hours. Coagulation Profile: No results for input(s): INR, PROTIME in the last 168 hours. Cardiac Enzymes:  Recent Labs Lab 06/27/16 2057  TROPONINI <0.03   BNP (last 3 results) No results for input(s): PROBNP in the last 8760 hours. HbA1C: No results for input(s): HGBA1C in the last 72 hours. CBG:  Recent Labs Lab  06/28/16 0830  GLUCAP 127*   Lipid Profile: No results for input(s): CHOL, HDL, LDLCALC, TRIG, CHOLHDL, LDLDIRECT in the last 72 hours. Thyroid Function Tests: No results for input(s): TSH, T4TOTAL, FREET4, T3FREE, THYROIDAB in the last 72 hours. Anemia Panel: No results for input(s): VITAMINB12, FOLATE, FERRITIN, TIBC, IRON, RETICCTPCT in the last 72 hours. Sepsis Labs:  Recent Labs Lab 06/27/16 2159 06/28/16 0414  LATICACIDVEN 1.91* 1.3    No results found for this or any previous visit (from the past 240 hour(s)).       Radiology Studies: Ct Abdomen Pelvis W Contrast  Result Date: 06/27/2016 CLINICAL DATA:  Upper abdominal pain EXAM: CT ABDOMEN AND PELVIS WITH CONTRAST TECHNIQUE: Multidetector CT imaging of the abdomen and pelvis was performed using the standard protocol following bolus administration of intravenous contrast. CONTRAST:  165mL ISOVUE-300 IOPAMIDOL (ISOVUE-300) INJECTION 61% COMPARISON:  CT abdomen pelvis 11/22/2013 FINDINGS: Lower chest: There is a large left diaphragmatic hernia with much of the stomach above the diaphragm. There is bibasilar atelectasis. Right middle lobe nodule has increased in size, now measuring 1.5 cm from previously 1.0 cm. Peripheral right lower lobe nodule measures 4 mm. Hepatobiliary: Unchanged cyst at the inferior tip of the right hepatic lobe. The liver is otherwise normal. Normal gallbladder. Pancreas: Normal pancreatic contours and enhancement. No peripancreatic fluid collection or pancreatic ductal dilatation. Spleen: Normal. Adrenals/Urinary Tract: Normal adrenal glands. There are multiple bilateral renal cysts, the largest of which is located near the left upper pole and measures 2.7 cm. No hydronephrosis or solid renal mass. Stomach/Bowel: The stomach is massively gas distended, with a large portion extending above the left hemidiaphragm do a diaphragmatic defect. There is rectosigmoid diverticulosis without acute inflammation. There  is a large amount of stool throughout the colon with a dilated cecum. Much of the small bowel is gas-filled, but not pathologically dilated. There is no intra-abdominal fluid collection. The appendix is not clearly identified. Vascular/Lymphatic: There is atherosclerotic calcification of the non aneurysmal abdominal aorta. No abdominal or pelvic adenopathy. Reproductive: Status post hysterectomy and bilateral salpingo oophorectomy. Musculoskeletal: There is multilevel severe lumbar facet arthrosis. No lumbar spinal canal stenosis. There is grade 1 anterolisthesis at L3-L4, L4-L5 and L5-S1. Other: There are bilateral fat containing inguinal hernias. The right-sided hernia contains a loculated area of fluid measuring 2.3 x 1.6 cm. IMPRESSION: 1. Large amount of stool throughout the colon, mostly gas-filled small bowel and massively dilated and gas-filled stomach. The findings together suggest a distal obstructive process or dysmotility. The cecum is particularly dilated. 2. Large left-sided diaphragmatic hernia containing a substantial portion of the stomach. 3. Increased size of right middle lobe pulmonary nodule, which now measures 1.5  cm and is concerning for metastatic disease. 4. Aortic atherosclerosis. 5. Multilevel severe lumbar facet arthrosis with associated listhesis. Electronically Signed   By: Ulyses Jarred M.D.   On: 06/27/2016 23:21   Dg Abd Acute W/chest  Result Date: 06/27/2016 CLINICAL DATA:  Acute onset abdominal pain. EXAM: DG ABDOMEN ACUTE W/ 1V CHEST COMPARISON:  CT 06/27/2016, 11/22/2013 FINDINGS: The stomach is herniated above the LEFT hemidiaphragm with associated LEFT lower lobe atelectasis. This a chronic finding. The gas-filled stomach is more distended from comparison exams. Gas distention of the stomach above and below the diaphragm. There is gas throughout the colon and small bowel. No evidence of high-grade obstruction of bowel. No intraperitoneal free air IMPRESSION: 1. No  intraperitoneal free air. 2. No Evidence of bowel obstruction. 3. Gas distended stomach. 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 Electronically Signed   By: Suzy Bouchard M.D.   On: 06/27/2016 23:18   Dg Abd Portable 1v  Result Date: 06/28/2016 CLINICAL DATA:  NG tube placement EXAM: PORTABLE ABDOMEN - 1 VIEW COMPARISON:  CT abdomen pelvis 06/27/2016 Abdominal radiograph 06/27/2016 FINDINGS: The tip of the nasogastric tube overlies the fundus of the gas distended, partially intrathoracic stomach. The side port is in the lower esophagus. The tube should be advanced by at least 7 cm. IMPRESSION: NG tube side port in the lower esophagus. Recommend advancement by 7 cm. Electronically Signed   By: Ulyses Jarred M.D.   On: 06/28/2016 00:32        Scheduled Meds: . bisacodyl  10 mg Rectal Daily  . levothyroxine  37.5 mcg Intravenous Daily  . lip balm  1 application Topical BID   Continuous Infusions: . dextrose 5 % and 0.9% NaCl 75 mL/hr at 06/28/16 0458     LOS: 1 day      Tawni Millers, MD [facility] Pager [phone]  If 7PM-7AM, please contact night-coverage [email or web address] Password [facility] 06/28/2016, 4:15 PM

## 2016-06-28 NOTE — Progress Notes (Signed)
Caregiver is here with patient's home meds. Pharmacy tech has been called. Also will page md to let him know when meds are complete.

## 2016-06-28 NOTE — Progress Notes (Signed)
Dr. Cathlean Sauer paged concerning patient having groin pain. Iv morphine administered previously and patient stated that the medication did not work at all.Paged to see if something else can be done.

## 2016-06-28 NOTE — Progress Notes (Signed)
Pt has a son that lives in [place] named Britiny Defrain. She also has a daughter that is currently in a nursing home/ [facility].  A neighbor named Genelle Gather also check in on the patient. Her number is [phone]. Will get sons number from neighbor when she arrives.

## 2016-06-28 NOTE — ED Notes (Signed)
NG tube removed per EDP instruction. Pt tolerated very well.

## 2016-06-29 ENCOUNTER — Inpatient Hospital Stay (HOSPITAL_COMMUNITY): Payer: Medicare Other

## 2016-06-29 DIAGNOSIS — E039 Hypothyroidism, unspecified: Secondary | ICD-10-CM

## 2016-06-29 LAB — CBC WITH DIFFERENTIAL/PLATELET
Basophils Absolute: 0 10*3/uL (ref 0.0–0.1)
Basophils Relative: 0 %
Eosinophils Absolute: 0.2 10*3/uL (ref 0.0–0.7)
Eosinophils Relative: 2 %
HCT: 36.1 % (ref 36.0–46.0)
Hemoglobin: 12.1 g/dL (ref 12.0–15.0)
Lymphocytes Relative: 5 %
Lymphs Abs: 0.5 10*3/uL — ABNORMAL LOW (ref 0.7–4.0)
MCH: 31.3 pg (ref 26.0–34.0)
MCHC: 33.5 g/dL (ref 30.0–36.0)
MCV: 93.5 fL (ref 78.0–100.0)
Monocytes Absolute: 0.4 10*3/uL (ref 0.1–1.0)
Monocytes Relative: 4 %
Neutro Abs: 8.9 10*3/uL — ABNORMAL HIGH (ref 1.7–7.7)
Neutrophils Relative %: 89 %
Platelets: 206 10*3/uL (ref 150–400)
RBC: 3.86 MIL/uL — ABNORMAL LOW (ref 3.87–5.11)
RDW: 15.1 % (ref 11.5–15.5)
WBC: 9.9 10*3/uL (ref 4.0–10.5)

## 2016-06-29 LAB — BASIC METABOLIC PANEL
Anion gap: 6 (ref 5–15)
BUN: 20 mg/dL (ref 6–20)
CO2: 21 mmol/L — ABNORMAL LOW (ref 22–32)
Calcium: 9.2 mg/dL (ref 8.9–10.3)
Chloride: 114 mmol/L — ABNORMAL HIGH (ref 101–111)
Creatinine, Ser: 0.92 mg/dL (ref 0.44–1.00)
GFR calc Af Amer: >60 mL/min (ref >60)
GFR calc non Af Amer: 53 mL/min — ABNORMAL LOW (ref >60)
Glucose, Bld: 108 mg/dL — ABNORMAL HIGH (ref 65–99)
Potassium: 3.9 mmol/L (ref 3.5–5.1)
Sodium: 141 mmol/L (ref 135–145)

## 2016-06-29 LAB — GLUCOSE, CAPILLARY
Glucose-Capillary: 106 mg/dL — ABNORMAL HIGH (ref 65–99)
Glucose-Capillary: 88 mg/dL (ref 65–99)

## 2016-06-29 MED ORDER — IRBESARTAN 150 MG PO TABS
150.0000 mg | ORAL_TABLET | Freq: Every day | ORAL | Status: DC
  Administered 2016-06-29 – 2016-07-01 (×3): 150 mg via ORAL
  Filled 2016-06-29 (×3): qty 1

## 2016-06-29 NOTE — Progress Notes (Signed)
Nursing Note: BP 214/82 p-68 A; apresoline IV per orders given.Ekua,Rn day shift nurse aware and will f/u as it is shift end and I am leaving.wbb

## 2016-06-29 NOTE — Progress Notes (Signed)
PROGRESS NOTE    Cindy [name]  GYB:[id number] DOB: 1922-04-13 DOA: 06/27/2016 PCP: [name], MD     Brief Narrative:  83 yo female presents with abdominal pain associated with nausea. Acute in presentation, epigastric. On the physical examination patient was found hypertensive with blood pressure systolic 876. Her abdomen distended. CT with large left sided diaphragmatic hernia. Admitted for small bowel obstruction. Unable to do esophagogram due to nausea to contrast.   Assessment & Plan:   Principal Problem:   SBO (small bowel obstruction) Active Problems:   Eventration of left crus of diaphragm   Uncontrolled hypertension   CKD (chronic kidney disease) stage 3, GFR 30-59 ml/min   Gastric outlet obstruction   Constipation, chronic   Acute distention of stomach   1. Suspected bowel obstruction. Clinically improved, unable to perform barium swallow due to nausea to contrast. Clinically improved, abdomen soft and not distended, will do trial of clears and will continue to follow on surgery recommendations. If recurrent or worsening symptoms will need upper endoscopy.  2. Hypertensive urgency. Blood pressure continue to be elevated, continue on as needed IV hydralazine. Will resume irbersartan.   3. T2DM. Will continue glucose cover and monitoring with insulin sliding scale. Capillary glucose 127, 100, 106.   4. Dyslipidemia. Continue statin therapy, per home regimen.   5. Hypothyroid. Continue levothyroxine, per home regimen.     DVT prophylaxis: enoxaparin  Code Status: full  Family Communication: I spoke with patient's friends per her request.  Disposition Plan: Home  Consultants:     Procedures:    Antimicrobials:    Subjective: Patient with no further abdominal pain, no nausea or vomiting,. Unable to drink contrast. No fever or chills.   Objective: Vitals:   06/28/16 2147 06/29/16 0517 06/29/16 0751 06/29/16 1000  BP: (!) 160/67 (!)  178/68 (!) 214/82 (!) 156/78  Pulse: (!) 56 (!) 59 68 80  Resp: 16 16    Temp: 98.2 F (36.8 C) 98.6 F (37 C)    TempSrc: Oral Oral    SpO2: 95% 95%    Weight:      Height:       No intake or output data in the 24 hours ending 06/29/16 1340 Filed Weights   06/27/16 2031 06/28/16 0148  Weight: 47.6 kg (105 lb) 52.2 kg (115 lb 1.3 oz)    Examination:  General exam: not in pain or dyspnea E ENT: mild pallor, oral mucosa dry.   Respiratory system: No wheezing, or rales, no rhonchi. Respiratory effort normal. Cardiovascular system: S1 & S2 heard, RRR. No JVD, murmurs, rubs, gallops or clicks. No pedal edema. Gastrointestinal system: Abdomen is nondistended, soft and nontender. No organomegaly or masses felt. Normal bowel sounds heard. Central nervous system: Alert and oriented. No focal neurological deficits. Extremities: Symmetric 5 x 5 power. Skin: No rashes, lesions or ulcers     Data Reviewed: I have personally reviewed following labs and imaging studies  CBC:  Recent Labs Lab 06/27/16 2057 06/28/16 0414 06/29/16 1245  WBC 6.8 7.7 9.9  NEUTROABS 4.0 6.2 8.9*  HGB 11.8* 11.3* 12.1  HCT 35.6* 33.2* 36.1  MCV 95.2 93.5 93.5  PLT 222 210 811   Basic Metabolic Panel:  Recent Labs Lab 06/27/16 2057 06/28/16 0414 06/29/16 1245  NA 140 139 141  K 3.9 4.0 3.9  CL 112* 109 114*  CO2 20* 23 21*  GLUCOSE 158* 152* 108*  BUN 24* 20 20  CREATININE 1.27* 1.05* 0.92  CALCIUM 9.2  9.1 9.2   GFR: Estimated Creatinine Clearance: 28.6 mL/min (by C-G formula based on SCr of 0.92 mg/dL). Liver Function Tests:  Recent Labs Lab 06/27/16 2057 06/28/16 0414  AST 26 28  ALT 15 21  ALKPHOS 58 51  BILITOT 0.3 0.4  PROT 6.8 6.8  ALBUMIN 3.5 3.4*    Recent Labs Lab 06/27/16 2057  LIPASE 39   No results for input(s): AMMONIA in the last 168 hours. Coagulation Profile: No results for input(s): INR, PROTIME in the last 168 hours. Cardiac Enzymes:  Recent  Labs Lab 06/27/16 2057  TROPONINI <0.03   BNP (last 3 results) No results for input(s): PROBNP in the last 8760 hours. HbA1C: No results for input(s): HGBA1C in the last 72 hours. CBG:  Recent Labs Lab 06/28/16 0830 06/28/16 2220 06/29/16 0700  GLUCAP 127* 100* 106*   Lipid Profile: No results for input(s): CHOL, HDL, LDLCALC, TRIG, CHOLHDL, LDLDIRECT in the last 72 hours. Thyroid Function Tests: No results for input(s): TSH, T4TOTAL, FREET4, T3FREE, THYROIDAB in the last 72 hours. Anemia Panel: No results for input(s): VITAMINB12, FOLATE, FERRITIN, TIBC, IRON, RETICCTPCT in the last 72 hours. Sepsis Labs:  Recent Labs Lab 06/27/16 2159 06/28/16 0414  LATICACIDVEN 1.91* 1.3    No results found for this or any previous visit (from the past 240 hour(s)).       Radiology Studies: Ct Abdomen Pelvis W Contrast  Result Date: 06/27/2016 CLINICAL DATA:  Upper abdominal pain EXAM: CT ABDOMEN AND PELVIS WITH CONTRAST TECHNIQUE: Multidetector CT imaging of the abdomen and pelvis was performed using the standard protocol following bolus administration of intravenous contrast. CONTRAST:  145mL ISOVUE-300 IOPAMIDOL (ISOVUE-300) INJECTION 61% COMPARISON:  CT abdomen pelvis 11/22/2013 FINDINGS: Lower chest: There is a large left diaphragmatic hernia with much of the stomach above the diaphragm. There is bibasilar atelectasis. Right middle lobe nodule has increased in size, now measuring 1.5 cm from previously 1.0 cm. Peripheral right lower lobe nodule measures 4 mm. Hepatobiliary: Unchanged cyst at the inferior tip of the right hepatic lobe. The liver is otherwise normal. Normal gallbladder. Pancreas: Normal pancreatic contours and enhancement. No peripancreatic fluid collection or pancreatic ductal dilatation. Spleen: Normal. Adrenals/Urinary Tract: Normal adrenal glands. There are multiple bilateral renal cysts, the largest of which is located near the left upper pole and measures 2.7 cm.  No hydronephrosis or solid renal mass. Stomach/Bowel: The stomach is massively gas distended, with a large portion extending above the left hemidiaphragm do a diaphragmatic defect. There is rectosigmoid diverticulosis without acute inflammation. There is a large amount of stool throughout the colon with a dilated cecum. Much of the small bowel is gas-filled, but not pathologically dilated. There is no intra-abdominal fluid collection. The appendix is not clearly identified. Vascular/Lymphatic: There is atherosclerotic calcification of the non aneurysmal abdominal aorta. No abdominal or pelvic adenopathy. Reproductive: Status post hysterectomy and bilateral salpingo oophorectomy. Musculoskeletal: There is multilevel severe lumbar facet arthrosis. No lumbar spinal canal stenosis. There is grade 1 anterolisthesis at L3-L4, L4-L5 and L5-S1. Other: There are bilateral fat containing inguinal hernias. The right-sided hernia contains a loculated area of fluid measuring 2.3 x 1.6 cm. IMPRESSION: 1. Large amount of stool throughout the colon, mostly gas-filled small bowel and massively dilated and gas-filled stomach. The findings together suggest a distal obstructive process or dysmotility. The cecum is particularly dilated. 2. Large left-sided diaphragmatic hernia containing a substantial portion of the stomach. 3. Increased size of right middle lobe pulmonary nodule, which now measures 1.5  cm and is concerning for metastatic disease. 4. Aortic atherosclerosis. 5. Multilevel severe lumbar facet arthrosis with associated listhesis. Electronically Signed   By: Ulyses Jarred M.D.   On: 06/27/2016 23:21   Dg Abd Acute W/chest  Result Date: 06/27/2016 CLINICAL DATA:  Acute onset abdominal pain. EXAM: DG ABDOMEN ACUTE W/ 1V CHEST COMPARISON:  CT 06/27/2016, 11/22/2013 FINDINGS: The stomach is herniated above the LEFT hemidiaphragm with associated LEFT lower lobe atelectasis. This a chronic finding. The gas-filled stomach is  more distended from comparison exams. Gas distention of the stomach above and below the diaphragm. There is gas throughout the colon and small bowel. No evidence of high-grade obstruction of bowel. No intraperitoneal free air IMPRESSION: 1. No intraperitoneal free air. 2. No Evidence of bowel obstruction. 3. Gas distended stomach. 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 Electronically Signed   By: Suzy Bouchard M.D.   On: 06/27/2016 23:18   Dg Abd Portable 1v  Result Date: 06/28/2016 CLINICAL DATA:  NG tube placement EXAM: PORTABLE ABDOMEN - 1 VIEW COMPARISON:  CT abdomen pelvis 06/27/2016 Abdominal radiograph 06/27/2016 FINDINGS: The tip of the nasogastric tube overlies the fundus of the gas distended, partially intrathoracic stomach. The side port is in the lower esophagus. The tube should be advanced by at least 7 cm. IMPRESSION: NG tube side port in the lower esophagus. Recommend advancement by 7 cm. Electronically Signed   By: Ulyses Jarred M.D.   On: 06/28/2016 00:32        Scheduled Meds: . bisacodyl  10 mg Rectal Daily  . irbesartan  150 mg Oral Daily  . levothyroxine  37.5 mcg Intravenous Daily  . lip balm  1 application Topical BID   Continuous Infusions:   LOS: 2 days       Mayra Jolliffe Gerome Apley, MD [facility] Pager [phone]  If 7PM-7AM, please contact night-coverage [email or web address] Password [email or web address] 06/29/2016, 1:40 PM

## 2016-06-29 NOTE — Progress Notes (Signed)
Subjective: She seems to feel fine.  Wanting to know if she can have some ice and water.  She went down for the esophogram, but she tells me she declined because she thought she would throw it up right away.    Objective: Vital signs in last 24 hours: Temp:  [98.1 F (36.7 C)-98.6 F (37 C)] 98.6 F (37 C) (03/20 0517) Pulse Rate:  [55-80] 80 (03/20 1000) Resp:  [16] 16 (03/20 0517) BP: (156-214)/(56-82) 156/78 (03/20 1000) SpO2:  [95 %-98 %] 95 % (03/20 0517) Last BM Date: 06/28/16 Nothing PO or IV recorded Urine x 2  Stool x 2 Afebrile, VSS No labs today No film Intake/Output from previous day: No intake/output data recorded. Intake/Output this shift: No intake/output data recorded.  General appearance: alert, cooperative and no distress GI: soft, non-tender; bowel sounds normal; no masses,  no organomegaly  Lab Results:   Recent Labs  06/27/16 2057 06/28/16 0414  WBC 6.8 7.7  HGB 11.8* 11.3*  HCT 35.6* 33.2*  PLT 222 210    BMET  Recent Labs  06/27/16 2057 06/28/16 0414  NA 140 139  K 3.9 4.0  CL 112* 109  CO2 20* 23  GLUCOSE 158* 152*  BUN 24* 20  CREATININE 1.27* 1.05*  CALCIUM 9.2 9.1   PT/INR No results for input(s): LABPROT, INR in the last 72 hours.   Recent Labs Lab 06/27/16 2057 06/28/16 0414  AST 26 28  ALT 15 21  ALKPHOS 58 51  BILITOT 0.3 0.4  PROT 6.8 6.8  ALBUMIN 3.5 3.4*     Lipase     Component Value Date/Time   LIPASE 39 06/27/2016 2057     Studies/Results: Ct Abdomen Pelvis W Contrast  Result Date: 06/27/2016 CLINICAL DATA:  Upper abdominal pain EXAM: CT ABDOMEN AND PELVIS WITH CONTRAST TECHNIQUE: Multidetector CT imaging of the abdomen and pelvis was performed using the standard protocol following bolus administration of intravenous contrast. CONTRAST:  165mL ISOVUE-300 IOPAMIDOL (ISOVUE-300) INJECTION 61% COMPARISON:  CT abdomen pelvis 11/22/2013 FINDINGS: Lower chest: There is a large left diaphragmatic hernia  with much of the stomach above the diaphragm. There is bibasilar atelectasis. Right middle lobe nodule has increased in size, now measuring 1.5 cm from previously 1.0 cm. Peripheral right lower lobe nodule measures 4 mm. Hepatobiliary: Unchanged cyst at the inferior tip of the right hepatic lobe. The liver is otherwise normal. Normal gallbladder. Pancreas: Normal pancreatic contours and enhancement. No peripancreatic fluid collection or pancreatic ductal dilatation. Spleen: Normal. Adrenals/Urinary Tract: Normal adrenal glands. There are multiple bilateral renal cysts, the largest of which is located near the left upper pole and measures 2.7 cm. No hydronephrosis or solid renal mass. Stomach/Bowel: The stomach is massively gas distended, with a large portion extending above the left hemidiaphragm do a diaphragmatic defect. There is rectosigmoid diverticulosis without acute inflammation. There is a large amount of stool throughout the colon with a dilated cecum. Much of the small bowel is gas-filled, but not pathologically dilated. There is no intra-abdominal fluid collection. The appendix is not clearly identified. Vascular/Lymphatic: There is atherosclerotic calcification of the non aneurysmal abdominal aorta. No abdominal or pelvic adenopathy. Reproductive: Status post hysterectomy and bilateral salpingo oophorectomy. Musculoskeletal: There is multilevel severe lumbar facet arthrosis. No lumbar spinal canal stenosis. There is grade 1 anterolisthesis at L3-L4, L4-L5 and L5-S1. Other: There are bilateral fat containing inguinal hernias. The right-sided hernia contains a loculated area of fluid measuring 2.3 x 1.6 cm. IMPRESSION: 1. Large amount  of stool throughout the colon, mostly gas-filled small bowel and massively dilated and gas-filled stomach. The findings together suggest a distal obstructive process or dysmotility. The cecum is particularly dilated. 2. Large left-sided diaphragmatic hernia containing a  substantial portion of the stomach. 3. Increased size of right middle lobe pulmonary nodule, which now measures 1.5 cm and is concerning for metastatic disease. 4. Aortic atherosclerosis. 5. Multilevel severe lumbar facet arthrosis with associated listhesis. Electronically Signed   By: Ulyses Jarred M.D.   On: 06/27/2016 23:21   Dg Abd Acute W/chest  Result Date: 06/27/2016 CLINICAL DATA:  Acute onset abdominal pain. EXAM: DG ABDOMEN ACUTE W/ 1V CHEST COMPARISON:  CT 06/27/2016, 11/22/2013 FINDINGS: The stomach is herniated above the LEFT hemidiaphragm with associated LEFT lower lobe atelectasis. This a chronic finding. The gas-filled stomach is more distended from comparison exams. Gas distention of the stomach above and below the diaphragm. There is gas throughout the colon and small bowel. No evidence of high-grade obstruction of bowel. No intraperitoneal free air IMPRESSION: 1. No intraperitoneal free air. 2. No Evidence of bowel obstruction. 3. Gas distended stomach. 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 Electronically Signed   By: Suzy Bouchard M.D.   On: 06/27/2016 23:18   Dg Abd Portable 1v  Result Date: 06/28/2016 CLINICAL DATA:  NG tube placement EXAM: PORTABLE ABDOMEN - 1 VIEW COMPARISON:  CT abdomen pelvis 06/27/2016 Abdominal radiograph 06/27/2016 FINDINGS: The tip of the nasogastric tube overlies the fundus of the gas distended, partially intrathoracic stomach. The side port is in the lower esophagus. The tube should be advanced by at least 7 cm. IMPRESSION: NG tube side port in the lower esophagus. Recommend advancement by 7 cm. Electronically Signed   By: Ulyses Jarred M.D.   On: 06/28/2016 00:32    Medications: . bisacodyl  10 mg Rectal Daily  . levothyroxine  37.5 mcg Intravenous Daily  . lip balm  1 application Topical BID    NO IV fluids  Assessment/Plan SBO/ ileus vs gastric outlet obstruction Eventration of left crus of  diaphragm  Uncontrolled hypertension Gastric outlet obstruction Chronic renal insuffiencey Hypertension GERD Hx of Malignant melanoma and LML lung nodule Hypothyroid LBBB FEN:  NPO ID: No abx DVT:  SCD   Plan:  We will follow with you.  She tell me her daughter said she had something like this before and she does not remember it.       LOS: 2 days    Percell Lamboy 06/29/2016 [phone]

## 2016-06-30 ENCOUNTER — Inpatient Hospital Stay (HOSPITAL_COMMUNITY): Payer: Medicare Other

## 2016-06-30 DIAGNOSIS — N183 Chronic kidney disease, stage 3 (moderate): Secondary | ICD-10-CM

## 2016-06-30 DIAGNOSIS — I10 Essential (primary) hypertension: Secondary | ICD-10-CM

## 2016-06-30 DIAGNOSIS — K56609 Unspecified intestinal obstruction, unspecified as to partial versus complete obstruction: Secondary | ICD-10-CM

## 2016-06-30 DIAGNOSIS — K311 Adult hypertrophic pyloric stenosis: Principal | ICD-10-CM

## 2016-06-30 LAB — GLUCOSE, CAPILLARY
Glucose-Capillary: 89 mg/dL (ref 65–99)
Glucose-Capillary: 93 mg/dL (ref 65–99)
Glucose-Capillary: 94 mg/dL (ref 65–99)

## 2016-06-30 LAB — CBC WITH DIFFERENTIAL/PLATELET
Basophils Absolute: 0 10*3/uL (ref 0.0–0.1)
Basophils Relative: 0 %
Eosinophils Absolute: 0.8 10*3/uL — ABNORMAL HIGH (ref 0.0–0.7)
Eosinophils Relative: 11 %
HCT: 32.3 % — ABNORMAL LOW (ref 36.0–46.0)
Hemoglobin: 10.7 g/dL — ABNORMAL LOW (ref 12.0–15.0)
Lymphocytes Relative: 16 %
Lymphs Abs: 1.1 10*3/uL (ref 0.7–4.0)
MCH: 30.7 pg (ref 26.0–34.0)
MCHC: 33.1 g/dL (ref 30.0–36.0)
MCV: 92.8 fL (ref 78.0–100.0)
Monocytes Absolute: 0.8 10*3/uL (ref 0.1–1.0)
Monocytes Relative: 11 %
Neutro Abs: 4.4 10*3/uL (ref 1.7–7.7)
Neutrophils Relative %: 62 %
Platelets: 206 10*3/uL (ref 150–400)
RBC: 3.48 MIL/uL — ABNORMAL LOW (ref 3.87–5.11)
RDW: 15.2 % (ref 11.5–15.5)
WBC: 7.1 10*3/uL (ref 4.0–10.5)

## 2016-06-30 LAB — BASIC METABOLIC PANEL
Anion gap: 5 (ref 5–15)
BUN: 19 mg/dL (ref 6–20)
CO2: 23 mmol/L (ref 22–32)
Calcium: 8.9 mg/dL (ref 8.9–10.3)
Chloride: 113 mmol/L — ABNORMAL HIGH (ref 101–111)
Creatinine, Ser: 1.03 mg/dL — ABNORMAL HIGH (ref 0.44–1.00)
GFR calc Af Amer: 53 mL/min — ABNORMAL LOW (ref >60)
GFR calc non Af Amer: 46 mL/min — ABNORMAL LOW (ref >60)
Glucose, Bld: 89 mg/dL (ref 65–99)
Potassium: 3.9 mmol/L (ref 3.5–5.1)
Sodium: 141 mmol/L (ref 135–145)

## 2016-06-30 MED ORDER — DIPHENHYDRAMINE HCL 12.5 MG/5ML PO ELIX
12.5000 mg | ORAL_SOLUTION | Freq: Three times a day (TID) | ORAL | Status: AC | PRN
  Administered 2016-06-30 – 2016-07-01 (×2): 12.5 mg via ORAL
  Filled 2016-06-30 (×2): qty 5

## 2016-06-30 MED ORDER — HYDROCORTISONE 1 % EX CREA
1.0000 "application " | TOPICAL_CREAM | Freq: Three times a day (TID) | CUTANEOUS | Status: DC | PRN
  Filled 2016-06-30: qty 28

## 2016-06-30 MED ORDER — LEVOTHYROXINE SODIUM 50 MCG PO TABS
75.0000 ug | ORAL_TABLET | Freq: Every day | ORAL | Status: DC
  Administered 2016-07-01: 75 ug via ORAL
  Filled 2016-06-30: qty 1

## 2016-06-30 NOTE — Progress Notes (Signed)
  Subjective: Denies abd pain, n/v. Tolerated clears. No burping/ no sensation of liquids sitting in chest  Objective: Vital signs in last 24 hours: Temp:  [97.8 F (36.6 C)-98.6 F (37 C)] 98.6 F (37 C) (03/21 0449) Pulse Rate:  [65-80] 65 (03/21 0449) Resp:  [16-18] 16 (03/21 0449) BP: (135-156)/(57-78) 141/63 (03/21 0449) SpO2:  [98 %-99 %] 98 % (03/21 0449) Last BM Date: 06/29/16  Intake/Output from previous day: 03/20 0701 - 03/21 0700 In: 250 [P.O.:250] Out: -  Intake/Output this shift: Total I/O In: 250 [P.O.:250] Out: -   Resting comfortably, nad Soft, nd, nt  Lab Results:   Recent Labs  06/29/16 1245 06/30/16 0422  WBC 9.9 7.1  HGB 12.1 10.7*  HCT 36.1 32.3*  PLT 206 206   BMET  Recent Labs  06/29/16 1245 06/30/16 0422  NA 141 141  K 3.9 3.9  CL 114* 113*  CO2 21* 23  GLUCOSE 108* 89  BUN 20 19  CREATININE 0.92 1.03*  CALCIUM 9.2 8.9   PT/INR No results for input(s): LABPROT, INR in the last 72 hours. ABG No results for input(s): PHART, HCO3 in the last 72 hours.  Invalid input(s): PCO2, PO2  Studies/Results: No results found.  Anti-infectives: Anti-infectives    None      Assessment/Plan: SBO/ ileus vs gastric outlet obstruction Eventration of left crus of diaphragm Uncontrolled hypertension Gastric outlet obstruction? Chronic renal insuffiencey Hypertension GERD Hx of Malignant melanoma and LML lung nodule Hypothyroid LBBB ID: No abx DVT:  SCD  Pt refused esophogram yesterday. Appears to be tolerating clears. At this point since her pain has resolved and no issues with clears, would gradually advance her diet.   Will adv to full liquids.  If has n/v/abd pain, will need gastrograffin UGI or upper endo to r/o gastric outlet obstruction.   Consider PT/OT consults  Leighton Ruff. Redmond Pulling, MD, FACS General, Bariatric, & Minimally Invasive Surgery [facility], [place]   LOS: 3 days    Cindy [name] 06/30/2016

## 2016-06-30 NOTE — Progress Notes (Signed)
PROGRESS NOTE [facility]   Chenoa Luddy   VQM:[id number] DOB: Feb 26, 1923  DOA: 06/27/2016 PCP: [name], MD   Brief Narrative:  83 y/o F with PMHx of HTN, DM type 2, presented to the ED with severe acute abdominal pain. Patient was found to be hypertensive with SBP up in the 200's. CT abdomen with large left side diaphragmatic hernia and abdominal distention. Admitted for suspected SBO, unable to do oral contrast due to patient contrast intolerance.   Subjective: Patient seen and examined. Have no complaints this am. Patient tolerating clears liquid well. Abdominal pain has resolved. Having regular BM. No acute events overnight   Assessment & Plan: Abdominal pain - suspected SBO vs Gastric outlet obstruction - improved  Pain has resolved w/o intervention. Patient refused esophagram due to intolerance to contrast.  Advance as tolerated, if symptoms worsen or recur will need endoscopy. Surgery recommendations appreciated. If patient tolerates diet well, will d/c in am with follow up with GI as outpatient,  HTN urgency  Blood pressure slight improved  Continue current management   DM type 2 - CBG's stable  SSI  Monitor CBG's   Hypothyroidism - continue home does synthroid   DVT prophylaxis: Lovenox  Code Status: DNR Family Communication: None at bedside  Disposition Plan: Home in 24 hrs if tolerating diet  Consultants:   Gen Surgery   Procedures:   None  Antimicrobials:  None   Objective: Vitals:   06/29/16 1000 06/29/16 1515 06/29/16 2147 06/30/16 0449  BP: (!) 156/78 (!) 135/57 (!) 142/62 (!) 141/63  Pulse: 80 67 72 65  Resp:  18 18 16   Temp:  97.8 F (36.6 C) 98.2 F (36.8 C) 98.6 F (37 C)  TempSrc:  Oral Oral Oral  SpO2:  98% 99% 98%  Weight:      Height:        Intake/Output Summary (Last 24 hours) at 06/30/16 1030 Last data filed at 06/30/16 0842  Gross per 24 hour  Intake              500 ml  Output                0 ml  Net               500 ml   Filed Weights   06/27/16 2031 06/28/16 0148  Weight: 47.6 kg (105 lb) 52.2 kg (115 lb 1.3 oz)    Examination:  General exam: Appears calm and comfortable  HEENT: AC/AT, PERRLA, OP moist and clear Respiratory system: Clear to auscultation. No wheezes,crackle or rhonchi Cardiovascular system: S1 & S2 heard, RRR. No JVD, murmurs, rubs or gallops Gastrointestinal system: Abdomen is nondistended, soft and nontender. Central nervous system: Alert and oriented.  Extremities: No pedal edema.  Skin: No rashes, lesions or ulcers Psychiatry: Mood & affect appropriate.    Data Reviewed: I have personally reviewed following labs and imaging studies  CBC:  Recent Labs Lab 06/27/16 2057 06/28/16 0414 06/29/16 1245 06/30/16 0422  WBC 6.8 7.7 9.9 7.1  NEUTROABS 4.0 6.2 8.9* 4.4  HGB 11.8* 11.3* 12.1 10.7*  HCT 35.6* 33.2* 36.1 32.3*  MCV 95.2 93.5 93.5 92.8  PLT 222 210 206 932   Basic Metabolic Panel:  Recent Labs Lab 06/27/16 2057 06/28/16 0414 06/29/16 1245 06/30/16 0422  NA 140 139 141 141  K 3.9 4.0 3.9 3.9  CL 112* 109 114* 113*  CO2 20* 23 21* 23  GLUCOSE 158* 152* 108* 89  BUN 24* 20 20 19   CREATININE 1.27* 1.05* 0.92 1.03*  CALCIUM 9.2 9.1 9.2 8.9   GFR: Estimated Creatinine Clearance: 25.6 mL/min (A) (by C-G formula based on SCr of 1.03 mg/dL (H)). Liver Function Tests:  Recent Labs Lab 06/27/16 2057 06/28/16 0414  AST 26 28  ALT 15 21  ALKPHOS 58 51  BILITOT 0.3 0.4  PROT 6.8 6.8  ALBUMIN 3.5 3.4*    Recent Labs Lab 06/27/16 2057  LIPASE 39   No results for input(s): AMMONIA in the last 168 hours. Coagulation Profile: No results for input(s): INR, PROTIME in the last 168 hours. Cardiac Enzymes:  Recent Labs Lab 06/27/16 2057  TROPONINI <0.03   BNP (last 3 results) No results for input(s): PROBNP in the last 8760 hours. HbA1C: No results for input(s): HGBA1C in the last 72 hours. CBG:  Recent Labs Lab  06/28/16 0830 06/28/16 2220 06/29/16 0700 06/29/16 2248 06/30/16 0556  GLUCAP 127* 100* 106* 88 89   Lipid Profile: No results for input(s): CHOL, HDL, LDLCALC, TRIG, CHOLHDL, LDLDIRECT in the last 72 hours. Thyroid Function Tests: No results for input(s): TSH, T4TOTAL, FREET4, T3FREE, THYROIDAB in the last 72 hours. Anemia Panel: No results for input(s): VITAMINB12, FOLATE, FERRITIN, TIBC, IRON, RETICCTPCT in the last 72 hours. Sepsis Labs:  Recent Labs Lab 06/27/16 2159 06/28/16 0414  LATICACIDVEN 1.91* 1.3    No results found for this or any previous visit (from the past 240 hour(s)).    Radiology Studies: No results found.    Scheduled Meds: . bisacodyl  10 mg Rectal Daily  . irbesartan  150 mg Oral Daily  . levothyroxine  37.5 mcg Intravenous Daily  . lip balm  1 application Topical BID   Continuous Infusions:   LOS: 3 days    Chipper Oman, MD Pager: Text Page via [email or web address]  [phone]  If 7PM-7AM, please contact night-coverage [email or web address] Password [email or web address] 06/30/2016, 10:30 AM

## 2016-06-30 NOTE — Progress Notes (Signed)
Key Points: Use following P&T approved IV to PO non-antibiotic change policy.  Description contains the criteria that are approved Note: Policy Excludes:  Esophagectomy patientsPHARMACIST - PHYSICIAN COMMUNICATION DR:   CCS CONCERNING: IV to Oral Route Change Policy  RECOMMENDATION: This patient is receiving Synthroid by the intravenous route.  Based on criteria approved by the Pharmacy and Therapeutics Committee, the intravenous medication(s) is/are being converted to the equivalent oral dose form(s).   DESCRIPTION: These criteria include:  The patient is eating (either orally or via tube) and/or has been taking other orally administered medications for a least 24 hours  The patient has no evidence of active gastrointestinal bleeding or impaired GI absorption (gastrectomy, short bowel, patient on TNA or NPO).  If you have questions about this conversion, please contact the Pharmacy Department  []   [phone] )  [facility] []   [phone] )  [facility]  []   [phone] )  [facility] [x]   [phone] )  [place], [place], [facility] 06/30/2016 10:34 AM

## 2016-07-01 DIAGNOSIS — E119 Type 2 diabetes mellitus without complications: Secondary | ICD-10-CM

## 2016-07-01 DIAGNOSIS — R1010 Upper abdominal pain, unspecified: Secondary | ICD-10-CM

## 2016-07-01 LAB — CBC WITH DIFFERENTIAL/PLATELET
Basophils Absolute: 0 10*3/uL (ref 0.0–0.1)
Basophils Relative: 0 %
Eosinophils Absolute: 0.7 10*3/uL (ref 0.0–0.7)
Eosinophils Relative: 10 %
HCT: 33.6 % — ABNORMAL LOW (ref 36.0–46.0)
Hemoglobin: 11.3 g/dL — ABNORMAL LOW (ref 12.0–15.0)
Lymphocytes Relative: 23 %
Lymphs Abs: 1.7 10*3/uL (ref 0.7–4.0)
MCH: 30.9 pg (ref 26.0–34.0)
MCHC: 33.6 g/dL (ref 30.0–36.0)
MCV: 91.8 fL (ref 78.0–100.0)
Monocytes Absolute: 0.7 10*3/uL (ref 0.1–1.0)
Monocytes Relative: 10 %
Neutro Abs: 4.3 10*3/uL (ref 1.7–7.7)
Neutrophils Relative %: 57 %
Platelets: 206 10*3/uL (ref 150–400)
RBC: 3.66 MIL/uL — ABNORMAL LOW (ref 3.87–5.11)
RDW: 14.6 % (ref 11.5–15.5)
WBC: 7.4 10*3/uL (ref 4.0–10.5)

## 2016-07-01 LAB — COMPREHENSIVE METABOLIC PANEL
ALT: 16 U/L (ref 14–54)
AST: 22 U/L (ref 15–41)
Albumin: 2.9 g/dL — ABNORMAL LOW (ref 3.5–5.0)
Alkaline Phosphatase: 50 U/L (ref 38–126)
Anion gap: 9 (ref 5–15)
BUN: 14 mg/dL (ref 6–20)
CO2: 21 mmol/L — ABNORMAL LOW (ref 22–32)
Calcium: 9.2 mg/dL (ref 8.9–10.3)
Chloride: 109 mmol/L (ref 101–111)
Creatinine, Ser: 0.9 mg/dL (ref 0.44–1.00)
GFR calc Af Amer: >60 mL/min (ref >60)
GFR calc non Af Amer: 54 mL/min — ABNORMAL LOW (ref >60)
Glucose, Bld: 87 mg/dL (ref 65–99)
Potassium: 3.7 mmol/L (ref 3.5–5.1)
Sodium: 139 mmol/L (ref 135–145)
Total Bilirubin: 0.8 mg/dL (ref 0.3–1.2)
Total Protein: 6 g/dL — ABNORMAL LOW (ref 6.5–8.1)

## 2016-07-01 LAB — GLUCOSE, CAPILLARY: Glucose-Capillary: 87 mg/dL (ref 65–99)

## 2016-07-01 MED ORDER — LEVOTHYROXINE SODIUM 75 MCG PO TABS: 75.0000 ug | ORAL_TABLET | Freq: Every day | ORAL | Status: DC

## 2016-07-01 MED ORDER — FUROSEMIDE 20 MG PO TABS
20.0000 mg | ORAL_TABLET | Freq: Every day | ORAL | Status: DC
  Administered 2016-07-01: 20 mg via ORAL
  Filled 2016-07-01: qty 1

## 2016-07-01 NOTE — Care Management Note (Signed)
Case Management Note  Patient Details  Name: Cindy [name] MRN: [id number] Date of Birth: 06-01-1922  Subjective/Objective:         83 yo admitted with SBO.           Action/Plan: From home alone. Pt was independent with her ADLs prior to admission. Per nursing staff, pt is walking independently while in the hospital. No CM needs identified or communicated.  Expected Discharge Date:   (unknown)               Expected Discharge Plan:  Home/Self Care  In-House Referral:     Discharge planning Services  CM Consult  Post Acute Care Choice:    Choice offered to:     DME Arranged:    DME Agency:     HH Arranged:    [place] Agency:     Status of Service:  Completed, signed off  If discussed at [employer] of Stay Meetings, dates discussed:    Additional CommentsLynnell Catalan, RN 07/01/2016, 10:35 AM  [phone]

## 2016-07-01 NOTE — Discharge Summary (Addendum)
Physician Discharge Summary  Cindy [name]  YIR:[id number]  DOB: [birth year]  DOA: 06/27/2016 PCP: [name], MD  Admit date: 06/27/2016 Discharge date: 07/01/2016  Admitted From: Home  Disposition:  Home   Recommendations for Outpatient Follow-up:  1. Follow up with PCP in 1-2 weeks 2. Please obtain BMP/CBC in one week  Discharge Condition: Stable  CODE STATUS: DNR Diet recommendation: Heart Healthy/Soft   Brief/Interim Summary: 83 y/o F with PMHx of HTN, DM type 2, presented to the ED with severe acute abdominal pain. Patient was found to be hypertensive with SBP up in the 200's. CT abdomen with large left side diaphragmatic hernia and abdominal distention. Admitted for suspected SBO, unable to do oral contrast due to patient contrast intolerance. Patient was treated conservatively, placed on NPO and observe. Patient subsequently improved and tolerated diet well. Patient abdominal pain improved and patient is tolerating full diet. Patient was evaluated by surgery which recommended conservative treatment. BP was treated with hydralazine and patients home medications. BP subsequently improved. Patient clinically better, will discharge home with PCP.  Subjective: Patient seen and examined. Having breakfast. Denies abdominal pain and nausea. Remains afebrile, tolerated diet and ambulating well.  Discharge Diagnoses/Hospital Course:  Abdominal pain - suspected SBO vs Gastric outlet obstruction - Clinically undetermined. Pain has resolved w/o intervention. Patient refused esophagram due to intolerance to contrast.  Diest was advance as tolerated. Recommended to follow up with PCP for further workup if deemed necessary.  HTN urgency - resolved  BP stable  Continue home medication  Diovan and Losartan  Follow up with PCP   DM type 2 - CBG's stable during hospital stay  Diet controlled   Monitor finger glucose goal < 140 fasting   Hypothyroidism - continue home does synthroid    All other chronic medical condition were stable during the hospitalization.  On the day of the discharge the patient's vitals were stable, and no other acute medical condition were reported by patient. Patient was felt safe to be discharge to home   Discharge Instructions  You were cared for by a hospitalist during your hospital stay. If you have any questions about your discharge medications or the care you received while you were in the hospital after you are discharged, you can call the unit and asked to speak with the hospitalist on call if the hospitalist that took care of you is not available. Once you are discharged, your primary care physician will handle any further medical issues. Please note that NO REFILLS for any discharge medications will be authorized once you are discharged, as it is imperative that you return to your primary care physician (or establish a relationship with a primary care physician if you do not have one) for your aftercare needs so that they can reassess your need for medications and monitor your lab values.  Discharge Instructions    Call MD for:  difficulty breathing, headache or visual disturbances    Complete by:  As directed    Call MD for:  extreme fatigue    Complete by:  As directed    Call MD for:  hives    Complete by:  As directed    Call MD for:  persistant dizziness or light-headedness    Complete by:  As directed    Call MD for:  persistant nausea and vomiting    Complete by:  As directed    Call MD for:  redness, tenderness, or signs of infection (pain, swelling, redness, odor or green/yellow  discharge around incision site)    Complete by:  As directed    Call MD for:  severe uncontrolled pain    Complete by:  As directed    Call MD for:  temperature >100.4    Complete by:  As directed    Diet - low sodium heart healthy    Complete by:  As directed    Increase activity slowly    Complete by:  As directed      Allergies as of 07/01/2016       Reactions   Fish Allergy Hives   Sulfa Antibiotics Other (See Comments)   Reaction unknown      Medication List    TAKE these medications   aspirin 81 MG tablet Take 81 mg by mouth daily.   calcium carbonate 1250 (500 Ca) MG tablet Commonly known as:  OS-CAL - dosed in mg of elemental calcium Take 2 tablets by mouth 2 (two) times daily with a meal.   CRANBERRY PO Take 1 tablet by mouth 3 (three) times daily.   furosemide 20 MG tablet Commonly known as:  LASIX Take 20 mg by mouth daily.   ICAPS MV PO Take 1 capsule by mouth daily.   ipratropium 0.06 % nasal spray Commonly known as:  ATROVENT Place 1-2 sprays into both nostrils every 6 (six) hours as needed (rhinorrhea).   levothyroxine 75 MCG tablet Commonly known as:  SYNTHROID, LEVOTHROID Take 1 tablet (75 mcg total) by mouth daily before breakfast. What changed:  medication strength  how much to take   loratadine 10 MG tablet Commonly known as:  CLARITIN Take 10 mg by mouth daily.   methocarbamol 500 MG tablet Commonly known as:  ROBAXIN Take 500 mg by mouth every 6 (six) hours as needed for muscle spasms.   multivitamin capsule Take 1 capsule by mouth daily.   omeprazole 20 MG capsule Commonly known as:  PRILOSEC Take 20 mg by mouth daily.   OVER THE COUNTER MEDICATION Take 1 tablet by mouth 2 (two) times daily. Prescription eye vitamins that come in the mail.   simvastatin 40 MG tablet Commonly known as:  ZOCOR Take 40 mg by mouth every evening.   valsartan 160 MG tablet Commonly known as:  DIOVAN Take 160 mg by mouth daily.      Follow-up Information    ALTHEIMER,MICHAEL D, MD. Schedule an appointment as soon as possible for a visit in 1 week(s).   Specialty:  Endocrinology Contact information: [place] [address] [phone]          Allergies  Allergen Reactions  . Fish Allergy Hives  . Sulfa Antibiotics Other (See Comments)    Reaction unknown     Consultations:  Gen Surgery - Dr Redmond Pulling    Procedures/Studies: Ct Abdomen Pelvis W Contrast  Result Date: 06/27/2016 CLINICAL DATA:  Upper abdominal pain EXAM: CT ABDOMEN AND PELVIS WITH CONTRAST TECHNIQUE: Multidetector CT imaging of the abdomen and pelvis was performed using the standard protocol following bolus administration of intravenous contrast. CONTRAST:  166mL ISOVUE-300 IOPAMIDOL (ISOVUE-300) INJECTION 61% COMPARISON:  CT abdomen pelvis 11/22/2013 FINDINGS: Lower chest: There is a large left diaphragmatic hernia with much of the stomach above the diaphragm. There is bibasilar atelectasis. Right middle lobe nodule has increased in size, now measuring 1.5 cm from previously 1.0 cm. Peripheral right lower lobe nodule measures 4 mm. Hepatobiliary: Unchanged cyst at the inferior tip of the right hepatic lobe. The liver is otherwise normal.  Normal gallbladder. Pancreas: Normal pancreatic contours and enhancement. No peripancreatic fluid collection or pancreatic ductal dilatation. Spleen: Normal. Adrenals/Urinary Tract: Normal adrenal glands. There are multiple bilateral renal cysts, the largest of which is located near the left upper pole and measures 2.7 cm. No hydronephrosis or solid renal mass. Stomach/Bowel: The stomach is massively gas distended, with a large portion extending above the left hemidiaphragm do a diaphragmatic defect. There is rectosigmoid diverticulosis without acute inflammation. There is a large amount of stool throughout the colon with a dilated cecum. Much of the small bowel is gas-filled, but not pathologically dilated. There is no intra-abdominal fluid collection. The appendix is not clearly identified. Vascular/Lymphatic: There is atherosclerotic calcification of the non aneurysmal abdominal aorta. No abdominal or pelvic adenopathy. Reproductive: Status post hysterectomy and bilateral salpingo oophorectomy. Musculoskeletal: There is multilevel severe lumbar facet  arthrosis. No lumbar spinal canal stenosis. There is grade 1 anterolisthesis at L3-L4, L4-L5 and L5-S1. Other: There are bilateral fat containing inguinal hernias. The right-sided hernia contains a loculated area of fluid measuring 2.3 x 1.6 cm. IMPRESSION: 1. Large amount of stool throughout the colon, mostly gas-filled small bowel and massively dilated and gas-filled stomach. The findings together suggest a distal obstructive process or dysmotility. The cecum is particularly dilated. 2. Large left-sided diaphragmatic hernia containing a substantial portion of the stomach. 3. Increased size of right middle lobe pulmonary nodule, which now measures 1.5 cm and is concerning for metastatic disease. 4. Aortic atherosclerosis. 5. Multilevel severe lumbar facet arthrosis with associated listhesis. Electronically Signed   By: Ulyses Jarred M.D.   On: 06/27/2016 23:21   Dg Abd Acute W/chest  Result Date: 06/27/2016 CLINICAL DATA:  Acute onset abdominal pain. EXAM: DG ABDOMEN ACUTE W/ 1V CHEST COMPARISON:  CT 06/27/2016, 11/22/2013 FINDINGS: The stomach is herniated above the LEFT hemidiaphragm with associated LEFT lower lobe atelectasis. This a chronic finding. The gas-filled stomach is more distended from comparison exams. Gas distention of the stomach above and below the diaphragm. There is gas throughout the colon and small bowel. No evidence of high-grade obstruction of bowel. No intraperitoneal free air IMPRESSION: 1. No intraperitoneal free air. 2. No Evidence of bowel obstruction. 3. Gas distended stomach. 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 Electronically Signed   By: Suzy Bouchard M.D.   On: 06/27/2016 23:18   Dg Abd Portable 1v  Result Date: 06/28/2016 CLINICAL DATA:  NG tube placement EXAM: PORTABLE ABDOMEN - 1 VIEW COMPARISON:  CT abdomen pelvis 06/27/2016 Abdominal radiograph 06/27/2016 FINDINGS: The tip of the nasogastric tube overlies the fundus of the  gas distended, partially intrathoracic stomach. The side port is in the lower esophagus. The tube should be advanced by at least 7 cm. IMPRESSION: NG tube side port in the lower esophagus. Recommend advancement by 7 cm. Electronically Signed   By: Ulyses Jarred M.D.   On: 06/28/2016 00:32    Discharge Exam: Vitals:   07/01/16 0500 07/01/16 1252  BP: (!) 164/75 (!) 143/61  Pulse: 69 63  Resp: 16 18  Temp: 98.1 F (36.7 C)    Vitals:   06/30/16 1505 06/30/16 1956 07/01/16 0500 07/01/16 1252  BP: (!) 185/84 (!) 206/91 (!) 164/75 (!) 143/61  Pulse: (!) 54 69 69 63  Resp: 18  16 18   Temp: 98 F (36.7 C) 98 F (36.7 C) 98.1 F (36.7 C)   TempSrc: Oral Oral Oral   SpO2: 95% 92% 94% 97%  Weight:  Height:        General: Pt is alert, awake, not in acute distress Cardiovascular: RRR, S1/S2 +, no rubs, no gallops Respiratory: CTA bilaterally, no wheezing, no rhonchi Abdominal: Soft, NT, ND, bowel sounds + Extremities: no edema, no cyanosis   The results of significant diagnostics from this hospitalization (including imaging, microbiology, ancillary and laboratory) are listed below for reference.     Microbiology: No results found for this or any previous visit (from the past 240 hour(s)).   Labs: BNP (last 3 results) No results for input(s): BNP in the last 8760 hours. Basic Metabolic Panel:  Recent Labs Lab 06/27/16 2057 06/28/16 0414 06/29/16 1245 06/30/16 0422 07/01/16 0429  NA 140 139 141 141 139  K 3.9 4.0 3.9 3.9 3.7  CL 112* 109 114* 113* 109  CO2 20* 23 21* 23 21*  GLUCOSE 158* 152* 108* 89 87  BUN 24* 20 20 19 14   CREATININE 1.27* 1.05* 0.92 1.03* 0.90  CALCIUM 9.2 9.1 9.2 8.9 9.2   Liver Function Tests:  Recent Labs Lab 06/27/16 2057 06/28/16 0414 07/01/16 0429  AST 26 28 22   ALT 15 21 16   ALKPHOS 58 51 50  BILITOT 0.3 0.4 0.8  PROT 6.8 6.8 6.0*  ALBUMIN 3.5 3.4* 2.9*    Recent Labs Lab 06/27/16 2057  LIPASE 39   No results for  input(s): AMMONIA in the last 168 hours. CBC:  Recent Labs Lab 06/27/16 2057 06/28/16 0414 06/29/16 1245 06/30/16 0422 07/01/16 0429  WBC 6.8 7.7 9.9 7.1 7.4  NEUTROABS 4.0 6.2 8.9* 4.4 4.3  HGB 11.8* 11.3* 12.1 10.7* 11.3*  HCT 35.6* 33.2* 36.1 32.3* 33.6*  MCV 95.2 93.5 93.5 92.8 91.8  PLT 222 210 206 206 206   Cardiac Enzymes:  Recent Labs Lab 06/27/16 2057  TROPONINI <0.03   BNP: Invalid input(s): POCBNP CBG:  Recent Labs Lab 06/29/16 2248 06/30/16 0556 06/30/16 1550 06/30/16 2334 07/01/16 0758  GLUCAP 88 89 93 94 87   D-Dimer No results for input(s): DDIMER in the last 72 hours. Hgb A1c No results for input(s): HGBA1C in the last 72 hours. Lipid Profile No results for input(s): CHOL, HDL, LDLCALC, TRIG, CHOLHDL, LDLDIRECT in the last 72 hours. Thyroid function studies No results for input(s): TSH, T4TOTAL, T3FREE, THYROIDAB in the last 72 hours.  Invalid input(s): FREET3 Anemia work up No results for input(s): VITAMINB12, FOLATE, FERRITIN, TIBC, IRON, RETICCTPCT in the last 72 hours. Urinalysis    Component Value Date/Time   COLORURINE YELLOW 11/22/2013 2218   APPEARANCEUR CLOUDY (A) 11/22/2013 2218   LABSPEC 1.019 11/22/2013 2218   PHURINE 5.0 11/22/2013 2218   GLUCOSEU NEGATIVE 11/22/2013 2218   HGBUR LARGE (A) 11/22/2013 2218   BILIRUBINUR NEGATIVE 11/22/2013 2218   KETONESUR NEGATIVE 11/22/2013 2218   PROTEINUR NEGATIVE 11/22/2013 2218   UROBILINOGEN 0.2 11/22/2013 2218   NITRITE NEGATIVE 11/22/2013 2218   LEUKOCYTESUR MODERATE (A) 11/22/2013 2218   Sepsis Labs Invalid input(s): PROCALCITONIN,  WBC,  LACTICIDVEN Microbiology No results found for this or any previous visit (from the past 240 hour(s)).   Time coordinating discharge: 36 minutes  SIGNED:  Chipper Oman, MD  [facility] 07/01/2016, 1:56 PM  Pager please text page via  [email or web address] Password [email or web address]

## 2016-07-01 NOTE — Care Management Important Message (Signed)
Important Message  Patient Details  Name: Oralee Rapaport MRN: [id number] Date of Birth: 1922-06-14   Medicare Important Message Given:  Yes    Kerin Salen 07/01/2016, 11:26 [place] Message  Patient Details  Name: Autumm Hattery MRN: [id number] Date of Birth: 1922/12/01   Medicare Important Message Given:  Yes    Kerin Salen 07/01/2016, 11:26 AM

## 2016-07-01 NOTE — Final Consult Note (Signed)
Consultant Final Sign-Off Note    Assessment/Final recommendations  Cindy [name] is a 83 y.o. female followed by me for abdominal pain   Wound care (if applicable):    Diet at discharge: soft diet   Activity at discharge: per primary team   Follow-up appointment:  Does not need f/u with [facility]; recommend outpt GI followup   Pending results:  Unresulted Labs    None       Medication recommendations:   Other recommendations:    Thank you for allowing [place] to participate in the care of your patient!  Please consult [place] again if you have further needs for your patient.  Greer Pickerel M 07/01/2016 10:05 AM    Subjective   No complaints. No n/v/abd pain. +flatus  Objective  Vital signs in last 24 hours: Temp:  [98 F (36.7 C)-98.1 F (36.7 C)] 98.1 F (36.7 C) (03/22 0500) Pulse Rate:  [54-69] 69 (03/22 0500) Resp:  [16-18] 16 (03/22 0500) BP: (164-206)/(75-91) 164/75 (03/22 0500) SpO2:  [92 %-95 %] 94 % (03/22 0500)  General: Asleep, easily awaken approp Soft, nd, nt   Pertinent labs and Studies:  Recent Labs  06/29/16 1245 06/30/16 0422 07/01/16 0429  WBC 9.9 7.1 7.4  HGB 12.1 10.7* 11.3*  HCT 36.1 32.3* 33.6*   BMET  Recent Labs  06/30/16 0422 07/01/16 0429  NA 141 139  K 3.9 3.7  CL 113* 109  CO2 23 21*  GLUCOSE 89 87  BUN 19 14  CREATININE 1.03* 0.90  CALCIUM 8.9 9.2   No results for input(s): LABURIN in the last 72 hours. Results for orders placed or performed during the hospital encounter of 11/22/13  Urine culture     Status: None   Collection Time: 11/22/13 10:18 PM  Result Value Ref Range Status   Specimen Description URINE, CLEAN CATCH  Final   Special Requests NONE  Final   Culture  Setup Time   Final    11/23/2013 00:02 Performed at [place] Performed at [facility]  Final   Culture NO GROWTH Performed at [facility]  Final   Report Status 11/24/2013 FINAL  Final     Imaging: No results found.

## 2016-09-02 ENCOUNTER — Emergency Department (HOSPITAL_COMMUNITY)
Admission: EM | Admit: 2016-09-02 | Discharge: 2016-09-02 | Disposition: A | Discharge status: Home / Self Care | Payer: Medicare Other | Attending: Emergency Medicine | Admitting: Emergency Medicine

## 2016-09-02 ENCOUNTER — Emergency Department (HOSPITAL_COMMUNITY): Payer: Medicare Other

## 2016-09-02 ENCOUNTER — Encounter (HOSPITAL_COMMUNITY): Payer: Self-pay

## 2016-09-02 DIAGNOSIS — Z7982 Long term (current) use of aspirin: Secondary | ICD-10-CM | POA: Diagnosis not present

## 2016-09-02 DIAGNOSIS — Z79899 Other long term (current) drug therapy: Secondary | ICD-10-CM | POA: Diagnosis not present

## 2016-09-02 DIAGNOSIS — M25561 Pain in right knee: Secondary | ICD-10-CM | POA: Diagnosis present

## 2016-09-02 DIAGNOSIS — Z85828 Personal history of other malignant neoplasm of skin: Secondary | ICD-10-CM | POA: Insufficient documentation

## 2016-09-02 DIAGNOSIS — I129 Hypertensive chronic kidney disease with stage 1 through stage 4 chronic kidney disease, or unspecified chronic kidney disease: Secondary | ICD-10-CM | POA: Diagnosis not present

## 2016-09-02 DIAGNOSIS — E039 Hypothyroidism, unspecified: Secondary | ICD-10-CM | POA: Insufficient documentation

## 2016-09-02 DIAGNOSIS — N183 Chronic kidney disease, stage 3 (moderate): Secondary | ICD-10-CM | POA: Diagnosis not present

## 2016-09-02 MED ORDER — IBUPROFEN 400 MG PO TABS
600.0000 mg | ORAL_TABLET | Freq: Once | ORAL | Status: AC
  Administered 2016-09-02: 600 mg via ORAL
  Filled 2016-09-02: qty 1

## 2016-09-02 MED ORDER — ACETAMINOPHEN 500 MG PO TABS
1000.0000 mg | ORAL_TABLET | Freq: Once | ORAL | Status: AC
  Administered 2016-09-02: 1000 mg via ORAL
  Filled 2016-09-02: qty 2

## 2016-09-02 NOTE — ED Triage Notes (Signed)
Pt sates she began having right knee pain on Sunday that became unbearable yesterday and is not able to bear any weight at this time. Pt denies any injury that she knows of. Pt has knee brace on and moderate amount of swelling present

## 2016-09-02 NOTE — ED Provider Notes (Signed)
[facility] DEPT Provider Note   CSN: [id number] Arrival date & time: 09/02/16  1252     History   Chief Complaint Chief Complaint  Patient presents with  . Knee Pain    HPI Cindy [name] is a 83 y.o. female.  Patient reports that she's had 4 days of right knee pain. Denies any traumatic injury. She does have a history of DJD. Denies any systemic symptoms such as fevers or chills. Pain is primarily when she is ambulating, not at rest. No history of septic arthritis or gout. No history of prosthetic joint.   The history is provided by the patient.  Illness  This is a new problem. Episode onset: 4 days. The problem occurs constantly. The problem has not changed since onset.The symptoms are relieved by NSAIDs and acetaminophen. The treatment provided mild relief.    Past Medical History:  Diagnosis Date  . Chronic renal insufficiency, stage III (moderate)   . Dyslipidemia   . GERD (gastroesophageal reflux disease)   . Hiatal hernia   . History of nuclear stress test 2005   persantine; inferoapical ischmeia with EF 83%  . Hypertension   . Hypothyroidism   . LBBB (left bundle branch block)   . Lung nodule 07/01/03   right middle lobe  . Macular degeneration   . Malignant melanoma ([place]) 11/12/04   L upper arm  . Murmur, cardiac   . Osteoporosis     Patient Active Problem List   Diagnosis Date Noted  . SBO (small bowel obstruction) ([place]) 06/28/2016  . Constipation, chronic 06/28/2016  . Acute distention of stomach 06/28/2016  . Eventration of left crus of diaphragm 06/27/2016  . Uncontrolled hypertension 06/27/2016  . Gastric outlet obstruction 06/27/2016  . CKD (chronic kidney disease) stage 3, GFR 30-59 ml/min 06/14/2016  . Primary hypothyroidism 06/14/2016  . Osteoporosis, postmenopausal 06/14/2016  . OAB (overactive bladder) 02/25/2016  . Left bundle branch block 12/14/2012  . Essential hypertension 12/14/2012  . Hyperlipidemia 12/14/2012  . Pain in shoulder  11/11/2011  . Melanoma of left upper arm ([place]) 10/19/2011  . Lung mass 06/16/2011  . Acquired deformities of toe 06/14/2011  . Personal history of other diseases of the digestive system 06/07/2011  . GERD (gastroesophageal reflux disease) 06/07/2011    Past Surgical History:  Procedure Laterality Date  . BLADDER REPAIR    . CARDIAC CATHETERIZATION  2005/2007   normal coronaries   . CATARACT EXTRACTION     bilat  . coccyx  1951   excision  . knee     right cartilage repair  . ROTATOR CUFF REPAIR     right  . TOTAL ABDOMINAL HYSTERECTOMY W/ BILATERAL SALPINGOOPHORECTOMY    . TRANSTHORACIC ECHOCARDIOGRAM  2007   EF=>55%; borderline conc LVH; LA mod dilated; RA mild-mod dilated; mild TR; mild AVR with mildly sclerotic AV leavlefts; mild PV regurg     OB History    No data available       Home Medications    Prior to Admission medications   Medication Sig Start Date End Date Taking? Authorizing Provider  aspirin 81 MG tablet Take 81 mg by mouth daily.    [provider]  calcium carbonate (OS-CAL - DOSED IN MG OF ELEMENTAL CALCIUM) 1250 (500 Ca) MG tablet Take 2 tablets by mouth 2 (two) times daily with a meal.    [provider]  CRANBERRY PO Take 1 tablet by mouth 3 (three) times daily.    [provider]  furosemide (  LASIX) 20 MG tablet Take 20 mg by mouth daily.    [provider]  ipratropium (ATROVENT) 0.06 % nasal spray Place 1-2 sprays into both nostrils every 6 (six) hours as needed (rhinorrhea).    [provider]  levothyroxine (SYNTHROID, LEVOTHROID) 75 MCG tablet Take 1 tablet (75 mcg total) by mouth daily before breakfast. 07/01/16   Patrecia Pour, Christean Grief, MD  loratadine (CLARITIN) 10 MG tablet Take 10 mg by mouth daily.    [provider]  methocarbamol (ROBAXIN) 500 MG tablet Take 500 mg by mouth every 6 (six) hours as needed for muscle spasms.    [provider]  Multiple Vitamin (MULTIVITAMIN) capsule  Take 1 capsule by mouth daily.    [provider]  Multiple Vitamins-Minerals (ICAPS MV PO) Take 1 capsule by mouth daily.     [provider]  omeprazole (PRILOSEC) 20 MG capsule Take 20 mg by mouth daily.    [provider]  OVER THE COUNTER MEDICATION Take 1 tablet by mouth 2 (two) times daily. Prescription eye vitamins that come in the mail.    [provider]  simvastatin (ZOCOR) 40 MG tablet Take 40 mg by mouth every evening.     [provider]  valsartan (DIOVAN) 160 MG tablet Take 160 mg by mouth daily.    [provider]    Family History Family History  Problem Relation Age of Onset  . Cancer Father        NHL  . Cancer Maternal Grandmother        rectal  . Cancer Daughter        breast  . Cancer Maternal Aunt        breast  . Cancer Son        melanoma    Social History Social History  Substance Use Topics  . Smoking status: Never Smoker  . Smokeless tobacco: Never Used  . Alcohol use No     Allergies   Fish allergy and Sulfa antibiotics   Review of Systems Review of Systems  Constitutional: Negative for chills and fever.  Gastrointestinal: Negative for nausea and vomiting.  Musculoskeletal: Positive for joint swelling (R knee).  Skin: Negative for rash.     Physical Exam Updated Vital Signs BP (!) 186/71   Pulse (!) 57   Temp 97.9 F (36.6 C) (Oral)   Resp 16   Ht 5' (1.524 m)   Wt 52 kg (114 lb 9.6 oz)   SpO2 100%   BMI 22.38 kg/m   Physical Exam  Constitutional: She appears well-developed and well-nourished. No distress.  HENT:  Head: Normocephalic and atraumatic.  Eyes: Conjunctivae are normal.  Neck: Neck supple.  Cardiovascular: Normal rate and regular rhythm.   No murmur heard. Pulmonary/Chest: Effort normal and breath sounds normal. No respiratory distress.  Abdominal: Soft. There is no tenderness.  Musculoskeletal: She exhibits no edema.  Right knee with mild swelling. Small  joint effusion appreciated. No surrounding erythema. Mild tenderness to palpation. Not warm to palpation. Full passive range of motion without difficulty or pain. No other tenderness to palpation along the entire right lower extremity, including no tenderness to palpation to the hip, femur, tib-fib, ankle.  Neurological: She is alert.  Skin: Skin is warm and dry.  Psychiatric: She has a normal mood and affect.  Nursing note and vitals reviewed.    ED Treatments / Results  Labs (all labs ordered are listed, but only abnormal results are displayed) Labs  Reviewed - No data to display  EKG  EKG Interpretation None       Radiology Dg Knee Complete 4 Views Right  Result Date: 09/02/2016 CLINICAL DATA:  83 year old female with a history of right knee pain and swelling EXAM: RIGHT KNEE - COMPLETE 4+ VIEW COMPARISON:  None. FINDINGS: Soft tissue swelling along the medial right knee. No radiopaque foreign body. Lateral view demonstrates no joint effusion. Mild prepatellar soft tissue swelling. No displaced acute fracture. Medial and lateral joint space narrowing with marginal osteophyte formation. Chondrocalcinosis. Patellofemoral degenerative changes. Vascular calcifications. Calcific debris posterior to the joint space on the lateral view, may represent degenerative changes and/or loose bodies. IMPRESSION: Negative for acute bony abnormality. Soft tissue swelling evident along the medial knee, with possible prepatellar soft tissue swelling. Tricompartmental osteoarthritis. Chondrocalcinosis Electronically Signed   By: Corrie Mckusick D.O.   On: 09/02/2016 14:47    Procedures Procedures (including critical care time)  Medications Ordered in ED Medications  acetaminophen (TYLENOL) tablet 1,000 mg (1,000 mg Oral Given 09/02/16 1637)  ibuprofen (ADVIL,MOTRIN) tablet 600 mg (600 mg Oral Given 09/02/16 1637)     Initial Impression / Assessment and Plan / ED Course  I have reviewed the triage vital  signs and the nursing notes.  Pertinent labs & imaging results that were available during my care of the patient were reviewed by me and considered in my medical decision making (see chart for details).     Feel the most likely diagnosis for the patient is pain related to her DJD and possible bursitis. Encouraged the patient continue to take Tylenol and Motrin at home. Encouraged rice therapy.  No evidence of septic arthritis on exam. She denies any systemic symptoms. Afebrile here. No warmth, erythema, induration overlying the knee. Full range of motion without difficulty or pain.  X-ray shows no evidence of fracture. Read demonstrates DJD and a small joint effusion.  Patient has a caretaker. Told him to return to the emergency department should there be onset of systemic symptoms or concerns for septic joint. Told her to see a primary care doctor as soon as possible.  Final Clinical Impressions(s) / ED Diagnoses   Final diagnoses:  Acute pain of right knee    New Prescriptions New Prescriptions   No medications on file     Maryan Puls, MD 09/02/16 1705    Fredia Sorrow, MD 09/02/16 [phone]

## 2016-09-02 NOTE — Discharge Instructions (Signed)
Take tylenol 1000 mg and motrin 600 mg every 6 hours as needed.

## 2016-10-29 ENCOUNTER — Other Ambulatory Visit: Payer: Self-pay

## 2016-11-07 ENCOUNTER — Emergency Department (HOSPITAL_BASED_OUTPATIENT_CLINIC_OR_DEPARTMENT_OTHER): Payer: Medicare Other

## 2016-11-07 ENCOUNTER — Encounter (HOSPITAL_BASED_OUTPATIENT_CLINIC_OR_DEPARTMENT_OTHER): Payer: Self-pay | Admitting: Emergency Medicine

## 2016-11-07 ENCOUNTER — Observation Stay (HOSPITAL_BASED_OUTPATIENT_CLINIC_OR_DEPARTMENT_OTHER)
Admission: EM | Admit: 2016-11-07 | Discharge: 2016-11-08 | Disposition: A | Discharge status: Home / Self Care | Payer: Medicare Other | Attending: Family Medicine | Admitting: Family Medicine

## 2016-11-07 DIAGNOSIS — N183 Chronic kidney disease, stage 3 (moderate): Secondary | ICD-10-CM | POA: Insufficient documentation

## 2016-11-07 DIAGNOSIS — E785 Hyperlipidemia, unspecified: Secondary | ICD-10-CM | POA: Insufficient documentation

## 2016-11-07 DIAGNOSIS — K219 Gastro-esophageal reflux disease without esophagitis: Secondary | ICD-10-CM | POA: Diagnosis not present

## 2016-11-07 DIAGNOSIS — Z66 Do not resuscitate: Secondary | ICD-10-CM | POA: Insufficient documentation

## 2016-11-07 DIAGNOSIS — R109 Unspecified abdominal pain: Principal | ICD-10-CM | POA: Insufficient documentation

## 2016-11-07 DIAGNOSIS — I447 Left bundle-branch block, unspecified: Secondary | ICD-10-CM | POA: Insufficient documentation

## 2016-11-07 DIAGNOSIS — K3184 Gastroparesis: Secondary | ICD-10-CM | POA: Diagnosis not present

## 2016-11-07 DIAGNOSIS — N390 Urinary tract infection, site not specified: Secondary | ICD-10-CM | POA: Insufficient documentation

## 2016-11-07 DIAGNOSIS — E039 Hypothyroidism, unspecified: Secondary | ICD-10-CM | POA: Insufficient documentation

## 2016-11-07 DIAGNOSIS — I10 Essential (primary) hypertension: Secondary | ICD-10-CM | POA: Diagnosis present

## 2016-11-07 DIAGNOSIS — Z79899 Other long term (current) drug therapy: Secondary | ICD-10-CM | POA: Insufficient documentation

## 2016-11-07 DIAGNOSIS — K469 Unspecified abdominal hernia without obstruction or gangrene: Secondary | ICD-10-CM | POA: Diagnosis not present

## 2016-11-07 DIAGNOSIS — I129 Hypertensive chronic kidney disease with stage 1 through stage 4 chronic kidney disease, or unspecified chronic kidney disease: Secondary | ICD-10-CM | POA: Diagnosis not present

## 2016-11-07 DIAGNOSIS — Z7982 Long term (current) use of aspirin: Secondary | ICD-10-CM | POA: Insufficient documentation

## 2016-11-07 DIAGNOSIS — N184 Chronic kidney disease, stage 4 (severe): Secondary | ICD-10-CM | POA: Diagnosis present

## 2016-11-07 DIAGNOSIS — R1115 Cyclical vomiting syndrome unrelated to migraine: Secondary | ICD-10-CM

## 2016-11-07 DIAGNOSIS — N3000 Acute cystitis without hematuria: Secondary | ICD-10-CM

## 2016-11-07 LAB — LIPASE, BLOOD: Lipase: 59 U/L — ABNORMAL HIGH (ref 11–51)

## 2016-11-07 LAB — COMPREHENSIVE METABOLIC PANEL
ALT: 21 U/L (ref 14–54)
AST: 29 U/L (ref 15–41)
Albumin: 3.9 g/dL (ref 3.5–5.0)
Alkaline Phosphatase: 68 U/L (ref 38–126)
Anion gap: 11 (ref 5–15)
BUN: 32 mg/dL — ABNORMAL HIGH (ref 6–20)
CO2: 22 mmol/L (ref 22–32)
Calcium: 9.3 mg/dL (ref 8.9–10.3)
Chloride: 109 mmol/L (ref 101–111)
Creatinine, Ser: 1.23 mg/dL — ABNORMAL HIGH (ref 0.44–1.00)
GFR calc Af Amer: 43 mL/min — ABNORMAL LOW (ref >60)
GFR calc non Af Amer: 37 mL/min — ABNORMAL LOW (ref >60)
Glucose, Bld: 139 mg/dL — ABNORMAL HIGH (ref 65–99)
Potassium: 3.9 mmol/L (ref 3.5–5.1)
Sodium: 142 mmol/L (ref 135–145)
Total Bilirubin: 0.3 mg/dL (ref 0.3–1.2)
Total Protein: 7.4 g/dL (ref 6.5–8.1)

## 2016-11-07 LAB — CBC
HCT: 39.3 % (ref 36.0–46.0)
Hemoglobin: 13.5 g/dL (ref 12.0–15.0)
MCH: 32.4 pg (ref 26.0–34.0)
MCHC: 34.4 g/dL (ref 30.0–36.0)
MCV: 94.2 fL (ref 78.0–100.0)
Platelets: 229 10*3/uL (ref 150–400)
RBC: 4.17 MIL/uL (ref 3.87–5.11)
RDW: 13.6 % (ref 11.5–15.5)
WBC: 10.2 10*3/uL (ref 4.0–10.5)

## 2016-11-07 LAB — TROPONIN I: Troponin I: <0.03 ng/mL (ref <0.03)

## 2016-11-07 MED ORDER — ONDANSETRON HCL 4 MG/2ML IJ SOLN
4.0000 mg | Freq: Once | INTRAMUSCULAR | Status: AC
  Administered 2016-11-07: 4 mg via INTRAVENOUS
  Filled 2016-11-07: qty 2

## 2016-11-07 MED ORDER — FENTANYL CITRATE (PF) 100 MCG/2ML IJ SOLN
25.0000 ug | Freq: Once | INTRAMUSCULAR | Status: AC
  Administered 2016-11-07: 25 ug via INTRAVENOUS
  Filled 2016-11-07: qty 2

## 2016-11-07 NOTE — ED Notes (Signed)
EDP notified of pt's change in condition.

## 2016-11-07 NOTE — ED Provider Notes (Signed)
[facility] DEPT [facility] Provider Note   CSN: [id number] Arrival date & time: 11/07/16  2158  By signing my name below, I, [name], attest that this documentation has been prepared under the direction and in the presence of Ripley Fraise, MD. Electronically Signed: Lise Auer, ED Scribe. 11/07/16. 11:18 PM.  History   Chief Complaint Chief Complaint  Patient presents with  . Abdominal Pain    The history is provided by the patient. No language interpreter was used.  Abdominal Pain   This is a new problem. The current episode started less than 1 hour ago. The problem has been gradually worsening. The pain is located in the epigastric region. Associated symptoms include nausea. Pertinent negatives include fever, diarrhea and vomiting.    HPI HPI Comments: Cindy [name] is a 83 y.o. female with a PMHx of GERD, who presents to the Emergency Department complaining of sudden onset, persistent epigastric abdominal pain that began PTA. She notes associated nausea and intermittent right shoulder pain . Pt report when she was lying down preparing to go to bed she began to experience epigastric abdominal pain. She reports having a similar episode many years ago. Her last bowel movement was this morning, which she reports was normal. At this time her shoulder pain has resolved. Denies fever, vomiting, blood in stool, diarrhea, or shortness of breath.    Past Medical History:  Diagnosis Date  . Chronic renal insufficiency, stage III (moderate)   . Dyslipidemia   . GERD (gastroesophageal reflux disease)   . Hiatal hernia   . History of nuclear stress test 2005   persantine; inferoapical ischmeia with EF 83%  . Hypertension   . Hypothyroidism   . LBBB (left bundle branch block)   . Lung nodule 07/01/03   right middle lobe  . Macular degeneration   . Malignant melanoma ([place]) 11/12/04   L upper arm  . Murmur, cardiac   . Osteoporosis    Patient Active Problem List   Diagnosis Date  Noted  . SBO (small bowel obstruction) ([place]) 06/28/2016  . Constipation, chronic 06/28/2016  . Acute distention of stomach 06/28/2016  . Eventration of left crus of diaphragm 06/27/2016  . Uncontrolled hypertension 06/27/2016  . Gastric outlet obstruction 06/27/2016  . CKD (chronic kidney disease) stage 3, GFR 30-59 ml/min 06/14/2016  . Primary hypothyroidism 06/14/2016  . Osteoporosis, postmenopausal 06/14/2016  . OAB (overactive bladder) 02/25/2016  . Left bundle branch block 12/14/2012  . Essential hypertension 12/14/2012  . Hyperlipidemia 12/14/2012  . Pain in shoulder 11/11/2011  . Melanoma of left upper arm ([place]) 10/19/2011  . Lung mass 06/16/2011  . Acquired deformities of toe 06/14/2011  . Personal history of other diseases of the digestive system 06/07/2011  . GERD (gastroesophageal reflux disease) 06/07/2011   Past Surgical History:  Procedure Laterality Date  . BLADDER REPAIR    . CARDIAC CATHETERIZATION  2005/2007   normal coronaries   . CATARACT EXTRACTION     bilat  . coccyx  1951   excision  . knee     right cartilage repair  . ROTATOR CUFF REPAIR     right  . TOTAL ABDOMINAL HYSTERECTOMY W/ BILATERAL SALPINGOOPHORECTOMY    . TRANSTHORACIC ECHOCARDIOGRAM  2007   EF=>55%; borderline conc LVH; LA mod dilated; RA mild-mod dilated; mild TR; mild AVR with mildly sclerotic AV leavlefts; mild PV regurg    OB History    No data available     Home Medications    Prior to Admission  medications   Medication Sig Start Date End Date Taking? Authorizing Provider  aspirin 81 MG tablet Take 81 mg by mouth daily.    [provider]  calcium carbonate (OS-CAL - DOSED IN MG OF ELEMENTAL CALCIUM) 1250 (500 Ca) MG tablet Take 2 tablets by mouth 2 (two) times daily with a meal.    [provider]  CRANBERRY PO Take 1 tablet by mouth 3 (three) times daily.    [provider]  furosemide (LASIX) 20 MG tablet Take 20 mg by mouth daily.    [provider]  ipratropium (ATROVENT) 0.06 % nasal spray Place 1-2 sprays into both nostrils every 6 (six) hours as needed (rhinorrhea).    [provider]  levothyroxine (SYNTHROID, LEVOTHROID) 75 MCG tablet Take 1 tablet (75 mcg total) by mouth daily before breakfast. 07/01/16   Patrecia Pour, Christean Grief, MD  loratadine (CLARITIN) 10 MG tablet Take 10 mg by mouth daily.    [provider]  methocarbamol (ROBAXIN) 500 MG tablet Take 500 mg by mouth every 6 (six) hours as needed for muscle spasms.    [provider]  Multiple Vitamin (MULTIVITAMIN) capsule Take 1 capsule by mouth daily.    [provider]  Multiple Vitamins-Minerals (ICAPS MV PO) Take 1 capsule by mouth daily.     [provider]  omeprazole (PRILOSEC) 20 MG capsule Take 20 mg by mouth daily.    [provider]  OVER THE COUNTER MEDICATION Take 1 tablet by mouth 2 (two) times daily. Prescription eye vitamins that come in the mail.    [provider]  simvastatin (ZOCOR) 40 MG tablet Take 40 mg by mouth every evening.     [provider]  valsartan (DIOVAN) 160 MG tablet Take 160 mg by mouth daily.    [provider]   Family History Family History  Problem Relation Age of Onset  . Cancer Father        NHL  . Cancer Maternal Grandmother        rectal  . Cancer Daughter        breast  . Cancer Maternal Aunt        breast  . Cancer Son        melanoma   Social History Social History  Substance Use Topics  . Smoking status: Never Smoker  . Smokeless tobacco: Never Used  . Alcohol use No   Allergies   Fish allergy and Sulfa antibiotics  Review of Systems Review of Systems  Constitutional: Negative for fever.  Respiratory: Negative for shortness of breath.   Gastrointestinal: Positive for abdominal pain and nausea. Negative for blood in stool, diarrhea and vomiting.  All other systems reviewed and are negative.   Physical Exam Updated  Vital Signs BP (!) 198/82   Pulse 68   Temp 97.8 F (36.6 C) (Oral)   Resp (!) 23   SpO2 93%   Physical Exam CONSTITUTIONAL: Elderly and frail, appears to be actively vomiting HEAD: Normocephalic/atraumatic EYES: EOMI/PERRL. No icterus ENMT: Mucous membranes dry NECK: supple no meningeal signs SPINE/BACK:entire spine nontender CV: S1/S2 noted, no murmurs/rubs/gallops noted LUNGS: Lungs are clear to auscultation bilaterally, no apparent distress ABDOMEN: soft, moderate epigastric tenderness, no rebound or guarding, bowel sounds noted throughout abdomen GU:no cva tenderness NEURO: Pt is awake/alert/appropriate, moves all extremitiesx4.  No facial droop.   EXTREMITIES: pulses normal/equal, full ROM SKIN: warm, color normal PSYCH: no abnormalities of mood noted, alert and oriented to situation  ED Treatments / Results  DIAGNOSTIC STUDIES: Oxygen Saturation is 93% on RA, low by my interpretation.   COORDINATION OF CARE: 11:14 PM-Discussed next steps with pt. Pt verbalized understanding and is agreeable with the plan.   Labs (all labs ordered are listed, but only abnormal results are displayed) Labs Reviewed  LIPASE, BLOOD - Abnormal; Notable for the following:       Result Value   Lipase 59 (*)    All other components within normal limits  COMPREHENSIVE METABOLIC PANEL - Abnormal; Notable for the following:    Glucose, Bld 139 (*)    BUN 32 (*)    Creatinine, Ser 1.23 (*)    GFR calc non Af Amer 37 (*)    GFR calc Af Amer 43 (*)    All other components within normal limits  CBC  TROPONIN I  URINALYSIS, ROUTINE W REFLEX MICROSCOPIC    EKG  EKG Interpretation  Date/Time:  Sunday November 07 2016 22:12:39 EDT Ventricular Rate:  102 PR Interval:  178 QRS Duration: 130 QT Interval:  392 QTC Calculation: 510 R Axis:   -47 Text Interpretation:  Sinus tachycardia with occasional Premature ventricular complexes Left axis deviation Left bundle branch block Abnormal ECG  Since last tracing rate faster Confirmed by Dorie Rank [phone]) on 11/07/2016 10:15:32 PM       Radiology Dg Chest 2 View  Result Date: 11/07/2016 CLINICAL DATA:  83 year old female with tachypnea. EXAM: CHEST  2 VIEW COMPARISON:  Chest radiograph dated 06/27/2016 and CT dated 08/04/2011 FINDINGS: There is a large hiatal hernia containing portion of the stomach in the left hemithorax. There is associated mass effect and compressive atelectasis of the left lung base. These findings are similar to the prior radiograph and CT. The right lung is clear. There is no pleural effusion or pneumothorax. The cardiac silhouette is within normal limits. Osteopenia with degenerative changes of the spine. No acute osseous pathology. There is atherosclerotic calcification of the aorta. IMPRESSION: No active cardiopulmonary disease. 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 Electronically Signed   By: Anner Crete M.D.   On: 11/07/2016 23:31   Ct Abdomen Pelvis W Contrast  Result Date: 11/08/2016 CLINICAL DATA:  83 year old female with diffuse abdominal and epigastric pain and nausea. EXAM: CT ABDOMEN AND PELVIS WITH CONTRAST TECHNIQUE: Multidetector CT imaging of the abdomen and pelvis was performed using the standard protocol following bolus administration of intravenous contrast. CONTRAST:  198mL ISOVUE-300 IOPAMIDOL (ISOVUE-300) INJECTION 61% COMPARISON:  Chest radiograph dated 11/07/2016, abdominal radiograph dated 06/28/2016 and CT dated 06/27/2016 FINDINGS: Lower chest: There is diffuse interstitial coarsening of the lung bases. No focal consolidation. There is atelectatic changes of the left lung base secondary to herniation of a large portion of the stomach into the left hemithorax. Hepatobiliary: A 1 cm hypodense lesion in the inferior aspect of the right lobe of the liver is not characterized but likely represents a cyst or hemangioma. The liver is otherwise unremarkable. No intrahepatic  biliary ductal dilatation. The gallbladder is unremarkable. Pancreas: There is a 1.2 x 0.8 cm low attenuating area medial to the uncinate process of the pancreas (series 2, image 49) as seen on the prior CT. There is no pathologic atrophy of the pancreas or dilatation of the duct. No peripancreatic stranding. Spleen: Normal in size without focal abnormality. Adrenals/Urinary Tract: The adrenal glands are unremarkable with mild bilateral renal parenchymal atrophy and cortical thinning. Bilateral renal cysts measure up to 3.2 cm in the upper  pole of the left kidney. There is no hydronephrosis on either side. The visualized ureters and urinary bladder appear unremarkable. Stomach/Bowel: There is severe gaseous distention of the stomach. There is herniation of a large portion of the stomach into the left hemithorax as seen on the prior CT. No definite evidence of mechanical gastric outlet obstruction. There is extensive sigmoid diverticulosis with muscular hypertrophy. No definite active inflammation. No dilatation of the small bowel. Multiple normal caliber fecalized loops of small bowel may represent increased transit time versus small intestine bacterial overgrowth. The appendix is not visualized with certainty. No inflammatory changes identified in the right lower quadrant. Vascular/Lymphatic: There is advanced aortoiliac atherosclerotic disease. No aneurysmal dilatation or dissection. The SMV, splenic vein, and main portal vein are patent. No portal venous gas identified. No adenopathy. Reproductive: Hysterectomy.  No pelvic mass. Other: Small fat containing bilateral inguinal hernia. Musculoskeletal: Osteopenia with degenerative changes of the spine. New grade 2 L4-L5 anterolisthesis. No acute fracture. IMPRESSION: 1. Severe gaseous distention of the stomach likely related to functional dysmotility/gastroparesis. No definite evidence of mechanical gastric outlet obstruction. Stable appearance of herniation of a  large part of the stomach into the left hemithorax. 2. Sigmoid diverticulosis without active inflammatory changes. No evidence of bowel obstruction. 3. Other nonacute findings as above. Electronically Signed   By: Anner Crete M.D.   On: 11/08/2016 01:03    Procedures Procedures (including critical care time)  Medications Ordered in ED Medications  ondansetron (ZOFRAN) injection 4 mg (4 mg Intravenous Given 11/07/16 2332)  fentaNYL (SUBLIMAZE) injection 25 mcg (25 mcg Intravenous Given 11/07/16 2330)  ondansetron (ZOFRAN) injection 4 mg (4 mg Intravenous Given 11/08/16 0035)  iopamidol (ISOVUE-300) 61 % injection 100 mL (100 mLs Intravenous Contrast Given 11/08/16 0017)     Initial Impression / Assessment and Plan / ED Course  I have reviewed the triage vital signs and the nursing notes.  Pertinent labs & imaging results that were available during my care of the patient were reviewed by me and considered in my medical decision making (see chart for details).    2:14 AM  Elderly patient with significant nausea and some vomiting in the ED with focal ABD Tenderness CT reveals gastroparesis Pt reports continued nausea Will admit for rehydration/monitoring Pt agreeable D/w dr Hal Hope for admission to [facility] with multiple other incidental findings, can followed by outpatient providers Final Clinical Impressions(s) / ED Diagnoses   Final diagnoses:  Intractable cyclical vomiting with nausea  Gastroparesis    New Prescriptions New Prescriptions   No medications on file  I personally performed the services described in this documentation, which was scribed in my presence. The recorded information has been reviewed and is accurate.      Ripley Fraise, MD 11/08/16 [phone]

## 2016-11-07 NOTE — ED Notes (Addendum)
Pt now c/o pain to R shoulder. Pt states it is similar the last time she was admitted with severe hiatal hernia. Pt placed on 2L via N/C

## 2016-11-07 NOTE — ED Triage Notes (Signed)
PT presents with c/o epigastric pain and nausea . PT reports she ate chines food today then when she laid down to go to bed she felt nauseous and had epigastric pain.

## 2016-11-07 NOTE — ED Notes (Signed)
Patient was given an extra pillow and a cold wash clothe on patients forehead for comfort

## 2016-11-07 NOTE — ED Notes (Signed)
ED Provider at bedside. 

## 2016-11-08 ENCOUNTER — Encounter (HOSPITAL_COMMUNITY): Payer: Self-pay | Admitting: Internal Medicine

## 2016-11-08 DIAGNOSIS — I447 Left bundle-branch block, unspecified: Secondary | ICD-10-CM

## 2016-11-08 DIAGNOSIS — N3000 Acute cystitis without hematuria: Secondary | ICD-10-CM | POA: Diagnosis not present

## 2016-11-08 DIAGNOSIS — N390 Urinary tract infection, site not specified: Secondary | ICD-10-CM | POA: Diagnosis present

## 2016-11-08 DIAGNOSIS — R1013 Epigastric pain: Secondary | ICD-10-CM

## 2016-11-08 DIAGNOSIS — R109 Unspecified abdominal pain: Secondary | ICD-10-CM | POA: Diagnosis present

## 2016-11-08 DIAGNOSIS — N183 Chronic kidney disease, stage 3 (moderate): Secondary | ICD-10-CM

## 2016-11-08 DIAGNOSIS — E039 Hypothyroidism, unspecified: Secondary | ICD-10-CM

## 2016-11-08 DIAGNOSIS — I10 Essential (primary) hypertension: Secondary | ICD-10-CM

## 2016-11-08 LAB — HEPATIC FUNCTION PANEL
ALT: 24 U/L (ref 14–54)
AST: 28 U/L (ref 15–41)
Albumin: 3.4 g/dL — ABNORMAL LOW (ref 3.5–5.0)
Alkaline Phosphatase: 59 U/L (ref 38–126)
Bilirubin, Direct: <0.1 mg/dL — ABNORMAL LOW (ref 0.1–0.5)
Total Bilirubin: 0.3 mg/dL (ref 0.3–1.2)
Total Protein: 6.8 g/dL (ref 6.5–8.1)

## 2016-11-08 LAB — URINALYSIS, MICROSCOPIC (REFLEX)

## 2016-11-08 LAB — URINALYSIS, ROUTINE W REFLEX MICROSCOPIC
Bilirubin Urine: NEGATIVE
Glucose, UA: NEGATIVE mg/dL
Ketones, ur: NEGATIVE mg/dL
Nitrite: NEGATIVE
Protein, ur: 100 mg/dL — AB
Specific Gravity, Urine: 1.031 — ABNORMAL HIGH (ref 1.005–1.030)
pH: 5 (ref 5.0–8.0)

## 2016-11-08 LAB — CBC WITH DIFFERENTIAL/PLATELET
Basophils Absolute: 0 10*3/uL (ref 0.0–0.1)
Basophils Relative: 0 %
Eosinophils Absolute: 0 10*3/uL (ref 0.0–0.7)
Eosinophils Relative: 0 %
HCT: 35.3 % — ABNORMAL LOW (ref 36.0–46.0)
Hemoglobin: 12 g/dL (ref 12.0–15.0)
Lymphocytes Relative: 13 %
Lymphs Abs: 1.2 10*3/uL (ref 0.7–4.0)
MCH: 31.3 pg (ref 26.0–34.0)
MCHC: 34 g/dL (ref 30.0–36.0)
MCV: 92.2 fL (ref 78.0–100.0)
Monocytes Absolute: 0.6 10*3/uL (ref 0.1–1.0)
Monocytes Relative: 6 %
Neutro Abs: 7.5 10*3/uL (ref 1.7–7.7)
Neutrophils Relative %: 81 %
Platelets: 212 10*3/uL (ref 150–400)
RBC: 3.83 MIL/uL — ABNORMAL LOW (ref 3.87–5.11)
RDW: 13.7 % (ref 11.5–15.5)
WBC: 9.4 10*3/uL (ref 4.0–10.5)

## 2016-11-08 LAB — BASIC METABOLIC PANEL
Anion gap: 7 (ref 5–15)
BUN: 27 mg/dL — ABNORMAL HIGH (ref 6–20)
CO2: 25 mmol/L (ref 22–32)
Calcium: 9.1 mg/dL (ref 8.9–10.3)
Chloride: 110 mmol/L (ref 101–111)
Creatinine, Ser: 1.11 mg/dL — ABNORMAL HIGH (ref 0.44–1.00)
GFR calc Af Amer: 48 mL/min — ABNORMAL LOW (ref >60)
GFR calc non Af Amer: 42 mL/min — ABNORMAL LOW (ref >60)
Glucose, Bld: 140 mg/dL — ABNORMAL HIGH (ref 65–99)
Potassium: 4.3 mmol/L (ref 3.5–5.1)
Sodium: 142 mmol/L (ref 135–145)

## 2016-11-08 LAB — LIPASE, BLOOD: Lipase: 53 U/L — ABNORMAL HIGH (ref 11–51)

## 2016-11-08 LAB — GLUCOSE, CAPILLARY: Glucose-Capillary: 108 mg/dL — ABNORMAL HIGH (ref 65–99)

## 2016-11-08 MED ORDER — PANTOPRAZOLE SODIUM 40 MG PO TBEC
40.0000 mg | DELAYED_RELEASE_TABLET | Freq: Every day | ORAL | Status: DC
  Administered 2016-11-08: 40 mg via ORAL
  Filled 2016-11-08: qty 1

## 2016-11-08 MED ORDER — CEPHALEXIN 500 MG PO CAPS: 500.0000 mg | ORAL_CAPSULE | Freq: Two times a day (BID) | ORAL | 0 refills | Status: DC

## 2016-11-08 MED ORDER — IPRATROPIUM BROMIDE 0.06 % NA SOLN: 1.0000 | Freq: Four times a day (QID) | NASAL | Status: DC | PRN

## 2016-11-08 MED ORDER — ONDANSETRON HCL 4 MG/2ML IJ SOLN: 4.0000 mg | Freq: Four times a day (QID) | INTRAMUSCULAR | Status: DC | PRN

## 2016-11-08 MED ORDER — LORATADINE 10 MG PO TABS
10.0000 mg | ORAL_TABLET | Freq: Every day | ORAL | Status: DC
  Administered 2016-11-08: 10 mg via ORAL
  Filled 2016-11-08: qty 1

## 2016-11-08 MED ORDER — SIMVASTATIN 40 MG PO TABS: 40.0000 mg | ORAL_TABLET | Freq: Every evening | ORAL | Status: DC

## 2016-11-08 MED ORDER — ASPIRIN EC 81 MG PO TBEC
81.0000 mg | DELAYED_RELEASE_TABLET | Freq: Every day | ORAL | Status: DC
  Administered 2016-11-08: 81 mg via ORAL
  Filled 2016-11-08: qty 1

## 2016-11-08 MED ORDER — IOPAMIDOL (ISOVUE-300) INJECTION 61%
100.0000 mL | Freq: Once | INTRAVENOUS | Status: AC | PRN
  Administered 2016-11-08: 100 mL via INTRAVENOUS

## 2016-11-08 MED ORDER — SODIUM CHLORIDE 0.9 % IV SOLN
INTRAVENOUS | Status: DC
  Administered 2016-11-08: 06:00:00 via INTRAVENOUS

## 2016-11-08 MED ORDER — ONDANSETRON HCL 4 MG/2ML IJ SOLN
4.0000 mg | Freq: Once | INTRAMUSCULAR | Status: AC
  Administered 2016-11-08: 4 mg via INTRAVENOUS
  Filled 2016-11-08: qty 2

## 2016-11-08 MED ORDER — ACETAMINOPHEN 325 MG PO TABS: 650.0000 mg | ORAL_TABLET | Freq: Four times a day (QID) | ORAL | Status: DC | PRN

## 2016-11-08 MED ORDER — LEVOTHYROXINE SODIUM 75 MCG PO TABS
75.0000 ug | ORAL_TABLET | Freq: Every day | ORAL | Status: DC
  Administered 2016-11-08: 75 ug via ORAL
  Filled 2016-11-08: qty 1

## 2016-11-08 MED ORDER — CALCIUM CARBONATE 1250 (500 CA) MG PO TABS
2.0000 | ORAL_TABLET | Freq: Two times a day (BID) | ORAL | Status: DC
  Administered 2016-11-08: 1000 mg via ORAL
  Filled 2016-11-08: qty 1

## 2016-11-08 MED ORDER — CEFTRIAXONE SODIUM 1 G IJ SOLR: 1.0000 g | INTRAMUSCULAR | Status: DC

## 2016-11-08 MED ORDER — ONDANSETRON HCL 4 MG PO TABS
4.0000 mg | ORAL_TABLET | Freq: Four times a day (QID) | ORAL | Status: DC | PRN
Start: 2016-11-08 — End: 2016-11-08

## 2016-11-08 MED ORDER — ACETAMINOPHEN 650 MG RE SUPP: 650.0000 mg | Freq: Four times a day (QID) | RECTAL | Status: DC | PRN

## 2016-11-08 MED ORDER — HYDRALAZINE HCL 20 MG/ML IJ SOLN
10.0000 mg | INTRAMUSCULAR | Status: DC | PRN
  Filled 2016-11-08: qty 0.5

## 2016-11-08 MED ORDER — IRBESARTAN 150 MG PO TABS
150.0000 mg | ORAL_TABLET | Freq: Every day | ORAL | Status: DC
  Administered 2016-11-08: 150 mg via ORAL
  Filled 2016-11-08: qty 1

## 2016-11-08 MED ORDER — DEXTROSE 5 % IV SOLN
1.0000 g | Freq: Once | INTRAVENOUS | Status: AC
  Administered 2016-11-08: 1 g via INTRAVENOUS
  Filled 2016-11-08: qty 10

## 2016-11-08 MED ORDER — METHOCARBAMOL 500 MG PO TABS: 500.0000 mg | ORAL_TABLET | Freq: Four times a day (QID) | ORAL | Status: DC | PRN

## 2016-11-08 NOTE — Discharge Summary (Signed)
Physician Discharge Summary  Cindy [name] IBB:[id number] DOB: [birth year] DOA: 11/07/2016  PCP: Lorne Skeens, MD  Admit date: 11/07/2016 Discharge date: 11/08/2016  Admitted From: Home Disposition: Home   Recommendations for Outpatient Follow-up:  1. Follow up with PCP and/or GI in 1-2 weeks.   Home Health: None Equipment/Devices: None Discharge Condition: Stable CODE STATUS: Full Diet recommendation: Mechanical soft  Brief/Interim Summary: Cindy [name] is a 83 y.o. female with a history of HTN, stage III CKD, hypothyroidism and hiatal hernia who presented to the ED 7/29 due to severe abdominal pain. She reported having [place] food around 3:30pm and had no problem until around 7pm when she had abrupt, severe, epigastric abdominal pain without any associated symptoms (no N/V/D, chest pain or dyspnea). In the ED, CT abdomen and pelvis showed gastric distention concerning for gastroparesis with known history of stomach herniating into the left hemithorax. BP was markedly elevated, improved with anti-hypertensives, and urinalysis was concerning for UTI, so ceftriaxone was given. Shortly thereafter, abdominal pain completely subsided. She was transferred to [facility] for observation. Diet was advanced slowly without any recurrence of symptoms. This is similar to a previous episode in March. Gastroparesis was suspected, though the patient is unable to tolerate the po contrast required for a gastric emptying study. Risks and benefits of empiric treatment with reglan for sporadic fleeting symptoms not definitively related to gastroparesis were reviewed. The patient wishes to make no medication changes. With resolved symptoms she was discharged.   Discharge Diagnoses:  Principal Problem:   Abdominal pain Active Problems:   Left bundle branch block   Uncontrolled hypertension   CKD (chronic kidney disease) stage 3, GFR 30-59 ml/min   Primary hypothyroidism   Acute lower UTI   Acute cystitis without  hematuria  Abdominal pain: Not entirely clear on cause, though CT scan showed distended stomach.  - Clinically this has completely resolved despite advancing diet.  - Is unable to undergo gastroscintigraphy, recommend follow up with PCP and GI   Symptomatic pyuria:  - Given CTX, Rx keflex 500mg  BID given renal function and follow urine culture  Hypertension: Uncontrolled, possibly related to pain, improved with antihypertensives and resolution of symptoms.  - Restart home medications, outpatient follow up  Chronic kidney disease stage III: SCr improved modestly 1.23 > 1.11 overnight, recheck at follow up. Continuing ARB.  Hypothyroidism on Synthroid.  Chronic LBBB.  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  Discharge Instructions Discharge Instructions    Discharge instructions    Complete by:  As directed    You were admitted for abdominal pain which has improved spontaneously. Imaging suggests that the stomach outlet became obstructed. This is likely related to the hiatal hernia and may be associated with gastroparesis (impaired gastric emptying due to increased tone of the gastric sphincter). Unfortunately, this requires a gastric emptying scan to diagnose, which you cannot tolerate. The treatment for this would be reglan, though this has been associated with side effects, so this will not be started in light of very inconsistent symptoms.  - Follow up with your PCP in the next 1 - 2 weeks to discuss treatments for this and your blood pressure. Your BP is elevated above goal but does not require medications changes at this time. - There was evidence of a urinary tract infection (UTI) on the urine sample you provided in the ED. This can be treated with keflex (antibiotic) twice daily for 6 more days.  - If your symptoms recur seek medical  attention right away.     Allergies as of 11/08/2016      Reactions   Fish Allergy Hives   Sulfa Antibiotics     Unknown reaction      Medication List    TAKE these medications   aspirin 81 MG tablet Take 81 mg by mouth daily.   calcium carbonate 1250 (500 Ca) MG tablet Commonly known as:  OS-CAL - dosed in mg of elemental calcium Take 2 tablets by mouth 2 (two) times daily with a meal.   cephALEXin 500 MG capsule Commonly known as:  KEFLEX Take 1 capsule (500 mg total) by mouth 2 (two) times daily.   CRANBERRY PO Take 1 tablet by mouth 3 (three) times daily.   furosemide 20 MG tablet Commonly known as:  LASIX Take 20 mg by mouth daily.   ICAPS MV PO Take 1 capsule by mouth daily.   ipratropium 0.06 % nasal spray Commonly known as:  ATROVENT Place 1-2 sprays into both nostrils every 6 (six) hours as needed (rhinorrhea).   levothyroxine 75 MCG tablet Commonly known as:  SYNTHROID, LEVOTHROID Take 1 tablet (75 mcg total) by mouth daily before breakfast.   loratadine 10 MG tablet Commonly known as:  CLARITIN Take 10 mg by mouth daily.   methocarbamol 500 MG tablet Commonly known as:  ROBAXIN Take 500 mg by mouth every 6 (six) hours as needed for muscle spasms.   multivitamin capsule Take 1 capsule by mouth daily.   omeprazole 20 MG capsule Commonly known as:  PRILOSEC Take 20 mg by mouth daily.   OVER THE COUNTER MEDICATION Take 1 tablet by mouth 2 (two) times daily. Prescription eye vitamins that come in the mail.   simvastatin 40 MG tablet Commonly known as:  ZOCOR Take 40 mg by mouth every evening.   valsartan 160 MG tablet Commonly known as:  DIOVAN Take 160 mg by mouth daily.      Follow-up Information    Altheimer, Legrand Como, MD Follow up.   Specialty:  Endocrinology Contact information: [place] [address] [phone]          Allergies  Allergen Reactions  . Fish Allergy Hives  . Sulfa Antibiotics     Unknown reaction    Consultations:  None  Procedures/Studies: Dg Chest 2 View  Result Date:  11/07/2016 CLINICAL DATA:  83 year old female with tachypnea. EXAM: CHEST  2 VIEW COMPARISON:  Chest radiograph dated 06/27/2016 and CT dated 08/04/2011 FINDINGS: There is a large hiatal hernia containing portion of the stomach in the left hemithorax. There is associated mass effect and compressive atelectasis of the left lung base. These findings are similar to the prior radiograph and CT. The right lung is clear. There is no pleural effusion or pneumothorax. The cardiac silhouette is within normal limits. Osteopenia with degenerative changes of the spine. No acute osseous pathology. There is atherosclerotic calcification of the aorta. IMPRESSION: No active cardiopulmonary disease. 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 Electronically Signed   By: Anner Crete M.D.   On: 11/07/2016 23:31   Ct Abdomen Pelvis W Contrast  Result Date: 11/08/2016 CLINICAL DATA:  83 year old female with diffuse abdominal and epigastric pain and nausea. EXAM: CT ABDOMEN AND PELVIS WITH CONTRAST TECHNIQUE: Multidetector CT imaging of the abdomen and pelvis was performed using the standard protocol following bolus administration of intravenous contrast. CONTRAST:  136mL ISOVUE-300 IOPAMIDOL (ISOVUE-300) INJECTION 61% COMPARISON:  Chest radiograph dated 11/07/2016, abdominal radiograph dated 06/28/2016  and CT dated 06/27/2016 FINDINGS: Lower chest: There is diffuse interstitial coarsening of the lung bases. No focal consolidation. There is atelectatic changes of the left lung base secondary to herniation of a large portion of the stomach into the left hemithorax. Hepatobiliary: A 1 cm hypodense lesion in the inferior aspect of the right lobe of the liver is not characterized but likely represents a cyst or hemangioma. The liver is otherwise unremarkable. No intrahepatic biliary ductal dilatation. The gallbladder is unremarkable. Pancreas: There is a 1.2 x 0.8 cm low attenuating area medial to the uncinate process  of the pancreas (series 2, image 49) as seen on the prior CT. There is no pathologic atrophy of the pancreas or dilatation of the duct. No peripancreatic stranding. Spleen: Normal in size without focal abnormality. Adrenals/Urinary Tract: The adrenal glands are unremarkable with mild bilateral renal parenchymal atrophy and cortical thinning. Bilateral renal cysts measure up to 3.2 cm in the upper pole of the left kidney. There is no hydronephrosis on either side. The visualized ureters and urinary bladder appear unremarkable. Stomach/Bowel: There is severe gaseous distention of the stomach. There is herniation of a large portion of the stomach into the left hemithorax as seen on the prior CT. No definite evidence of mechanical gastric outlet obstruction. There is extensive sigmoid diverticulosis with muscular hypertrophy. No definite active inflammation. No dilatation of the small bowel. Multiple normal caliber fecalized loops of small bowel may represent increased transit time versus small intestine bacterial overgrowth. The appendix is not visualized with certainty. No inflammatory changes identified in the right lower quadrant. Vascular/Lymphatic: There is advanced aortoiliac atherosclerotic disease. No aneurysmal dilatation or dissection. The SMV, splenic vein, and main portal vein are patent. No portal venous gas identified. No adenopathy. Reproductive: Hysterectomy.  No pelvic mass. Other: Small fat containing bilateral inguinal hernia. Musculoskeletal: Osteopenia with degenerative changes of the spine. New grade 2 L4-L5 anterolisthesis. No acute fracture. IMPRESSION: 1. Severe gaseous distention of the stomach likely related to functional dysmotility/gastroparesis. No definite evidence of mechanical gastric outlet obstruction. Stable appearance of herniation of a large part of the stomach into the left hemithorax. 2. Sigmoid diverticulosis without active inflammatory changes. No evidence of bowel obstruction.  3. Other nonacute findings as above. Electronically Signed   By: Anner Crete M.D.   On: 11/08/2016 01:03   Subjective: Pt feels 100% well. No recurrent symptoms despite a soft lunch after liquid breakfast.   Discharge Exam: BP (!) 172/66 (BP Location: Left Arm)   Pulse (!) 48   Temp 98.4 F (36.9 C) (Oral)   Resp (!) 22   Ht 5' (1.524 m)   Wt 52 kg (114 lb 10.2 oz)   SpO2 100%   BMI 22.39 kg/m   General: Pt is alert, awake, not in acute distress Cardiovascular: RRR, S1/S2 +, no rubs, no gallops Respiratory: CTA bilaterally, no wheezing, no rhonchi Abdominal: Soft, NT, ND, bowel sounds + Extremities: No edema, no cyanosis  Labs: Basic Metabolic Panel:  Recent Labs Lab 11/07/16 2306 11/08/16 0619  NA 142 142  K 3.9 4.3  CL 109 110  CO2 22 25  GLUCOSE 139* 140*  BUN 32* 27*  CREATININE 1.23* 1.11*  CALCIUM 9.3 9.1   Liver Function Tests:  Recent Labs Lab 11/07/16 2306 11/08/16 0619  AST 29 28  ALT 21 24  ALKPHOS 68 59  BILITOT 0.3 0.3  PROT 7.4 6.8  ALBUMIN 3.9 3.4*    Recent Labs Lab 11/07/16 2306 11/08/16 5093  LIPASE 59* 53*   CBC:  Recent Labs Lab 11/07/16 2306 11/08/16 0619  WBC 10.2 9.4  NEUTROABS  --  7.5  HGB 13.5 12.0  HCT 39.3 35.3*  MCV 94.2 92.2  PLT 229 212   Cardiac Enzymes:  Recent Labs Lab 11/07/16 2306  TROPONINI <0.03   CBG:  Recent Labs Lab 11/08/16 0733  GLUCAP 108*   Urinalysis    Component Value Date/Time   COLORURINE YELLOW 11/07/2016 0216   APPEARANCEUR CLOUDY (A) 11/07/2016 0216   LABSPEC 1.031 (H) 11/07/2016 0216   PHURINE 5.0 11/07/2016 0216   GLUCOSEU NEGATIVE 11/07/2016 0216   HGBUR MODERATE (A) 11/07/2016 0216   BILIRUBINUR NEGATIVE 11/07/2016 0216   KETONESUR NEGATIVE 11/07/2016 0216   PROTEINUR 100 (A) 11/07/2016 0216   UROBILINOGEN 0.2 11/22/2013 2218   NITRITE NEGATIVE 11/07/2016 0216   LEUKOCYTESUR LARGE (A) 11/07/2016 0216   Time coordinating discharge: Approximately 40  minutes  Vance Gather, MD  [facility] 11/08/2016, 5:21 PM Pager [phone]

## 2016-11-08 NOTE — ED Notes (Signed)
carelink here to transport pt to [facility]  

## 2016-11-08 NOTE — Progress Notes (Signed)
Initial Nutrition Assessment  INTERVENTION:   Encouraged intake at meals/snacks Reviewed menu with pt and caregiver   NUTRITION DIAGNOSIS:   Inadequate oral intake related to acute illness (N/V) as evidenced by per patient/family report.  GOAL:   Patient will meet greater than or equal to 90% of their needs  MONITOR:   PO intake  REASON FOR ASSESSMENT:   Malnutrition Screening Tool    ASSESSMENT:   Pt with PMH of HTN, hypothyroidism, CKD stage III admitted with abdominal pain (concern for gastroparesis) and uncontrolled HTN from pain.    Caregiver of 3 years in pt's room. He reports that he is with her Monday - Friday Breakfast until before supper. They go to the grocery store almost everyday to pick up food for that day or for breakfast the next day.   Breakfast is toast and coffee Lunch is a meat sandwich on a large bakery roll, pickles or bean salad, chocolate cake or chocolate candies Dinner is a frozen meal Saturday daughter cooks Sunday she goes out for dinner with family She does not drink supplements. She has gained weight over the last year. She is able to walk and sometimes drives short distances.   She has had another episode of N/V in March but after it was over felt fine with good appetite.  Medications reviewed and include: os-cal Nutrition-Focused physical exam completed. Findings are no fat depletion, mild/moderate muscle depletion (suspect age related), and no edema.     Diet Order:  DIET SOFT Room service appropriate? Yes; Fluid consistency: Thin  Skin:  Reviewed, no issues  Last BM:  7/29  Height:   Ht Readings from Last 1 Encounters:  11/08/16 5' (1.524 m)    Weight:   Wt Readings from Last 1 Encounters:  11/08/16 114 lb 10.2 oz (52 kg)    Ideal Body Weight:  45.4 kg  BMI:  Body mass index is 22.39 kg/m.  Estimated Nutritional Needs:   Kcal:  1300-1500  Protein:  55-65 grams  Fluid:  > 1.5 L/day  EDUCATION NEEDS:    Education needs addressed  Maylon Peppers RD, [place], [place] Pager [phone] After Hours Pager

## 2016-11-08 NOTE — H&P (Signed)
History and Physical    Cindy [name] PZW:[id number] DOB: 10-19-1922 DOA: 11/07/2016  PCP: Lorne Skeens, MD  Patient coming from: Home.  Chief Complaint: Abdominal pain.  HPI: Cindy [name] is a 83 y.o. female with history of hypertension, hypothyroidism, chronic kidney disease stage III presents to the ER with complaints of abdominal pain. Patient states last evening around 3 PM patient had gone to have [place] and had no problem after eating. Later in the evening around 7 PM patient started having severe abdominal pain epigastric in nature with no associated nausea vomiting or diarrhea. Pain persisted nonradiating sharp. Since the pain did not get relate patient came to the ER at [facility].  ED Course: In the ER CT abdomen and pelvis done shows gastric distention concerning for gastroparesis with known history of stomach herniating into the left hemithorax. Patient was given fentanyl and antibiotics for possible UTI. Patient was admitted for further observation. Patient's blood pressure also was markedly elevated. Patient was transferred to [facility] for admission. At the time of my exam patient's symptoms have largely resolved and abdomen appears benign. Patient is eager to eat something.  Review of Systems: As per HPI, rest all negative.   Past Medical History:  Diagnosis Date  . Chronic renal insufficiency, stage III (moderate)   . Dyslipidemia   . GERD (gastroesophageal reflux disease)   . Hiatal hernia   . History of nuclear stress test 2005   persantine; inferoapical ischmeia with EF 83%  . Hypertension   . Hypothyroidism   . LBBB (left bundle branch block)   . Lung nodule 07/01/03   right middle lobe  . Macular degeneration   . Malignant melanoma ([place]) 11/12/04   L upper arm  . Murmur, cardiac   . Osteoporosis     Past Surgical History:  Procedure Laterality Date  . BLADDER REPAIR    . CARDIAC CATHETERIZATION  2005/2007   normal coronaries    . CATARACT EXTRACTION     bilat  . coccyx  1951   excision  . knee     right cartilage repair  . ROTATOR CUFF REPAIR     right  . TOTAL ABDOMINAL HYSTERECTOMY W/ BILATERAL SALPINGOOPHORECTOMY    . TRANSTHORACIC ECHOCARDIOGRAM  2007   EF=>55%; borderline conc LVH; LA mod dilated; RA mild-mod dilated; mild TR; mild AVR with mildly sclerotic AV leavlefts; mild PV regurg      reports that she has never smoked. She has never used smokeless tobacco. She reports that she does not drink alcohol or use drugs.  Allergies  Allergen Reactions  . Fish Allergy Hives  . Sulfa Antibiotics Other (See Comments)    Reaction unknown    Family History  Problem Relation Age of Onset  . Cancer Father        NHL  . Cancer Maternal Grandmother        rectal  . Cancer Daughter        breast  . Cancer Maternal Aunt        breast  . Cancer Son        melanoma    Prior to Admission medications   Medication Sig Start Date End Date Taking? Authorizing Provider  aspirin 81 MG tablet Take 81 mg by mouth daily.    [provider]  calcium carbonate (OS-CAL - DOSED IN MG OF ELEMENTAL CALCIUM) 1250 (500 Ca) MG tablet Take 2 tablets by mouth 2 (two) times daily with a meal.  [provider]  CRANBERRY PO Take 1 tablet by mouth 3 (three) times daily.    [provider]  furosemide (LASIX) 20 MG tablet Take 20 mg by mouth daily.    [provider]  ipratropium (ATROVENT) 0.06 % nasal spray Place 1-2 sprays into both nostrils every 6 (six) hours as needed (rhinorrhea).    [provider]  levothyroxine (SYNTHROID, LEVOTHROID) 75 MCG tablet Take 1 tablet (75 mcg total) by mouth daily before breakfast. 07/01/16   Patrecia Pour, Christean Grief, MD  loratadine (CLARITIN) 10 MG tablet Take 10 mg by mouth daily.    [provider]  methocarbamol (ROBAXIN) 500 MG tablet Take 500 mg by mouth every 6 (six) hours as needed for muscle spasms.    [provider]    Multiple Vitamin (MULTIVITAMIN) capsule Take 1 capsule by mouth daily.    [provider]  Multiple Vitamins-Minerals (ICAPS MV PO) Take 1 capsule by mouth daily.     [provider]  omeprazole (PRILOSEC) 20 MG capsule Take 20 mg by mouth daily.    [provider]  OVER THE COUNTER MEDICATION Take 1 tablet by mouth 2 (two) times daily. Prescription eye vitamins that come in the mail.    [provider]  simvastatin (ZOCOR) 40 MG tablet Take 40 mg by mouth every evening.     [provider]  valsartan (DIOVAN) 160 MG tablet Take 160 mg by mouth daily.    [provider]    Physical Exam: Vitals:   11/08/16 0300 11/08/16 0325 11/08/16 0330 11/08/16 0436  BP: (!) 162/69 (!) 178/78  (!) 172/66  Pulse: (!) 42 (!) 48  (!) 48  Resp: 14 17  (!) 22  Temp:   97.8 F (36.6 C) 98.4 F (36.9 C)  TempSrc:    Oral  SpO2: 100% 100%  100%  Weight:    52 kg (114 lb 10.2 oz)  Height:    5' (1.524 m)      Constitutional: Moderately built and nourished. Vitals:   11/08/16 0300 11/08/16 0325 11/08/16 0330 11/08/16 0436  BP: (!) 162/69 (!) 178/78  (!) 172/66  Pulse: (!) 42 (!) 48  (!) 48  Resp: 14 17  (!) 22  Temp:   97.8 F (36.6 C) 98.4 F (36.9 C)  TempSrc:    Oral  SpO2: 100% 100%  100%  Weight:    52 kg (114 lb 10.2 oz)  Height:    5' (1.524 m)   Eyes: Anicteric no pallor. ENMT: No discharge from the ears eyes nose and mouth. Neck: No mass felt. No neck rigidity. Respiratory: No rhonchi or crepitations. Cardiovascular: S1-S2 heard no murmurs appreciated. Abdomen: Soft nontender bowel sounds present. No guarding or rigidity. Musculoskeletal: No edema. No joint effusion. Skin: No rash. Skin appears warm. Neurologic: Alert awake oriented to time place and person. Moves all extremities. Psychiatric: Appears normal. Normal affect.   Labs on Admission: I have personally reviewed following labs and imaging studies  CBC:  Recent  Labs Lab 11/07/16 2306  WBC 10.2  HGB 13.5  HCT 39.3  MCV 94.2  PLT 258   Basic Metabolic Panel:  Recent Labs Lab 11/07/16 2306  NA 142  K 3.9  CL 109  CO2 22  GLUCOSE 139*  BUN 32*  CREATININE 1.23*  CALCIUM 9.3   GFR: Estimated Creatinine Clearance: 21 mL/min (A) (by C-G formula based on SCr of 1.23 mg/dL (H)). Liver Function Tests:  Recent Labs  Lab 11/07/16 2306  AST 29  ALT 21  ALKPHOS 68  BILITOT 0.3  PROT 7.4  ALBUMIN 3.9    Recent Labs Lab 11/07/16 2306  LIPASE 59*   No results for input(s): AMMONIA in the last 168 hours. Coagulation Profile: No results for input(s): INR, PROTIME in the last 168 hours. Cardiac Enzymes:  Recent Labs Lab 11/07/16 2306  TROPONINI <0.03   BNP (last 3 results) No results for input(s): PROBNP in the last 8760 hours. HbA1C: No results for input(s): HGBA1C in the last 72 hours. CBG: No results for input(s): GLUCAP in the last 168 hours. Lipid Profile: No results for input(s): CHOL, HDL, LDLCALC, TRIG, CHOLHDL, LDLDIRECT in the last 72 hours. Thyroid Function Tests: No results for input(s): TSH, T4TOTAL, FREET4, T3FREE, THYROIDAB in the last 72 hours. Anemia Panel: No results for input(s): VITAMINB12, FOLATE, FERRITIN, TIBC, IRON, RETICCTPCT in the last 72 hours. Urine analysis:    Component Value Date/Time   COLORURINE YELLOW 11/07/2016 0216   APPEARANCEUR CLOUDY (A) 11/07/2016 0216   LABSPEC 1.031 (H) 11/07/2016 0216   PHURINE 5.0 11/07/2016 0216   GLUCOSEU NEGATIVE 11/07/2016 0216   HGBUR MODERATE (A) 11/07/2016 0216   BILIRUBINUR NEGATIVE 11/07/2016 0216   KETONESUR NEGATIVE 11/07/2016 0216   PROTEINUR 100 (A) 11/07/2016 0216   UROBILINOGEN 0.2 11/22/2013 2218   NITRITE NEGATIVE 11/07/2016 0216   LEUKOCYTESUR LARGE (A) 11/07/2016 0216   Sepsis Labs: @LABRCNTIP (procalcitonin:4,lacticidven:4) )No results found for this or any previous visit (from the past 240 hour(s)).   Radiological Exams on  Admission: Dg Chest 2 View  Result Date: 11/07/2016 CLINICAL DATA:  83 year old female with tachypnea. EXAM: CHEST  2 VIEW COMPARISON:  Chest radiograph dated 06/27/2016 and CT dated 08/04/2011 FINDINGS: There is a large hiatal hernia containing portion of the stomach in the left hemithorax. There is associated mass effect and compressive atelectasis of the left lung base. These findings are similar to the prior radiograph and CT. The right lung is clear. There is no pleural effusion or pneumothorax. The cardiac silhouette is within normal limits. Osteopenia with degenerative changes of the spine. No acute osseous pathology. There is atherosclerotic calcification of the aorta. IMPRESSION: No active cardiopulmonary disease. 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 Electronically Signed   By: Anner Crete M.D.   On: 11/07/2016 23:31   Ct Abdomen Pelvis W Contrast  Result Date: 11/08/2016 CLINICAL DATA:  83 year old female with diffuse abdominal and epigastric pain and nausea. EXAM: CT ABDOMEN AND PELVIS WITH CONTRAST TECHNIQUE: Multidetector CT imaging of the abdomen and pelvis was performed using the standard protocol following bolus administration of intravenous contrast. CONTRAST:  147mL ISOVUE-300 IOPAMIDOL (ISOVUE-300) INJECTION 61% COMPARISON:  Chest radiograph dated 11/07/2016, abdominal radiograph dated 06/28/2016 and CT dated 06/27/2016 FINDINGS: Lower chest: There is diffuse interstitial coarsening of the lung bases. No focal consolidation. There is atelectatic changes of the left lung base secondary to herniation of a large portion of the stomach into the left hemithorax. Hepatobiliary: A 1 cm hypodense lesion in the inferior aspect of the right lobe of the liver is not characterized but likely represents a cyst or hemangioma. The liver is otherwise unremarkable. No intrahepatic biliary ductal dilatation. The gallbladder is unremarkable. Pancreas: There is a 1.2 x 0.8 cm low  attenuating area medial to the uncinate process of the pancreas (series 2, image 49) as seen on the prior CT. There is no pathologic atrophy of the pancreas or dilatation of the duct. No peripancreatic  stranding. Spleen: Normal in size without focal abnormality. Adrenals/Urinary Tract: The adrenal glands are unremarkable with mild bilateral renal parenchymal atrophy and cortical thinning. Bilateral renal cysts measure up to 3.2 cm in the upper pole of the left kidney. There is no hydronephrosis on either side. The visualized ureters and urinary bladder appear unremarkable. Stomach/Bowel: There is severe gaseous distention of the stomach. There is herniation of a large portion of the stomach into the left hemithorax as seen on the prior CT. No definite evidence of mechanical gastric outlet obstruction. There is extensive sigmoid diverticulosis with muscular hypertrophy. No definite active inflammation. No dilatation of the small bowel. Multiple normal caliber fecalized loops of small bowel may represent increased transit time versus small intestine bacterial overgrowth. The appendix is not visualized with certainty. No inflammatory changes identified in the right lower quadrant. Vascular/Lymphatic: There is advanced aortoiliac atherosclerotic disease. No aneurysmal dilatation or dissection. The SMV, splenic vein, and main portal vein are patent. No portal venous gas identified. No adenopathy. Reproductive: Hysterectomy.  No pelvic mass. Other: Small fat containing bilateral inguinal hernia. Musculoskeletal: Osteopenia with degenerative changes of the spine. New grade 2 L4-L5 anterolisthesis. No acute fracture. IMPRESSION: 1. Severe gaseous distention of the stomach likely related to functional dysmotility/gastroparesis. No definite evidence of mechanical gastric outlet obstruction. Stable appearance of herniation of a large part of the stomach into the left hemithorax. 2. Sigmoid diverticulosis without active  inflammatory changes. No evidence of bowel obstruction. 3. Other nonacute findings as above. Electronically Signed   By: Anner Crete M.D.   On: 11/08/2016 01:03    EKG: Independently reviewed. Normal sinus rhythm with LBBB.  Assessment/Plan Principal Problem:   Abdominal pain Active Problems:   Left bundle branch block   Uncontrolled hypertension   CKD (chronic kidney disease) stage 3, GFR 30-59 ml/min   Primary hypothyroidism   Acute lower UTI   Acute cystitis without hematuria    1. Abdominal pain - cause not clear CAT scan did show severe distention of the stomach concerning for gastroparesis which at this time clinically is improved. We'll recheck labs. I have placed patient on liquid diet and if patient can tolerate will advance. 2. Possible UTI - patient is on ceftriaxone for urine cultures. 3. Hypertension uncontrolled likely from pain - continue home medications and when necessary IV hydralazine. 4. Chronic kidney disease stage III with acute renal failure - recheck labs. If creatinine does not improve may have to hold ARB. 5. Hypothyroidism on Synthroid. 6. Chronic LBBB. 7. 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  I have reviewed patient's old charts and labs.   DVT prophylaxis: SCDs. Code Status: DO NOT RESUSCITATE.  Family Communication: Discussed with patient.  Disposition Plan: Home.  Consults called: None.  Admission status: Observation.    Rise Patience MD [facility] Pager [phone].  If 7PM-7AM, please contact night-coverage [email or web address] Password [email or web address]  11/08/2016, 5:52 AM

## 2016-11-08 NOTE — ED Notes (Addendum)
Cindy [name] [phone] - is patients neighbor and closest caregiver.

## 2016-11-08 NOTE — Progress Notes (Signed)
Pharmacy Antibiotic Note  Cindy [name] is a 83 y.o. female admitted on 11/07/2016 with UTI.  Pharmacy has been consulted for Rocephin dosing.  Plan: Rocephin 1 gm IV q24 Pharmacy to sign off thanks  Height: 5' (152.4 cm) Weight: 114 lb 10.2 oz (52 kg) IBW/kg (Calculated) : 45.5  Temp (24hrs), Avg:98 F (36.7 C), Min:97.8 F (36.6 C), Max:98.4 F (36.9 C)   Recent Labs Lab 11/07/16 2306  WBC 10.2  CREATININE 1.23*    Estimated Creatinine Clearance: 21 mL/min (A) (by C-G formula based on SCr of 1.23 mg/dL (H)).    Allergies  Allergen Reactions  . Fish Allergy Hives  . Sulfa Antibiotics Other (See Comments)    Reaction unknown    Thank you for allowing pharmacy to be a part of this patient's care.  Eudelia Bunch, Pharm.D. [phone] 11/08/2016 5:59 AM

## 2016-11-08 NOTE — Care Management Note (Signed)
Case Management Note  Patient Details  Name: Cindy [name] MRN: [id number] Date of Birth: 1922-08-21  Subjective/Objective:83 y/o f admitted w/abd pain. From home.                    Action/Plan:d/c home.   Expected Discharge Date:                  Expected Discharge Plan:  Home/Self Care  In-House Referral:     Discharge planning Services  CM Consult  Post Acute Care Choice:    Choice offered to:     DME Arranged:    DME Agency:     HH Arranged:    [place] Agency:     Status of Service:  Completed, signed off  If discussed at [employer] of Stay Meetings, dates discussed:    Additional Comments:  Dessa Phi, RN 11/08/2016, 10:13 AM

## 2016-11-08 NOTE — ED Notes (Signed)
Assumed care of patient from [place], RN, pt resting quietly at this time. NO distress. NO pain. Call bell within reach. Awaiting disposition from EDP.

## 2016-11-08 NOTE — ED Notes (Signed)
Pt stable at transfer. No complaints of pain.

## 2016-12-03 ENCOUNTER — Encounter: Payer: Self-pay | Admitting: Cardiovascular Disease

## 2016-12-03 ENCOUNTER — Ambulatory Visit (INDEPENDENT_AMBULATORY_CARE_PROVIDER_SITE_OTHER): Payer: Medicare Other | Admitting: Cardiovascular Disease

## 2016-12-03 DIAGNOSIS — I447 Left bundle-branch block, unspecified: Secondary | ICD-10-CM | POA: Diagnosis not present

## 2016-12-03 DIAGNOSIS — I10 Essential (primary) hypertension: Secondary | ICD-10-CM | POA: Diagnosis not present

## 2016-12-03 DIAGNOSIS — E7800 Pure hypercholesterolemia, unspecified: Secondary | ICD-10-CM

## 2016-12-03 NOTE — Progress Notes (Signed)
12/03/2016 Cindy [name]   Apr 15, 1922  [id number]  Primary Physician Altheimer, Legrand Como, MD Primary Cardiologist: Lorretta Harp MD Lupe Carney, [place]  HPI:  Cindy [name] is a 83 y.o. female  thin and frail appearing married Caucasian female whose husband Cindy [name] was also a patient of mine. Unfortunately show passed away on 04-14-2016 after they have been married for 68 years. She has 2 living children and 6 grandchildren. I last saw her in the office 12/03/15.Marland Kitchen She has a history of normal coronary arteries by cath in 2005 and again in 2007. She has chronic left bundle branch block, hypertension and hyperlipidemia. She also has GERD, followed by Dr. Carol Ada with an endoscopy in the past. [name] followed her lipid profile. Since I saw her back in the office 8/17, she's remained clinically stable specifically denying chest pain or shortness of breath. She is fairly independent and is accompanied by her caregiver who is here for several hours in the morning. She does live alone and continues to drive and to her daily chores.    Current Meds  Medication Sig  . aspirin 81 MG tablet Take 81 mg by mouth daily.  . calcium carbonate (OS-CAL - DOSED IN MG OF ELEMENTAL CALCIUM) 1250 (500 Ca) MG tablet Take 2 tablets by mouth 2 (two) times daily with a meal.  . cephALEXin (KEFLEX) 500 MG capsule Take 1 capsule (500 mg total) by mouth 2 (two) times daily.  Marland Kitchen CRANBERRY PO Take 1 tablet by mouth 3 (three) times daily.  . furosemide (LASIX) 20 MG tablet Take 20 mg by mouth daily.  Marland Kitchen ipratropium (ATROVENT) 0.06 % nasal spray Place 1-2 sprays into both nostrils every 6 (six) hours as needed (rhinorrhea).  Marland Kitchen levothyroxine (SYNTHROID, LEVOTHROID) 75 MCG tablet Take 1 tablet (75 mcg total) by mouth daily before breakfast.  . loratadine (CLARITIN) 10 MG tablet Take 10 mg by mouth daily.  . methocarbamol (ROBAXIN) 500 MG tablet Take 500 mg by mouth every 6 (six) hours as needed for muscle spasms.    . Multiple Vitamin (MULTIVITAMIN) capsule Take 1 capsule by mouth daily.  . Multiple Vitamins-Minerals (ICAPS MV PO) Take 1 capsule by mouth daily.   Marland Kitchen omeprazole (PRILOSEC) 20 MG capsule Take 20 mg by mouth daily.  Marland Kitchen OVER THE COUNTER MEDICATION Take 1 tablet by mouth 2 (two) times daily. Prescription eye vitamins that come in the mail.  . simvastatin (ZOCOR) 40 MG tablet Take 40 mg by mouth every evening.   . valsartan (DIOVAN) 160 MG tablet Take 160 mg by mouth daily.     Allergies  Allergen Reactions  . Fish Allergy Hives  . Sulfa Antibiotics     Unknown reaction    Social History   Social History  . Marital status: Married    Spouse name: N/A  . Number of children: N/A  . Years of education: N/A   Occupational History  . Not on file.   Social History Main Topics  . Smoking status: Never Smoker  . Smokeless tobacco: Never Used  . Alcohol use No  . Drug use: No  . Sexual activity: Not on file   Other Topics Concern  . Not on file   Social History Narrative  . No narrative on file     Review of Systems: General: negative for chills, fever, night sweats or weight changes.  Cardiovascular: negative for chest pain, dyspnea on exertion, edema, orthopnea, palpitations, paroxysmal nocturnal dyspnea or shortness of breath  Dermatological: negative for rash Respiratory: negative for cough or wheezing Urologic: negative for hematuria Abdominal: negative for nausea, vomiting, diarrhea, bright red blood per rectum, melena, or hematemesis Neurologic: negative for visual changes, syncope, or dizziness All other systems reviewed and are otherwise negative except as noted above.    Blood pressure (!) 148/71, pulse (!) 56, height 5' (1.524 m), weight 115 lb 12.8 oz (52.5 kg), SpO2 95 %.  General appearance: alert and no distress Neck: no adenopathy, no carotid bruit, no JVD, supple, symmetrical, trachea midline and thyroid not enlarged, symmetric, no  tenderness/mass/nodules Lungs: clear to auscultation bilaterally Heart: regular rate and rhythm, S1, S2 normal, no murmur, click, rub or gallop Extremities: extremities normal, atraumatic, no cyanosis or edema  EKG not performed today  ASSESSMENT AND PLAN:   Left bundle branch block Chronic  Essential hypertension History of essential hypertension blood pressure measures 148/71. She is on Diovan. Continue current meds at current dosing  Hyperlipidemia History of hyperlipidemia on statin therapy followed by her PCP      Lorretta Harp MD [facility], [facility] 12/03/2016 1:56 PM

## 2016-12-03 NOTE — Assessment & Plan Note (Signed)
History of hyperlipidemia on statin therapy followed by her PCP. 

## 2016-12-03 NOTE — Patient Instructions (Signed)

## 2016-12-03 NOTE — Assessment & Plan Note (Signed)
History of essential hypertension blood pressure measures 148/71. She is on Diovan. Continue current meds at current dosing

## 2016-12-03 NOTE — Assessment & Plan Note (Signed)
Chronic. 

## 2017-03-18 ENCOUNTER — Encounter: Payer: Self-pay | Admitting: Podiatry

## 2017-03-18 ENCOUNTER — Ambulatory Visit (INDEPENDENT_AMBULATORY_CARE_PROVIDER_SITE_OTHER): Payer: Medicare Other

## 2017-03-18 ENCOUNTER — Ambulatory Visit (INDEPENDENT_AMBULATORY_CARE_PROVIDER_SITE_OTHER): Payer: Medicare Other | Admitting: Podiatry

## 2017-03-18 DIAGNOSIS — M722 Plantar fascial fibromatosis: Secondary | ICD-10-CM

## 2017-03-18 MED ORDER — DICLOFENAC SODIUM 1 % TD GEL: 2.0000 g | Freq: Four times a day (QID) | TRANSDERMAL | 2 refills | Status: DC

## 2017-03-18 NOTE — Progress Notes (Signed)
   Subjective:    Patient ID: Cindy [name], female    DOB: 1923/01/27, 83 y.o.   MRN: [id number]  HPI  83 year old female presents the office today for concerns of right heel pain which is been ongoing for about 1 month.  She states that she gets pain with pressure to the area but she denies any recent injury or trauma at the time that the pain started.  She has not had any swelling or redness to the area she denies any numbness or tingling.  She states that it hurts worse after she is been on her feet walking for some time.  She has no pain in the morning when she first gets up.  She did recently fall injuring her right shoulder last weekend and has bene seen for this.  She has no other lower extremity concerns.   Review of Systems  All other systems reviewed and are negative.      Objective:   Physical Exam  General: AAO x3, NAD  Dermatological: Skin is warm, dry and supple bilateral. Nails x 10 are well manicured; remaining integument appears unremarkable at this time. There are no open sores, no preulcerative lesions, no rash or signs of infection present.  Vascular: Dorsalis Pedis artery and Posterior Tibial artery pedal pulses are 2/4 bilateral with immedate capillary fill time. Pedal hair growth present.  There is no pain with calf compression, swelling, warmth, erythema.   Neruologic: Grossly intact via light touch bilateral. Protective threshold with Semmes Wienstein monofilament intact to all pedal sites bilateral. Negative tinel sign.  Musculoskeletal: There is mild enderness to palpation along the plantar medial tubercle of the calcaneus at the insertion of plantar fascia on the right foot. There is no pain along the course of the plantar fascia within the arch of the foot. Plantar fascia appears to be intact. There is no pain with lateral compression of the calcaneus or pain with vibratory sensation. There is no pain along the course or insertion of the achilles tendon. No other  areas of tenderness to bilateral lower extremities.Muscular strength 5/5 in all groups tested bilateral.  Gait: Unassisted, Nonantalgic.     Assessment & Plan:  83 year old female with right heel pain likely plantar fasciitis -Treatment options discussed including all alternatives, risks, and complications -Etiology of symptoms were discussed -X-rays were obtained and reviewed with the patient.  No evidence of acute fracture identified. -Voltaren gel -Plantar fascial brace dispensed -Discussed stretching and icing exercises daily -Discussed shoe gear modifications and inserts for her shoes.  -Follow-up as scheduled or sooner if needed.  Trula Slade DPM

## 2017-03-18 NOTE — Patient Instructions (Signed)

## 2017-04-15 ENCOUNTER — Ambulatory Visit (INDEPENDENT_AMBULATORY_CARE_PROVIDER_SITE_OTHER): Payer: Medicare Other | Admitting: Podiatry

## 2017-04-15 ENCOUNTER — Encounter: Payer: Self-pay | Admitting: Podiatry

## 2017-04-15 DIAGNOSIS — M722 Plantar fascial fibromatosis: Secondary | ICD-10-CM

## 2017-04-17 NOTE — Progress Notes (Signed)
Subjective: Renad presents the office today for follow-up evaluation of right heel pain.  She states that since I last saw her pain is resolved and she no longer has any pain but they want to come in today to make sure that everything is going well.  She denies any swelling or redness to the area she denies any recent injury or trauma.  She has no other concerns today.  She states the Voltaren gel did help but she only uses a couple of times and she no longer needed it.  Denies any systemic complaints such as fevers, chills, nausea, vomiting. No acute changes since last appointment, and no other complaints at this time.   Objective: AAO x3, NAD DP/PT pulses palpable bilaterally, CRT less than 3 seconds At this time there is no tenderness palpation to bilateral lower extremities specifically there is no tenderness palpation on the plantar medial tubercle of the calcaneus at the insertion of plantar fascia on the right side.  No pain with lateral compression of the calcaneus.  No overlying edema, erythema, increase in warmth. No open lesions or pre-ulcerative lesions.  No pain with calf compression, swelling, warmth, erythema  Assessment: Resolved right heel pain, plantar fasciitis  Plan: -All treatment options discussed with the patient including all alternatives, risks, complications.  -At this time she is having no pain.  She want her to continue with stretching exercises as well as wearing supportive shoes to help prevent recurrence and she agrees this plan.  Discussed there is any reoccurrence to call the office. -Patient encouraged to call the office with any questions, concerns, change in symptoms.   Trula Slade DPM

## 2017-12-06 ENCOUNTER — Ambulatory Visit: Payer: Medicare Other | Admitting: Cardiovascular Disease

## 2017-12-26 ENCOUNTER — Telehealth: Payer: Self-pay | Admitting: Cardiovascular Disease

## 2017-12-26 NOTE — Telephone Encounter (Signed)
Received records from [facility] on 12/26/17, Appt 01/30/18 @ 10:45AM.[place]

## 2018-01-11 ENCOUNTER — Encounter: Payer: Self-pay | Admitting: Cardiovascular Disease

## 2018-01-11 ENCOUNTER — Ambulatory Visit (INDEPENDENT_AMBULATORY_CARE_PROVIDER_SITE_OTHER): Payer: Medicare Other | Admitting: Cardiovascular Disease

## 2018-01-11 VITALS — BP 128/72 | HR 64 | Ht 60.0 in | Wt 113.6 lb

## 2018-01-11 DIAGNOSIS — I447 Left bundle-branch block, unspecified: Secondary | ICD-10-CM

## 2018-01-11 DIAGNOSIS — I10 Essential (primary) hypertension: Secondary | ICD-10-CM

## 2018-01-11 DIAGNOSIS — E7800 Pure hypercholesterolemia, unspecified: Secondary | ICD-10-CM

## 2018-01-11 NOTE — Progress Notes (Signed)
01/11/2018 Cindy [name]   1922/05/21  [id number]  Primary Physician Altheimer, Cindy Como, MD Primary Cardiologist: Lorretta Harp MD Lupe Carney, [place]  HPI:  Cindy [name] is a 84 y.o.  thin and frail appearing married Caucasian female whose husband Cindy [name] was also a patient of mine. Unfortunately show passed away on 04/04/2016 after they have been married for 68 years. She has 2 living children and 6 grandchildren. I last saw her in the office  12/03/2016.Marland Kitchen She has a history of normal coronary arteries by cath in 2005 and again in 2007. She has chronic left bundle branch block, hypertension and hyperlipidemia. She also has GERD, followed by Dr. Carol Ada with an endoscopy in the past. [name] followed her lipid profile. She is fairly independent and is accompanied by her caregiver who is here for several hours in the morning. She does live alone and continues to drive and to her daily chores. Since I saw her a year ago she is remained stable.  She denies chest pain or shortness of breath.  Current Meds  Medication Sig  . aspirin 81 MG tablet Take 81 mg by mouth daily.  . calcium carbonate (OS-CAL - DOSED IN MG OF ELEMENTAL CALCIUM) 1250 (500 Ca) MG tablet Take 2 tablets by mouth 2 (two) times daily with a meal.  . CRANBERRY PO Take 1 tablet by mouth 3 (three) times daily.  . diclofenac sodium (VOLTAREN) 1 % GEL Apply 2 g topically 4 (four) times daily. Rub into affected area of foot 2 to 4 times daily  . furosemide (LASIX) 20 MG tablet Take 20 mg by mouth daily.  Marland Kitchen ipratropium (ATROVENT) 0.06 % nasal spray Place 1-2 sprays into both nostrils every 6 (six) hours as needed (rhinorrhea).  Marland Kitchen levothyroxine (SYNTHROID, LEVOTHROID) 75 MCG tablet Take 1 tablet (75 mcg total) by mouth daily before breakfast.  . loratadine (CLARITIN) 10 MG tablet Take 10 mg by mouth daily.  . Multiple Vitamin (MULTIVITAMIN) capsule Take 1 capsule by mouth daily.  . Multiple Vitamins-Minerals (ICAPS MV  PO) Take 1 capsule by mouth daily.   Marland Kitchen olmesartan (BENICAR) 40 MG tablet olmesartan 40 mg tablet  TK 1 T PO D FOR BP  . omeprazole (PRILOSEC) 20 MG capsule Take 20 mg by mouth daily.  Marland Kitchen OVER THE COUNTER MEDICATION Take 1 tablet by mouth 2 (two) times daily. Prescription eye vitamins that come in the mail.  . simvastatin (ZOCOR) 40 MG tablet Take 40 mg by mouth every evening.   . [DISCONTINUED] cephALEXin (KEFLEX) 500 MG capsule Take 1 capsule (500 mg total) by mouth 2 (two) times daily.  . [DISCONTINUED] methocarbamol (ROBAXIN) 500 MG tablet Take 500 mg by mouth every 6 (six) hours as needed for muscle spasms.  . [DISCONTINUED] valsartan (DIOVAN) 160 MG tablet Take 160 mg by mouth daily.     Allergies  Allergen Reactions  . Fish Allergy Hives  . Sulfa Antibiotics     Unknown reaction    Social History   Socioeconomic History  . Marital status: Married    Spouse name: Not on file  . Number of children: Not on file  . Years of education: Not on file  . Highest education level: Not on file  Occupational History  . Not on file  Social Needs  . Financial resource strain: Not on file  . Food insecurity:    Worry: Not on file    Inability: Not on file  . Transportation needs:  Medical: Not on file    Non-medical: Not on file  Tobacco Use  . Smoking status: Never Smoker  . Smokeless tobacco: Never Used  Substance and Sexual Activity  . Alcohol use: No  . Drug use: No  . Sexual activity: Not on file  Lifestyle  . Physical activity:    Days per week: Not on file    Minutes per session: Not on file  . Stress: Not on file  Relationships  . Social connections:    Talks on phone: Not on file    Gets together: Not on file    Attends religious service: Not on file    Active member of club or organization: Not on file    Attends meetings of clubs or organizations: Not on file    Relationship status: Not on file  . Intimate partner violence:    Fear of current or ex partner:  Not on file    Emotionally abused: Not on file    Physically abused: Not on file    Forced sexual activity: Not on file  Other Topics Concern  . Not on file  Social History Narrative  . Not on file     Review of Systems: General: negative for chills, fever, night sweats or weight changes.  Cardiovascular: negative for chest pain, dyspnea on exertion, edema, orthopnea, palpitations, paroxysmal nocturnal dyspnea or shortness of breath Dermatological: negative for rash Respiratory: negative for cough or wheezing Urologic: negative for hematuria Abdominal: negative for nausea, vomiting, diarrhea, bright red blood per rectum, melena, or hematemesis Neurologic: negative for visual changes, syncope, or dizziness All other systems reviewed and are otherwise negative except as noted above.    Blood pressure 128/72, pulse 64, height 5' (1.524 m), weight 113 lb 9.6 oz (51.5 kg).  General appearance: alert and no distress Neck: no adenopathy, no carotid bruit, no JVD, supple, symmetrical, trachea midline and thyroid not enlarged, symmetric, no tenderness/mass/nodules Lungs: clear to auscultation bilaterally Heart: regular rate and rhythm, S1, S2 normal, no murmur, click, rub or gallop Extremities: extremities normal, atraumatic, no cyanosis or edema Pulses: 2+ and symmetric Skin: Skin color, texture, turgor normal. No rashes or lesions Neurologic: Alert and oriented X 3, normal strength and tone. Normal symmetric reflexes. Normal coordination and gait  EKG sinus rhythm at 64 with left bundle branch block.  I personally reviewed this EKG.  ASSESSMENT AND PLAN:   Left bundle branch block Chronic  Essential hypertension History of essential hypertension her blood pressure measured at 128/72.  She is on Benicar.  Continue current meds at current dosing.  Hyperlipidemia History of hyperlipidemia on statin therapy.      Lorretta Harp MD FACP,FACC,FAHA, [facility] 01/11/2018 10:54 AM

## 2018-01-11 NOTE — Assessment & Plan Note (Signed)
History of hyperlipidemia on statin therapy. 

## 2018-01-11 NOTE — Assessment & Plan Note (Signed)
History of essential hypertension her blood pressure measured at 128/72.  She is on Benicar.  Continue current meds at current dosing.

## 2018-01-11 NOTE — Assessment & Plan Note (Signed)
Chronic. 

## 2018-01-11 NOTE — Patient Instructions (Signed)

## 2018-05-14 IMAGING — DX DG ABD PORTABLE 1V
1 series · 1 of 1 positions shown · non-contrast
Comparison: CT abdomen pelvis 06/27/2016

Abdominal radiograph 06/27/2016

CLINICAL DATA: NG tube placement

EXAM:
PORTABLE ABDOMEN - 1 VIEW

[abdomen kub]
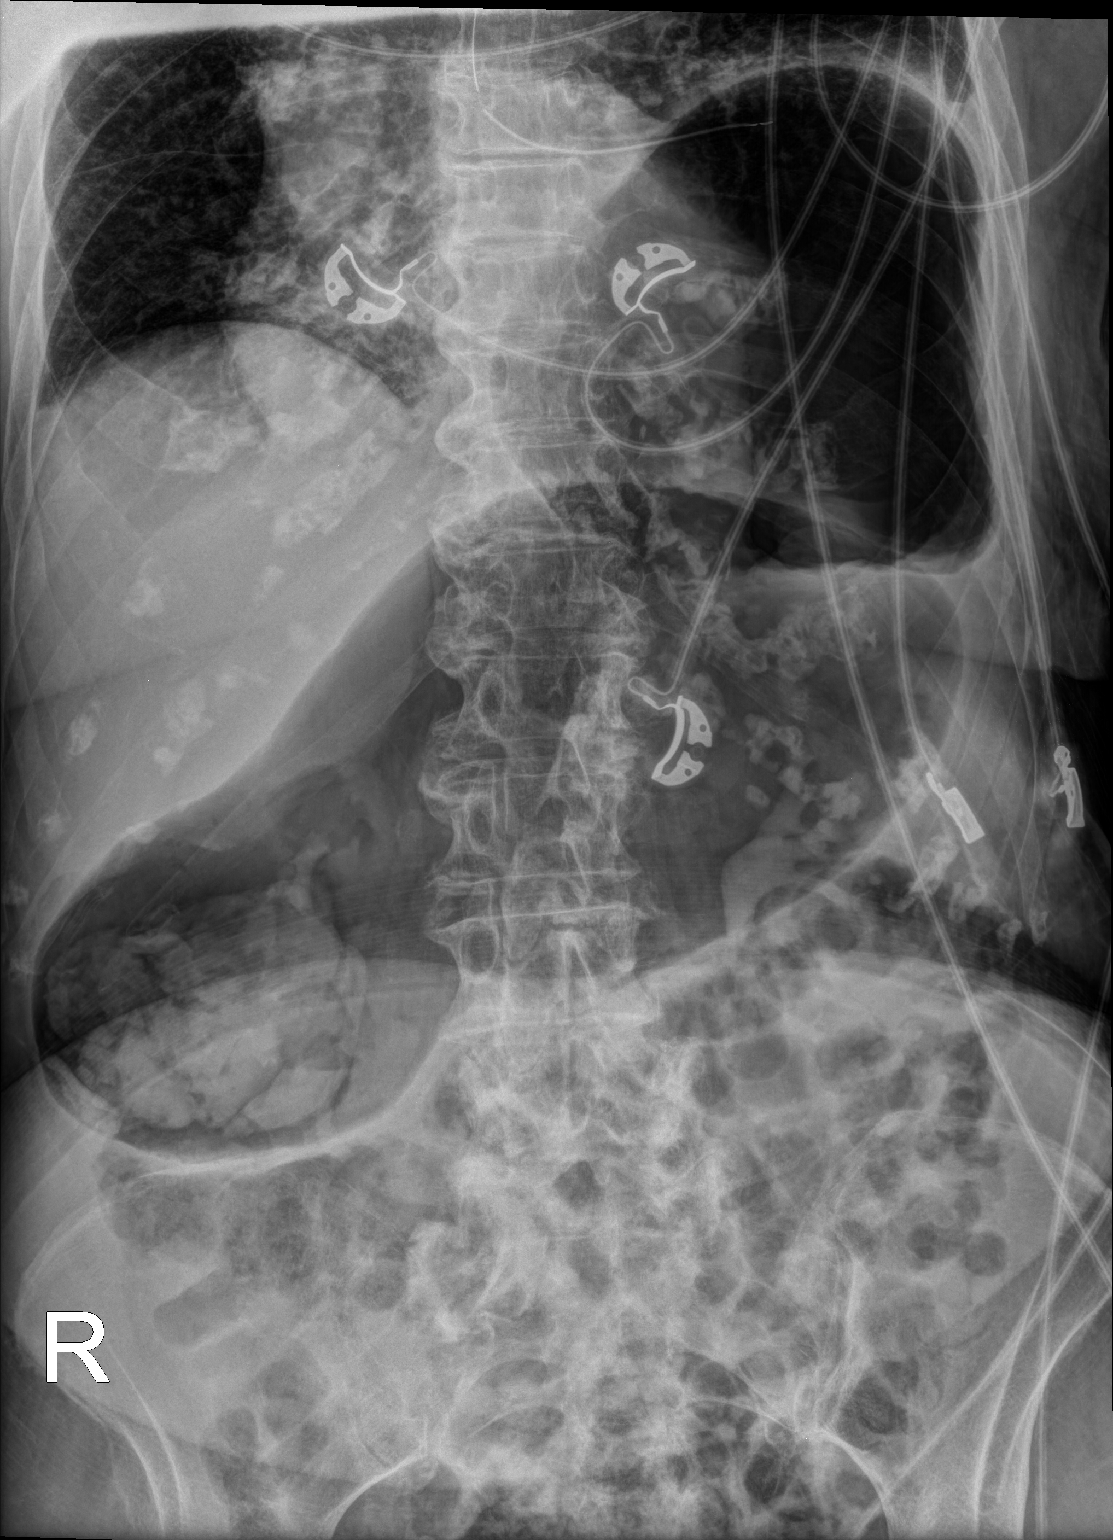

[1 of 1 positions shown; findings below may reference images not displayed]

FINDINGS: The tip of the nasogastric tube overlies the fundus of the gas
distended, partially intrathoracic stomach. The side port is in the
lower esophagus. The tube should be advanced by at least 7 cm.
IMPRESSION: NG tube side port in the lower esophagus. Recommend advancement by 7
cm.

## 2018-06-11 DIAGNOSIS — J189 Pneumonia, unspecified organism: Secondary | ICD-10-CM

## 2018-06-11 HISTORY — DX: Pneumonia, unspecified organism: J18.9

## 2018-06-13 ENCOUNTER — Inpatient Hospital Stay (HOSPITAL_COMMUNITY)
Admission: EM | Admit: 2018-06-13 | Discharge: 2018-06-16 | DRG: 193 | Disposition: A | Discharge status: Home Health | Payer: Medicare Other | Attending: Internal Medicine | Admitting: Internal Medicine

## 2018-06-13 ENCOUNTER — Other Ambulatory Visit: Payer: Self-pay

## 2018-06-13 ENCOUNTER — Encounter (HOSPITAL_COMMUNITY): Payer: Self-pay

## 2018-06-13 ENCOUNTER — Emergency Department (HOSPITAL_COMMUNITY): Payer: Medicare Other

## 2018-06-13 DIAGNOSIS — J13 Pneumonia due to Streptococcus pneumoniae: Secondary | ICD-10-CM | POA: Diagnosis not present

## 2018-06-13 DIAGNOSIS — Z882 Allergy status to sulfonamides status: Secondary | ICD-10-CM

## 2018-06-13 DIAGNOSIS — E039 Hypothyroidism, unspecified: Secondary | ICD-10-CM | POA: Diagnosis present

## 2018-06-13 DIAGNOSIS — I10 Essential (primary) hypertension: Secondary | ICD-10-CM | POA: Diagnosis not present

## 2018-06-13 DIAGNOSIS — Z9079 Acquired absence of other genital organ(s): Secondary | ICD-10-CM

## 2018-06-13 DIAGNOSIS — Z91013 Allergy to seafood: Secondary | ICD-10-CM

## 2018-06-13 DIAGNOSIS — E785 Hyperlipidemia, unspecified: Secondary | ICD-10-CM | POA: Diagnosis present

## 2018-06-13 DIAGNOSIS — R001 Bradycardia, unspecified: Secondary | ICD-10-CM | POA: Diagnosis present

## 2018-06-13 DIAGNOSIS — R011 Cardiac murmur, unspecified: Secondary | ICD-10-CM | POA: Diagnosis present

## 2018-06-13 DIAGNOSIS — N183 Chronic kidney disease, stage 3 (moderate): Secondary | ICD-10-CM

## 2018-06-13 DIAGNOSIS — Z90722 Acquired absence of ovaries, bilateral: Secondary | ICD-10-CM | POA: Diagnosis not present

## 2018-06-13 DIAGNOSIS — N179 Acute kidney failure, unspecified: Secondary | ICD-10-CM | POA: Diagnosis present

## 2018-06-13 DIAGNOSIS — I129 Hypertensive chronic kidney disease with stage 1 through stage 4 chronic kidney disease, or unspecified chronic kidney disease: Secondary | ICD-10-CM | POA: Diagnosis present

## 2018-06-13 DIAGNOSIS — Z90710 Acquired absence of both cervix and uterus: Secondary | ICD-10-CM | POA: Diagnosis not present

## 2018-06-13 DIAGNOSIS — J9601 Acute respiratory failure with hypoxia: Secondary | ICD-10-CM | POA: Diagnosis present

## 2018-06-13 DIAGNOSIS — Z79899 Other long term (current) drug therapy: Secondary | ICD-10-CM

## 2018-06-13 DIAGNOSIS — K219 Gastro-esophageal reflux disease without esophagitis: Secondary | ICD-10-CM | POA: Diagnosis present

## 2018-06-13 DIAGNOSIS — M810 Age-related osteoporosis without current pathological fracture: Secondary | ICD-10-CM | POA: Diagnosis present

## 2018-06-13 DIAGNOSIS — I447 Left bundle-branch block, unspecified: Secondary | ICD-10-CM | POA: Diagnosis present

## 2018-06-13 DIAGNOSIS — J189 Pneumonia, unspecified organism: Principal | ICD-10-CM | POA: Diagnosis present

## 2018-06-13 DIAGNOSIS — Z85820 Personal history of malignant melanoma of skin: Secondary | ICD-10-CM

## 2018-06-13 DIAGNOSIS — Z7982 Long term (current) use of aspirin: Secondary | ICD-10-CM | POA: Diagnosis not present

## 2018-06-13 DIAGNOSIS — L8990 Pressure ulcer of unspecified site, unspecified stage: Secondary | ICD-10-CM

## 2018-06-13 DIAGNOSIS — J181 Lobar pneumonia, unspecified organism: Secondary | ICD-10-CM

## 2018-06-13 DIAGNOSIS — R0602 Shortness of breath: Secondary | ICD-10-CM | POA: Diagnosis not present

## 2018-06-13 DIAGNOSIS — Z79890 Hormone replacement therapy (postmenopausal): Secondary | ICD-10-CM | POA: Diagnosis not present

## 2018-06-13 DIAGNOSIS — E782 Mixed hyperlipidemia: Secondary | ICD-10-CM | POA: Diagnosis present

## 2018-06-13 DIAGNOSIS — L8992 Pressure ulcer of unspecified site, stage 2: Secondary | ICD-10-CM | POA: Diagnosis present

## 2018-06-13 DIAGNOSIS — N184 Chronic kidney disease, stage 4 (severe): Secondary | ICD-10-CM | POA: Diagnosis present

## 2018-06-13 DIAGNOSIS — H3530 Unspecified macular degeneration: Secondary | ICD-10-CM | POA: Diagnosis present

## 2018-06-13 LAB — COMPREHENSIVE METABOLIC PANEL
ALT: 16 U/L (ref 0–44)
AST: 30 U/L (ref 15–41)
Albumin: 3.8 g/dL (ref 3.5–5.0)
Alkaline Phosphatase: 49 U/L (ref 38–126)
Anion gap: 10 (ref 5–15)
BUN: 51 mg/dL — ABNORMAL HIGH (ref 8–23)
CO2: 21 mmol/L — ABNORMAL LOW (ref 22–32)
Calcium: 9.4 mg/dL (ref 8.9–10.3)
Chloride: 107 mmol/L (ref 98–111)
Creatinine, Ser: 2.06 mg/dL — ABNORMAL HIGH (ref 0.44–1.00)
GFR calc Af Amer: 23 mL/min — ABNORMAL LOW (ref >60)
GFR calc non Af Amer: 20 mL/min — ABNORMAL LOW (ref >60)
Glucose, Bld: 108 mg/dL — ABNORMAL HIGH (ref 70–99)
Potassium: 4.1 mmol/L (ref 3.5–5.1)
Sodium: 138 mmol/L (ref 135–145)
Total Bilirubin: 0.8 mg/dL (ref 0.3–1.2)
Total Protein: 7.2 g/dL (ref 6.5–8.1)

## 2018-06-13 LAB — CBC
HCT: 35.5 % — ABNORMAL LOW (ref 36.0–46.0)
Hemoglobin: 11 g/dL — ABNORMAL LOW (ref 12.0–15.0)
MCH: 32.1 pg (ref 26.0–34.0)
MCHC: 31 g/dL (ref 30.0–36.0)
MCV: 103.5 fL — ABNORMAL HIGH (ref 80.0–100.0)
Platelets: 186 10*3/uL (ref 150–400)
RBC: 3.43 MIL/uL — ABNORMAL LOW (ref 3.87–5.11)
RDW: 13.8 % (ref 11.5–15.5)
WBC: 9.9 10*3/uL (ref 4.0–10.5)
nRBC: 0 % (ref 0.0–0.2)

## 2018-06-13 LAB — CBC WITH DIFFERENTIAL/PLATELET
Abs Immature Granulocytes: 0.04 10*3/uL (ref 0.00–0.07)
Basophils Absolute: 0 10*3/uL (ref 0.0–0.1)
Basophils Relative: 0 %
Eosinophils Absolute: 0 10*3/uL (ref 0.0–0.5)
Eosinophils Relative: 0 %
HCT: 40 % (ref 36.0–46.0)
Hemoglobin: 12.3 g/dL (ref 12.0–15.0)
Immature Granulocytes: 0 %
Lymphocytes Relative: 8 %
Lymphs Abs: 1 10*3/uL (ref 0.7–4.0)
MCH: 31.1 pg (ref 26.0–34.0)
MCHC: 30.8 g/dL (ref 30.0–36.0)
MCV: 101.3 fL — ABNORMAL HIGH (ref 80.0–100.0)
Monocytes Absolute: 1.1 10*3/uL — ABNORMAL HIGH (ref 0.1–1.0)
Monocytes Relative: 8 %
Neutro Abs: 10.4 10*3/uL — ABNORMAL HIGH (ref 1.7–7.7)
Neutrophils Relative %: 84 %
Platelets: 215 10*3/uL (ref 150–400)
RBC: 3.95 MIL/uL (ref 3.87–5.11)
RDW: 13.6 % (ref 11.5–15.5)
WBC: 12.6 10*3/uL — ABNORMAL HIGH (ref 4.0–10.5)
nRBC: 0 % (ref 0.0–0.2)

## 2018-06-13 LAB — CREATININE, SERUM
Creatinine, Ser: 1.72 mg/dL — ABNORMAL HIGH (ref 0.44–1.00)
GFR calc Af Amer: 29 mL/min — ABNORMAL LOW (ref >60)
GFR calc non Af Amer: 25 mL/min — ABNORMAL LOW (ref >60)

## 2018-06-13 LAB — INFLUENZA PANEL BY PCR (TYPE A & B)
Influenza A By PCR: NEGATIVE
Influenza B By PCR: NEGATIVE

## 2018-06-13 LAB — LACTIC ACID, PLASMA
Lactic Acid, Venous: 1.3 mmol/L (ref 0.5–1.9)
Lactic Acid, Venous: 1.5 mmol/L (ref 0.5–1.9)

## 2018-06-13 MED ORDER — HEPARIN SODIUM (PORCINE) 5000 UNIT/ML IJ SOLN
5000.0000 [IU] | Freq: Three times a day (TID) | INTRAMUSCULAR | Status: DC
  Administered 2018-06-13 – 2018-06-16 (×8): 5000 [IU] via SUBCUTANEOUS
  Filled 2018-06-13 (×8): qty 1

## 2018-06-13 MED ORDER — SODIUM CHLORIDE 0.9 % IV BOLUS
1000.0000 mL | Freq: Once | INTRAVENOUS | Status: AC
  Administered 2018-06-13: 1000 mL via INTRAVENOUS

## 2018-06-13 MED ORDER — PROSIGHT PO TABS
1.0000 | ORAL_TABLET | Freq: Every day | ORAL | Status: DC
  Administered 2018-06-13 – 2018-06-16 (×4): 1 via ORAL
  Filled 2018-06-13 (×3): qty 1

## 2018-06-13 MED ORDER — CLOBETASOL PROPIONATE 0.05 % EX CREA
1.0000 "application " | TOPICAL_CREAM | Freq: Two times a day (BID) | CUTANEOUS | Status: DC
  Administered 2018-06-15: 1 via TOPICAL
  Filled 2018-06-13: qty 15

## 2018-06-13 MED ORDER — SODIUM CHLORIDE 0.9 % IV SOLN
2.0000 g | INTRAVENOUS | Status: DC
  Administered 2018-06-13: 2 g via INTRAVENOUS
  Filled 2018-06-13 (×2): qty 20

## 2018-06-13 MED ORDER — FUROSEMIDE 20 MG PO TABS
20.0000 mg | ORAL_TABLET | Freq: Every day | ORAL | Status: DC
  Administered 2018-06-14 – 2018-06-16 (×3): 20 mg via ORAL
  Filled 2018-06-13 (×3): qty 1

## 2018-06-13 MED ORDER — ASPIRIN EC 81 MG PO TBEC
81.0000 mg | DELAYED_RELEASE_TABLET | Freq: Every day | ORAL | Status: DC
  Administered 2018-06-14 – 2018-06-16 (×3): 81 mg via ORAL
  Filled 2018-06-13 (×3): qty 1

## 2018-06-13 MED ORDER — IPRATROPIUM BROMIDE 0.06 % NA SOLN
1.0000 | Freq: Four times a day (QID) | NASAL | Status: DC | PRN
  Filled 2018-06-13: qty 15

## 2018-06-13 MED ORDER — IPRATROPIUM-ALBUTEROL 0.5-2.5 (3) MG/3ML IN SOLN
3.0000 mL | Freq: Once | RESPIRATORY_TRACT | Status: AC
  Administered 2018-06-13: 3 mL via RESPIRATORY_TRACT
  Filled 2018-06-13: qty 3

## 2018-06-13 MED ORDER — IRBESARTAN 150 MG PO TABS
300.0000 mg | ORAL_TABLET | Freq: Every day | ORAL | Status: DC
  Administered 2018-06-14 – 2018-06-16 (×3): 300 mg via ORAL
  Filled 2018-06-13 (×3): qty 2

## 2018-06-13 MED ORDER — ADULT MULTIVITAMIN W/MINERALS CH
1.0000 | ORAL_TABLET | Freq: Every day | ORAL | Status: DC
  Administered 2018-06-14 – 2018-06-16 (×3): 1 via ORAL
  Filled 2018-06-13 (×4): qty 1

## 2018-06-13 MED ORDER — SODIUM CHLORIDE 0.9 % IV SOLN
500.0000 mg | INTRAVENOUS | Status: DC
  Administered 2018-06-14 – 2018-06-15 (×2): 500 mg via INTRAVENOUS
  Filled 2018-06-13 (×2): qty 500

## 2018-06-13 MED ORDER — LEVOTHYROXINE SODIUM 75 MCG PO TABS
75.0000 ug | ORAL_TABLET | Freq: Every day | ORAL | Status: DC
  Administered 2018-06-14 – 2018-06-16 (×3): 75 ug via ORAL
  Filled 2018-06-13 (×3): qty 1

## 2018-06-13 MED ORDER — SODIUM CHLORIDE 0.9 % IV SOLN
1.0000 g | INTRAVENOUS | Status: DC
  Administered 2018-06-14 – 2018-06-15 (×2): 1 g via INTRAVENOUS
  Filled 2018-06-13 (×2): qty 1

## 2018-06-13 MED ORDER — GUAIFENESIN-DM 100-10 MG/5ML PO SYRP
5.0000 mL | ORAL_SOLUTION | ORAL | Status: DC | PRN
  Administered 2018-06-13 – 2018-06-14 (×3): 5 mL via ORAL
  Filled 2018-06-13 (×4): qty 10

## 2018-06-13 MED ORDER — MULTIVITAMINS PO CAPS: 1.0000 | ORAL_CAPSULE | Freq: Every day | ORAL | Status: DC

## 2018-06-13 MED ORDER — CRANBERRY 250 MG PO TABS: ORAL_TABLET | Freq: Every day | ORAL | Status: DC

## 2018-06-13 MED ORDER — LORATADINE 10 MG PO TABS
10.0000 mg | ORAL_TABLET | Freq: Every day | ORAL | Status: DC
  Administered 2018-06-14 – 2018-06-16 (×3): 10 mg via ORAL
  Filled 2018-06-13 (×3): qty 1

## 2018-06-13 MED ORDER — SODIUM CHLORIDE 0.9 % IV SOLN
500.0000 mg | Freq: Once | INTRAVENOUS | Status: AC
  Administered 2018-06-13: 500 mg via INTRAVENOUS
  Filled 2018-06-13: qty 500

## 2018-06-13 MED ORDER — MIRABEGRON ER 25 MG PO TB24
50.0000 mg | ORAL_TABLET | Freq: Every day | ORAL | Status: DC
  Administered 2018-06-14 – 2018-06-16 (×3): 50 mg via ORAL
  Filled 2018-06-13 (×3): qty 2

## 2018-06-13 MED ORDER — PANTOPRAZOLE SODIUM 40 MG PO TBEC
40.0000 mg | DELAYED_RELEASE_TABLET | Freq: Every day | ORAL | Status: DC
  Administered 2018-06-14 – 2018-06-16 (×3): 40 mg via ORAL
  Filled 2018-06-13 (×3): qty 1

## 2018-06-13 MED ORDER — SODIUM CHLORIDE 0.9 % IV SOLN
INTRAVENOUS | Status: DC
  Administered 2018-06-13 – 2018-06-14 (×2): via INTRAVENOUS

## 2018-06-13 MED ORDER — SODIUM CHLORIDE 0.9 % IV BOLUS
500.0000 mL | Freq: Once | INTRAVENOUS | Status: AC
  Administered 2018-06-13: 500 mL via INTRAVENOUS

## 2018-06-13 MED ORDER — ICAPS MV PO TABS: ORAL_TABLET | Freq: Every day | ORAL | Status: DC

## 2018-06-13 MED ORDER — SODIUM CHLORIDE 0.9 % IV SOLN
500.0000 mg | INTRAVENOUS | Status: DC
  Filled 2018-06-13 (×2): qty 500

## 2018-06-13 MED ORDER — SIMVASTATIN 40 MG PO TABS
40.0000 mg | ORAL_TABLET | Freq: Every evening | ORAL | Status: DC
  Administered 2018-06-13 – 2018-06-15 (×3): 40 mg via ORAL
  Filled 2018-06-13 (×3): qty 1

## 2018-06-13 NOTE — ED Notes (Signed)
Report given by day shift nurse. Pt transported to floor via transport services in no acute distress or pain at this time.

## 2018-06-13 NOTE — ED Triage Notes (Addendum)
Patient arrived via [facility] from Urgent Care. Patient was their with care taker who is otw.   C/o cough that started yesterday,  Rhonchi in right upper lobes.    BP-96/60 BP at urgent care and stated patient was wobbly on her feet and worried about hypotension.   Patient does not feel like she is week  A/Ox4.   Denies n/v, fever, or chills.   Only complaint cough with intermittent shob.    93% RA 97% on 2 litters.    Orthostatics negative with ems.

## 2018-06-13 NOTE — H&P (Addendum)
History and Physical   Cindy [name] TJQ:[id number] DOB: 11-04-1922 DOA: 06/13/2018  Referring MD/NP/PA: Dr. Eulis Foster  PCP: Altheimer, Legrand Como, MD   Patient coming from: Home  Chief Complaint: Shortness of breath and cough  HPI: Cindy [name] is a 85 y.o. female with medical history significant of hypertension, chronic kidney disease stage III, GERD, hypothyroidism who was brought in from home secondary to cough shortness of breath and low-grade temperature.  Symptoms have been going on since last night.  Associated with productive sputum.  Sputum was white.  No nausea or vomiting.  Patient felt dizzy and clammy today.  She was seen in the ER and evaluated.  Denied any sick contacts.  Patient denied any recent change in medications.  Evaluation in the ER showed evidence of pneumonia and she is being admitted to the hospitalist service..  ED Course: Temperature 97.5 blood pressure 146/66 pulse 52 respiratory rate of 26 oxygen sats 97% room air.  Creatinine is 1.72.  White count 9.9 and hemoglobin 11.0.  Influenza assay is negative.  Chest x-ray showed right mid and lower lobe pneumonia.  Patient initiated on IV antibiotics and being admitted to the hospital.  Review of Systems: As per HPI otherwise 10 point review of systems negative.    Past Medical History:  Diagnosis Date  . Chronic renal insufficiency, stage III (moderate) (HCC)   . Dyslipidemia   . GERD (gastroesophageal reflux disease)   . Hiatal hernia   . History of nuclear stress test 2005   persantine; inferoapical ischmeia with EF 83%  . Hypertension   . Hypothyroidism   . LBBB (left bundle branch block)   . Lung nodule 07/01/03   right middle lobe  . Macular degeneration   . Malignant melanoma ([place]) 11/12/04   L upper arm  . Murmur, cardiac   . Osteoporosis     Past Surgical History:  Procedure Laterality Date  . BLADDER REPAIR    . CARDIAC CATHETERIZATION  2005/2007   normal coronaries   . CATARACT EXTRACTION     bilat  . coccyx  1951   excision  . knee     right cartilage repair  . ROTATOR CUFF REPAIR     right  . TOTAL ABDOMINAL HYSTERECTOMY W/ BILATERAL SALPINGOOPHORECTOMY    . TRANSTHORACIC ECHOCARDIOGRAM  2007   EF=>55%; borderline conc LVH; LA mod dilated; RA mild-mod dilated; mild TR; mild AVR with mildly sclerotic AV leavlefts; mild PV regurg      reports that she has never smoked. She has never used smokeless tobacco. She reports that she does not drink alcohol or use drugs.  Allergies  Allergen Reactions  . Fish Allergy Hives  . Sulfa Antibiotics     Unknown reaction    Family History  Problem Relation Age of Onset  . Cancer Father        NHL  . Cancer Maternal Grandmother        rectal  . Cancer Daughter        breast  . Cancer Maternal Aunt        breast  . Cancer Son        melanoma     Prior to Admission medications   Medication Sig Start Date End Date Taking? Authorizing Provider  aspirin 81 MG tablet Take 81 mg by mouth daily.   Yes [provider]  clobetasol (TEMOVATE) 0.05 % external solution Apply 1 application topically 2 (two) times daily.  04/21/18  Yes [provider]  CRANBERRY PO Take 1 tablet by mouth daily.    Yes [provider]  furosemide (LASIX) 20 MG tablet Take 20 mg by mouth daily.   Yes [provider]  hydrocortisone 2.5 % cream Apply 1 application topically 2 (two) times daily.  02/23/18  Yes [provider]  ipratropium (ATROVENT) 0.06 % nasal spray Place 1-2 sprays into both nostrils every 6 (six) hours as needed (rhinorrhea).   Yes [provider]  ketoconazole (NIZORAL) 2 % shampoo Apply 1 application topically 3 (three) times a week.  03/29/18  Yes [provider]  levothyroxine (SYNTHROID, LEVOTHROID) 75 MCG tablet Take 1 tablet (75 mcg total) by mouth daily before breakfast. 07/01/16  Yes Patrecia Pour, Christean Grief, MD  loratadine (CLARITIN) 10 MG tablet Take 10 mg by mouth daily.    Yes [provider]  Multiple Vitamin (MULTIVITAMIN) capsule Take 1 capsule by mouth daily.   Yes [provider]  Multiple Vitamins-Minerals (ICAPS MV PO) Take 1 capsule by mouth daily.    Yes [provider]  MYRBETRIQ 50 MG TB24 tablet Take 50 mg by mouth daily. 05/24/18  Yes [provider]  olmesartan (BENICAR) 40 MG tablet Take 40 mg by mouth daily.  12/26/17  Yes [provider]  omeprazole (PRILOSEC) 20 MG capsule Take 20 mg by mouth daily.   Yes [provider]  OVER THE COUNTER MEDICATION Take 1 tablet by mouth 2 (two) times daily. Prescription eye vitamins that come in the mail.   Yes [provider]  simvastatin (ZOCOR) 40 MG tablet Take 40 mg by mouth every evening.    Yes [provider]  calcium carbonate (OS-CAL - DOSED IN MG OF ELEMENTAL CALCIUM) 1250 (500 Ca) MG tablet Take 2 tablets by mouth 2 (two) times daily with a meal.    [provider]  diclofenac sodium (VOLTAREN) 1 % GEL Apply 2 g topically 4 (four) times daily. Rub into affected area of foot 2 to 4 times daily Patient not taking: Reported on 06/13/2018 03/18/17   Trula Slade, DPM    Physical Exam: Vitals:   06/13/18 1800 06/13/18 1846 06/13/18 2014 06/13/18 2045  BP:   106/80 (!) 146/66  Pulse:  (!) 52 70 (!) 52  Resp:  17 (!) 21 19  Temp:   100.1 F (37.8 C) 97.8 F (36.6 C)  TempSrc:   Oral Oral  SpO2:  100% 98% 98%  Weight: 48.1 kg     Height: 5\' 3"  (1.6 m)         Constitutional: NAD, calm, comfortable, frail Vitals:   06/13/18 1800 06/13/18 1846 06/13/18 2014 06/13/18 2045  BP:   106/80 (!) 146/66  Pulse:  (!) 52 70 (!) 52  Resp:  17 (!) 21 19  Temp:   100.1 F (37.8 C) 97.8 F (36.6 C)  TempSrc:   Oral Oral  SpO2:  100% 98% 98%  Weight: 48.1 kg     Height: 5\' 3"  (1.6 m)      Eyes: PERRL, lids and conjunctivae normal ENMT: Mucous membranes are moist. Posterior pharynx clear of any exudate or lesions.Normal  dentition.  Neck: normal, supple, no masses, no thyromegaly Respiratory: Fair air entry bilaterally with coarse breath sounds, mild wheeze and rales normal respiratory effort. No accessory muscle use.  Cardiovascular: Regular rate and rhythm, no murmurs / rubs / gallops. No extremity edema. 2+ pedal pulses. No carotid bruits.  Abdomen: no tenderness, no masses palpated. No hepatosplenomegaly. Bowel  sounds positive.  Musculoskeletal: no clubbing / cyanosis. No joint deformity upper and lower extremities. Good ROM, no contractures. Normal muscle tone.  Skin: no rashes, lesions, ulcers. No induration Neurologic: CN 2-12 grossly intact. Sensation intact, DTR normal. Strength 5/5 in all 4.  Psychiatric: Normal judgment and insight. Alert and oriented x 3. Normal mood.     Labs on Admission: I have personally reviewed following labs and imaging studies  CBC: Recent Labs  Lab 06/13/18 1503 06/13/18 2036  WBC 12.6* 9.9  NEUTROABS 10.4*  --   HGB 12.3 11.0*  HCT 40.0 35.5*  MCV 101.3* 103.5*  PLT 215 500   Basic Metabolic Panel: Recent Labs  Lab 06/13/18 1503 06/13/18 2036  NA 138  --   K 4.1  --   CL 107  --   CO2 21*  --   GLUCOSE 108*  --   BUN 51*  --   CREATININE 2.06* 1.72*  CALCIUM 9.4  --    GFR: Estimated Creatinine Clearance: 15.5 mL/min (A) (by C-G formula based on SCr of 1.72 mg/dL (H)). Liver Function Tests: Recent Labs  Lab 06/13/18 1503  AST 30  ALT 16  ALKPHOS 49  BILITOT 0.8  PROT 7.2  ALBUMIN 3.8   No results for input(s): LIPASE, AMYLASE in the last 168 hours. No results for input(s): AMMONIA in the last 168 hours. Coagulation Profile: No results for input(s): INR, PROTIME in the last 168 hours. Cardiac Enzymes: No results for input(s): CKTOTAL, CKMB, CKMBINDEX, TROPONINI in the last 168 hours. BNP (last 3 results) No results for input(s): PROBNP in the last 8760 hours. HbA1C: No results for input(s): HGBA1C in the last 72 hours. CBG: No  results for input(s): GLUCAP in the last 168 hours. Lipid Profile: No results for input(s): CHOL, HDL, LDLCALC, TRIG, CHOLHDL, LDLDIRECT in the last 72 hours. Thyroid Function Tests: No results for input(s): TSH, T4TOTAL, FREET4, T3FREE, THYROIDAB in the last 72 hours. Anemia Panel: No results for input(s): VITAMINB12, FOLATE, FERRITIN, TIBC, IRON, RETICCTPCT in the last 72 hours. Urine analysis:    Component Value Date/Time   COLORURINE YELLOW 11/07/2016 0216   APPEARANCEUR CLOUDY (A) 11/07/2016 0216   LABSPEC 1.031 (H) 11/07/2016 0216   PHURINE 5.0 11/07/2016 0216   GLUCOSEU NEGATIVE 11/07/2016 0216   HGBUR MODERATE (A) 11/07/2016 0216   BILIRUBINUR NEGATIVE 11/07/2016 0216   KETONESUR NEGATIVE 11/07/2016 0216   PROTEINUR 100 (A) 11/07/2016 0216   UROBILINOGEN 0.2 11/22/2013 2218   NITRITE NEGATIVE 11/07/2016 0216   LEUKOCYTESUR LARGE (A) 11/07/2016 0216   Sepsis Labs: @LABRCNTIP (procalcitonin:4,lacticidven:4) )No results found for this or any previous visit (from the past 240 hour(s)).   Radiological Exams on Admission: Dg Chest 2 View  Result Date: 06/13/2018 CLINICAL DATA:  Cough and dyspnea EXAM: CHEST - 2 VIEW COMPARISON:  11/07/2016 FINDINGS: Large hiatal hernia. Majority of the stomach is intrathoracic above the diaphragm based on prior CT of 08/04/2011. Chronic left lower lobe atelectasis unchanged Mild airspace disease right medial lung base not definitely present previously. No pleural effusion. Negative for heart failure. Atherosclerotic aortic arch. IMPRESSION: Possible new area of airspace disease in the right medial lung base which could represent pneumonia. Close follow-up recommended Large hiatal hernia with intrathoracic stomach, chronic Electronically Signed   By: Franchot Gallo M.D.   On: 06/13/2018 16:06      Assessment/Plan Principal Problem:   Pneumonia Active Problems:   Essential hypertension   Hyperlipidemia   CKD (chronic kidney disease) stage 3,  GFR 30-59 ml/min (HCC)   GERD (gastroesophageal reflux disease)   Primary hypothyroidism   Pressure injury of skin     #1 community-acquired pneumonia: Patient will be admitted.  Empirically started on IV Rocephin and Zithromax.  Monitor response.  Oxygen as needed.  Cough medications as needed.  #2 essential hypertension: Continue blood pressure control using home regimen.  #3 hyperlipidemia: Continue with statin  #4 chronic kidney disease stage III: Renal function at baseline.  #5 decubitus pressure ulcer: Patient has stage II-III decubitus ulcer.  Continue wound cares.  #6 hypothyroidism: Continue replacement with levothyroxine.   DVT prophylaxis: Heparin Code Status: Full code Family Communication: Patient's caregiver at bedside Disposition Plan: Home Consults called: None Admission status: Inpatient  Severity of Illness: The appropriate patient status for this patient is INPATIENT. Inpatient status is judged to be reasonable and necessary in order to provide the required intensity of service to ensure the patient's safety. The patient's presenting symptoms, physical exam findings, and initial radiographic and laboratory data in the context of their chronic comorbidities is felt to place them at high risk for further clinical deterioration. Furthermore, it is not anticipated that the patient will be medically stable for discharge from the hospital within 2 midnights of admission. The following factors support the patient status of inpatient.   " The patient's presenting symptoms include shortness of breath and cough. " The worrisome physical exam findings include chest x-ray showed pneumonia. " The initial radiographic and laboratory data are worrisome because of chest x-ray showed pneumonia. " The chronic co-morbidities include hypertension.   * I certify that at the point of admission it is my clinical judgment that the patient will require inpatient hospital care spanning  beyond 2 midnights from the point of admission due to high intensity of service, high risk for further deterioration and high frequency of surveillance required.Barbette Merino MD [facility] Pager [phone]  If 7PM-7AM, please contact night-coverage [email or web address] Password [facility]  06/13/2018, 11:39 PM

## 2018-06-13 NOTE — ED Notes (Addendum)
Patient transported to X-ray 

## 2018-06-13 NOTE — ED Notes (Signed)
Admitting provider at bedside.

## 2018-06-13 NOTE — ED Notes (Signed)
Report given Everrett Coombe, RN.

## 2018-06-13 NOTE — ED Notes (Signed)
Bed: VH00 Expected date:  Expected time:  Means of arrival:  Comments: EMS-cough

## 2018-06-13 NOTE — Progress Notes (Signed)
PHARMACIST - PHYSICIAN ORDER COMMUNICATION  CONCERNING: P&T Medication Policy on Herbal Medications  DESCRIPTION:  This patient's order for: cranberry tabs has been noted.  This product(s) is classified as an "herbal" or natural product. Due to a lack of definitive safety studies or FDA approval, nonstandard manufacturing practices, plus the potential risk of unknown drug-drug interactions while on inpatient medications, the Pharmacy and Therapeutics Committee does not permit the use of "herbal" or natural products of this type within [facility].   ACTION TAKEN: The pharmacy department is unable to verify this order at this time.  Please reevaluate patient's clinical condition at discharge and address if the herbal or natural product(s) should be resumed at that time.   []   We informed your patient of this safety policy and the herbal therapy interruption while hospitalized.  Gypsy Decant

## 2018-06-13 NOTE — ED Provider Notes (Signed)
[facility] DEPT Provider Note   CSN: [id number] Arrival date & time: 06/13/18  1345    History   Chief Complaint Chief Complaint  Patient presents with  . Cough    HPI Cindy [name] is a 85 y.o. female.     HPI   Cindy [name] is a 85 y.o. female, with a history of dyslipidemia, GERD, HTN, LBBB, presenting to the ED with productive cough beginning yesterday. Patient was seen in urgent care earlier today, found to be hypoxic and hypotensive, sent to the ED.  BP 96/60 and room air SPO2 93%. Patient is accompanied by her caregiver, [name], at the bedside. States her usual blood pressure is around 120/80. Denies fever/chills, shortness of breath, chest pain, abdominal pain, N/V/D, upper respiratory symptoms, or any other complaints.     Past Medical History:  Diagnosis Date  . Chronic renal insufficiency, stage III (moderate) (HCC)   . Dyslipidemia   . GERD (gastroesophageal reflux disease)   . Hiatal hernia   . History of nuclear stress test 2005   persantine; inferoapical ischmeia with EF 83%  . Hypertension   . Hypothyroidism   . LBBB (left bundle branch block)   . Lung nodule 07/01/03   right middle lobe  . Macular degeneration   . Malignant melanoma ([place]) 11/12/04   L upper arm  . Murmur, cardiac   . Osteoporosis     Patient Active Problem List   Diagnosis Date Noted  . Abdominal pain 11/08/2016  . Acute lower UTI 11/08/2016  . Acute cystitis without hematuria   . SBO (small bowel obstruction) ([place]) 06/28/2016  . Constipation, chronic 06/28/2016  . Acute distention of stomach 06/28/2016  . Eventration of left crus of diaphragm 06/27/2016  . Uncontrolled hypertension 06/27/2016  . Gastric outlet obstruction 06/27/2016  . CKD (chronic kidney disease) stage 3, GFR 30-59 ml/min (HCC) 06/14/2016  . Primary hypothyroidism 06/14/2016  . Osteoporosis, postmenopausal 06/14/2016  . OAB (overactive bladder) 02/25/2016  . Left bundle  branch block 12/14/2012  . Essential hypertension 12/14/2012  . Hyperlipidemia 12/14/2012  . Pain in shoulder 11/11/2011  . Melanoma of left upper arm ([place]) 10/19/2011  . Lung mass 06/16/2011  . Acquired deformities of toe 06/14/2011  . Personal history of other diseases of the digestive system 06/07/2011  . GERD (gastroesophageal reflux disease) 06/07/2011    Past Surgical History:  Procedure Laterality Date  . BLADDER REPAIR    . CARDIAC CATHETERIZATION  2005/2007   normal coronaries   . CATARACT EXTRACTION     bilat  . coccyx  1951   excision  . knee     right cartilage repair  . ROTATOR CUFF REPAIR     right  . TOTAL ABDOMINAL HYSTERECTOMY W/ BILATERAL SALPINGOOPHORECTOMY    . TRANSTHORACIC ECHOCARDIOGRAM  2007   EF=>55%; borderline conc LVH; LA mod dilated; RA mild-mod dilated; mild TR; mild AVR with mildly sclerotic AV leavlefts; mild PV regurg      OB History   No obstetric history on file.      Home Medications    Prior to Admission medications   Medication Sig Start Date End Date Taking? Authorizing Provider  aspirin 81 MG tablet Take 81 mg by mouth daily.   Yes [provider]  clobetasol (TEMOVATE) 0.05 % external solution Apply 1 application topically 2 (two) times daily.  04/21/18  Yes [provider]  CRANBERRY PO Take 1 tablet by mouth daily.    Yes [provider]  furosemide (LASIX) 20 MG tablet Take 20 mg by mouth daily.   Yes [provider]  hydrocortisone 2.5 % cream Apply 1 application topically 2 (two) times daily.  02/23/18  Yes [provider]  ipratropium (ATROVENT) 0.06 % nasal spray Place 1-2 sprays into both nostrils every 6 (six) hours as needed (rhinorrhea).   Yes [provider]  ketoconazole (NIZORAL) 2 % shampoo Apply 1 application topically 3 (three) times a week.  03/29/18  Yes [provider]  levothyroxine (SYNTHROID, LEVOTHROID) 75 MCG tablet Take 1 tablet (75 mcg total)  by mouth daily before breakfast. 07/01/16  Yes Patrecia Pour, Christean Grief, MD  loratadine (CLARITIN) 10 MG tablet Take 10 mg by mouth daily.   Yes [provider]  Multiple Vitamin (MULTIVITAMIN) capsule Take 1 capsule by mouth daily.   Yes [provider]  Multiple Vitamins-Minerals (ICAPS MV PO) Take 1 capsule by mouth daily.    Yes [provider]  MYRBETRIQ 50 MG TB24 tablet Take 50 mg by mouth daily. 05/24/18  Yes [provider]  olmesartan (BENICAR) 40 MG tablet Take 40 mg by mouth daily.  12/26/17  Yes [provider]  omeprazole (PRILOSEC) 20 MG capsule Take 20 mg by mouth daily.   Yes [provider]  OVER THE COUNTER MEDICATION Take 1 tablet by mouth 2 (two) times daily. Prescription eye vitamins that come in the mail.   Yes [provider]  simvastatin (ZOCOR) 40 MG tablet Take 40 mg by mouth every evening.    Yes [provider]  calcium carbonate (OS-CAL - DOSED IN MG OF ELEMENTAL CALCIUM) 1250 (500 Ca) MG tablet Take 2 tablets by mouth 2 (two) times daily with a meal.    [provider]  diclofenac sodium (VOLTAREN) 1 % GEL Apply 2 g topically 4 (four) times daily. Rub into affected area of foot 2 to 4 times daily Patient not taking: Reported on 06/13/2018 03/18/17   Trula Slade, DPM    Family History Family History  Problem Relation Age of Onset  . Cancer Father        NHL  . Cancer Maternal Grandmother        rectal  . Cancer Daughter        breast  . Cancer Maternal Aunt        breast  . Cancer Son        melanoma    Social History Social History   Tobacco Use  . Smoking status: Never Smoker  . Smokeless tobacco: Never Used  Substance Use Topics  . Alcohol use: No  . Drug use: No     Allergies   Fish allergy and Sulfa antibiotics   Review of Systems Review of Systems  Constitutional: Negative for chills, diaphoresis and fever.  HENT: Negative for congestion, rhinorrhea and sore  throat.   Respiratory: Positive for cough. Negative for shortness of breath.   Cardiovascular: Negative for chest pain and leg swelling.  Gastrointestinal: Negative for abdominal pain, diarrhea, nausea and vomiting.  All other systems reviewed and are negative.    Physical Exam Updated Vital Signs BP 97/68 (BP Location: Left Arm)   Pulse (!) 58   Temp (!) 97.5 F (36.4 C)   Resp 20   SpO2 98%   Physical Exam Vitals signs and nursing note reviewed.  Constitutional:      General: She is not in acute distress.    Appearance: She is well-developed. She is  not diaphoretic.  HENT:     Head: Normocephalic and atraumatic.     Nose: Nose normal.     Mouth/Throat:     Mouth: Mucous membranes are moist.     Pharynx: Oropharynx is clear.  Eyes:     Conjunctiva/sclera: Conjunctivae normal.  Neck:     Musculoskeletal: Neck supple.  Cardiovascular:     Rate and Rhythm: Normal rate and regular rhythm.     Pulses: Normal pulses.     Heart sounds: Normal heart sounds.     Comments: Tactile temperature in the extremities appropriate and equal bilaterally. Pulmonary:     Breath sounds: Examination of the left-lower field reveals rhonchi. Wheezing (Expiratory, global) and rhonchi present.     Comments: SPO2 98% on 2 L supplemental O2. SPO2 dropped to 90-93% on room air and was accompanied by tachypnea and increased work of breathing, though patient denied subjective shortness of breath. Abdominal:     Palpations: Abdomen is soft.     Tenderness: There is no abdominal tenderness. There is no guarding.  Musculoskeletal:     Right lower leg: No edema.     Left lower leg: No edema.  Lymphadenopathy:     Cervical: No cervical adenopathy.  Skin:    General: Skin is warm and dry.     Capillary Refill: Capillary refill takes less than 2 seconds.  Neurological:     Mental Status: She is alert.  Psychiatric:        Mood and Affect: Mood and affect normal.        Speech: Speech normal.         Behavior: Behavior normal.      ED Treatments / Results  Labs (all labs ordered are listed, but only abnormal results are displayed) Labs Reviewed  COMPREHENSIVE METABOLIC PANEL - Abnormal; Notable for the following components:      Result Value   CO2 21 (*)    Glucose, Bld 108 (*)    BUN 51 (*)    Creatinine, Ser 2.06 (*)    GFR calc non Af Amer 20 (*)    GFR calc Af Amer 23 (*)    All other components within normal limits  CBC WITH DIFFERENTIAL/PLATELET - Abnormal; Notable for the following components:   WBC 12.6 (*)    MCV 101.3 (*)    Neutro Abs 10.4 (*)    Monocytes Absolute 1.1 (*)    All other components within normal limits  CULTURE, BLOOD (ROUTINE X 2)  CULTURE, BLOOD (ROUTINE X 2)  LACTIC ACID, PLASMA  INFLUENZA PANEL BY PCR (TYPE A & B)  LACTIC ACID, PLASMA    EKG EKG Interpretation  Date/Time:  Tuesday June 13 2018 14:50:55 EST Ventricular Rate:  63 PR Interval:    QRS Duration: 138 QT Interval:  467 QTC Calculation: 479 R Axis:   -46 Text Interpretation:  Sinus rhythm Atrial premature complexes in couplets Prolonged PR interval Left bundle branch block tachycardia resolved compared to 2018 Confirmed by Sherwood Gambler [id number]) on 06/13/2018 2:54:20 PM   Radiology Dg Chest 2 View  Result Date: 06/13/2018 CLINICAL DATA:  Cough and dyspnea EXAM: CHEST - 2 VIEW COMPARISON:  11/07/2016 FINDINGS: Large hiatal hernia. Majority of the stomach is intrathoracic above the diaphragm based on prior CT of 08/04/2011. Chronic left lower lobe atelectasis unchanged Mild airspace disease right medial lung base not definitely present previously. No pleural effusion. Negative for heart failure. Atherosclerotic aortic arch. IMPRESSION: Possible new area of airspace  disease in the right medial lung base which could represent pneumonia. Close follow-up recommended Large hiatal hernia with intrathoracic stomach, chronic Electronically Signed   By: Franchot Gallo M.D.   On:  06/13/2018 16:06    Procedures Procedures (including critical care time)  Medications Ordered in ED Medications  cefTRIAXone (ROCEPHIN) 2 g in sodium chloride 0.9 % 100 mL IVPB (2 g Intravenous New Bag/Given 06/13/18 1710)  azithromycin (ZITHROMAX) 500 mg in sodium chloride 0.9 % 250 mL IVPB (500 mg Intravenous Not Given 06/13/18 1816)  sodium chloride 0.9 % bolus 500 mL (has no administration in time range)  azithromycin (ZITHROMAX) 500 mg in sodium chloride 0.9 % 250 mL IVPB (has no administration in time range)  ipratropium-albuterol (DUONEB) 0.5-2.5 (3) MG/3ML nebulizer solution 3 mL (3 mLs Nebulization Given 06/13/18 1538)  sodium chloride 0.9 % bolus 1,000 mL (0 mLs Intravenous Stopped 06/13/18 1817)     Initial Impression / Assessment and Plan / ED Course  I have reviewed the triage vital signs and the nursing notes.  Pertinent labs & imaging results that were available during my care of the patient were reviewed by me and considered in my medical decision making (see chart for details).  Clinical Course as of Jun 13 1818  Tue Jun 13, 2018  1619 Noted to be 1.87 on 03/24/18.  Creatinine(!): 2.06 [SJ]  1620 44 on 03/24/18.  BUN(!): 51 [SJ]  1814 Spoke with Dr. Jonelle Sidle, hospitalist. Agrees to admit the patient.   [SJ]    Clinical Course User Index [SJ] Joy, Shawn C, PA-C       Patient presents with cough beginning yesterday.  Was evaluated at urgent care and found to be hypotensive and hypoxic, sent to the ED.  She was hypotensive and tachypneic upon my evaluation.  Found to have evidence of pneumonia on chest x-ray.  Leukocytosis present.  SIRS criteria met.  Patient admitted for further management.  Findings and plan of care discussed with Daleen Bo, MD.   Vitals:   06/13/18 1705 06/13/18 1710 06/13/18 1800 06/13/18 1800  BP:  92/80 119/66   Pulse: 77 77 61   Resp: (!) 22 (!) 23 (!) 21   Temp:      TempSrc:      SpO2: 97% 98% 99%   Weight:    48.1 kg  Height:    5'  3" (1.6 m)     Final Clinical Impressions(s) / ED Diagnoses   Final diagnoses:  Community acquired pneumonia of right middle lobe of lung [facility])    ED Discharge Orders    None       Layla Maw 06/13/18 1821    Daleen Bo, MD 06/14/18 1118

## 2018-06-13 NOTE — ED Provider Notes (Signed)
  Face-to-face evaluation   History: She reports being ill since yesterday with cough, producing sputum which is "normal color."  She feels a little dizzy today.  She tried to vote today but went to the wrong pulling site.  She denies vomiting.  No known recent sick contacts.  Physical exam: Fully, alert, cooperative.  Heart normal rhythm.  Lungs scattered rhonchi.  She is tachypneic without increased work of breathing.  Extremities no peripheral edema.  Medical screening examination/treatment/procedure(s) were conducted as a shared visit with non-physician practitioner(s) and myself.  I personally evaluated the patient during the encounter    Daleen Bo, MD 06/14/18 1119

## 2018-06-13 NOTE — Progress Notes (Signed)
New Admission Note:    Arrival Method: Bed Mental Orientation: A & O X 4 Telemetry: Box 54 Assessment:  Complete Skin:  Bruises lower legs, scratch marks, MASD groin and breast Iv: L Hand NS @ 75 Pain: none Tubes: purewick on, OXYGEN 2L  sats 98%  Safety Measures: Safety Fall Prevention Plan has been given, discussed and signed Admission: Completed [facility] Orientation: Patient has been orientated to the room, unit, and staff.  Family: none, caregiver went home  Orders have been reviewed and implemented. Will continue to monitor the patient. Call light has been placed within reach and bed alarm has been activated.   Tawni Carnes, RN Phone number: [phone]

## 2018-06-14 LAB — CBC
HCT: 33.7 % — ABNORMAL LOW (ref 36.0–46.0)
Hemoglobin: 10.5 g/dL — ABNORMAL LOW (ref 12.0–15.0)
MCH: 32 pg (ref 26.0–34.0)
MCHC: 31.2 g/dL (ref 30.0–36.0)
MCV: 102.7 fL — ABNORMAL HIGH (ref 80.0–100.0)
Platelets: 160 10*3/uL (ref 150–400)
RBC: 3.28 MIL/uL — ABNORMAL LOW (ref 3.87–5.11)
RDW: 13.8 % (ref 11.5–15.5)
WBC: 7.6 10*3/uL (ref 4.0–10.5)
nRBC: 0 % (ref 0.0–0.2)

## 2018-06-14 LAB — EXPECTORATED SPUTUM ASSESSMENT W GRAM STAIN, RFLX TO RESP C

## 2018-06-14 LAB — COMPREHENSIVE METABOLIC PANEL
ALT: 12 U/L (ref 0–44)
AST: 24 U/L (ref 15–41)
Albumin: 2.8 g/dL — ABNORMAL LOW (ref 3.5–5.0)
Alkaline Phosphatase: 37 U/L — ABNORMAL LOW (ref 38–126)
Anion gap: 5 (ref 5–15)
BUN: 40 mg/dL — ABNORMAL HIGH (ref 8–23)
CO2: 19 mmol/L — ABNORMAL LOW (ref 22–32)
Calcium: 8.1 mg/dL — ABNORMAL LOW (ref 8.9–10.3)
Chloride: 115 mmol/L — ABNORMAL HIGH (ref 98–111)
Creatinine, Ser: 1.36 mg/dL — ABNORMAL HIGH (ref 0.44–1.00)
GFR calc Af Amer: 39 mL/min — ABNORMAL LOW (ref >60)
GFR calc non Af Amer: 33 mL/min — ABNORMAL LOW (ref >60)
Glucose, Bld: 73 mg/dL (ref 70–99)
Potassium: 3.9 mmol/L (ref 3.5–5.1)
Sodium: 139 mmol/L (ref 135–145)
Total Bilirubin: 0.8 mg/dL (ref 0.3–1.2)
Total Protein: 5.6 g/dL — ABNORMAL LOW (ref 6.5–8.1)

## 2018-06-14 LAB — STREP PNEUMONIAE URINARY ANTIGEN: Strep Pneumo Urinary Antigen: POSITIVE — AB

## 2018-06-14 LAB — HIV ANTIBODY (ROUTINE TESTING W REFLEX): HIV Screen 4th Generation wRfx: NONREACTIVE

## 2018-06-14 MED ORDER — ORAL CARE MOUTH RINSE
15.0000 mL | Freq: Two times a day (BID) | OROMUCOSAL | Status: DC
  Administered 2018-06-14 – 2018-06-16 (×5): 15 mL via OROMUCOSAL

## 2018-06-14 NOTE — Progress Notes (Signed)
PROGRESS NOTE  Cindy [name] EHM:[id number] DOB: Dec 11, 1922 DOA: 06/13/2018 PCP: Lorne Skeens, MD  HPI/Recap of past 24 hours:  Cindy [name] is a 85 y.o. female with medical history significant of hypertension, chronic kidney disease stage III, GERD, hypothyroidism who was brought in from home secondary to cough shortness of breath and low-grade temperature.  Symptoms have been going on since last night.  Associated with productive sputum.  Sputum was white.  No nausea or vomiting.  Patient felt dizzy and clammy today.  She was seen in the ER and evaluated.  Denied any sick contacts.  Patient denied any recent change in medications.  Evaluation in the ER showed evidence of pneumonia and she is being admitted to the hospitalist service..  Chest x-ray showed right mid and lower lobe pneumonia.  Patient initiated on IV antibiotics and being admitted to the hospital.  06/14/18: Patient seen and examined at her bedside.  No acute events overnight.  Reports persistent nonproductive cough.  Not on oxygen supplementation at home.  She is alert and oriented x4.   Assessment/Plan: Principal Problem:   Pneumonia Active Problems:   Essential hypertension   Hyperlipidemia   CKD (chronic kidney disease) stage 3, GFR 30-59 ml/min (HCC)   GERD (gastroesophageal reflux disease)   Primary hypothyroidism   Pressure injury of skin  Community-acquired pneumonia, present admission Continue IV Rocephin and IV azithromycin Maintain O2 saturation greater than 92% Continue antitussives and nebs Monitor fever curve and WBC Obtain CBC in the morning  AKI on CKD 3 Baseline creatinine appears to be 1.3 with GFR of 33 Presented with creatinine of 1.72 Continue to monitor urine output Continue to avoid nephrotoxic agents/dehydration/hypotension Repeat BMP in the morning  Hypertension Blood pressure is normotensive Continue home medications  Hyperlipidemia Continue statin  Decubitus ulcer: Patient  has stage II-III decubitus ulcer.  Continue wound cares.  Hypothyroidism: Continue replacement with levothyroxine.   DVT prophylaxis: Heparin Code Status: Full code Family Communication: Patient's caregiver at bedside Disposition Plan: Home Consults called: None Admission status: Inpatient  Objective: Vitals:   06/13/18 2014 06/13/18 2045 06/14/18 0629 06/14/18 1401  BP: 106/80 (!) 146/66 (!) 161/61 (!) 156/75  Pulse: 70 (!) 52 (!) 54 (!) 106  Resp: (!) 21 19 16 17   Temp: 100.1 F (37.8 C) 97.8 F (36.6 C) 98 F (36.7 C) 97.6 F (36.4 C)  TempSrc: Oral Oral Oral   SpO2: 98% 98% 97% 100%  Weight:      Height:        Intake/Output Summary (Last 24 hours) at 06/14/2018 1438 Last data filed at 06/14/2018 0300 Gross per 24 hour  Intake 1550.51 ml  Output -  Net 1550.51 ml   Filed Weights   06/13/18 1800  Weight: 48.1 kg    Exam:  . General: 85 y.o. year-old female well developed well nourished in no acute distress.  Alert and oriented x4. . Cardiovascular: Regular rate and rhythm with no rubs or gallops.  No thyromegaly or JVD noted.   Marland Kitchen Respiratory: Mild rales at bases with no wheezes. Good inspiratory effort. . Abdomen: Soft nontender nondistended with normal bowel sounds x4 quadrants. . Musculoskeletal: Trace lower extremity edema. 2/4 pulses in all 4 extremities. Marland Kitchen Psychiatry: Mood is appropriate for condition and setting   Data Reviewed: CBC: Recent Labs  Lab 06/13/18 1503 06/13/18 2036 06/14/18 0615  WBC 12.6* 9.9 7.6  NEUTROABS 10.4*  --   --   HGB 12.3 11.0* 10.5*  HCT 40.0 35.5* 33.7*  MCV 101.3* 103.5* 102.7*  PLT 215 186 106   Basic Metabolic Panel: Recent Labs  Lab 06/13/18 1503 06/13/18 2036 06/14/18 0615  NA 138  --  139  K 4.1  --  3.9  CL 107  --  115*  CO2 21*  --  19*  GLUCOSE 108*  --  73  BUN 51*  --  40*  CREATININE 2.06* 1.72* 1.36*  CALCIUM 9.4  --  8.1*   GFR: Estimated Creatinine Clearance: 19.6 mL/min (A) (by C-G  formula based on SCr of 1.36 mg/dL (H)). Liver Function Tests: Recent Labs  Lab 06/13/18 1503 06/14/18 0615  AST 30 24  ALT 16 12  ALKPHOS 49 37*  BILITOT 0.8 0.8  PROT 7.2 5.6*  ALBUMIN 3.8 2.8*   No results for input(s): LIPASE, AMYLASE in the last 168 hours. No results for input(s): AMMONIA in the last 168 hours. Coagulation Profile: No results for input(s): INR, PROTIME in the last 168 hours. Cardiac Enzymes: No results for input(s): CKTOTAL, CKMB, CKMBINDEX, TROPONINI in the last 168 hours. BNP (last 3 results) No results for input(s): PROBNP in the last 8760 hours. HbA1C: No results for input(s): HGBA1C in the last 72 hours. CBG: No results for input(s): GLUCAP in the last 168 hours. Lipid Profile: No results for input(s): CHOL, HDL, LDLCALC, TRIG, CHOLHDL, LDLDIRECT in the last 72 hours. Thyroid Function Tests: No results for input(s): TSH, T4TOTAL, FREET4, T3FREE, THYROIDAB in the last 72 hours. Anemia Panel: No results for input(s): VITAMINB12, FOLATE, FERRITIN, TIBC, IRON, RETICCTPCT in the last 72 hours. Urine analysis:    Component Value Date/Time   COLORURINE YELLOW 11/07/2016 0216   APPEARANCEUR CLOUDY (A) 11/07/2016 0216   LABSPEC 1.031 (H) 11/07/2016 0216   PHURINE 5.0 11/07/2016 0216   GLUCOSEU NEGATIVE 11/07/2016 0216   HGBUR MODERATE (A) 11/07/2016 0216   BILIRUBINUR NEGATIVE 11/07/2016 0216   KETONESUR NEGATIVE 11/07/2016 0216   PROTEINUR 100 (A) 11/07/2016 0216   UROBILINOGEN 0.2 11/22/2013 2218   NITRITE NEGATIVE 11/07/2016 0216   LEUKOCYTESUR LARGE (A) 11/07/2016 0216   Sepsis Labs: @LABRCNTIP (procalcitonin:4,lacticidven:4)  ) Recent Results (from the past 240 hour(s))  Culture, blood (routine x 2)     Status: None (Preliminary result)   Collection Time: 06/13/18  3:04 PM  Result Value Ref Range Status   Specimen Description   Final    BLOOD RIGHT ANTECUBITAL Performed at [facility], [place] [address], [place],  [place] [address]    Special Requests   Final    BOTTLES DRAWN AEROBIC AND ANAEROBIC Blood Culture results may not be optimal due to an excessive volume of blood received in culture bottles Performed at [facility] [address], [place], [place] [address]    Culture   Final    NO GROWTH < 24 HOURS Performed at [facility] [address], [place], [place] [address]    Report Status PENDING  Incomplete  Culture, blood (routine x 2)     Status: None (Preliminary result)   Collection Time: 06/13/18  3:17 PM  Result Value Ref Range Status   Specimen Description   Final    BLOOD LEFT WRIST Performed at [facility] [address], [place], [place] [address]    Special Requests   Final    BOTTLES DRAWN AEROBIC AND ANAEROBIC Blood Culture adequate volume Performed at [facility] [address] [place],  [address]    Culture  Final    NO GROWTH < 24 HOURS Performed at [facility], [place] [address], [place], [place] [address]    Report Status PENDING  Incomplete      Studies: Dg Chest 2 View  Result Date: 06/13/2018 CLINICAL DATA:  Cough and dyspnea EXAM: CHEST - 2 VIEW COMPARISON:  11/07/2016 FINDINGS: Large hiatal hernia. Majority of the stomach is intrathoracic above the diaphragm based on prior CT of 08/04/2011. Chronic left lower lobe atelectasis unchanged Mild airspace disease right medial lung base not definitely present previously. No pleural effusion. Negative for heart failure. Atherosclerotic aortic arch. IMPRESSION: Possible new area of airspace disease in the right medial lung base which could represent pneumonia. Close follow-up recommended Large hiatal hernia with intrathoracic stomach, chronic Electronically Signed   By: Franchot Gallo M.D.   On: 06/13/2018 16:06    Scheduled Meds: . aspirin EC  81 mg Oral Daily  . clobetasol cream  1 application Topical BID  . furosemide  20 mg Oral Daily  .  heparin  5,000 Units Subcutaneous Q8H  . irbesartan  300 mg Oral Daily  . levothyroxine  75 mcg Oral QAC breakfast  . loratadine  10 mg Oral Daily  . mirabegron ER  50 mg Oral Daily  . multivitamin  1 tablet Oral Daily  . multivitamin with minerals  1 tablet Oral Daily  . pantoprazole  40 mg Oral Daily  . simvastatin  40 mg Oral QPM    Continuous Infusions: . sodium chloride 75 mL/hr at 06/14/18 1207  . azithromycin    . cefTRIAXone (ROCEPHIN)  IV       LOS: 1 day     Kayleen Memos, MD [facility] Pager [phone]  If 7PM-7AM, please contact night-coverage [email or web address] Password [facility] 06/14/2018, 2:38 PM

## 2018-06-15 MED ORDER — BENZONATATE 100 MG PO CAPS
200.0000 mg | ORAL_CAPSULE | Freq: Three times a day (TID) | ORAL | Status: DC
  Administered 2018-06-15 – 2018-06-16 (×4): 200 mg via ORAL
  Filled 2018-06-15 (×4): qty 2

## 2018-06-15 MED ORDER — METHYLPREDNISOLONE SODIUM SUCC 125 MG IJ SOLR
60.0000 mg | Freq: Two times a day (BID) | INTRAMUSCULAR | Status: DC
  Administered 2018-06-15 (×2): 60 mg via INTRAVENOUS
  Filled 2018-06-15 (×2): qty 2

## 2018-06-15 MED ORDER — HYDRALAZINE HCL 10 MG PO TABS
10.0000 mg | ORAL_TABLET | Freq: Three times a day (TID) | ORAL | Status: DC
  Administered 2018-06-15 – 2018-06-16 (×3): 10 mg via ORAL
  Filled 2018-06-15 (×4): qty 1

## 2018-06-15 NOTE — Evaluation (Addendum)
Physical Therapy Evaluation Patient Details Name: Cindy [name] MRN: [id number] DOB: 1922-04-15 Today's Date: 06/15/2018   History of Present Illness  85 yo admitted 06/13/18 with  Cindy [name] is a 85 y.o. female with medical history significant of hypertension, chronic kidney disease stage III, GERD, hypothyroidism with cough, fever and pneumonia  Clinical Impression  The patient is motivated to to get strong enough to return home. Patient reports having a caregiver  As needed. Patient did ambulate in room with 1 assist. HR 72, Oxygen on RA 92%. Replaced 2 liters after mobility. Patient plans Dc home. Pt admitted with above diagnosis. Pt currently with functional limitations due to the deficits listed below (see PT Problem List).  Pt will benefit from skilled PT to increase their independence and safety with mobility to allow discharge to the venue listed below.   Will check O2 saturation next visit.     Follow Up Recommendations Home health PT;Supervision/Assistance - 24 hour    Equipment Recommendations  (? rollator)    Recommendations for Other Services       Precautions / Restrictions Precautions Precautions: Fall      Mobility  Bed Mobility Overal bed mobility:  supervision              Transfers Overall transfer level: Needs assistance Equipment used: 1 person hand held assist Transfers: Sit to/from Stand Sit to Stand: Min guard         General transfer comment: from toilet and  bed, uses UE for support to push off  Ambulation/Gait Ambulation/Gait assistance: Min assist Gait Distance (Feet): 50 Feet Assistive device: 1 person hand held assist Gait Pattern/deviations: Step-through pattern;Drifts right/left;Trunk flexed Gait velocity: decr   General Gait Details: relies on eithrer HHA or cruises on objects for stability.  Stairs            Wheelchair Mobility    Modified Rankin (Stroke Patients Only)       Balance Overall balance assessment:  Needs assistance Sitting-balance support: No upper extremity supported;Feet supported Sitting balance-Leahy Scale: Good     Standing balance support: During functional activity;Bilateral upper extremity supported Standing balance-Leahy Scale: Fair Standing balance comment: needs some support                             Pertinent Vitals/Pain Pain Assessment: No/denies pain    Home Living Family/patient expects to be discharged to:: Private residence Living Arrangements: Alone Available Help at Discharge: Personal care attendant(for several hours / day) Type of Home: House Home Access: Level entry     Home Layout: One level Home Equipment: None      Prior Function Level of Independence: Independent         Comments: drives     Hand Dominance        Extremity/Trunk Assessment   Upper Extremity Assessment Upper Extremity Assessment: Generalized weakness    Lower Extremity Assessment Lower Extremity Assessment: Generalized weakness    Cervical / Trunk Assessment Cervical / Trunk Assessment: Kyphotic  Communication      Cognition Arousal/Alertness: Awake/alert Behavior During Therapy: WFL for tasks assessed/performed Overall Cognitive Status: Within Functional Limits for tasks assessed                                        General Comments      Exercises  Assessment/Plan    PT Assessment Patient needs continued PT services  PT Problem List Decreased strength;Decreased activity tolerance;Decreased mobility;Decreased knowledge of precautions;Decreased balance;Cardiopulmonary status limiting activity       PT Treatment Interventions DME instruction;Therapeutic exercise;Functional mobility training;Gait training;Therapeutic activities;Patient/family education    PT Goals (Current goals can be found in the Care Plan section)  Acute Rehab PT Goals Patient Stated Goal: to go home PT Goal Formulation: With patient Time For  Goal Achievement: 06/29/18 Potential to Achieve Goals: Good    Frequency Min 3X/week   Barriers to discharge        Co-evaluation               AM-PAC PT "6 Clicks" Mobility  Outcome Measure Help needed turning from your back to your side while in a flat bed without using bedrails?: None Help needed moving from lying on your back to sitting on the side of a flat bed without using bedrails?: None Help needed moving to and from a bed to a chair (including a wheelchair)?: A Little Help needed standing up from a chair using your arms (e.g., wheelchair or bedside chair)?: A Lot Help needed to walk in hospital room?: A Lot Help needed climbing 3-5 steps with a railing? : Total 6 Click Score: 16    End of Session   Activity Tolerance: Patient limited by fatigue Patient left: in bed;with call bell/phone within reach;with bed alarm set;with nursing/sitter in room Nurse Communication: Mobility status PT Visit Diagnosis: Unsteadiness on feet (R26.81)    Time: 2820-8138 PT Time Calculation (min) (ACUTE ONLY): 33 min   Charges:   PT Evaluation $PT Eval Low Complexity: 1 Low PT Treatments $Gait Training: 8-22 mins        [place] Pager [phone] Office [phone]   Claretha Cooper 06/15/2018, 3:49 PM

## 2018-06-15 NOTE — Progress Notes (Signed)
PROGRESS NOTE  Cindy [name] OFB:[id number] DOB: 03-Apr-1923 DOA: 06/13/2018 PCP: Lorne Skeens, MD  HPI/Recap of past 24 hours:  Cindy [name] is a 85 y.o. female with medical history significant of hypertension, chronic kidney disease stage III, GERD, hypothyroidism who was brought in from home secondary to cough shortness of breath and low-grade temperature.  Symptoms have been going on since last night.  Associated with productive sputum.  Sputum was white.  No nausea or vomiting.  Patient felt dizzy and clammy today.  She was seen in the ER and evaluated.  Denied any sick contacts.  Patient denied any recent change in medications.  Evaluation in the ER showed evidence of pneumonia and she is being admitted to the hospitalist service..  Chest x-ray showed right mid and lower lobe pneumonia.  Patient initiated on IV antibiotics and being admitted to the hospital.  06/15/18: Patient seen and examined at her bedside.  No acute events overnight.  Reports persistent nonproductive cough.  She is not on oxygen supplementation at home.   Assessment/Plan: Principal Problem:   Pneumonia Active Problems:   Essential hypertension   Hyperlipidemia   CKD (chronic kidney disease) stage 3, GFR 30-59 ml/min (HCC)   GERD (gastroesophageal reflux disease)   Primary hypothyroidism   Pressure injury of skin  Community-acquired pneumonia, present admission Continue IV Rocephin and IV azithromycin day #3 Positive urine antigen for strep pneumonia Sputum culture resent for growth Maintain O2 saturation greater than 92% Continue antitussives and nebs Monitor fever curve and WBC Obtain CBC with differentials in the morning Obtain procalcitonin Obtain home O2 evaluation in the morning for DC planning Continue with continuous oximetry Added Solu-Medrol 60 mg twice daily due to wheezing  Acute hypoxic respiratory failure secondary to community-acquired pneumonia Management as stated above  AKI on  CKD 3 Baseline creatinine appears to be 1.3 with GFR of 33 Presented with creatinine of 1.72 Latest creatinine 1.3 with GFR of 33 Continue to monitor urine output Continue to avoid nephrotoxic agents/dehydration/hypotension Repeat BMP in the morning  Uncontrolled hypertension Resume home p.o. Lasix 20 mg daily Resume home ibesartan Start hydralazine 10 mg 3 times daily  Sinus bradycardia Obtain twelve-lead EKG Not on any AV blocking medications Start hydralazine to counteract bradycardia and to address hypertension  Hyperlipidemia Continue statin  Decubitus ulcer: Patient has stage II-III decubitus ulcer.  Continue wound care.  Hypothyroidism: Continue replacement with levothyroxine.   DVT prophylaxis:  Subcu Heparin 3 times daily Code Status: Full code Family Communication:  None at bedside Disposition Plan: Home Consults called: None  Objective: Vitals:   06/14/18 2015 06/15/18 0618 06/15/18 1421 06/15/18 1424  BP: (!) 146/68 (!) 156/71 (!) 178/92 (!) 159/79  Pulse: (!) 55 (!) 51 (!) 54 (!) 48  Resp: 15 16 17  (!) 21  Temp: 97.9 F (36.6 C) 97.9 F (36.6 C) 98.4 F (36.9 C) 97.8 F (36.6 C)  TempSrc: Oral Oral Oral Oral  SpO2: 100% 95% 97% 98%  Weight:      Height:        Intake/Output Summary (Last 24 hours) at 06/15/2018 1458 Last data filed at 06/15/2018 0600 Gross per 24 hour  Intake 1235.07 ml  Output 650 ml  Net 585.07 ml   Filed Weights   06/13/18 1800  Weight: 48.1 kg    Exam:  . General: 85 y.o. year-old female frail-appearing appears uncomfortable due to persistent cough.  Alert and oriented x3. . Cardiovascular: Regular rate and rhythm with no rubs or gallops.  No JVD or thyromegaly . Respiratory: Mild wheezes bilaterally.  Good inspiratory effort. . Abdomen: Soft nontender nondistended with normal bowel sounds x4 quadrants. . Musculoskeletal: Trace lower extremity edema. 2/4 pulses in all 4 extremities. Marland Kitchen Psychiatry: Mood is appropriate  for condition and setting   Data Reviewed: CBC: Recent Labs  Lab 06/13/18 1503 06/13/18 2036 06/14/18 0615  WBC 12.6* 9.9 7.6  NEUTROABS 10.4*  --   --   HGB 12.3 11.0* 10.5*  HCT 40.0 35.5* 33.7*  MCV 101.3* 103.5* 102.7*  PLT 215 186 948   Basic Metabolic Panel: Recent Labs  Lab 06/13/18 1503 06/13/18 2036 06/14/18 0615  NA 138  --  139  K 4.1  --  3.9  CL 107  --  115*  CO2 21*  --  19*  GLUCOSE 108*  --  73  BUN 51*  --  40*  CREATININE 2.06* 1.72* 1.36*  CALCIUM 9.4  --  8.1*   GFR: Estimated Creatinine Clearance: 19.6 mL/min (A) (by C-G formula based on SCr of 1.36 mg/dL (H)). Liver Function Tests: Recent Labs  Lab 06/13/18 1503 06/14/18 0615  AST 30 24  ALT 16 12  ALKPHOS 49 37*  BILITOT 0.8 0.8  PROT 7.2 5.6*  ALBUMIN 3.8 2.8*   No results for input(s): LIPASE, AMYLASE in the last 168 hours. No results for input(s): AMMONIA in the last 168 hours. Coagulation Profile: No results for input(s): INR, PROTIME in the last 168 hours. Cardiac Enzymes: No results for input(s): CKTOTAL, CKMB, CKMBINDEX, TROPONINI in the last 168 hours. BNP (last 3 results) No results for input(s): PROBNP in the last 8760 hours. HbA1C: No results for input(s): HGBA1C in the last 72 hours. CBG: No results for input(s): GLUCAP in the last 168 hours. Lipid Profile: No results for input(s): CHOL, HDL, LDLCALC, TRIG, CHOLHDL, LDLDIRECT in the last 72 hours. Thyroid Function Tests: No results for input(s): TSH, T4TOTAL, FREET4, T3FREE, THYROIDAB in the last 72 hours. Anemia Panel: No results for input(s): VITAMINB12, FOLATE, FERRITIN, TIBC, IRON, RETICCTPCT in the last 72 hours. Urine analysis:    Component Value Date/Time   COLORURINE YELLOW 11/07/2016 0216   APPEARANCEUR CLOUDY (A) 11/07/2016 0216   LABSPEC 1.031 (H) 11/07/2016 0216   PHURINE 5.0 11/07/2016 0216   GLUCOSEU NEGATIVE 11/07/2016 0216   HGBUR MODERATE (A) 11/07/2016 0216   BILIRUBINUR NEGATIVE 11/07/2016  0216   KETONESUR NEGATIVE 11/07/2016 0216   PROTEINUR 100 (A) 11/07/2016 0216   UROBILINOGEN 0.2 11/22/2013 2218   NITRITE NEGATIVE 11/07/2016 0216   LEUKOCYTESUR LARGE (A) 11/07/2016 0216   Sepsis Labs: @LABRCNTIP (procalcitonin:4,lacticidven:4)  ) Recent Results (from the past 240 hour(s))  Culture, blood (routine x 2)     Status: None (Preliminary result)   Collection Time: 06/13/18  3:04 PM  Result Value Ref Range Status   Specimen Description   Final    BLOOD RIGHT ANTECUBITAL Performed at [facility], [place] [address] [place], [place] [address]    Special Requests   Final    BOTTLES DRAWN AEROBIC AND ANAEROBIC Blood Culture results may not be optimal due to an excessive volume of blood received in culture bottles Performed at [place] [address], [place], [place] [address]    Culture   Final    NO GROWTH 2 DAYS Performed at [place] [address], [place], [place] [address]    Report Status PENDING  Incomplete  Culture, blood (routine x 2)  Status: None (Preliminary result)   Collection Time: 06/13/18  3:17 PM  Result Value Ref Range Status   Specimen Description   Final    BLOOD LEFT WRIST Performed at [facility] [address] [place], [place] [address]    Special Requests   Final    BOTTLES DRAWN AEROBIC AND ANAEROBIC Blood Culture adequate volume Performed at [facility] [address], [place], [place] [address]    Culture   Final    NO GROWTH 2 DAYS Performed at [facility] [address], [place], [place] [address]    Report Status PENDING  Incomplete  Culture, sputum-assessment     Status: None   Collection Time: 06/14/18  1:52 PM  Result Value Ref Range Status   Specimen Description SPU EXPECTORATED  Final   Special Requests NONE  Final   Sputum evaluation   Final    THIS SPECIMEN IS ACCEPTABLE FOR SPUTUM CULTURE Performed at [facility], [place] [address] [place], [place] [address]    Report Status 06/14/2018 FINAL  Final  Culture, respiratory     Status: None (Preliminary result)   Collection Time: 06/14/18  1:52 PM  Result Value Ref Range Status   Specimen Description   Final    SPU Performed at [facility], [place] [address], [place], [place] [address]    Special Requests   Final    NONE Reflexed from [id number] Performed at [facility], [place] [address], [place], [place] [address]    Gram Stain   Final    ABUNDANT WBC PRESENT,BOTH PMN AND MONONUCLEAR RARE GRAM POSITIVE COCCI    Culture   Final    CULTURE REINCUBATED FOR BETTER GROWTH Performed at [facility], [place] [address] [place], [place] [address]    Report Status PENDING  Incomplete      Studies: No results found.  Scheduled Meds: . aspirin EC  81 mg Oral Daily  . benzonatate  200 mg Oral TID  . clobetasol cream  1 application Topical BID  . furosemide  20 mg Oral Daily  . heparin  5,000 Units Subcutaneous Q8H  . hydrALAZINE  10 mg Oral Q8H  . irbesartan  300 mg Oral Daily  . levothyroxine  75 mcg Oral QAC breakfast  . loratadine  10 mg Oral Daily  . mouth rinse  15 mL Mouth Rinse BID  . methylPREDNISolone (SOLU-MEDROL) injection  60 mg Intravenous Q12H  . mirabegron ER  50 mg Oral Daily  . multivitamin  1 tablet Oral Daily  . multivitamin with minerals  1 tablet Oral Daily  . pantoprazole  40 mg Oral Daily  . simvastatin  40 mg Oral QPM    Continuous Infusions: . azithromycin 500 mg (06/14/18 1905)  . cefTRIAXone (ROCEPHIN)  IV 1 g (06/14/18 1651)     LOS: 2 days     Kayleen Memos, MD [facility] Pager [phone]  If 7PM-7AM, please contact night-coverage [email or web address] Password [facility] 06/15/2018, 2:58 PM

## 2018-06-16 LAB — CULTURE, RESPIRATORY W GRAM STAIN: Culture: NORMAL

## 2018-06-16 MED ORDER — PREDNISONE 5 MG (21) PO TBPK: ORAL_TABLET | ORAL | 0 refills | Status: AC

## 2018-06-16 MED ORDER — PREDNISONE 5 MG (21) PO TBPK: 5.0000 mg | ORAL_TABLET | Freq: Four times a day (QID) | ORAL | Status: DC

## 2018-06-16 MED ORDER — CEFDINIR 300 MG PO CAPS: 300.0000 mg | ORAL_CAPSULE | Freq: Two times a day (BID) | ORAL | 0 refills | Status: AC

## 2018-06-16 MED ORDER — BENZONATATE 200 MG PO CAPS: 200.0000 mg | ORAL_CAPSULE | Freq: Two times a day (BID) | ORAL | 0 refills | Status: DC | PRN

## 2018-06-16 MED ORDER — CEFDINIR 300 MG PO CAPS
300.0000 mg | ORAL_CAPSULE | ORAL | Status: DC
  Filled 2018-06-16: qty 1

## 2018-06-16 MED ORDER — ALBUTEROL SULFATE (2.5 MG/3ML) 0.083% IN NEBU: 2.5000 mg | INHALATION_SOLUTION | Freq: Two times a day (BID) | RESPIRATORY_TRACT | 0 refills | Status: DC | PRN

## 2018-06-16 MED ORDER — METHYLPREDNISOLONE SODIUM SUCC 125 MG IJ SOLR
60.0000 mg | Freq: Two times a day (BID) | INTRAMUSCULAR | Status: DC
  Administered 2018-06-16: 60 mg via INTRAVENOUS
  Filled 2018-06-16: qty 2

## 2018-06-16 MED ORDER — IPRATROPIUM-ALBUTEROL 0.5-2.5 (3) MG/3ML IN SOLN
3.0000 mL | Freq: Four times a day (QID) | RESPIRATORY_TRACT | Status: DC
  Administered 2018-06-16: 3 mL via RESPIRATORY_TRACT
  Filled 2018-06-16: qty 3

## 2018-06-16 MED ORDER — HYDRALAZINE HCL 10 MG PO TABS: 10.0000 mg | ORAL_TABLET | Freq: Three times a day (TID) | ORAL | 0 refills | Status: DC

## 2018-06-16 NOTE — Progress Notes (Signed)
PHARMACY NOTE:  ANTIMICROBIAL RENAL DOSAGE ADJUSTMENT  Current antimicrobial regimen includes a mismatch between antimicrobial dosage and estimated renal function.  As per policy approved by the Pharmacy & Therapeutics and Medical Executive Committees, the antimicrobial dosage will be adjusted accordingly.  Current antimicrobial dosage:  Cefdinir 300mg  BID  Indication: CAP  Renal Function:  Estimated Creatinine Clearance: 19.6 mL/min (A) (by C-G formula based on SCr of 1.36 mg/dL (H)). []      On intermittent HD, scheduled: []      On CRRT    Antimicrobial dosage has been changed to:  Cefdinir 300mg  q24h  Additional comments:   Thank you for allowing pharmacy to be a part of this patient's care.  Peggyann Juba, PharmD, BCPS Pager: [phone] 06/16/2018 7:14 AM

## 2018-06-16 NOTE — Progress Notes (Signed)
Patient given discharge instructions, and verbalized an understanding of all discharge instructions.  Patient agrees with discharge plan, and is being discharged in stable medical condition.  Patient given transportation via wheelchair. 

## 2018-06-16 NOTE — Progress Notes (Signed)
Physical Therapy Treatment Patient Details Name: Cindy [name] MRN: [id number] DOB: 01-Nov-1922 Today's Date: 06/16/2018    History of Present Illness 85 yo admitted 06/13/18 with  Cindy [name] is a 85 y.o. female with medical history significant of hypertension, chronic kidney disease stage III, GERD, hypothyroidism with cough, fever and pneumonia    PT Comments    Patient is progressing well. Plans DC home w/ HHPT. Has caregiver available.   Follow Up Recommendations  Home health PT;Supervision/Assistance - 24 hour     Equipment Recommendations    has a RW   Recommendations for Other Services       Precautions / Restrictions   fall   Mobility  Bed Mobility               General bed mobility comments: oob  Transfers   Equipment used: Rolling walker (2 wheeled) Transfers: Sit to/from Stand Sit to Stand: Supervision            Ambulation/Gait Ambulation/Gait assistance: Min guard Gait Distance (Feet): 100 Feet Assistive device: Rolling walker (2 wheeled) Gait Pattern/deviations: Step-through pattern     General Gait Details: ambualted x 100' with 1 HHA.   Stairs             Wheelchair Mobility    Modified Rankin (Stroke Patients Only)       Balance                                            Cognition Arousal/Alertness: Awake/alert                                            Exercises      General Comments        Pertinent Vitals/Pain Pain Assessment: No/denies pain    Home Living                      Prior Function            PT Goals (current goals can now be found in the care plan section) Progress towards PT goals: Progressing toward goals    Frequency    Min 3X/week      PT Plan Current plan remains appropriate    Co-evaluation              AM-PAC PT "6 Clicks" Mobility   Outcome Measure  Help needed turning from your back to your side while in a flat bed  without using bedrails?: None Help needed moving from lying on your back to sitting on the side of a flat bed without using bedrails?: None Help needed moving to and from a bed to a chair (including a wheelchair)?: A Little Help needed standing up from a chair using your arms (e.g., wheelchair or bedside chair)?: A Little Help needed to walk in hospital room?: A Little Help needed climbing 3-5 steps with a railing? : A Lot 6 Click Score: 19    End of Session   Activity Tolerance: Patient tolerated treatment well Patient left: in chair;with call bell/phone within reach;with nursing/sitter in room Nurse Communication: Mobility status PT Visit Diagnosis: Unsteadiness on feet (R26.81)     Time: 5277-8242 PT Time Calculation (min) (ACUTE ONLY): 12 min  Charges:  $  Gait Training: 8-22 mins                     [place] Pager [phone] Office [phone]    Claretha Cooper 06/16/2018, 2:12 PM

## 2018-06-16 NOTE — Care Management Note (Signed)
Case Management Note  Patient Details  Name: Cindy [name] MRN: [id number] Date of Birth: April 25, 1922  Subjective/Objective:                  Discharge Planning  Action/Plan: See assessment  Expected Discharge Date:  (unknown)               Expected Discharge Plan:  [place]  In-House Referral:     Discharge planning Services  CM Consult  Post Acute Care Choice:  Home Health, Durable Medical Equipment Choice offered to:  Patient  DME Arranged:  Nebulizer machine DME Agency:  [facility]  HH Arranged:  RN, PT [place] Agency:  [facility]  Status of Service:  In process, will continue to follow  If discussed at Long Length of Stay Meetings, dates discussed:    Additional Comments:  Leeroy Cha, RN 06/16/2018, 10:37 AM

## 2018-06-16 NOTE — Discharge Summary (Signed)
Discharge Summary  Cindy [name] FBP:[id number] DOB: [birth year]  PCP: Lorne Skeens, MD  Admit date: 06/13/2018 Discharge date: 06/16/2018  Time spent: 35 minutes  Recommendations for Outpatient Follow-up:  1. Follow-up with your PCP 2. Continue physical therapy 3. Take your medications as prescribed 4. Fall precautions  Discharge Diagnoses:  Active Hospital Problems   Diagnosis Date Noted  . Pneumonia 06/13/2018  . Pressure injury of skin 06/13/2018  . CKD (chronic kidney disease) stage 3, GFR 30-59 ml/min (HCC) 06/14/2016  . Primary hypothyroidism 06/14/2016  . Essential hypertension 12/14/2012  . Hyperlipidemia 12/14/2012  . GERD (gastroesophageal reflux disease) 06/07/2011    Resolved Hospital Problems  No resolved problems to display.    Discharge Condition: Stable  Diet recommendation: Resume previous diet  Vitals:   06/16/18 0538 06/16/18 0841  BP: (!) 161/90   Pulse: (!) 57   Resp: 16   Temp: 98.4 F (36.9 C)   SpO2: 100% 95%    History of present illness:  Cindy [name] a 85 y.o.femalewith medical history significant ofhypertension, chronic kidney disease stage III, GERD, hypothyroidism who was brought in from home secondary to productive cough, shortness of breath, and low-grade temperature. Symptoms have been going on since the night prior to presentation. No nausea or vomiting. Patient felt dizzy and clammy. She was seen in the ER and evaluated. Denied any sick contacts. Norecent change in medications.  Chest x-ray done in the ED showed evidence of right mid and lower lobe pneumonia.  TRH asked to admit.    Tolerated IV antibiotics well.    Hospital course complicated by hypoxemia requiring oxygen supplementation and generalized weakness.  Hypoxemia resolved.  PT assessed and recommended home health PT.  06/16/18: Patient seen and examined at her bedside.  No acute events overnight.  She states she feels better.  O2 saturation reported at  100% on room air early this morning at 0538.  She has no new complaints and tolerating a diet well.  On the day of discharge, the patient was hemodynamically stable.  She will need to follow-up with her primary care provider posthospitalization.  She will also need to take her medications as prescribed and continue physical therapy.  Fall precautions.     Hospital Course:  Principal Problem:   Pneumonia Active Problems:   Essential hypertension   Hyperlipidemia   CKD (chronic kidney disease) stage 3, GFR 30-59 ml/min (HCC)   GERD (gastroesophageal reflux disease)   Primary hypothyroidism   Pressure injury of skin  Resolving community-acquired pneumonia, present admission Completed 3 days of IV Rocephin and IV azithromycin Afebrile  No leukocytosis on latest CBC Positive urine antigen for strep pneumonia Sputum culture resent for growth Continue p.o. cefdinir x5 days Continue prednisone taper Continue nebs twice daily PRN for shortness of breath and wheezing O2 saturation reported at 100% on room air as of this morning at 0538 Follow-up with your PCP posthospitalization  Resolving acute hypoxic respiratory failure secondary to community-acquired pneumonia Management as stated above  Resolving AKI on CKD 3 Baseline creatinine appears to be 1.3 with GFR of 33 Presented with creatinine of 1.72 Latest creatinine 1.3 with GFR of 33 Follow-up with your PCP  Essential hypertension Resume home p.o. Lasix 20 mg daily Resume home ibesartan Continue hydralazine 10 mg 3 times daily Follow-up with PCP  Sinus bradycardia, asymptomatic Not on any AV blocking medications Continue hydralazine to counteract bradycardia and to address hypertension Latest twelve-lead EKG done on 06/13/2018 showed sinus rhythm with rate of 61  Denies any cardiac symptoms  Hyperlipidemia Continue statin  Decubitus ulcer: Patient has stage II-III decubitus ulcer. Continue wound  care.  Hypothyroidism:Continue replacement with levothyroxine.  Ambulatory dysfunction/physical debility Continue home health PT as recommended by PT Fall precautions    Code Status:Full code    Discharge Exam: BP (!) 161/90 (BP Location: Right Arm)   Pulse (!) 57   Temp 98.4 F (36.9 C)   Resp 16   Ht 5\' 3"  (1.6 m)   Wt 48.1 kg   SpO2 95%   BMI 18.78 kg/m  . General: 85 y.o. year-old female well developed well nourished in no acute distress.  Alert and oriented x3. . Cardiovascular: Regular rate and rhythm with no rubs or gallops.  No thyromegaly or JVD noted.   Marland Kitchen Respiratory: Clear to auscultation with no wheezes or rales. Good inspiratory effort. . Abdomen: Soft nontender nondistended with normal bowel sounds x4 quadrants. . Musculoskeletal: No lower extremity edema. 2/4 pulses in all 4 extremities. Marland Kitchen Psychiatry: Mood is appropriate for condition and setting  Discharge Instructions You were cared for by a hospitalist during your hospital stay. If you have any questions about your discharge medications or the care you received while you were in the hospital after you are discharged, you can call the unit and asked to speak with the hospitalist on call if the hospitalist that took care of you is not available. Once you are discharged, your primary care physician will handle any further medical issues. Please note that NO REFILLS for any discharge medications will be authorized once you are discharged, as it is imperative that you return to your primary care physician (or establish a relationship with a primary care physician if you do not have one) for your aftercare needs so that they can reassess your need for medications and monitor your lab values.   Allergies as of 06/16/2018      Reactions   Fish Allergy Hives   Sulfa Antibiotics    Unknown reaction      Medication List    STOP taking these medications   diclofenac sodium 1 % Gel Commonly known as:   VOLTAREN     TAKE these medications   albuterol (2.5 MG/3ML) 0.083% nebulizer solution Commonly known as:  PROVENTIL Take 3 mLs (2.5 mg total) by nebulization 2 (two) times daily as needed for wheezing or shortness of breath.   aspirin 81 MG tablet Take 81 mg by mouth daily.   benzonatate 200 MG capsule Commonly known as:  TESSALON Take 1 capsule (200 mg total) by mouth 2 (two) times daily as needed for cough.   calcium carbonate 1250 (500 Ca) MG tablet Commonly known as:  OS-CAL - dosed in mg of elemental calcium Take 2 tablets by mouth 2 (two) times daily with a meal.   cefdinir 300 MG capsule Commonly known as:  OMNICEF Take 1 capsule (300 mg total) by mouth every 12 (twelve) hours for 5 days.   clobetasol 0.05 % external solution Commonly known as:  TEMOVATE Apply 1 application topically 2 (two) times daily.   CRANBERRY PO Take 1 tablet by mouth daily.   furosemide 20 MG tablet Commonly known as:  LASIX Take 20 mg by mouth daily.   hydrALAZINE 10 MG tablet Commonly known as:  APRESOLINE Take 1 tablet (10 mg total) by mouth 3 (three) times daily.   hydrocortisone 2.5 % cream Apply 1 application topically 2 (two) times daily.   ICAPS MV PO Take 1 capsule  by mouth daily.   ipratropium 0.06 % nasal spray Commonly known as:  ATROVENT Place 1-2 sprays into both nostrils every 6 (six) hours as needed (rhinorrhea).   ketoconazole 2 % shampoo Commonly known as:  NIZORAL Apply 1 application topically 3 (three) times a week.   levothyroxine 75 MCG tablet Commonly known as:  SYNTHROID, LEVOTHROID Take 1 tablet (75 mcg total) by mouth daily before breakfast.   loratadine 10 MG tablet Commonly known as:  CLARITIN Take 10 mg by mouth daily.   multivitamin capsule Take 1 capsule by mouth daily.   Myrbetriq 50 MG Tb24 tablet Generic drug:  mirabegron ER Take 50 mg by mouth daily.   olmesartan 40 MG tablet Commonly known as:  BENICAR Take 40 mg by mouth daily.    omeprazole 20 MG capsule Commonly known as:  PRILOSEC Take 20 mg by mouth daily.   OVER THE COUNTER MEDICATION Take 1 tablet by mouth 2 (two) times daily. Prescription eye vitamins that come in the mail.   predniSONE 5 MG (21) Tbpk tablet Commonly known as:  STERAPRED UNI-PAK 21 TAB Please follow instructions on the box Start taking on:  June 17, 2018   simvastatin 40 MG tablet Commonly known as:  ZOCOR Take 40 mg by mouth every evening.            Durable Medical Equipment  (From admission, onward)         Start     Ordered   06/16/18 0647  For home use only DME Nebulizer machine  Once    Question:  Patient needs a nebulizer to treat with the following condition  Answer:  CAP (community acquired pneumonia)   06/16/18 0646         Allergies  Allergen Reactions  . Fish Allergy Hives  . Sulfa Antibiotics     Unknown reaction   Follow-up Information    Altheimer, Legrand Como, MD. Call in 1 day(s).   Specialty:  Endocrinology Why:  Please call on Monday, 06/19/2018 for post hospital follow-up appointment. Contact information: [place] [address] [phone]            The results of significant diagnostics from this hospitalization (including imaging, microbiology, ancillary and laboratory) are listed below for reference.    Significant Diagnostic Studies: Dg Chest 2 View  Result Date: 06/13/2018 CLINICAL DATA:  Cough and dyspnea EXAM: CHEST - 2 VIEW COMPARISON:  11/07/2016 FINDINGS: Large hiatal hernia. Majority of the stomach is intrathoracic above the diaphragm based on prior CT of 08/04/2011. Chronic left lower lobe atelectasis unchanged Mild airspace disease right medial lung base not definitely present previously. No pleural effusion. Negative for heart failure. Atherosclerotic aortic arch. IMPRESSION: Possible new area of airspace disease in the right medial lung base which could represent pneumonia. Close follow-up recommended  Large hiatal hernia with intrathoracic stomach, chronic Electronically Signed   By: Franchot Gallo M.D.   On: 06/13/2018 16:06    Microbiology: Recent Results (from the past 240 hour(s))  Culture, blood (routine x 2)     Status: None (Preliminary result)   Collection Time: 06/13/18  3:04 PM  Result Value Ref Range Status   Specimen Description   Final    BLOOD RIGHT ANTECUBITAL Performed at [facility] [address], [place], [place] [address]    Special Requests   Final    BOTTLES DRAWN AEROBIC AND ANAEROBIC Blood Culture results may not be optimal due to an excessive volume  of blood received in culture bottles Performed at [facility], [place] [address] [place], [place] [address]    Culture   Final    NO GROWTH 2 DAYS Performed at [facility] [address], [place], [place] [address]    Report Status PENDING  Incomplete  Culture, blood (routine x 2)     Status: None (Preliminary result)   Collection Time: 06/13/18  3:17 PM  Result Value Ref Range Status   Specimen Description   Final    BLOOD LEFT WRIST Performed at [facility] [address], [place], [place] [address]    Special Requests   Final    BOTTLES DRAWN AEROBIC AND ANAEROBIC Blood Culture adequate volume Performed at [facility] [address], [place], [place] [address]    Culture   Final    NO GROWTH 2 DAYS Performed at [facility] [address] [place], [place] [address]    Report Status PENDING  Incomplete  Culture, sputum-assessment     Status: None   Collection Time: 06/14/18  1:52 PM  Result Value Ref Range Status   Specimen Description SPU EXPECTORATED  Final   Special Requests NONE  Final   Sputum evaluation   Final    THIS SPECIMEN IS ACCEPTABLE FOR SPUTUM CULTURE Performed at [facility], [place] [address], [place], [place] [address]    Report Status 06/14/2018 FINAL  Final   Culture, respiratory     Status: None   Collection Time: 06/14/18  1:52 PM  Result Value Ref Range Status   Specimen Description   Final    SPU Performed at [facility], [place] [address], [place], [place] [address]    Special Requests   Final    NONE Reflexed from [id number] Performed at [facility], [place] [address], [place], [place] [address]    Gram Stain   Final    ABUNDANT WBC PRESENT,BOTH PMN AND MONONUCLEAR RARE GRAM POSITIVE COCCI    Culture   Final    Consistent with normal respiratory flora. Performed at [facility], [place] [address] [place], [place] [address]    Report Status 06/16/2018 FINAL  Final     Labs: Basic Metabolic Panel: Recent Labs  Lab 06/13/18 1503 06/13/18 2036 06/14/18 0615  NA 138  --  139  K 4.1  --  3.9  CL 107  --  115*  CO2 21*  --  19*  GLUCOSE 108*  --  73  BUN 51*  --  40*  CREATININE 2.06* 1.72* 1.36*  CALCIUM 9.4  --  8.1*   Liver Function Tests: Recent Labs  Lab 06/13/18 1503 06/14/18 0615  AST 30 24  ALT 16 12  ALKPHOS 49 37*  BILITOT 0.8 0.8  PROT 7.2 5.6*  ALBUMIN 3.8 2.8*   No results for input(s): LIPASE, AMYLASE in the last 168 hours. No results for input(s): AMMONIA in the last 168 hours. CBC: Recent Labs  Lab 06/13/18 1503 06/13/18 2036 06/14/18 0615  WBC 12.6* 9.9 7.6  NEUTROABS 10.4*  --   --   HGB 12.3 11.0* 10.5*  HCT 40.0 35.5* 33.7*  MCV 101.3* 103.5* 102.7*  PLT 215 186 160   Cardiac Enzymes: No results for input(s): CKTOTAL, CKMB, CKMBINDEX, TROPONINI in the last 168 hours. BNP: BNP (last 3 results) No results for input(s): BNP in the last 8760 hours.  ProBNP (last 3  results) No results for input(s): PROBNP in the last 8760 hours.  CBG: No results for input(s): GLUCAP in the last 168 hours.     Signed:  Kayleen Memos, MD [facility] 06/16/2018, 11:45 AM

## 2018-06-16 NOTE — Care Management Important Message (Signed)
Important Message  Patient Details  Name: Cindy [name] MRN: [id number] Date of Birth: 05-24-1922   Medicare Important Message Given:  No. Unable to give patient Medicare Important Message due to patient care in the room.   Naveh Rickles 06/16/2018, 9:04 AM

## 2018-06-18 LAB — CULTURE, BLOOD (ROUTINE X 2)
Culture: NO GROWTH
Culture: NO GROWTH
Special Requests: ADEQUATE

## 2018-07-31 ENCOUNTER — Emergency Department (HOSPITAL_COMMUNITY): Payer: Medicare Other

## 2018-07-31 ENCOUNTER — Emergency Department (HOSPITAL_COMMUNITY)
Admission: EM | Admit: 2018-07-31 | Discharge: 2018-07-31 | Disposition: A | Discharge status: Home / Self Care | Payer: Medicare Other | Attending: Emergency Medicine | Admitting: Emergency Medicine

## 2018-07-31 ENCOUNTER — Encounter (HOSPITAL_COMMUNITY): Payer: Self-pay | Admitting: Emergency Medicine

## 2018-07-31 ENCOUNTER — Other Ambulatory Visit: Payer: Self-pay

## 2018-07-31 DIAGNOSIS — S42202A Unspecified fracture of upper end of left humerus, initial encounter for closed fracture: Secondary | ICD-10-CM

## 2018-07-31 DIAGNOSIS — E039 Hypothyroidism, unspecified: Secondary | ICD-10-CM | POA: Diagnosis not present

## 2018-07-31 DIAGNOSIS — S4992XA Unspecified injury of left shoulder and upper arm, initial encounter: Secondary | ICD-10-CM | POA: Diagnosis present

## 2018-07-31 DIAGNOSIS — I129 Hypertensive chronic kidney disease with stage 1 through stage 4 chronic kidney disease, or unspecified chronic kidney disease: Secondary | ICD-10-CM | POA: Insufficient documentation

## 2018-07-31 DIAGNOSIS — W19XXXA Unspecified fall, initial encounter: Secondary | ICD-10-CM | POA: Diagnosis not present

## 2018-07-31 DIAGNOSIS — Z79899 Other long term (current) drug therapy: Secondary | ICD-10-CM | POA: Insufficient documentation

## 2018-07-31 DIAGNOSIS — Y939 Activity, unspecified: Secondary | ICD-10-CM | POA: Insufficient documentation

## 2018-07-31 DIAGNOSIS — N183 Chronic kidney disease, stage 3 (moderate): Secondary | ICD-10-CM | POA: Insufficient documentation

## 2018-07-31 DIAGNOSIS — Z7982 Long term (current) use of aspirin: Secondary | ICD-10-CM | POA: Insufficient documentation

## 2018-07-31 DIAGNOSIS — Y999 Unspecified external cause status: Secondary | ICD-10-CM | POA: Insufficient documentation

## 2018-07-31 DIAGNOSIS — S42292A Other displaced fracture of upper end of left humerus, initial encounter for closed fracture: Secondary | ICD-10-CM | POA: Insufficient documentation

## 2018-07-31 DIAGNOSIS — Y929 Unspecified place or not applicable: Secondary | ICD-10-CM | POA: Insufficient documentation

## 2018-07-31 MED ORDER — HYDROCODONE-ACETAMINOPHEN 5-325 MG PO TABS: 1.0000 | ORAL_TABLET | ORAL | 0 refills | Status: DC | PRN

## 2018-07-31 MED ORDER — OXYCODONE-ACETAMINOPHEN 5-325 MG PO TABS
1.0000 | ORAL_TABLET | Freq: Once | ORAL | Status: AC
  Administered 2018-07-31: 1 via ORAL
  Filled 2018-07-31: qty 1

## 2018-07-31 MED ORDER — HYDROCODONE-ACETAMINOPHEN 5-325 MG PO TABS
1.0000 | ORAL_TABLET | Freq: Once | ORAL | Status: AC
  Administered 2018-07-31: 1 via ORAL
  Filled 2018-07-31: qty 1

## 2018-07-31 NOTE — ED Provider Notes (Signed)
[facility] DEPT Provider Note   CSN: [id number] Arrival date & time: 07/31/18  1557    History   Chief Complaint Chief Complaint  Patient presents with  . sent from UC-fractured humerus  . Arm Pain    left  . Fall    HPI Cindy [name] is a 85 y.o. female.     85 year old female presents after be involved mechanical fall just prior to arrival.  She fell onto her left shoulder complains of severe sharp pain is worse with movement.  Pain better with remaining still.  Denies any distal numbness or tingling to her left han patient went to urgent care and had an x-ray which showed a proximal humerus fracture and was sent here for further management.  D.  Patient did strike her head but did not have any loss of consciousness..  Denies any chest back or pelvis pain.     Past Medical History:  Diagnosis Date  . Chronic renal insufficiency, stage III (moderate) (HCC)   . Dyslipidemia   . GERD (gastroesophageal reflux disease)   . Hiatal hernia   . History of nuclear stress test 2005   persantine; inferoapical ischmeia with EF 83%  . Hypertension   . Hypothyroidism   . LBBB (left bundle branch block)   . Lung nodule 07/01/03   right middle lobe  . Macular degeneration   . Malignant melanoma ([facility]) 11/12/04   L upper arm  . Murmur, cardiac   . Osteoporosis     Patient Active Problem List   Diagnosis Date Noted  . Pneumonia 06/13/2018  . Pressure injury of skin 06/13/2018  . Abdominal pain 11/08/2016  . Acute lower UTI 11/08/2016  . Acute cystitis without hematuria   . SBO (small bowel obstruction) ([facility]) 06/28/2016  . Constipation, chronic 06/28/2016  . Acute distention of stomach 06/28/2016  . Eventration of left crus of diaphragm 06/27/2016  . Uncontrolled hypertension 06/27/2016  . Gastric outlet obstruction 06/27/2016  . CKD (chronic kidney disease) stage 3, GFR 30-59 ml/min (HCC) 06/14/2016  . Primary hypothyroidism 06/14/2016  .  Osteoporosis, postmenopausal 06/14/2016  . OAB (overactive bladder) 02/25/2016  . Left bundle branch block 12/14/2012  . Essential hypertension 12/14/2012  . Hyperlipidemia 12/14/2012  . Pain in shoulder 11/11/2011  . Melanoma of left upper arm ([facility]) 10/19/2011  . Lung mass 06/16/2011  . Acquired deformities of toe 06/14/2011  . Personal history of other diseases of the digestive system 06/07/2011  . GERD (gastroesophageal reflux disease) 06/07/2011    Past Surgical History:  Procedure Laterality Date  . BLADDER REPAIR    . CARDIAC CATHETERIZATION  2005/2007   normal coronaries   . CATARACT EXTRACTION     bilat  . coccyx  1951   excision  . knee     right cartilage repair  . ROTATOR CUFF REPAIR     right  . TOTAL ABDOMINAL HYSTERECTOMY W/ BILATERAL SALPINGOOPHORECTOMY    . TRANSTHORACIC ECHOCARDIOGRAM  2007   EF=>55%; borderline conc LVH; LA mod dilated; RA mild-mod dilated; mild TR; mild AVR with mildly sclerotic AV leavlefts; mild PV regurg      OB History   No obstetric history on file.      Home Medications    Prior to Admission medications   Medication Sig Start Date End Date Taking? Authorizing Provider  albuterol (PROVENTIL) (2.5 MG/3ML) 0.083% nebulizer solution Take 3 mLs (2.5 mg total) by nebulization 2 (two) times daily as needed for wheezing or shortness  of breath. 06/16/18   Kayleen Memos, DO  aspirin 81 MG tablet Take 81 mg by mouth daily.    [provider]  benzonatate (TESSALON) 200 MG capsule Take 1 capsule (200 mg total) by mouth 2 (two) times daily as needed for cough. 06/16/18   Kayleen Memos, DO  calcium carbonate (OS-CAL - DOSED IN MG OF ELEMENTAL CALCIUM) 1250 (500 Ca) MG tablet Take 2 tablets by mouth 2 (two) times daily with a meal.    [provider]  clobetasol (TEMOVATE) 0.05 % external solution Apply 1 application topically 2 (two) times daily.  04/21/18   [provider]  CRANBERRY PO Take 1 tablet by mouth daily.      [provider]  furosemide (LASIX) 20 MG tablet Take 20 mg by mouth daily.    [provider]  hydrALAZINE (APRESOLINE) 10 MG tablet Take 1 tablet (10 mg total) by mouth 3 (three) times daily. 06/16/18   Kayleen Memos, DO  hydrocortisone 2.5 % cream Apply 1 application topically 2 (two) times daily.  02/23/18   [provider]  ipratropium (ATROVENT) 0.06 % nasal spray Place 1-2 sprays into both nostrils every 6 (six) hours as needed (rhinorrhea).    [provider]  ketoconazole (NIZORAL) 2 % shampoo Apply 1 application topically 3 (three) times a week.  03/29/18   [provider]  levothyroxine (SYNTHROID, LEVOTHROID) 75 MCG tablet Take 1 tablet (75 mcg total) by mouth daily before breakfast. 07/01/16   Patrecia Pour, Christean Grief, MD  loratadine (CLARITIN) 10 MG tablet Take 10 mg by mouth daily.    [provider]  Multiple Vitamin (MULTIVITAMIN) capsule Take 1 capsule by mouth daily.    [provider]  Multiple Vitamins-Minerals (ICAPS MV PO) Take 1 capsule by mouth daily.     [provider]  MYRBETRIQ 50 MG TB24 tablet Take 50 mg by mouth daily. 05/24/18   [provider]  olmesartan (BENICAR) 40 MG tablet Take 40 mg by mouth daily.  12/26/17   [provider]  omeprazole (PRILOSEC) 20 MG capsule Take 20 mg by mouth daily.    [provider]  OVER THE COUNTER MEDICATION Take 1 tablet by mouth 2 (two) times daily. Prescription eye vitamins that come in the mail.    [provider]  simvastatin (ZOCOR) 40 MG tablet Take 40 mg by mouth every evening.     [provider]    Family History Family History  Problem Relation Age of Onset  . Cancer Father        NHL  . Cancer Maternal Grandmother        rectal  . Cancer Daughter        breast  . Cancer Maternal Aunt        breast  . Cancer Son        melanoma    Social History Social History   Tobacco Use  . Smoking status:  Never Smoker  . Smokeless tobacco: Never Used  Substance Use Topics  . Alcohol use: No  . Drug use: No     Allergies   Fish allergy and Sulfa antibiotics   Review of Systems Review of Systems  All other systems reviewed and are negative.    Physical Exam Updated Vital Signs BP (!) 207/91 (BP Location: Right Arm)   Pulse 60   Temp 98.3 F (36.8 C) (Oral)   Resp 20   Ht 1.613 m (5' 3.5")  Wt 51.3 kg   SpO2 96%   BMI 19.70 kg/m   Physical Exam Vitals signs and nursing note reviewed.  Constitutional:      General: She is not in acute distress.    Appearance: Normal appearance. She is well-developed. She is not toxic-appearing.  HENT:     Head: Normocephalic and atraumatic.  Eyes:     General: Lids are normal.     Conjunctiva/sclera: Conjunctivae normal.     Pupils: Pupils are equal, round, and reactive to light.  Neck:     Musculoskeletal: Normal range of motion and neck supple.     Thyroid: No thyroid mass.     Trachea: No tracheal deviation.  Cardiovascular:     Rate and Rhythm: Normal rate and regular rhythm.     Heart sounds: Normal heart sounds. No murmur. No gallop.   Pulmonary:     Effort: Pulmonary effort is normal. No respiratory distress.     Breath sounds: Normal breath sounds. No stridor. No decreased breath sounds, wheezing, rhonchi or rales.  Abdominal:     General: Bowel sounds are normal. There is no distension.     Palpations: Abdomen is soft.     Tenderness: There is no abdominal tenderness. There is no rebound.  Musculoskeletal:     Left shoulder: She exhibits decreased range of motion, tenderness, bony tenderness, swelling, deformity and pain.  Skin:    General: Skin is warm and dry.     Findings: No abrasion or rash.  Neurological:     Mental Status: She is alert and oriented to person, place, and time.     GCS: GCS eye subscore is 4. GCS verbal subscore is 5. GCS motor subscore is 6.     Cranial Nerves: No cranial nerve deficit.      Sensory: No sensory deficit.  Psychiatric:        Speech: Speech normal.        Behavior: Behavior normal.      ED Treatments / Results  Labs (all labs ordered are listed, but only abnormal results are displayed) Labs Reviewed - No data to display  EKG None  Radiology No results found.  Procedures Procedures (including critical care time)  Medications Ordered in ED Medications  HYDROcodone-acetaminophen (NORCO/VICODIN) 5-325 MG per tablet 1 tablet (has no administration in time range)     Initial Impression / Assessment and Plan / ED Course  I have reviewed the triage vital signs and the nursing notes.  Pertinent labs & imaging results that were available during my care of the patient were reviewed by me and considered in my medical decision making (see chart for details).        Patient medicated with hydrocodone as well as oxycodone.  X-ray results noted and patient placed in sling for comfort.  Referred back to her orthopedist  Final Clinical Impressions(s) / ED Diagnoses   Final diagnoses:  None    ED Discharge Orders    None       Lacretia Leigh, MD 07/31/18 1733

## 2018-07-31 NOTE — Discharge Instructions (Addendum)
Call your orthopedist tomorrow to schedule a follow-up visit

## 2018-07-31 NOTE — ED Triage Notes (Signed)
Pt was seen at UC earlier for arm pain after fall today.  Pt sent to ED due to fractured humerus on xray. Pt reports that she tripped over something (thinks a wheel from her late husband's wheelchair) when coming from kitchen to living room. Denies taking blood thinners or LOC.

## 2018-08-03 ENCOUNTER — Encounter (HOSPITAL_COMMUNITY): Payer: Self-pay | Admitting: *Deleted

## 2018-08-03 ENCOUNTER — Other Ambulatory Visit: Payer: Self-pay

## 2018-08-03 NOTE — H&P (Signed)
Patient's anticipated LOS is less than 2 midnights, meeting these requirements: - Younger than 53 - Lives within 1 hour of care - Has a competent adult at home to recover with post-op recover - NO history of  - Chronic pain requiring opiods  - Diabetes  - Coronary Artery Disease  - Heart failure  - Heart attack  - Stroke  - DVT/VTE  - Cardiac arrhythmia  - Respiratory Failure/COPD  - Renal failure  - Anemia  - Advanced Liver disease       Cindy [name] is an 85 y.o. female.    Chief Complaint: left shoulder pain  HPI: Pt is a 85 y.o. female complaining of left shoulder pain for multiple years. Pain had continually increased since the beginning. X-rays in the clinic show proximal humerus fracture of the left shoulder. Pt has tried various conservative treatments which have failed to alleviate their symptoms, including injections and therapy. Various options are discussed with the patient. Risks, benefits and expectations were discussed with the patient. Patient understand the risks, benefits and expectations and wishes to proceed with surgery.   PCP:  Lorne Skeens, MD  D/C Plans: Home  PMH: Past Medical History:  Diagnosis Date  . Chronic renal insufficiency, stage III (moderate) (HCC)   . Dyslipidemia   . GERD (gastroesophageal reflux disease)   . Hiatal hernia   . History of nuclear stress test 2005   persantine; inferoapical ischmeia with EF 83%  . Hypertension   . Hypothyroidism   . LBBB (left bundle branch block)   . LBBB (left bundle branch block)    chronic per Dr. Gwenlyn Found  . Lung nodule 07/01/03   right middle lobe  . Macular degeneration   . Malignant melanoma ([place]) 11/12/04   L upper arm  . Murmur, cardiac   . Osteoporosis     PSH: Past Surgical History:  Procedure Laterality Date  . BLADDER REPAIR    . CARDIAC CATHETERIZATION  2005/2007   normal coronaries   . CATARACT EXTRACTION     bilat  . coccyx  1951   excision  . knee     right  cartilage repair  . ROTATOR CUFF REPAIR     right  . TOTAL ABDOMINAL HYSTERECTOMY W/ BILATERAL SALPINGOOPHORECTOMY    . TRANSTHORACIC ECHOCARDIOGRAM  2007   EF=>55%; borderline conc LVH; LA mod dilated; RA mild-mod dilated; mild TR; mild AVR with mildly sclerotic AV leavlefts; mild PV regurg     Social History:  reports that she has never smoked. She has never used smokeless tobacco. She reports that she does not drink alcohol or use drugs.  Allergies:  Allergies  Allergen Reactions  . Fish Allergy Hives  . Sulfa Antibiotics     Unknown reaction    Medications: No current facility-administered medications for this encounter.    Current Outpatient Medications  Medication Sig Dispense Refill  . aspirin 81 MG tablet Take 81 mg by mouth daily.    Marland Kitchen albuterol (PROVENTIL) (2.5 MG/3ML) 0.083% nebulizer solution Take 3 mLs (2.5 mg total) by nebulization 2 (two) times daily as needed for wheezing or shortness of breath. 75 mL 0  . benzonatate (TESSALON) 200 MG capsule Take 1 capsule (200 mg total) by mouth 2 (two) times daily as needed for cough. 20 capsule 0  . calcium carbonate (OS-CAL - DOSED IN MG OF ELEMENTAL CALCIUM) 1250 (500 Ca) MG tablet Take 2 tablets by mouth 2 (two) times daily with a meal.    . clobetasol (  TEMOVATE) 0.05 % external solution Apply 1 application topically 2 (two) times daily.     Marland Kitchen CRANBERRY PO Take 1 tablet by mouth daily.     . furosemide (LASIX) 20 MG tablet Take 20 mg by mouth daily.    . hydrALAZINE (APRESOLINE) 10 MG tablet Take 1 tablet (10 mg total) by mouth 3 (three) times daily. 90 tablet 0  . HYDROcodone-acetaminophen (NORCO/VICODIN) 5-325 MG tablet Take 1-2 tablets by mouth every 4 (four) hours as needed. 15 tablet 0  . hydrocortisone 2.5 % cream Apply 1 application topically 2 (two) times daily.     Marland Kitchen ipratropium (ATROVENT) 0.06 % nasal spray Place 1-2 sprays into both nostrils every 6 (six) hours as needed (rhinorrhea).    Marland Kitchen ketoconazole (NIZORAL) 2  % shampoo Apply 1 application topically 3 (three) times a week.     . levothyroxine (SYNTHROID, LEVOTHROID) 75 MCG tablet Take 1 tablet (75 mcg total) by mouth daily before breakfast.    . loratadine (CLARITIN) 10 MG tablet Take 10 mg by mouth daily.    . Multiple Vitamin (MULTIVITAMIN) capsule Take 1 capsule by mouth daily.    . Multiple Vitamins-Minerals (ICAPS MV PO) Take 1 capsule by mouth daily.     Marland Kitchen MYRBETRIQ 50 MG TB24 tablet Take 50 mg by mouth daily.    Marland Kitchen olmesartan (BENICAR) 40 MG tablet Take 40 mg by mouth daily.     Marland Kitchen omeprazole (PRILOSEC) 20 MG capsule Take 20 mg by mouth daily.    Marland Kitchen OVER THE COUNTER MEDICATION Take 1 tablet by mouth 2 (two) times daily. Prescription eye vitamins that come in the mail.    . simvastatin (ZOCOR) 40 MG tablet Take 40 mg by mouth every evening.       No results found for this or any previous visit (from the past 48 hour(s)). No results found.  ROS: Pain with rom of the left upper extremity  Physical Exam: Alert and oriented 85 y.o. female in no acute distress Cranial nerves 2-12 intact Cervical spine: full rom with no tenderness, nv intact distally Chest: active breath sounds bilaterally, no wheeze rhonchi or rales Heart: regular rate and rhythm, no murmur Abd: non tender non distended with active bowel sounds Hip is stable with rom  Left shoulder with limited rom due to fracture nv intact distally No rashes or edema  Assessment/Plan Assessment: left proximal humerus fracture  Plan:  Patient will undergo a left reverse total shoulder by Dr. Veverly Fells at [facility]. Risks benefits and expectations were discussed with the patient. Patient understand risks, benefits and expectations and wishes to proceed. Preoperative templating of the joint replacement has been completed, documented, and submitted to the Operating Room personnel in order to optimize intra-operative equipment management.   Merla Riches PA-C, MPAS [facility] is  now [employer] [address], [place], [place], [place] [address] Phone: [phone] [email or web address] Facebook  [employer]

## 2018-08-03 NOTE — Progress Notes (Addendum)
I spoke to Mrs Odum care giver, Rosemarie Ax, I was on speaker phone and patient was present and help with answering questions. Care giver Caprice Renshaw reported that care giver should be contacted, patients daughter is a patient at [facility] and patients son lives out of state.  I informed caregiver that Mrs Yost needs to add Jimmye Norman to [employer] to release information .  Jimmye Norman  denies that patient nor anyone she has been in contact with have experienced any of the following: Cough Fever >100.4 Runny Nose Sore Throat Difficulty breathing/ shortness of breath Travel in past 14 days v- none  Mrs Dilday is hard of hearing, she will not be bring hearing aid to the hospital with her.   Caregiver said that patient will not be able to come to the hospital to ger pre surgery drink. I instructed caregiver that patient should not have any food after midnight.  I informed caregiver that patient may have clear liquids until 4:40 AM.  We reviewed what clear liquids consist of.  I in boxed Dr. Veverly Fells to inform him that patient is unable to come to the hospital to get Ensure Pre- Surgery Drink.

## 2018-08-03 NOTE — Anesthesia Preprocedure Evaluation (Addendum)
Anesthesia Evaluation  Patient identified by MRN, date of birth, ID band Patient awake    Reviewed: Allergy & Precautions, NPO status , Patient's Chart, lab work & pertinent test results  History of Anesthesia Complications Negative for: history of anesthetic complications  Airway Mallampati: II  TM Distance: >3 FB Neck ROM: Full    Dental  (+) Dental Advisory Given   Pulmonary neg pulmonary ROS,    breath sounds clear to auscultation       Cardiovascular hypertension, Pt. on medications + dysrhythmias (chronic LBBB) + Valvular Problems/Murmurs  Rhythm:Regular Rate:Normal + Systolic murmurs    Neuro/Psych PSYCHIATRIC DISORDERS Depression negative neurological ROS     GI/Hepatic Neg liver ROS, hiatal hernia, GERD  Medicated and Controlled,  Endo/Other  Hypothyroidism   Renal/GU CRFRenal disease     Musculoskeletal  (+) Arthritis ,   Abdominal   Peds  Hematology negative hematology ROS (+)   Anesthesia Other Findings   Reproductive/Obstetrics                            Anesthesia Physical Anesthesia Plan  ASA: III  Anesthesia Plan: General   Post-op Pain Management:  Regional for Post-op pain   Induction: Intravenous  PONV Risk Score and Plan: 3 and Treatment may vary due to age or medical condition, Ondansetron and Propofol infusion  Airway Management Planned: Oral ETT  Additional Equipment: None  Intra-op Plan:   Post-operative Plan: Extubation in OR  Informed Consent: I have reviewed the patients History and Physical, chart, labs and discussed the procedure including the risks, benefits and alternatives for the proposed anesthesia with the patient or authorized representative who has indicated his/her understanding and acceptance.     Dental advisory given  Plan Discussed with: CRNA and Anesthesiologist  Anesthesia Plan Comments:        Anesthesia Quick  Evaluation

## 2018-08-04 ENCOUNTER — Other Ambulatory Visit: Payer: Self-pay

## 2018-08-04 ENCOUNTER — Inpatient Hospital Stay (HOSPITAL_COMMUNITY): Payer: Medicare Other | Admitting: Anesthesiology

## 2018-08-04 ENCOUNTER — Encounter (HOSPITAL_COMMUNITY): Payer: Self-pay

## 2018-08-04 ENCOUNTER — Inpatient Hospital Stay (HOSPITAL_COMMUNITY)
Admission: RE | Admit: 2018-08-04 | Discharge: 2018-08-07 | DRG: 483 | Disposition: A | Discharge status: Home Health | Payer: Medicare Other | Attending: Orthopedic Surgery | Admitting: Orthopedic Surgery

## 2018-08-04 ENCOUNTER — Inpatient Hospital Stay (HOSPITAL_COMMUNITY): Payer: Medicare Other

## 2018-08-04 ENCOUNTER — Encounter (HOSPITAL_COMMUNITY)
Admission: RE | Disposition: A | Discharge status: Home Health | Payer: Self-pay | Source: Home / Self Care | Attending: Orthopedic Surgery

## 2018-08-04 DIAGNOSIS — I447 Left bundle-branch block, unspecified: Secondary | ICD-10-CM | POA: Diagnosis present

## 2018-08-04 DIAGNOSIS — K219 Gastro-esophageal reflux disease without esophagitis: Secondary | ICD-10-CM | POA: Diagnosis present

## 2018-08-04 DIAGNOSIS — Z91013 Allergy to seafood: Secondary | ICD-10-CM | POA: Diagnosis not present

## 2018-08-04 DIAGNOSIS — W19XXXA Unspecified fall, initial encounter: Secondary | ICD-10-CM | POA: Diagnosis present

## 2018-08-04 DIAGNOSIS — D62 Acute posthemorrhagic anemia: Secondary | ICD-10-CM | POA: Diagnosis not present

## 2018-08-04 DIAGNOSIS — E785 Hyperlipidemia, unspecified: Secondary | ICD-10-CM | POA: Diagnosis present

## 2018-08-04 DIAGNOSIS — I129 Hypertensive chronic kidney disease with stage 1 through stage 4 chronic kidney disease, or unspecified chronic kidney disease: Secondary | ICD-10-CM | POA: Diagnosis present

## 2018-08-04 DIAGNOSIS — R011 Cardiac murmur, unspecified: Secondary | ICD-10-CM | POA: Diagnosis present

## 2018-08-04 DIAGNOSIS — M810 Age-related osteoporosis without current pathological fracture: Secondary | ICD-10-CM | POA: Diagnosis present

## 2018-08-04 DIAGNOSIS — Z96612 Presence of left artificial shoulder joint: Secondary | ICD-10-CM

## 2018-08-04 DIAGNOSIS — Z85820 Personal history of malignant melanoma of skin: Secondary | ICD-10-CM

## 2018-08-04 DIAGNOSIS — N183 Chronic kidney disease, stage 3 (moderate): Secondary | ICD-10-CM | POA: Diagnosis present

## 2018-08-04 DIAGNOSIS — K449 Diaphragmatic hernia without obstruction or gangrene: Secondary | ICD-10-CM | POA: Diagnosis present

## 2018-08-04 DIAGNOSIS — Z79890 Hormone replacement therapy (postmenopausal): Secondary | ICD-10-CM

## 2018-08-04 DIAGNOSIS — Z79899 Other long term (current) drug therapy: Secondary | ICD-10-CM | POA: Diagnosis not present

## 2018-08-04 DIAGNOSIS — Z7982 Long term (current) use of aspirin: Secondary | ICD-10-CM

## 2018-08-04 DIAGNOSIS — S42202A Unspecified fracture of upper end of left humerus, initial encounter for closed fracture: Secondary | ICD-10-CM | POA: Diagnosis present

## 2018-08-04 DIAGNOSIS — Z882 Allergy status to sulfonamides status: Secondary | ICD-10-CM | POA: Diagnosis not present

## 2018-08-04 DIAGNOSIS — M25512 Pain in left shoulder: Secondary | ICD-10-CM | POA: Diagnosis present

## 2018-08-04 DIAGNOSIS — E039 Hypothyroidism, unspecified: Secondary | ICD-10-CM | POA: Diagnosis present

## 2018-08-04 DIAGNOSIS — S4292XA Fracture of left shoulder girdle, part unspecified, initial encounter for closed fracture: Secondary | ICD-10-CM | POA: Diagnosis present

## 2018-08-04 HISTORY — DX: Pneumonia, unspecified organism: J18.9

## 2018-08-04 HISTORY — DX: Major depressive disorder, single episode, unspecified: F32.9

## 2018-08-04 HISTORY — DX: Depression, unspecified: F32.A

## 2018-08-04 HISTORY — DX: Unspecified hearing loss, unspecified ear: H91.90

## 2018-08-04 HISTORY — DX: Other general symptoms and signs: R68.89

## 2018-08-04 HISTORY — PX: REVERSE SHOULDER ARTHROPLASTY: SHX5054

## 2018-08-04 HISTORY — DX: Unspecified osteoarthritis, unspecified site: M19.90

## 2018-08-04 LAB — BASIC METABOLIC PANEL
Anion gap: 14 (ref 5–15)
BUN: 46 mg/dL — ABNORMAL HIGH (ref 8–23)
CO2: 16 mmol/L — ABNORMAL LOW (ref 22–32)
Calcium: 8.8 mg/dL — ABNORMAL LOW (ref 8.9–10.3)
Chloride: 109 mmol/L (ref 98–111)
Creatinine, Ser: 2.11 mg/dL — ABNORMAL HIGH (ref 0.44–1.00)
GFR calc Af Amer: 23 mL/min — ABNORMAL LOW (ref >60)
GFR calc non Af Amer: 20 mL/min — ABNORMAL LOW (ref >60)
Glucose, Bld: 96 mg/dL (ref 70–99)
Potassium: 4 mmol/L (ref 3.5–5.1)
Sodium: 139 mmol/L (ref 135–145)

## 2018-08-04 LAB — CBC
HCT: 29 % — ABNORMAL LOW (ref 36.0–46.0)
Hemoglobin: 9.2 g/dL — ABNORMAL LOW (ref 12.0–15.0)
MCH: 32.1 pg (ref 26.0–34.0)
MCHC: 31.7 g/dL (ref 30.0–36.0)
MCV: 101 fL — ABNORMAL HIGH (ref 80.0–100.0)
Platelets: 239 10*3/uL (ref 150–400)
RBC: 2.87 MIL/uL — ABNORMAL LOW (ref 3.87–5.11)
RDW: 13.9 % (ref 11.5–15.5)
WBC: 9.3 10*3/uL (ref 4.0–10.5)
nRBC: 0 % (ref 0.0–0.2)

## 2018-08-04 LAB — ABO/RH: ABO/RH(D): A POS

## 2018-08-04 SURGERY — ARTHROPLASTY, SHOULDER, TOTAL, REVERSE
Anesthesia: General | Site: Shoulder | Laterality: Left

## 2018-08-04 MED ORDER — MORPHINE SULFATE (PF) 2 MG/ML IV SOLN: 0.5000 mg | INTRAVENOUS | Status: DC | PRN

## 2018-08-04 MED ORDER — PROPOFOL 10 MG/ML IV BOLUS
INTRAVENOUS | Status: AC
  Filled 2018-08-04: qty 20

## 2018-08-04 MED ORDER — BUPIVACAINE LIPOSOME 1.3 % IJ SUSP
INTRAMUSCULAR | Status: DC | PRN
  Administered 2018-08-04: 10 mL via PERINEURAL

## 2018-08-04 MED ORDER — LACTATED RINGERS IV SOLN
INTRAVENOUS | Status: DC | PRN
  Administered 2018-08-04 (×2): via INTRAVENOUS

## 2018-08-04 MED ORDER — LIDOCAINE 2% (20 MG/ML) 5 ML SYRINGE
INTRAMUSCULAR | Status: DC | PRN
  Administered 2018-08-04: 60 mg via INTRAVENOUS

## 2018-08-04 MED ORDER — OMEGA-3-ACID ETHYL ESTERS 1 G PO CAPS
1000.0000 mg | ORAL_CAPSULE | Freq: Two times a day (BID) | ORAL | Status: DC
  Administered 2018-08-04 – 2018-08-07 (×5): 1000 mg via ORAL
  Filled 2018-08-04 (×6): qty 1

## 2018-08-04 MED ORDER — SUCCINYLCHOLINE CHLORIDE 200 MG/10ML IV SOSY
PREFILLED_SYRINGE | INTRAVENOUS | Status: DC | PRN
  Administered 2018-08-04: 100 mg via INTRAVENOUS

## 2018-08-04 MED ORDER — LORATADINE 10 MG PO TABS
10.0000 mg | ORAL_TABLET | Freq: Every day | ORAL | Status: DC
  Administered 2018-08-05 – 2018-08-07 (×3): 10 mg via ORAL
  Filled 2018-08-04 (×3): qty 1

## 2018-08-04 MED ORDER — OXYCODONE HCL 5 MG/5ML PO SOLN: 5.0000 mg | Freq: Once | ORAL | Status: DC | PRN

## 2018-08-04 MED ORDER — MIDAZOLAM HCL 2 MG/2ML IJ SOLN
INTRAMUSCULAR | Status: AC
  Filled 2018-08-04: qty 2

## 2018-08-04 MED ORDER — ONDANSETRON HCL 4 MG/2ML IJ SOLN
INTRAMUSCULAR | Status: DC | PRN
  Administered 2018-08-04: 4 mg via INTRAVENOUS

## 2018-08-04 MED ORDER — BUPIVACAINE HCL (PF) 0.25 % IJ SOLN
INTRAMUSCULAR | Status: DC | PRN
  Administered 2018-08-04: 37.5 mg

## 2018-08-04 MED ORDER — ROCURONIUM BROMIDE 10 MG/ML (PF) SYRINGE
PREFILLED_SYRINGE | INTRAVENOUS | Status: DC | PRN
  Administered 2018-08-04: 20 mg via INTRAVENOUS

## 2018-08-04 MED ORDER — IRBESARTAN 300 MG PO TABS
300.0000 mg | ORAL_TABLET | Freq: Every day | ORAL | Status: DC
  Administered 2018-08-06 – 2018-08-07 (×2): 300 mg via ORAL
  Filled 2018-08-04 (×3): qty 1

## 2018-08-04 MED ORDER — CEFAZOLIN SODIUM-DEXTROSE 2-4 GM/100ML-% IV SOLN: 2.0000 g | Freq: Four times a day (QID) | INTRAVENOUS | Status: DC

## 2018-08-04 MED ORDER — PROPOFOL 500 MG/50ML IV EMUL
INTRAVENOUS | Status: DC | PRN
  Administered 2018-08-04: 50 ug/kg/min via INTRAVENOUS

## 2018-08-04 MED ORDER — SUGAMMADEX SODIUM 200 MG/2ML IV SOLN
INTRAVENOUS | Status: DC | PRN
  Administered 2018-08-04: 50 mg via INTRAVENOUS
  Administered 2018-08-04: 100 mg via INTRAVENOUS

## 2018-08-04 MED ORDER — FUROSEMIDE 20 MG PO TABS
20.0000 mg | ORAL_TABLET | Freq: Every day | ORAL | Status: DC
  Administered 2018-08-04 – 2018-08-07 (×3): 20 mg via ORAL
  Filled 2018-08-04 (×4): qty 1

## 2018-08-04 MED ORDER — OXYCODONE HCL 5 MG PO TABS: 5.0000 mg | ORAL_TABLET | Freq: Once | ORAL | Status: DC | PRN

## 2018-08-04 MED ORDER — DOCUSATE SODIUM 100 MG PO CAPS
100.0000 mg | ORAL_CAPSULE | Freq: Two times a day (BID) | ORAL | Status: DC
  Administered 2018-08-04 – 2018-08-07 (×6): 100 mg via ORAL
  Filled 2018-08-04 (×6): qty 1

## 2018-08-04 MED ORDER — CEFAZOLIN SODIUM-DEXTROSE 2-4 GM/100ML-% IV SOLN
2.0000 g | Freq: Two times a day (BID) | INTRAVENOUS | Status: AC
  Administered 2018-08-04 – 2018-08-05 (×2): 2 g via INTRAVENOUS
  Filled 2018-08-04 (×3): qty 100

## 2018-08-04 MED ORDER — FENTANYL CITRATE (PF) 250 MCG/5ML IJ SOLN
INTRAMUSCULAR | Status: DC | PRN
  Administered 2018-08-04: 50 ug via INTRAVENOUS
  Administered 2018-08-04: 25 ug via INTRAVENOUS

## 2018-08-04 MED ORDER — LEVOTHYROXINE SODIUM 75 MCG PO TABS
75.0000 ug | ORAL_TABLET | Freq: Every day | ORAL | Status: DC
  Administered 2018-08-05 – 2018-08-07 (×3): 75 ug via ORAL
  Filled 2018-08-04 (×3): qty 1

## 2018-08-04 MED ORDER — BUPIVACAINE-EPINEPHRINE 0.25% -1:200000 IJ SOLN
INTRAMUSCULAR | Status: DC | PRN
  Administered 2018-08-04: 15 mL

## 2018-08-04 MED ORDER — ONDANSETRON HCL 4 MG/2ML IJ SOLN: 4.0000 mg | Freq: Four times a day (QID) | INTRAMUSCULAR | Status: DC | PRN

## 2018-08-04 MED ORDER — HYDROCODONE-ACETAMINOPHEN 5-325 MG PO TABS
1.0000 | ORAL_TABLET | ORAL | Status: DC | PRN
  Administered 2018-08-06 (×2): 1 via ORAL
  Filled 2018-08-04 (×2): qty 1

## 2018-08-04 MED ORDER — ONDANSETRON HCL 4 MG PO TABS: 4.0000 mg | ORAL_TABLET | Freq: Four times a day (QID) | ORAL | Status: DC | PRN

## 2018-08-04 MED ORDER — ACETAMINOPHEN 325 MG PO TABS
325.0000 mg | ORAL_TABLET | Freq: Four times a day (QID) | ORAL | Status: DC | PRN
  Administered 2018-08-05 – 2018-08-07 (×3): 650 mg via ORAL
  Filled 2018-08-04 (×3): qty 2

## 2018-08-04 MED ORDER — SODIUM CHLORIDE 0.9 % IV SOLN
INTRAVENOUS | Status: DC
  Administered 2018-08-04: 16:00:00 via INTRAVENOUS

## 2018-08-04 MED ORDER — FENTANYL CITRATE (PF) 100 MCG/2ML IJ SOLN: 25.0000 ug | INTRAMUSCULAR | Status: DC | PRN

## 2018-08-04 MED ORDER — BISACODYL 10 MG RE SUPP: 10.0000 mg | Freq: Every day | RECTAL | Status: DC | PRN

## 2018-08-04 MED ORDER — CHLORHEXIDINE GLUCONATE 4 % EX LIQD: 60.0000 mL | Freq: Once | CUTANEOUS | Status: DC

## 2018-08-04 MED ORDER — ADULT MULTIVITAMIN W/MINERALS CH
1.0000 | ORAL_TABLET | Freq: Every day | ORAL | Status: DC
  Administered 2018-08-04 – 2018-08-07 (×4): 1 via ORAL
  Filled 2018-08-04 (×4): qty 1

## 2018-08-04 MED ORDER — 0.9 % SODIUM CHLORIDE (POUR BTL) OPTIME
TOPICAL | Status: DC | PRN
  Administered 2018-08-04: 1000 mL

## 2018-08-04 MED ORDER — POLYETHYLENE GLYCOL 3350 17 G PO PACK: 17.0000 g | PACK | Freq: Every day | ORAL | Status: DC | PRN

## 2018-08-04 MED ORDER — ONDANSETRON HCL 4 MG/2ML IJ SOLN: 4.0000 mg | Freq: Once | INTRAMUSCULAR | Status: DC | PRN

## 2018-08-04 MED ORDER — PROPOFOL 10 MG/ML IV BOLUS
INTRAVENOUS | Status: DC | PRN
  Administered 2018-08-04: 100 mg via INTRAVENOUS
  Administered 2018-08-04: 25 mg via INTRAVENOUS
  Administered 2018-08-04: 15 mg via INTRAVENOUS
  Administered 2018-08-04: 50 mg via INTRAVENOUS

## 2018-08-04 MED ORDER — HYDROCORTISONE 1 % EX CREA: 1.0000 "application " | TOPICAL_CREAM | Freq: Two times a day (BID) | CUTANEOUS | Status: DC | PRN

## 2018-08-04 MED ORDER — PHENOL 1.4 % MT LIQD: 1.0000 | OROMUCOSAL | Status: DC | PRN

## 2018-08-04 MED ORDER — EPHEDRINE SULFATE-NACL 50-0.9 MG/10ML-% IV SOSY
PREFILLED_SYRINGE | INTRAVENOUS | Status: DC | PRN
  Administered 2018-08-04 (×2): 15 mg via INTRAVENOUS

## 2018-08-04 MED ORDER — CEFAZOLIN SODIUM-DEXTROSE 2-4 GM/100ML-% IV SOLN
2.0000 g | INTRAVENOUS | Status: AC
  Administered 2018-08-04: 2 g via INTRAVENOUS
  Filled 2018-08-04: qty 100

## 2018-08-04 MED ORDER — PANTOPRAZOLE SODIUM 40 MG PO TBEC
40.0000 mg | DELAYED_RELEASE_TABLET | Freq: Every day | ORAL | Status: DC
  Administered 2018-08-05 – 2018-08-07 (×3): 40 mg via ORAL
  Filled 2018-08-04 (×3): qty 1

## 2018-08-04 MED ORDER — KETOCONAZOLE 2 % EX SHAM
1.0000 "application " | MEDICATED_SHAMPOO | CUTANEOUS | Status: DC
  Filled 2018-08-04: qty 120

## 2018-08-04 MED ORDER — METOCLOPRAMIDE HCL 5 MG/ML IJ SOLN: 5.0000 mg | Freq: Three times a day (TID) | INTRAMUSCULAR | Status: DC | PRN

## 2018-08-04 MED ORDER — METOCLOPRAMIDE HCL 5 MG PO TABS: 5.0000 mg | ORAL_TABLET | Freq: Three times a day (TID) | ORAL | Status: DC | PRN

## 2018-08-04 MED ORDER — IPRATROPIUM BROMIDE 0.06 % NA SOLN: 1.0000 | Freq: Four times a day (QID) | NASAL | Status: DC | PRN

## 2018-08-04 MED ORDER — FENTANYL CITRATE (PF) 250 MCG/5ML IJ SOLN
INTRAMUSCULAR | Status: AC
  Filled 2018-08-04: qty 5

## 2018-08-04 MED ORDER — MENTHOL 3 MG MT LOZG: 1.0000 | LOZENGE | OROMUCOSAL | Status: DC | PRN

## 2018-08-04 MED ORDER — HYDROCODONE-ACETAMINOPHEN 5-325 MG PO TABS: 1.0000 | ORAL_TABLET | ORAL | 0 refills | Status: DC | PRN

## 2018-08-04 MED ORDER — BUPIVACAINE-EPINEPHRINE (PF) 0.25% -1:200000 IJ SOLN
INTRAMUSCULAR | Status: AC
  Filled 2018-08-04: qty 30

## 2018-08-04 MED ORDER — SIMVASTATIN 20 MG PO TABS
40.0000 mg | ORAL_TABLET | Freq: Every evening | ORAL | Status: DC
  Administered 2018-08-04 – 2018-08-06 (×3): 40 mg via ORAL
  Filled 2018-08-04 (×3): qty 2

## 2018-08-04 MED ORDER — ASPIRIN 81 MG PO CHEW
81.0000 mg | CHEWABLE_TABLET | Freq: Every day | ORAL | Status: DC
  Administered 2018-08-04 – 2018-08-07 (×4): 81 mg via ORAL
  Filled 2018-08-04 (×4): qty 1

## 2018-08-04 MED ORDER — CLOBETASOL PROPIONATE 0.05 % EX CREA
1.0000 "application " | TOPICAL_CREAM | CUTANEOUS | Status: DC
  Administered 2018-08-07: 1 via TOPICAL
  Filled 2018-08-04: qty 15

## 2018-08-04 MED ORDER — MIRABEGRON ER 25 MG PO TB24
50.0000 mg | ORAL_TABLET | Freq: Every day | ORAL | Status: DC
  Administered 2018-08-05 – 2018-08-07 (×3): 50 mg via ORAL
  Filled 2018-08-04 (×3): qty 2

## 2018-08-04 MED ORDER — PROPOFOL 500 MG/50ML IV EMUL
INTRAVENOUS | Status: AC
  Filled 2018-08-04: qty 50

## 2018-08-04 MED ORDER — SODIUM CHLORIDE 0.9 % IV SOLN
INTRAVENOUS | Status: DC | PRN
  Administered 2018-08-04: 08:00:00 30 ug/min via INTRAVENOUS

## 2018-08-04 SURGICAL SUPPLY — 75 items
AID PSTN UNV HD RSTRNT DISP (MISCELLANEOUS) ×1
BASEPLATE GLENOSPHERE 25 (Plate) ×1 IMPLANT
BEARING HUMERAL SHLDER 36M STD (Shoulder) IMPLANT
BIT DRILL 5/64X5 DISP (BIT) ×2 IMPLANT
BIT DRILL F/CENTRAL SCRW 3.2 (BIT) ×1
BIT DRILL F/CENTRAL SCRW 3.2MM (BIT) IMPLANT
BIT DRILL TWIST 2.7 (BIT) ×1 IMPLANT
BLADE SAG 18X100X1.27 (BLADE) ×2 IMPLANT
BRNG HUM STD 36 RVRS SHLDR (Shoulder) ×1 IMPLANT
COVER SURGICAL LIGHT HANDLE (MISCELLANEOUS) ×2 IMPLANT
COVER WAND RF STERILE (DRAPES) ×2 IMPLANT
DRAPE INCISE IOBAN 66X45 STRL (DRAPES) ×2 IMPLANT
DRAPE ORTHO SPLIT 77X108 STRL (DRAPES) ×4
DRAPE SURG ORHT 6 SPLT 77X108 (DRAPES) ×2 IMPLANT
DRAPE U-SHAPE 47X51 STRL (DRAPES) ×2 IMPLANT
DRILL BIT F/CENTRAL SCRW 3.2MM (BIT) ×2
DRSG ADAPTIC 3X8 NADH LF (GAUZE/BANDAGES/DRESSINGS) ×2 IMPLANT
DRSG PAD ABDOMINAL 8X10 ST (GAUZE/BANDAGES/DRESSINGS) ×2 IMPLANT
DURAPREP 26ML APPLICATOR (WOUND CARE) ×2 IMPLANT
ELECT BLADE 4.0 EZ CLEAN MEGAD (MISCELLANEOUS) ×2
ELECT NDL TIP 2.8 STRL (NEEDLE) ×1 IMPLANT
ELECT NEEDLE TIP 2.8 STRL (NEEDLE) ×2 IMPLANT
ELECT REM PT RETURN 9FT ADLT (ELECTROSURGICAL) ×2
ELECTRODE BLDE 4.0 EZ CLN MEGD (MISCELLANEOUS) ×1 IMPLANT
ELECTRODE REM PT RTRN 9FT ADLT (ELECTROSURGICAL) ×1 IMPLANT
GAUZE SPONGE 4X4 12PLY STRL (GAUZE/BANDAGES/DRESSINGS) ×2 IMPLANT
GLENOID SPHERE STD STRL 36MM (Orthopedic Implant) ×1 IMPLANT
GLOVE BIOGEL PI ORTHO PRO 7.5 (GLOVE) ×1
GLOVE BIOGEL PI ORTHO PRO SZ8 (GLOVE) ×1
GLOVE ORTHO TXT STRL SZ7.5 (GLOVE) ×2 IMPLANT
GLOVE PI ORTHO PRO STRL 7.5 (GLOVE) ×1 IMPLANT
GLOVE PI ORTHO PRO STRL SZ8 (GLOVE) ×1 IMPLANT
GLOVE SURG ORTHO 8.5 STRL (GLOVE) ×2 IMPLANT
GOWN STRL REUS W/ TWL LRG LVL3 (GOWN DISPOSABLE) ×1 IMPLANT
GOWN STRL REUS W/ TWL XL LVL3 (GOWN DISPOSABLE) ×2 IMPLANT
GOWN STRL REUS W/TWL LRG LVL3 (GOWN DISPOSABLE) ×2
GOWN STRL REUS W/TWL XL LVL3 (GOWN DISPOSABLE) ×4
KIT BASIN OR (CUSTOM PROCEDURE TRAY) ×2 IMPLANT
KIT TURNOVER KIT B (KITS) ×2 IMPLANT
MANIFOLD NEPTUNE II (INSTRUMENTS) ×2 IMPLANT
NDL 1/2 CIR MAYO (NEEDLE) ×1 IMPLANT
NDL HYPO 25GX1X1/2 BEV (NEEDLE) ×1 IMPLANT
NEEDLE 1/2 CIR MAYO (NEEDLE) ×2 IMPLANT
NEEDLE HYPO 25GX1X1/2 BEV (NEEDLE) ×2 IMPLANT
NS IRRIG 1000ML POUR BTL (IV SOLUTION) ×2 IMPLANT
PACK SHOULDER (CUSTOM PROCEDURE TRAY) ×2 IMPLANT
PAD ABD 8X10 STRL (GAUZE/BANDAGES/DRESSINGS) ×1 IMPLANT
PAD ARMBOARD 7.5X6 YLW CONV (MISCELLANEOUS) ×4 IMPLANT
PIN THREADED REVERSE (PIN) ×1 IMPLANT
RESTRAINT HEAD UNIVERSAL NS (MISCELLANEOUS) ×2 IMPLANT
SCREW BONE LOCKING 4.75X30X3.5 (Screw) ×1 IMPLANT
SCREW BONE LOCKING 4.75X35X3.5 (Screw) ×1 IMPLANT
SCREW BONE STRL 6.5MMX25MM (Screw) ×1 IMPLANT
SCREW CENTRAL 6.5X20MM (Screw) ×1 IMPLANT
SHOULDER HUMERAL BEAR 36M STD (Shoulder) ×2 IMPLANT
SPONGE LAP 18X18 RF (DISPOSABLE) IMPLANT
SPONGE LAP 4X18 RFD (DISPOSABLE) ×2 IMPLANT
STEM HUMERAL STRL 10MMX122MM (Stem) ×1 IMPLANT
STRIP CLOSURE SKIN 1/2X4 (GAUZE/BANDAGES/DRESSINGS) ×2 IMPLANT
SUCTION FRAZIER HANDLE 10FR (MISCELLANEOUS) ×1
SUCTION TUBE FRAZIER 10FR DISP (MISCELLANEOUS) ×1 IMPLANT
SUT FIBERWIRE #2 38 T-5 BLUE (SUTURE)
SUT MNCRL AB 4-0 PS2 18 (SUTURE) ×2 IMPLANT
SUT VIC AB 0 CT2 27 (SUTURE) ×2 IMPLANT
SUT VIC AB 2-0 CT1 27 (SUTURE) ×2
SUT VIC AB 2-0 CT1 TAPERPNT 27 (SUTURE) ×1 IMPLANT
SUT VICRYL 0 CT 1 36IN (SUTURE) ×2 IMPLANT
SUTURE FIBERWR #2 38 T-5 BLUE (SUTURE) IMPLANT
SYR CONTROL 10ML LL (SYRINGE) ×2 IMPLANT
TAPE CLOTH SURG 6X10 WHT LF (GAUZE/BANDAGES/DRESSINGS) ×1 IMPLANT
TOWEL OR 17X24 6PK STRL BLUE (TOWEL DISPOSABLE) ×2 IMPLANT
TOWEL OR 17X26 10 PK STRL BLUE (TOWEL DISPOSABLE) ×2 IMPLANT
TOWER CARTRIDGE SMART MIX (DISPOSABLE) IMPLANT
TRAY HUM MINI SHOULDER +0 40D (Shoulder) ×1 IMPLANT
YANKAUER SUCT BULB TIP NO VENT (SUCTIONS) ×2 IMPLANT

## 2018-08-04 NOTE — Anesthesia Procedure Notes (Signed)
Anesthesia Regional Block: Interscalene brachial plexus block   Pre-Anesthetic Checklist: ,, timeout performed, Correct Patient, Correct Site, Correct Laterality, Correct Procedure, Correct Position, site marked, Risks and benefits discussed,  Surgical consent,  Pre-op evaluation,  At surgeon's request and post-op pain management  Laterality: Left  Prep: chloraprep       Needles:  Injection technique: Single-shot  Needle Type: Echogenic Needle     Needle Length: 5cm  Needle Gauge: 21     Additional Needles:   Narrative:  Start time: 08/04/2018 6:57 AM End time: 08/04/2018 7:00 AM Injection made incrementally with aspirations every 5 mL.  Performed by: Personally  Anesthesiologist: Audry Pili, MD  Additional Notes: No pain on injection. No increased resistance to injection. Injection made in 5cc increments. Good needle visualization. Patient tolerated the procedure well.

## 2018-08-04 NOTE — Op Note (Signed)
NAMEKATHIA, [name] MEDICAL RECORD [id number] ACCOUNT [id number] DATE OF BIRTH:1922/06/27 FACILITY: [facility] LOCATION: [place], [place]  OPERATIVE REPORT  DATE OF PROCEDURE:  08/04/2018  PREOPERATIVE DIAGNOSIS:  Comminuted and displaced left proximal humerus fracture.  POSTOPERATIVE DIAGNOSIS:  Comminuted and displaced left proximal humerus fracture.  PROCEDURE PERFORMED:  Left shoulder reverse total shoulder arthroplasty using Biomet comprehensive system with tuberosity repair.  ATTENDING SURGEON:  Netta Cedars, MD  ASSISTANT:  Darol Destine, [place], who was scrubbed during the entire procedure and necessary for satisfactory completion of surgery.  ANESTHESIA:  General anesthesia was used plus interscalene block.  ESTIMATED BLOOD LOSS:  Less than 100 mL.  FLUID REPLACEMENT:  1200 mL crystalloid.  INSTRUMENT COUNTS:  Correct.  COMPLICATIONS:  No complications.  ANTIBIOTICS:  Perioperative antibiotics given.  INDICATIONS:  The patient is a 85 year old female who suffered a fall injuring her left shoulder.  The patient presented with a grossly displaced proximal humerus fracture.  The patient has had ongoing pain and severe debilitation due to her injured  shoulder.  We discussed options for management with the patient, including conservative and surgical treatment options.  We recommended reverse total shoulder arthroplasty to allow the patient to have immediate active motion and use her shoulder for  ADLs.  She agreed with this.  Informed consent obtained.  DESCRIPTION OF PROCEDURE:  After an adequate level of anesthesia was achieved, the patient was positioned in modified beach chair position.  Left shoulder correctly identified and sterilely prepped and draped in the usual manner.  Timeout called.  We  entered the patient's shoulder using standard deltopectoral approach starting the coracoid process extending down to the anterior humerus.  Dissection  down through subcutaneous tissues.  We identified the cephalic vein and took it laterally with the  deltoid pectoralis taken medially.  We identified the conjoined tendon, retracted medially, placed our deep retractors.  We tenodesed the biceps in situ with 0 Vicryl figure-of-eight suture.  We then released the soft tissue.  There was a clear deformity  consistent with a fractured shoulder.  We then released the rotator interval area and in the soft tissue in-between the lesser tuberosity and greater tuberosity.  I used an osteotome to take a very thin layer of the lesser tuberosity off of the humeral  head.  We then noted the humeral head to be fractured and rotated posteriorly.  We removed that and took that off of the greater tuberosity where there was still some attachments.  We debulked the tuberosities, placed #2 FiberWire suture in a modified  Mason-Allen suture technique, medial to the lesser tuberosity and lateral to the greater tuberosity.  We did use a mattress with that as well  Basically, the sutures were coming up over the top of the tendon and at this point, we had 2 on the lesser and  3 on the greater.  We then went ahead and gained exposure to our glenoid face with our retractors.  Once we had full exposure, we removed the glenoid labrum.  We removed the remaining cartilage.  We placed our guide for the guide pin for the Biomet  comprehensive reverse shoulder system which was inclined a little inferiorly.  Once we drilled that drillbit, we then went ahead and reamed for the baseplate.  Once we had the baseplate reaming done, then we measured our depth for our screw.  Once we  impacted our baseplate into position and we placed a 25 compression screw, which gained good purchase, we  then placed a superior screw, which was a 30 and an inferior screw which was a 35 and those were  both locked.  We had great baseplate fixation.   Selected a 36 standard glenosphere and went ahead and impacted that  in position.  We had it set on the B setting and dialed anterior inferiorly for best coverage.  We had excellent positioning of the glenosphere.  We then irrigated thoroughly and we were  protecting the axillary nerve the entire time.  We came out to the humeral side.  We reamed up to for a size 10 stem and we placed our 10 fracture stem in 20 degrees of retroversion.  We then trialled with a standard tray with the mini tray with the  standard poly and then reduced the shoulder.  We had excellent stability and we then reduced the tuberosities and they were in anatomic position.  So, we removed the trial components from the humeral side.  We irrigated thoroughly, drilled holes in  lesser tuberosity and right around the biceps groove for the repair of the tuberosities to the shaft.  Once we had those holes drilled, we placed sutures through them coming out with a loop on the inside of the bone and the sutures coming out the outside  of the bone.  We then went ahead and placed an around the world stitch through the real [place], size 10 fracture stem and then we impacted the stem into position, again 20 degrees of retroversion and had great security.  It did not need any cement.   At this point, we selected our mini tray and a standard poly and impacted that in position with the [facility] taper.  We then reduced the shoulder, had nice little pop seeding, not over tensioned at all.  We then reduced the tuberosities into position.  We  placed around the world stitch medial to the lesser tuberosity and lateral to the greater tuberosity.  We extensively bone grafted the proximal humerus, the component and then we laid the tuberosities in position.  We tied tuberosity to tuberosity.  We  also repaired the rotator interval.  We then tied the shaft sutures up in a mattress fashion over the tuberosities, lesser and the greater, with outstanding compression and then finally the around the world stitch.  We had everything  moving together as a  unit nicely with it really what looked like an anatomic repair of all the tuberosity and rotator cuff tendons.  This did not limit range of motion at all.  We had a nice smooth motion throughout a full arc and at this point, we irrigated thoroughly,  closed deltopectoral interval with 0 Vicryl suture followed by 2-0 Vicryl for subcutaneous closure, 4-0 Monocryl for skin.  Steri-Strips applied followed by sterile dressing.    The patient tolerated surgery well.  AN/NUANCE  D:08/04/2018 T:08/04/2018 JOB:[id number]

## 2018-08-04 NOTE — Interval H&P Note (Signed)
History and Physical Interval Note:  08/04/2018 7:29 AM  Cindy [name]  has presented today for surgery, with the diagnosis of LEFT PROXIMAL HUMEROUS FRACTURE.  The various methods of treatment have been discussed with the patient and family. After consideration of risks, benefits and other options for treatment, the patient has consented to  Procedure(s): REVERSE LEFT SHOULDER ARTHROPLASTY (Left) as a surgical intervention.  The patient's history has been reviewed, patient examined, no change in status, stable for surgery.  I have reviewed the patient's chart and labs.  Questions were answered to the patient's satisfaction.     Cindy [name]

## 2018-08-04 NOTE — Brief Op Note (Signed)
08/04/2018  9:42 AM  PATIENT:  Cindy [name]  85 y.o. female  PRE-OPERATIVE DIAGNOSIS:  LEFT PROXIMAL HUMERUS FRACTURE  POST-OPERATIVE DIAGNOSIS:  LEFT PROXIMAL HUMERUS FRACTURE  PROCEDURE:  Procedure(s): REVERSE LEFT SHOULDER ARTHROPLASTY (Left) with tuberosity repair, BIOMET Comprehensive with ZImmer fracture stem  SURGEON:  Surgeon(s) and Role:    Netta Cedars, MD - Primary  PHYSICIAN ASSISTANT:   ASSISTANTS: [place] Bruns, PA-C   ANESTHESIA:   regional and general  EBL:  20 mL   BLOOD ADMINISTERED:none  DRAINS: none   LOCAL MEDICATIONS USED:  MARCAINE     SPECIMEN:  No Specimen  DISPOSITION OF SPECIMEN:  N/A  COUNTS:  YES  TOURNIQUET:  * No tourniquets in log *  DICTATION: .Other Dictation: Dictation Number [id number]  PLAN OF CARE: Admit to inpatient   PATIENT DISPOSITION:  PACU - hemodynamically stable.   Delay start of Pharmacological VTE agent (>24hrs) due to surgical blood loss or risk of bleeding: not applicable

## 2018-08-04 NOTE — Anesthesia Procedure Notes (Signed)
Procedure Name: Intubation Date/Time: 08/04/2018 7:45 AM Performed by: Jearld Pies, CRNA Pre-anesthesia Checklist: Patient identified, Emergency Drugs available, Suction available and Patient being monitored Patient Re-evaluated:Patient Re-evaluated prior to induction Oxygen Delivery Method: Circle System Utilized Preoxygenation: Pre-oxygenation with 100% oxygen Induction Type: IV induction and Rapid sequence Laryngoscope Size: Mac and 3 Grade View: Grade I Tube type: Oral Tube size: 7.0 mm Number of attempts: 1 Airway Equipment and Method: Stylet and Oral airway Placement Confirmation: ETT inserted through vocal cords under direct vision,  positive ETCO2 and breath sounds checked- equal and bilateral Secured at: 21 cm Tube secured with: Tape Dental Injury: Teeth and Oropharynx as per pre-operative assessment

## 2018-08-04 NOTE — Anesthesia Postprocedure Evaluation (Signed)
Anesthesia Post Note  Patient: Cindy [name]  Procedure(s) Performed: REVERSE LEFT SHOULDER ARTHROPLASTY (Left Shoulder)     Patient location during evaluation: PACU Anesthesia Type: General Level of consciousness: awake and alert Pain management: pain level controlled Vital Signs Assessment: post-procedure vital signs reviewed and stable Respiratory status: spontaneous breathing, nonlabored ventilation and respiratory function stable Cardiovascular status: blood pressure returned to baseline and stable Postop Assessment: no apparent nausea or vomiting Anesthetic complications: no    Last Vitals:  Vitals:   08/04/18 0954 08/04/18 0955  BP: 137/61   Pulse: (!) 57 (!) 54  Resp: 11 12  Temp:  (!) 36.3 C  SpO2: 100% 100%    Last Pain:  Vitals:   08/04/18 1045  PainSc: 0-No pain                 Audry Pili

## 2018-08-04 NOTE — Transfer of Care (Signed)
Immediate Anesthesia Transfer of Care Note  Patient: Cindy [name]  Procedure(s) Performed: REVERSE LEFT SHOULDER ARTHROPLASTY (Left Shoulder)  Patient Location: PACU  Anesthesia Type:General and Regional  Level of Consciousness: awake, alert  and oriented  Airway & Oxygen Therapy: Patient Spontanous Breathing and Patient connected to face mask oxygen  Post-op Assessment: Report given to RN and Post -op Vital signs reviewed and stable  Post vital signs: Reviewed and stable  Last Vitals:  Vitals Value Taken Time  BP 137/61 08/04/2018  9:54 AM  Temp    Pulse 57 08/04/2018  9:54 AM  Resp 11 08/04/2018  9:54 AM  SpO2 100 % 08/04/2018  9:54 AM  Vitals shown include unvalidated device data.  Last Pain:  Vitals:   08/04/18 0601  PainSc: 0-No pain         Complications: No apparent anesthesia complications

## 2018-08-04 NOTE — Progress Notes (Signed)
Maurice March (homeaide/caregiver) called and went over patient's last dose for each med. Also, he is to be called post-op and updated per pt.

## 2018-08-05 LAB — HEMOGLOBIN AND HEMATOCRIT, BLOOD
HCT: 25.6 % — ABNORMAL LOW (ref 36.0–46.0)
Hemoglobin: 8.3 g/dL — ABNORMAL LOW (ref 12.0–15.0)

## 2018-08-05 LAB — BASIC METABOLIC PANEL
Anion gap: 9 (ref 5–15)
BUN: 38 mg/dL — ABNORMAL HIGH (ref 8–23)
CO2: 19 mmol/L — ABNORMAL LOW (ref 22–32)
Calcium: 8 mg/dL — ABNORMAL LOW (ref 8.9–10.3)
Chloride: 110 mmol/L (ref 98–111)
Creatinine, Ser: 1.81 mg/dL — ABNORMAL HIGH (ref 0.44–1.00)
GFR calc Af Amer: 27 mL/min — ABNORMAL LOW (ref >60)
GFR calc non Af Amer: 24 mL/min — ABNORMAL LOW (ref >60)
Glucose, Bld: 97 mg/dL (ref 70–99)
Potassium: 4.2 mmol/L (ref 3.5–5.1)
Sodium: 138 mmol/L (ref 135–145)

## 2018-08-05 NOTE — Progress Notes (Signed)
Occupational Therapy Evaluation Patient Details Name: Cindy [name] MRN: [id number] DOB: 1922/12/23 Today's Date: 08/05/2018    History of Present Illness Pt admitted with left proximal humerus fx s/p mechanical fall at home. Pt now s/p reverse left shoulder arthroplasty on 4/25.   Clinical Impression   Pt admitted with above diagnosis.  Pt presenting with decreased independence with ADLs and functional mobility due to left UE function. PTA pt is independent with BADLs and has an aide who assists her 4 hours a day Monday-Friday.  Pt reports her aide can provide 24/7 assist.  Pt also c/o pain in right big toe which was very distracting for her.  Her right big toe is inflamed.  Therapist began self care and ROM education. However, pt will require at least one more OT treatment to complete education before she discharges home.  Recommending HHOT to assist with further progress rehab with self care/ADLs.  Will continue to follow acutely in order to maximize safety and independence with ADLs and to provide HEP for home.     Follow Up Recommendations  Home health OT;Supervision/Assistance - 24 hour;Follow surgeon's recommendation for DC plan and follow-up therapies    Equipment Recommendations  None recommended by OT    Recommendations for Other Services       Precautions / Restrictions Precautions Precautions: Shoulder;Fall Shoulder Interventions: Shoulder sling/immobilizer;For comfort(for sleep) Precaution Booklet Issued: Yes (comment) Precaution Comments: AROM/PROM shoulder flexion 90, abduction 60, external rotation 30. Required Braces or Orthoses: Sling Restrictions Weight Bearing Restrictions: Yes LUE Weight Bearing: Non weight bearing      Mobility Bed Mobility Overal bed mobility: Needs Assistance Bed Mobility: Supine to Sit     Supine to sit: Mod assist     General bed mobility comments: Assist to elevate runk and scoot hips EOB.  Transfers Overall transfer level: Needs  assistance   Transfers: Sit to/from Stand;Stand Pivot Transfers Sit to Stand: Min guard Stand pivot transfers: Min guard       General transfer comment: Min guard for safety and right hand held assist.     Balance                                           ADL either performed or assessed with clinical judgement   ADL Overall ADL's : Needs assistance/impaired Eating/Feeding: Set up;Sitting   Grooming: Wash/dry hands;Supervision/safety;Sitting   Upper Body Bathing: Moderate assistance;Sitting   Lower Body Bathing: Minimal assistance;Sit to/from stand   Upper Body Dressing : Maximal assistance;Sitting   Lower Body Dressing: Moderate assistance;Sit to/from stand   Toilet Transfer: Min Statistician Details (indicate cue type and reason): single hand held assist during toilet transfer [place] and Hygiene: Min guard;Sit to/from stand       Functional mobility during ADLs: Min guard(hand held assist) General ADL Comments: Therapist provided self care handout and hand,wrist,elbow,shoulder ROM handouts. Educated pt on self care and positioning as well as ROM. Pt able to practice hand,wrist, elbow ROM during session but unable to to complete shoulder ROM at this time due to distracted by pain in toe.      Vision Baseline Vision/History: Wears glasses Wears Glasses: Reading only(glasses not present in room)       Perception     Praxis      Pertinent Vitals/Pain Pain Assessment: Faces Faces Pain Scale: Hurts even more Pain Location: right big  toe Pain Descriptors / Indicators: Sharp Pain Intervention(s): Monitored during session;Repositioned;Patient requesting pain meds-RN notified     Hand Dominance Right   Extremity/Trunk Assessment Upper Extremity Assessment Upper Extremity Assessment: LUE deficits/detail LUE Deficits / Details: left shoulder limitations s/p sx. Hand,wrist and elbow AROM WFL.    Lower Extremity Assessment Lower Extremity Assessment: Defer to PT evaluation       Communication Communication Communication: HOH   Cognition Arousal/Alertness: Awake/alert Behavior During Therapy: WFL for tasks assessed/performed Overall Cognitive Status: Within Functional Limits for tasks assessed                                     General Comments       Exercises Exercises: Shoulder;General Upper Extremity General Exercises - Upper Extremity Elbow Flexion: AROM;Left;5 reps;Seated Elbow Extension: AROM;Left;5 reps;Seated Wrist Flexion: AROM;Left;5 reps;Seated Wrist Extension: AROM;Left;5 reps;Seated Digit Composite Flexion: AROM;Left;5 reps;Seated Composite Extension: AROM;Left;5 reps;Seated   Shoulder Instructions Shoulder Instructions Donning/doffing sling/immobilizer: Moderate assistance Correct positioning of sling/immobilizer: Maximal assistance    Home Living Family/patient expects to be discharged to:: Private residence Living Arrangements: Alone Available Help at Discharge: Personal care attendant Type of Home: House Home Access: Ramped entrance     Home Layout: One level     Bathroom Shower/Tub: Occupational psychologist: Standard     Home Equipment: Bedside commode;Shower seat - built in;Wheelchair - manual   Additional Comments: Pt reports aide can be available 24/7 if needed.       Prior Functioning/Environment Level of Independence: Independent        Comments: Aide assists with making lunch and driving her.        OT Problem List: Decreased strength;Decreased range of motion;Impaired balance (sitting and/or standing);Decreased activity tolerance;Decreased knowledge of use of DME or AE;Decreased knowledge of precautions;Impaired UE functional use;Pain      OT Treatment/Interventions: Self-care/ADL training;Therapeutic exercise;DME and/or AE instruction;Therapeutic activities;Patient/family education;Balance  training    OT Goals(Current goals can be found in the care plan section) Acute Rehab OT Goals Patient Stated Goal: to go home OT Goal Formulation: With patient Time For Goal Achievement: 08/19/18 Potential to Achieve Goals: Good  OT Frequency: Min 3X/week   Barriers to D/C:            Co-evaluation              AM-PAC OT "6 Clicks" Daily Activity     Outcome Measure Help from another person eating meals?: A Little Help from another person taking care of personal grooming?: A Little Help from another person toileting, which includes using toliet, bedpan, or urinal?: A Little Help from another person bathing (including washing, rinsing, drying)?: A Lot Help from another person to put on and taking off regular upper body clothing?: A Lot Help from another person to put on and taking off regular lower body clothing?: A Lot 6 Click Score: 15   End of Session Equipment Utilized During Treatment: (left shoulder sling) Nurse Communication: Mobility status;Patient requests pain meds  Activity Tolerance: Patient tolerated treatment well Patient left: in chair;with call bell/phone within reach;with chair alarm set  OT Visit Diagnosis: Unsteadiness on feet (R26.81);History of falling (Z91.81);Pain                Time: 2025-4270 OT Time Calculation (min): 35 min Charges:  OT General Charges $OT Visit: 1 Visit OT Evaluation $OT Eval Moderate Complexity: 1 Mod OT  Treatments $Self Care/Home Management : 8-22 mins   Darrol Jump OTR/L [place] [phone] 08/05/2018, 9:13 AM

## 2018-08-05 NOTE — Progress Notes (Signed)
Orthopedics Progress Note  Subjective: Patient still comfortable due to block. No pain.   Objective:  Vitals:   08/04/18 2330 08/05/18 0428  BP: (!) 110/48 (!) 105/47  Pulse: 74 70  Resp:    Temp: 98.5 F (36.9 C) 98.3 F (36.8 C)  SpO2: 97% 97%    General: Awake and alert  Musculoskeletal: Left shoulder dressing intact, mod ecchymosis and swelling in the arm. Hand not swollen. Some active ROM in the hand but still evidence of ISB present. Warm and well perfused Neurovascularly intact  Lab Results  Component Value Date   WBC 9.3 08/04/2018   HGB 8.3 (L) 08/05/2018   HCT 25.6 (L) 08/05/2018   MCV 101.0 (H) 08/04/2018   PLT 239 08/04/2018       Component Value Date/Time   NA 138 08/05/2018 0244   NA 143 11/04/2014 1431   K 4.2 08/05/2018 0244   K 4.3 11/04/2014 1431   CL 110 08/05/2018 0244   CO2 19 (L) 08/05/2018 0244   CO2 25 11/04/2014 1431   GLUCOSE 97 08/05/2018 0244   GLUCOSE 99 11/04/2014 1431   BUN 38 (H) 08/05/2018 0244   BUN 32.6 (H) 11/04/2014 1431   CREATININE 1.81 (H) 08/05/2018 0244   CREATININE 1.4 (H) 11/04/2014 1431   CALCIUM 8.0 (L) 08/05/2018 0244   CALCIUM 10.0 11/04/2014 1431   GFRNONAA 24 (L) 08/05/2018 0244   GFRAA 27 (L) 08/05/2018 0244    Lab Results  Component Value Date   INR 1.03 06/25/2010   INR 0.98 03/17/2009    Assessment/Plan: POD #1 s/p Procedure(s): REVERSE LEFT SHOULDER ARTHROPLASTY PT, OT, mobilization Possible discharge later today but likely home tomorrow. Chronic renal insufficiency - gentle fluids  Doran Heater. Veverly Fells, MD 08/05/2018 7:10 AM

## 2018-08-05 NOTE — Plan of Care (Signed)
  Problem: Coping: Goal: Level of anxiety will decrease Outcome: Progressing   Problem: Pain Managment: Goal: General experience of comfort will improve Outcome: Progressing   

## 2018-08-05 NOTE — Evaluation (Addendum)
Physical Therapy Evaluation Patient Details Name: Cindy [name] MRN: [id number] DOB: 1922-10-20 Today's Date: 08/05/2018   History of Present Illness  Pt admitted with left proximal humerus fx s/p mechanical fall at home. Pt now s/p reverse left shoulder arthroplasty on 4/25. Significant PMH of CKD and osteoporosis.  Clinical Impression  Pt admitted with above diagnosis. Pt currently with functional limitations due to the deficits listed below (see PT Problem List). Prior to admission, pt uses a walker for mobility and has an aide who assists with ADL's/IADL's 4 hours daily. Pt reports her aide can provide 24/7 assistance. Currently, pt limited in functional mobility by pain (RN notified), decreased balance, and activity tolerance. Pt ambulating 10 feet using cane and min guard assist, limited in distance by increased right toe pain. Noted right first toe inflamed, not tender to palpation and pain not reproduced with active or passive range of motion. RN notified. Pt will benefit from skilled PT to increase their independence and safety with mobility to allow discharge to the venue listed below.        Follow Up Recommendations Home health PT;Supervision/Assistance - 24 hour    Equipment Recommendations  Cane    Recommendations for Other Services       Precautions / Restrictions Precautions Precautions: Shoulder;Fall Shoulder Interventions: Shoulder sling/immobilizer;For comfort(for sleep) Precaution Booklet Issued: Yes (comment) Precaution Comments: AROM/PROM shoulder flexion 90, abduction 60, external rotation 30. Required Braces or Orthoses: Sling Restrictions Weight Bearing Restrictions: Yes LUE Weight Bearing: Non weight bearing      Mobility  Bed Mobility Overal bed mobility: Needs Assistance Bed Mobility: Supine to Sit     Supine to sit: Mod assist     General bed mobility comments: OOB in chair  Transfers Overall transfer level: Needs assistance   Transfers: Sit  to/from Stand;Stand Pivot Transfers Sit to Stand: Min assist Stand pivot transfers: Min guard       General transfer comment: Min assist for initial lifting  Ambulation/Gait Ambulation/Gait assistance: Min guard Gait Distance (Feet): 10 Feet Assistive device: Straight cane Gait Pattern/deviations: Step-to pattern;Decreased stride length;Trunk flexed Gait velocity: decreased Gait velocity interpretation: <1.31 ft/sec, indicative of household ambulator General Gait Details: Pt with slow speed, moderately relying on cane, limited in distance by right first toe pain, requesting to sit back down  Stairs            Wheelchair Mobility    Modified Rankin (Stroke Patients Only)       Balance Overall balance assessment: Needs assistance Sitting-balance support: Feet supported Sitting balance-Leahy Scale: Good     Standing balance support: Single extremity supported;During functional activity Standing balance-Leahy Scale: Poor Standing balance comment: reliant on single UE support                             Pertinent Vitals/Pain Pain Assessment: Faces Faces Pain Scale: Hurts whole lot Pain Location: right big toe Pain Descriptors / Indicators: Sharp Pain Intervention(s): Monitored during session;Premedicated before session    Home Living Family/patient expects to be discharged to:: Private residence Living Arrangements: Alone Available Help at Discharge: Personal care attendant Type of Home: House Home Access: Ramped entrance     Home Layout: One level Home Equipment: Bedside commode;Shower seat - built in;Wheelchair - manual Additional Comments: Pt reports aide can be available 24/7 if needed.     Prior Function Level of Independence: Independent         Comments: Aide assists with  making lunch and driving her.     Hand Dominance   Dominant Hand: Right    Extremity/Trunk Assessment   Upper Extremity Assessment Upper Extremity  Assessment: LUE deficits/detail LUE Deficits / Details: left shoulder limitations s/p sx. Hand,wrist and elbow AROM WFL.    Lower Extremity Assessment Lower Extremity Assessment: Generalized weakness    Cervical / Trunk Assessment Cervical / Trunk Assessment: Kyphotic  Communication   Communication: HOH  Cognition Arousal/Alertness: Awake/alert Behavior During Therapy: WFL for tasks assessed/performed Overall Cognitive Status: Within Functional Limits for tasks assessed                                        General Comments      Exercises General Exercises - Upper Extremity Elbow Flexion: AROM;Left;5 reps;Seated Elbow Extension: AROM;Left;5 reps;Seated Wrist Flexion: AROM;Left;5 reps;Seated Wrist Extension: AROM;Left;5 reps;Seated Digit Composite Flexion: AROM;Left;5 reps;Seated Composite Extension: AROM;Left;5 reps;Seated Donning/doffing sling/immobilizer: Moderate assistance Correct positioning of sling/immobilizer: Maximal assistance   Assessment/Plan    PT Assessment Patient needs continued PT services  PT Problem List Decreased strength;Decreased activity tolerance;Decreased balance;Decreased mobility;Pain       PT Treatment Interventions DME instruction;Gait training;Functional mobility training;Therapeutic activities;Therapeutic exercise;Balance training;Patient/family education    PT Goals (Current goals can be found in the Care Plan section)  Acute Rehab PT Goals Patient Stated Goal: to go home PT Goal Formulation: With patient Time For Goal Achievement: 08/19/18 Potential to Achieve Goals: Good    Frequency Min 5X/week   Barriers to discharge        Co-evaluation               AM-PAC PT "6 Clicks" Mobility  Outcome Measure Help needed turning from your back to your side while in a flat bed without using bedrails?: A Little Help needed moving from lying on your back to sitting on the side of a flat bed without using bedrails?:  A Little Help needed moving to and from a bed to a chair (including a wheelchair)?: A Little Help needed standing up from a chair using your arms (e.g., wheelchair or bedside chair)?: A Little Help needed to walk in hospital room?: A Little Help needed climbing 3-5 steps with a railing? : A Lot 6 Click Score: 17    End of Session Equipment Utilized During Treatment: Gait belt;Other (comment)(shoulder sling) Activity Tolerance: Patient limited by pain Patient left: in chair;with call bell/phone within reach;with chair alarm set Nurse Communication: Other (comment)(pain) PT Visit Diagnosis: Other abnormalities of gait and mobility (R26.89);Unsteadiness on feet (R26.81);Difficulty in walking, not elsewhere classified (R26.2)    Time: 9798-9211 PT Time Calculation (min) (ACUTE ONLY): 20 min   Charges:   PT Evaluation $PT Eval Moderate Complexity: 1 Mod         Ellamae Sia, [place], DPT Acute Rehabilitation Services Pager [phone] Office [phone]   Willy Eddy 08/05/2018, 10:57 AM

## 2018-08-05 NOTE — Plan of Care (Signed)
  Problem: Pain Managment: Goal: General experience of comfort will improve Outcome: Progressing   

## 2018-08-06 LAB — HEMOGLOBIN AND HEMATOCRIT, BLOOD
HCT: 24.3 % — ABNORMAL LOW (ref 36.0–46.0)
Hemoglobin: 7.8 g/dL — ABNORMAL LOW (ref 12.0–15.0)

## 2018-08-06 MED ORDER — SODIUM CHLORIDE 0.9% IV SOLUTION
Freq: Once | INTRAVENOUS | Status: AC
  Administered 2018-08-06: 22:00:00 via INTRAVENOUS

## 2018-08-06 MED ORDER — ACETAMINOPHEN 325 MG PO TABS: 650.0000 mg | ORAL_TABLET | Freq: Once | ORAL | Status: AC

## 2018-08-06 NOTE — Progress Notes (Signed)
Occupational Therapy Treatment Patient Details Name: Cindy [name] MRN: [id number] DOB: 31-Dec-1922 Today's Date: 08/06/2018    History of present illness Pt admitted with left proximal humerus fx s/p mechanical fall at home. Pt now s/p reverse left shoulder arthroplasty on 4/25.   OT comments  Pt progressing towards OT goals this session. Improved bed mobility to min guard with increased time, able to walk from bed to recliner with min A hand held assist. Reviewed shoulder handout stressing sequencing for dressing, bathing underarm. Then performed all of shoulder HEP as stated below. Pt able to perform hand, wrist, elbow exercises with AROM, required PROM for shoulder movement (limited by pain) educated on importance of ROM to prevent frozen shoulder. Pt shared that she really wants to be able to knit again "when this is all over" OT will continue to follow acutely.    Follow Up Recommendations  Home health OT;Supervision/Assistance - 24 hour;Follow surgeon's recommendation for DC plan and follow-up therapies    Equipment Recommendations  None recommended by OT    Recommendations for Other Services      Precautions / Restrictions Precautions Precautions: Shoulder;Fall Type of Shoulder Precautions: active protocol Shoulder Interventions: Shoulder sling/immobilizer;For comfort(for sleep) Precaution Booklet Issued: Yes (comment) Precaution Comments: AROM/PROM shoulder flexion 90, abduction 60, external rotation 30. Required Braces or Orthoses: Sling Restrictions Weight Bearing Restrictions: Yes LUE Weight Bearing: Non weight bearing       Mobility Bed Mobility Overal bed mobility: Needs Assistance Bed Mobility: Supine to Sit     Supine to sit: Min guard;HOB elevated     General bed mobility comments: increased time and effort  Transfers Overall transfer level: Needs assistance   Transfers: Sit to/from Stand;Stand Pivot Transfers Sit to Stand: Min assist Stand pivot  transfers: Min assist       General transfer comment: min A for balanace via HHA    Balance Overall balance assessment: Needs assistance Sitting-balance support: Feet supported Sitting balance-Leahy Scale: Good     Standing balance support: Single extremity supported;During functional activity Standing balance-Leahy Scale: Poor Standing balance comment: reliant on single UE support                           ADL either performed or assessed with clinical judgement   ADL Overall ADL's : Needs assistance/impaired         Upper Body Bathing: Moderate assistance;Sitting Upper Body Bathing Details (indicate cue type and reason): educated on dangle to get under arm pit     Upper Body Dressing : Maximal assistance;Sitting Upper Body Dressing Details (indicate cue type and reason): educated to dress operated arm first Lower Body Dressing: Moderate assistance;Sit to/from stand   Toilet Transfer: Minimal assistance;Ambulation(HHA) Toilet Transfer Details (indicate cue type and reason): single hand held assist  Toileting- Clothing Manipulation and Hygiene: Min guard;Sit to/from stand       Functional mobility during ADLs: Minimal assistance(hand held assist) General ADL Comments: reinforced shoulder handout     Vision       Perception     Praxis      Cognition Arousal/Alertness: Awake/alert Behavior During Therapy: WFL for tasks assessed/performed Overall Cognitive Status: Within Functional Limits for tasks assessed                                          Exercises Exercises: Shoulder;General Upper Extremity  Shoulder Exercises Shoulder Flexion: PROM;Left;10 reps;Seated(only able to make it to 30 this session - unable AROM) Shoulder ABduction: PROM;Left;10 reps;Seated(only able to make it to 30 this session - unable AROM) Shoulder External Rotation: PROM;Left;10 reps;Seated(only able to make it to 30 from stomach PROM only) Elbow Flexion:  AROM;Left;10 reps;Seated Wrist Flexion: AROM;Left;10 reps;Seated Wrist Extension: AROM;Left;10 reps;Seated Digit Composite Flexion: AROM;Left;10 reps;Seated Composite Extension: AROM;Left;10 reps;Seated Neck Flexion: AROM Neck Extension: AROM Neck Lateral Flexion - Right: AROM Neck Lateral Flexion - Left: AROM   Shoulder Instructions Shoulder Instructions Donning/doffing shirt without moving shoulder: Maximal assistance Method for sponge bathing under operated UE: Maximal assistance Donning/doffing sling/immobilizer: Maximal assistance Correct positioning of sling/immobilizer: Maximal assistance ROM for elbow, wrist and digits of operated UE: Minimal assistance Sling wearing schedule (on at all times/off for ADL's): Independent Proper positioning of operated UE when showering: Moderate assistance Positioning of UE while sleeping: Minimal assistance     General Comments      Pertinent Vitals/ Pain       Pain Assessment: 0-10 Pain Score: 6  Pain Location: R shoulder with PROM (external rotation worst) Pain Descriptors / Indicators: Sharp;Discomfort;Grimacing;Sore Pain Intervention(s): Monitored during session;Premedicated before session;Repositioned;Ice applied  Home Living                                          Prior Functioning/Environment              Frequency  Min 3X/week        Progress Toward Goals  OT Goals(current goals can now be found in the care plan section)  Progress towards OT goals: Progressing toward goals  Acute Rehab OT Goals Patient Stated Goal: to go home OT Goal Formulation: With patient Time For Goal Achievement: 08/19/18 Potential to Achieve Goals: Good  Plan Discharge plan remains appropriate;Frequency remains appropriate    Co-evaluation                 AM-PAC OT "6 Clicks" Daily Activity     Outcome Measure   Help from another person eating meals?: A Little Help from another person taking care of  personal grooming?: A Little Help from another person toileting, which includes using toliet, bedpan, or urinal?: A Little Help from another person bathing (including washing, rinsing, drying)?: A Lot Help from another person to put on and taking off regular upper body clothing?: A Lot Help from another person to put on and taking off regular lower body clothing?: A Lot 6 Click Score: 15    End of Session Equipment Utilized During Treatment: (left shoulder sling)  OT Visit Diagnosis: Unsteadiness on feet (R26.81);History of falling (Z91.81);Pain Pain - Right/Left: Left Pain - part of body: Shoulder   Activity Tolerance Patient tolerated treatment well   Patient Left in chair;with call bell/phone within reach;with chair alarm set   Nurse Communication Mobility status;Patient requests pain meds;Other (comment)(No O2 read with therapist pulse ox)        Time: 1039-1101 OT Time Calculation (min): 22 min  Charges: OT General Charges $OT Visit: 1 Visit OT Treatments $Therapeutic Exercise: 8-22 mins  Hulda Humphrey OTR/L Acute Rehabilitation Services Pager: [phone] Office: [place] 08/06/2018, 12:46 PM

## 2018-08-06 NOTE — Progress Notes (Signed)
Physical Therapy Treatment Patient Details Name: Cindy [name] MRN: [id number] DOB: 1922/10/09 Today's Date: 08/06/2018    History of Present Illness Pt admitted with left proximal humerus fx s/p mechanical fall at home. Pt now s/p reverse left shoulder arthroplasty on 4/25.    PT Comments    Continuing work on functional mobility and activity tolerance;  Limited today by orthostatic hypotension, with precipitous drop in BP form standing immediately to standing 3 minutes    08/06/18 1439 08/06/18 1448 08/06/18 1449  Vital Signs  Patient Position (if appropriate) Orthostatic Vitals  --   --   Orthostatic Lying   BP- Lying  --  113/54 (trendelenburg) 106/48  Pulse- Lying  --  50 66  Orthostatic Sitting  BP- Sitting 108/44  --   --   Pulse- Sitting 67  --   --   Orthostatic Standing at 0 minutes  BP- Standing at 0 minutes 95/43  --   --   Pulse- Standing at 0 minutes 70  --   --   Orthostatic Standing at 3 minutes  BP- Standing at 3 minutes (!) 52/44  --   --   Pulse- Standing at 3 minutes 76  --   --    I still favor her going home with assistance, but will need to get Orthostatic Hypotension under control   Follow Up Recommendations  Home health PT;Supervision/Assistance - 24 hour     Equipment Recommendations  Cane    Recommendations for Other Services       Precautions / Restrictions Precautions Precautions: Shoulder;Fall;Other (comment)(Orthostatic hypotension) Type of Shoulder Precautions: active protocol Shoulder Interventions: Shoulder sling/immobilizer;For comfort(for sleep) Precaution Booklet Issued: Yes (comment) Precaution Comments: AROM/PROM shoulder flexion 90, abduction 60, external rotation 30. Required Braces or Orthoses: Sling Restrictions LUE Weight Bearing: Non weight bearing    Mobility  Bed Mobility Overal bed mobility: Needs Assistance Bed Mobility: Supine to Sit;Sit to Supine     Supine to sit: Min guard;HOB elevated Sit to supine: Mod  assist   General bed mobility comments: increased time and effort to get up; Mod assist to get her feet in the bed quickly to lay down after BP drop in standing  Transfers Overall transfer level: Needs assistance Equipment used: 1 person hand held assist Transfers: Sit to/from Stand Sit to Stand: Min assist         General transfer comment: min A for balanace via HHA  Ambulation/Gait             General Gait Details: Did not walk today due to dizziness in standing and BP drop   Stairs             Wheelchair Mobility    Modified Rankin (Stroke Patients Only)       Balance     Sitting balance-Leahy Scale: Good       Standing balance-Leahy Scale: Poor                              Cognition Arousal/Alertness: Awake/alert Behavior During Therapy: WFL for tasks assessed/performed Overall Cognitive Status: Within Functional Limits for tasks assessed                                        Exercises      General Comments        Pertinent Vitals/Pain  Pain Assessment: Faces Faces Pain Scale: Hurts even more Pain Location: R shoulder Pain Descriptors / Indicators: Grimacing Pain Intervention(s): Monitored during session;Repositioned    Home Living                      Prior Function            PT Goals (current goals can now be found in the care plan section) Acute Rehab PT Goals Patient Stated Goal: to go home PT Goal Formulation: With patient Time For Goal Achievement: 08/19/18 Potential to Achieve Goals: Good Progress towards PT goals: Not progressing toward goals - comment(Limited by orthostatic hypotension)    Frequency    Min 5X/week      PT Plan Current plan remains appropriate;Other (comment)(Provided Orthostatic Hypotenstion resolves)    Co-evaluation              AM-PAC PT "6 Clicks" Mobility   Outcome Measure  Help needed turning from your back to your side while in a flat bed  without using bedrails?: A Little Help needed moving from lying on your back to sitting on the side of a flat bed without using bedrails?: A Little Help needed moving to and from a bed to a chair (including a wheelchair)?: A Little Help needed standing up from a chair using your arms (e.g., wheelchair or bedside chair)?: A Little Help needed to walk in hospital room?: A Lot Help needed climbing 3-5 steps with a railing? : Total 6 Click Score: 15    End of Session         PT Visit Diagnosis: Other abnormalities of gait and mobility (R26.89);Unsteadiness on feet (R26.81);Difficulty in walking, not elsewhere classified (R26.2)     Time: 7322-0254 PT Time Calculation (min) (ACUTE ONLY): 30 min  Charges:  $Therapeutic Activity: 23-37 mins                     Roney Marion, PT  Acute Rehabilitation Services Pager [phone] Office [place] 08/06/2018, 4:04 PM

## 2018-08-06 NOTE — Progress Notes (Signed)
Orthopedics Progress Note  Subjective: Patient still with no pain in the shoulder.  She also denies any foot or toe pain today  Objective:  Vitals:   08/06/18 0353 08/06/18 0525  BP: (!) 100/50 (!) 111/42  Pulse: (!) 41 (!) 49  Resp: 16 16  Temp: 97.9 F (36.6 C)   SpO2: 100% 97%    General: Awake and alert  Musculoskeletal: Left shoulder wound looks great. Dressing changed to Aquacel. No pain with PROM of the left shoulder. Both feet checked and there is no deformity and no swelling or erythema. No tenderness, sensation intact distally Neurovascularly intact  Lab Results  Component Value Date   WBC 9.3 08/04/2018   HGB 8.3 (L) 08/05/2018   HCT 25.6 (L) 08/05/2018   MCV 101.0 (H) 08/04/2018   PLT 239 08/04/2018       Component Value Date/Time   NA 138 08/05/2018 0244   NA 143 11/04/2014 1431   K 4.2 08/05/2018 0244   K 4.3 11/04/2014 1431   CL 110 08/05/2018 0244   CO2 19 (L) 08/05/2018 0244   CO2 25 11/04/2014 1431   GLUCOSE 97 08/05/2018 0244   GLUCOSE 99 11/04/2014 1431   BUN 38 (H) 08/05/2018 0244   BUN 32.6 (H) 11/04/2014 1431   CREATININE 1.81 (H) 08/05/2018 0244   CREATININE 1.4 (H) 11/04/2014 1431   CALCIUM 8.0 (L) 08/05/2018 0244   CALCIUM 10.0 11/04/2014 1431   GFRNONAA 24 (L) 08/05/2018 0244   GFRAA 27 (L) 08/05/2018 0244    Lab Results  Component Value Date   INR 1.03 06/25/2010   INR 0.98 03/17/2009    Assessment/Plan: POD #1s/p Procedure(s): REVERSE LEFT SHOULDER ARTHROPLASTY Patient doing well today.  Will need Home Health PT and OT.   Continue therapy while in-house. I have reached out to her family and her care giver to discuss discharge.  Doran Heater. Veverly Fells, MD 08/06/2018 9:15 AM

## 2018-08-06 NOTE — Progress Notes (Signed)
Informed Dr. Veverly Fells pf Hgb 7.8 orders for blood transfusion. Blood Consent signed. Pt A &0 x4. Will continue to monitor pt.

## 2018-08-06 NOTE — Progress Notes (Signed)
Pt's heart rate this am is 41 on the dinamap;49 apically. Bp 111/42, O2 sat 97% on 2liters O2 [place]. Pt alert and oriented x4. Her only complaint is back pain. I repositioned her on her side with pillows to help ease the back pain. Dr. Stann Mainland notified of patient's heart rate. Will continue to monitor.

## 2018-08-07 ENCOUNTER — Encounter (HOSPITAL_COMMUNITY): Payer: Self-pay | Admitting: Orthopedic Surgery

## 2018-08-07 LAB — HEMOGLOBIN AND HEMATOCRIT, BLOOD
HCT: 29.9 % — ABNORMAL LOW (ref 36.0–46.0)
Hemoglobin: 9.8 g/dL — ABNORMAL LOW (ref 12.0–15.0)

## 2018-08-07 LAB — TYPE AND SCREEN
ABO/RH(D): A POS
Antibody Screen: NEGATIVE
Unit division: 0

## 2018-08-07 LAB — BPAM RBC
Blood Product Expiration Date: 202005022359
ISSUE DATE / TIME: 202004262346
Unit Type and Rh: 600

## 2018-08-07 LAB — PREPARE RBC (CROSSMATCH)

## 2018-08-07 MED ORDER — ACETAMINOPHEN 325 MG PO TABS: 325.0000 mg | ORAL_TABLET | Freq: Four times a day (QID) | ORAL | Status: DC | PRN

## 2018-08-07 MED ORDER — HYDROCODONE-ACETAMINOPHEN 5-325 MG PO TABS: 1.0000 | ORAL_TABLET | Freq: Four times a day (QID) | ORAL | 0 refills | Status: DC | PRN

## 2018-08-07 NOTE — Plan of Care (Signed)

## 2018-08-07 NOTE — TOC Transition Note (Signed)
Transition of Care [facility]) - CM/SW Discharge Note   Patient Details  Name: Janilah Hojnacki MRN: [id number] Date of Birth: 1922/11/01  Transition of Care [facility]) CM/SW Contact:  Marilu Favre, RN Phone Number: 08/07/2018, 10:18 AM   Clinical Narrative:     Spoke to care giver Maurice March , he can provide 24 hour care. Requesting [facility] HHRN,PT,OT same ordered. Patient already has walker and cane at home. [name] will transport patient home when patient is ready for discharge today. Nurse Murray Hodgkins will call [name] when patient ready   Final next level of care: [place] Barriers to Discharge: No Barriers Identified   Patient Goals and CMS Choice Patient states their goals for this hospitalization and ongoing recovery are:: to go home  CMS Medicare.gov Compare Post Acute Care list provided to:: Other (Comment Required)(Roger Jimmye Norman ) Choice offered to / list presented to : Patient(Roger Jimmye Norman )  Discharge Placement                       Discharge Plan and Services   Discharge Planning Services: CM Consult            DME Arranged: N/A DME Agency: NA       HH Arranged: RN, OT, PT [place] Agency: [facility] [place] Date [facility] Agency Contacted: 08/07/18 Time [place]: 53 Representative spoke with at [place]: [place] (SDOH) Interventions     Readmission Risk Interventions No flowsheet data found.

## 2018-08-07 NOTE — Progress Notes (Signed)
Occupational Therapy Treatment Patient Details Name: Cindy [name] MRN: [id number] DOB: [birth year] Today's Date: 08/07/2018    History of present illness Pt admitted with left proximal humerus fx s/p mechanical fall at home. Pt now s/p reverse left shoulder arthroplasty on 4/25.   OT comments  Focus of today's session on ROM HEP.  Pt able to complete hand,wrist,and elbow exercises in sitting with supervision and using handout.  Completing shoulder flexion and abduction to ~30 degrees with PROM and external rotation to ~20 degrees with AAROM.  Therapist educated pt on taking handouts home for both herself and for the aide who will be assisting her.  She is able to recall that her elbow should be positioned in corner of sling but requires max assist to don sling.  Anticipate discharge home later today. Continue to recommend [place] and 24/7 assist from aide. Will continue to follow acutely.   Follow Up Recommendations  Home health OT;Supervision/Assistance - 24 hour;Follow surgeon's recommendation for DC plan and follow-up therapies    Equipment Recommendations  None recommended by OT    Recommendations for Other Services      Precautions / Restrictions Precautions Precautions: Shoulder;Fall Type of Shoulder Precautions: active protocol Shoulder Interventions: Shoulder sling/immobilizer;For comfort Precaution Booklet Issued: Yes (comment) Precaution Comments: AROM/PROM shoulder flexion 90, abduction 60, external rotation 30. Required Braces or Orthoses: Sling Restrictions Weight Bearing Restrictions: Yes LUE Weight Bearing: Non weight bearing       Mobility Bed Mobility Overal bed mobility: (pt up in chair)                Transfers                      Balance                                           ADL either performed or assessed with clinical judgement   ADL Overall ADL's : Needs assistance/impaired                 Upper Body  Dressing : Maximal assistance;Sitting                           Vision       Perception     Praxis      Cognition Arousal/Alertness: Awake/alert Behavior During Therapy: WFL for tasks assessed/performed Overall Cognitive Status: Within Functional Limits for tasks assessed                                          Exercises Shoulder Exercises Shoulder Flexion: PROM;Left;10 reps;Seated(to ~30 degrees) Shoulder ABduction: PROM;Left;10 reps;Seated(to ~30 degrees) Shoulder External Rotation: AAROM;Left;10 reps;Seated(to ~20 degrees) Elbow Flexion: AROM;Left;10 reps;Seated Wrist Flexion: AROM;Left;10 reps;Seated Wrist Extension: AROM;Left;10 reps;Seated Digit Composite Flexion: AROM;Left;10 reps;Seated Composite Extension: AROM;Left;10 reps;Seated   Shoulder Instructions Shoulder Instructions Donning/doffing sling/immobilizer: Maximal assistance Correct positioning of sling/immobilizer: Maximal assistance ROM for elbow, wrist and digits of operated UE: Supervision/safety Positioning of UE while sleeping: Minimal assistance     General Comments      Pertinent Vitals/ Pain       Pain Assessment: Faces Faces Pain Scale: Hurts even more Pain Location: L Shoulder Pain Descriptors / Indicators: Grimacing Pain Intervention(s): Limited activity  within patient's tolerance;Ice applied;Monitored during session  Home Living                                          Prior Functioning/Environment              Frequency  Min 3X/week        Progress Toward Goals  OT Goals(current goals can now be found in the care plan section)  Progress towards OT goals: Progressing toward goals  Acute Rehab OT Goals Patient Stated Goal: to go home OT Goal Formulation: With patient Time For Goal Achievement: 08/19/18 Potential to Achieve Goals: Good ADL Goals Pt Will Perform Upper Body Bathing: with min assist;sitting Pt Will Perform  Lower Body Bathing: with min guard assist;sit to/from stand Pt Will Perform Upper Body Dressing: with min assist;sitting Pt Will Perform Lower Body Dressing: with min assist Pt Will Transfer to Toilet: with supervision;ambulating;bedside commode Pt Will Perform Toileting - Clothing Manipulation and hygiene: with supervision;sit to/from stand Pt/caregiver will Perform Home Exercise Program: Left upper extremity;Increased ROM;With written HEP provided;With Supervision Additional ADL Goal #1: Pt will be independent with instructing caregiver on left shoulder sling donning/doffing process, wear schedule, and left UE positioning.  Plan Discharge plan remains appropriate    Co-evaluation                 AM-PAC OT "6 Clicks" Daily Activity     Outcome Measure   Help from another person eating meals?: A Little Help from another person taking care of personal grooming?: A Little Help from another person toileting, which includes using toliet, bedpan, or urinal?: A Little Help from another person bathing (including washing, rinsing, drying)?: A Lot Help from another person to put on and taking off regular upper body clothing?: A Lot Help from another person to put on and taking off regular lower body clothing?: A Lot 6 Click Score: 15    End of Session Equipment Utilized During Treatment: (sling)  OT Visit Diagnosis: Unsteadiness on feet (R26.81);History of falling (Z91.81);Pain Pain - Right/Left: Left Pain - part of body: Shoulder   Activity Tolerance Patient tolerated treatment well   Patient Left in chair;with call bell/phone within reach;with chair alarm set   Nurse Communication Mobility status        Time: 1015-1030 OT Time Calculation (min): 15 min  Charges: OT General Charges $OT Visit: 1 Visit OT Treatments $Therapeutic Exercise: 8-22 mins      Darrol Jump OTR/L [place]' [phone] 08/07/2018, 10:40 AM

## 2018-08-07 NOTE — Progress Notes (Addendum)
Physical Therapy Treatment Patient Details Name: Cindy [name] MRN: [id number] DOB: [birth year] Today's Date: 08/07/2018    History of Present Illness Pt admitted with left proximal humerus fx s/p mechanical fall at home. Pt now s/p reverse left shoulder arthroplasty on 4/25.    PT Comments    Pt with improved ambulation distance this session, taking one rest break for toileting. Pt complaining of fatigue after 35 ft ambulation in room. Per previous PT note and per RN request, PT paid close attention to orthostatic vitals. Pt with large drop in systolic and diastolic BP over the course of sitting, standing, and standing for 3 minutes (see below). Pt asymptomatic for dizziness this session, but given pt was brought in for fall and pt reports not knowing why she fell, PT cannot rule out orthostatic involvement. PT continuing to recommend 24/7 supervision when she returns home to ALF, per pt her aide and daughter are very helpful to her. Pt improving with mobility, pt planning to d/c today.   BP and HR sitting: 145/69, 64 bpm BP and HR standing 0 minutes: 126/70, 72 bpm BP and HR standing 3 minutes: 90/51, 64 bpm    Follow Up Recommendations  Home health PT;Supervision/Assistance - 24 hour     Equipment Recommendations  Cane    Recommendations for Other Services       Precautions / Restrictions Precautions Precautions: Shoulder;Fall Type of Shoulder Precautions: active protocol Shoulder Interventions: Shoulder sling/immobilizer;For comfort Precaution Booklet Issued: Yes (comment) Precaution Comments: AROM/PROM shoulder flexion 90, abduction 60, external rotation 30. Required Braces or Orthoses: Sling Restrictions Weight Bearing Restrictions: Yes LUE Weight Bearing: Non weight bearing    Mobility  Bed Mobility Overal bed mobility: Needs Assistance             General bed mobility comments: Pt up in chair upon PT arrival to room, and requesting returning to chair after PT  session.   Transfers Overall transfer level: Needs assistance Equipment used: 1 person hand held assist Transfers: Sit to/from Stand Sit to Stand: Min assist         General transfer comment: Min assist for power up, pt self-steadying upon standing.   Ambulation/Gait Ambulation/Gait assistance: Min guard Gait Distance (Feet): 35 [id number] ft) Assistive device: Straight cane Gait Pattern/deviations: Step-through pattern;Decreased stride length;Trunk flexed Gait velocity: decr    General Gait Details: Min guard for safety. Verbal cuing for upright posture, taking her time as pt tends to rush and be less steady. Pt ambulated 10 ft initially to toilet, then ambulated around room after this.    Stairs             Wheelchair Mobility    Modified Rankin (Stroke Patients Only)       Balance Overall balance assessment: Needs assistance Sitting-balance support: Feet supported Sitting balance-Leahy Scale: Good     Standing balance support: Single extremity supported;During functional activity Standing balance-Leahy Scale: Poor Standing balance comment: reliant on single UE support                            Cognition Arousal/Alertness: Awake/alert Behavior During Therapy: WFL for tasks assessed/performed Overall Cognitive Status: Within Functional Limits for tasks assessed                                        Exercises Shoulder Exercises Shoulder Flexion: PROM;Left;10 reps;Seated(to ~  30 degrees) Shoulder ABduction: PROM;Left;10 reps;Seated(to ~30 degrees) Shoulder External Rotation: AAROM;Left;10 reps;Seated(to ~20 degrees) Elbow Flexion: AROM;Left;10 reps;Seated Wrist Flexion: AROM;Left;10 reps;Seated Wrist Extension: AROM;Left;10 reps;Seated Digit Composite Flexion: AROM;Left;10 reps;Seated Composite Extension: AROM;Left;10 reps;Seated Donning/doffing sling/immobilizer: Maximal assistance Correct positioning of sling/immobilizer:  Maximal assistance ROM for elbow, wrist and digits of operated UE: Supervision/safety Positioning of UE while sleeping: Minimal assistance    General Comments General comments (skin integrity, edema, etc.): orthostatic vitals noted, see assessment. Pt with IV leaking during PT session, RN notified. Pt on 2LO2 upon PT arrival to room, but pt does not wear O2 at baseline and has not been having difficulty with sats per pt and RN. PT removed O2 for session and post-session, RN notified, no complaints of dypsnea.       Pertinent Vitals/Pain Pain Assessment: No/denies pain Faces Pain Scale: Hurts even more Pain Location: L Shoulder Pain Descriptors / Indicators: Grimacing Pain Intervention(s): Limited activity within patient's tolerance;Ice applied;Monitored during session    Home Living                      Prior Function            PT Goals (current goals can now be found in the care plan section) Acute Rehab PT Goals Patient Stated Goal: to go home PT Goal Formulation: With patient Time For Goal Achievement: 08/19/18 Potential to Achieve Goals: Good Progress towards PT goals: Progressing toward goals    Frequency    Min 5X/week      PT Plan Current plan remains appropriate    Co-evaluation              AM-PAC PT "6 Clicks" Mobility   Outcome Measure  Help needed turning from your back to your side while in a flat bed without using bedrails?: A Little Help needed moving from lying on your back to sitting on the side of a flat bed without using bedrails?: A Little Help needed moving to and from a bed to a chair (including a wheelchair)?: A Little Help needed standing up from a chair using your arms (e.g., wheelchair or bedside chair)?: A Little Help needed to walk in hospital room?: A Little Help needed climbing 3-5 steps with a railing? : A Lot 6 Click Score: 17    End of Session Equipment Utilized During Treatment: Gait belt;Other (comment)(shoulder  sling) Activity Tolerance: Patient limited by pain Patient left: in chair;with call bell/phone within reach;with chair alarm set Nurse Communication: Other (comment);Mobility status(IV leaking, orthostatics ) PT Visit Diagnosis: Other abnormalities of gait and mobility (R26.89);Unsteadiness on feet (R26.81);Difficulty in walking, not elsewhere classified (R26.2)     Time: 5956-3875 PT Time Calculation (min) (ACUTE ONLY): 30 min  Charges:  $Gait Training: 8-22 mins $Therapeutic Activity: 8-22 mins                     Julien Girt, PT Acute Rehabilitation Services Pager [phone]  Office [phone]   Saysha Menta D Purvi Ruehl 08/07/2018, 1:29 PM

## 2018-08-07 NOTE — Discharge Instructions (Signed)
Ice to the shoulder constantly.  Keep the incision covered and clean and dry for one week, then ok to get it wet in the shower.  You DO NOT need to change the current bandage until Wednesday, then just use some dry gauze and paper tape loosely each day until next Friday, then may leave uncovered.  Do exercise as instructed several times per day.  You are able to use the arm for gentle daily activity and you may use a walker.  Move the hand a lot to encourage circulation  DO NOT reach behind your back or push up out of a chair with the operative arm.  Use a sling while you are up and around for comfort, may remove while seated.  Keep pillow propped behind the operative elbow.  Follow up with Dr Veverly Fells in two weeks in the office, call [phone] for appt

## 2018-08-07 NOTE — Progress Notes (Signed)
Discharge information and instructions given to pt. Pt is not in distress and tolerated well.

## 2018-08-07 NOTE — Discharge Summary (Signed)
Orthopedic Discharge Summary        Physician Discharge Summary  Patient ID: Cindy [name] MRN: [id number] DOB/AGE: Jul 22, 1922 85 y.o.  Admit date: 08/04/2018 Discharge date: 08/07/2018   Procedures:  Procedure(s) (LRB): REVERSE LEFT SHOULDER ARTHROPLASTY (Left)  Attending Physician:  Dr. Esmond Plants  Admission Diagnoses:   Left shoulder displaced proximal humerus fracture, chronic renal insufficiency  Discharge Diagnoses:  Left shoulder displaced proximal humerus fracture, acute blood loss anemia, chronic renal insufficiency   Past Medical History:  Diagnosis Date  . Arthritis   . Chronic renal insufficiency, stage III (moderate) (HCC)   . Depression   . Dyslipidemia   . Forgetfulness   . GERD (gastroesophageal reflux disease)    takes Omeprazole for Hital Hernia   . Hiatal hernia   . History of nuclear stress test 2005   persantine; inferoapical ischmeia with EF 83%  . HOH (hard of hearing)    hearing aids  . Hypertension   . Hypothyroidism   . LBBB (left bundle branch block)   . LBBB (left bundle branch block)    chronic per Dr. Gwenlyn Found  . Lung nodule 07/01/03   right middle lobe  . Macular degeneration   . Malignant melanoma ([place]) 11/12/04   L upper arm, MOHS_ right hand, leg  . Murmur, cardiac   . Osteoporosis   . Pneumonia 06/2018    PCP: Lorne Skeens, MD   Discharged Condition: good  Hospital Course:  Patient underwent the above stated procedure on 08/04/2018. Patient tolerated the procedure well and brought to the recovery room in good condition and subsequently to the floor. Patient had an uncomplicated hospital course and was stable for discharge.   Disposition: Discharge disposition: 01-Home or Self Care      with follow up in 2 weeks   Follow-up Information    Netta Cedars, MD. Call in 2 weeks.   Specialty:  Orthopedic Surgery Why:  [phone] Contact information: [address] [place] [address]  [phone]           Discharge Instructions    Call MD / Call 911   Complete by:  As directed    If you experience chest pain or shortness of breath, CALL 911 and be transported to the hospital emergency room.  If you develope a fever above 101 F, pus (white drainage) or increased drainage or redness at the wound, or calf pain, call your surgeon's office.   Constipation Prevention   Complete by:  As directed    Drink plenty of fluids.  Prune juice may be helpful.  You may use a stool softener, such as Colace (over the counter) 100 mg twice a day.  Use MiraLax (over the counter) for constipation as needed.   Diet - low sodium heart healthy   Complete by:  As directed    Increase activity slowly as tolerated   Complete by:  As directed       Allergies as of 08/07/2018      Reactions   Fish Allergy Hives   Sulfa Antibiotics    Unknown reaction      Medication List    STOP taking these medications   albuterol (2.5 MG/3ML) 0.083% nebulizer solution Commonly known as:  PROVENTIL   benzonatate 200 MG capsule Commonly known as:  TESSALON   hydrALAZINE 10 MG tablet Commonly known as:  APRESOLINE     TAKE these medications   acetaminophen 325 MG tablet Commonly known as:  TYLENOL Take  1-2 tablets (325-650 mg total) by mouth every 6 (six) hours as needed for mild pain or moderate pain (pain score 1-3 or temp > 100.5).   aspirin 81 MG tablet Take 81 mg by mouth daily.   clobetasol 0.05 % external solution Commonly known as:  TEMOVATE Apply 1 application topically 2 (two) times a week.   CRANBERRY PO Take 1 tablet by mouth daily.   furosemide 20 MG tablet Commonly known as:  LASIX Take 20 mg by mouth daily.   HAIR SKIN AND NAILS FORMULA PO Take 1 tablet by mouth 3 (three) times daily.   HYDROcodone-acetaminophen 5-325 MG tablet Commonly known as:  NORCO/VICODIN Take 1 tablet by mouth every 6 (six) hours as needed for severe pain (pain). What changed:    how  much to take  when to take this  reasons to take this   hydrocortisone 2.5 % cream Apply 1 application topically 2 (two) times daily as needed (dry skin).   ipratropium 0.06 % nasal spray Commonly known as:  ATROVENT Place 1-2 sprays into both nostrils every 6 (six) hours as needed (rhinorrhea).   ketoconazole 2 % shampoo Commonly known as:  NIZORAL Apply 1 application topically 3 (three) times a week.   levothyroxine 75 MCG tablet Commonly known as:  SYNTHROID Take 1 tablet (75 mcg total) by mouth daily before breakfast.   loratadine 10 MG tablet Commonly known as:  CLARITIN Take 10 mg by mouth daily.   multivitamin capsule Take 1 capsule by mouth daily.   Myrbetriq 50 MG Tb24 tablet Generic drug:  mirabegron ER Take 50 mg by mouth daily.   olmesartan 40 MG tablet Commonly known as:  BENICAR Take 40 mg by mouth daily.   Omega-3 1000 MG Caps Take 1,000 mg by mouth 2 (two) times a day.   omeprazole 20 MG capsule Commonly known as:  PRILOSEC Take 20 mg by mouth daily.   PREVAGEN PO Take 1 tablet by mouth daily.   simvastatin 40 MG tablet Commonly known as:  ZOCOR Take 40 mg by mouth every evening.         Signed: Augustin Schooling 08/07/2018, 8:59 AM  [facility] is now [facility] [address] [place], [place],  [address] Phone: [place]

## 2018-08-07 NOTE — Progress Notes (Signed)
Orthopedics Progress Note  Subjective: Patient still feeling weak but min pain with the shoulder.   Objective:  Vitals:   08/07/18 0243 08/07/18 0608  BP:  (!) 148/47  Pulse: 63 (!) 44  Resp:    Temp:  97.9 F (36.6 C)  SpO2:      General: Awake and alert  Musculoskeletal: Left hand swollen with some bruising. Dressing looks good. Wiggles fingers well, sensation intact Neurovascularly intact  Lab Results  Component Value Date   WBC 9.3 08/04/2018   HGB 9.8 (L) 08/07/2018   HCT 29.9 (L) 08/07/2018   MCV 101.0 (H) 08/04/2018   PLT 239 08/04/2018       Component Value Date/Time   NA 138 08/05/2018 0244   NA 143 11/04/2014 1431   K 4.2 08/05/2018 0244   K 4.3 11/04/2014 1431   CL 110 08/05/2018 0244   CO2 19 (L) 08/05/2018 0244   CO2 25 11/04/2014 1431   GLUCOSE 97 08/05/2018 0244   GLUCOSE 99 11/04/2014 1431   BUN 38 (H) 08/05/2018 0244   BUN 32.6 (H) 11/04/2014 1431   CREATININE 1.81 (H) 08/05/2018 0244   CREATININE 1.4 (H) 11/04/2014 1431   CALCIUM 8.0 (L) 08/05/2018 0244   CALCIUM 10.0 11/04/2014 1431   GFRNONAA 24 (L) 08/05/2018 0244   GFRAA 27 (L) 08/05/2018 0244    Lab Results  Component Value Date   INR 1.03 06/25/2010   INR 0.98 03/17/2009    Assessment/Plan: POD #3 s/p Procedure(s): REVERSE LEFT SHOULDER ARTHROPLASTY Continue PT, OT D/C planning, will need home health PT, OT, Aide - Face to Face completed Follow up with Dr Veverly Fells in two weeks in the office  Patient has 24 hour care available at home [facility], [place]   Doran Heater. Veverly Fells, MD 08/07/2018 8:51 AM

## 2018-09-12 ENCOUNTER — Ambulatory Visit (HOSPITAL_COMMUNITY)
Admission: RE | Admit: 2018-09-12 | Discharge: 2018-09-12 | Disposition: A | Discharge status: Home / Self Care | Payer: Medicare Other | Source: Ambulatory Visit | Attending: Family | Admitting: Family

## 2018-09-12 ENCOUNTER — Other Ambulatory Visit (HOSPITAL_COMMUNITY): Payer: Self-pay | Admitting: Endocrinology

## 2018-09-12 ENCOUNTER — Ambulatory Visit: Payer: Medicare Other | Admitting: Podiatry

## 2018-09-12 ENCOUNTER — Other Ambulatory Visit: Payer: Self-pay

## 2018-09-12 DIAGNOSIS — M7989 Other specified soft tissue disorders: Secondary | ICD-10-CM

## 2018-10-03 ENCOUNTER — Other Ambulatory Visit: Payer: Self-pay

## 2018-10-03 ENCOUNTER — Encounter: Payer: Self-pay | Admitting: Podiatry

## 2018-10-03 ENCOUNTER — Ambulatory Visit (INDEPENDENT_AMBULATORY_CARE_PROVIDER_SITE_OTHER): Payer: Medicare Other | Admitting: Podiatry

## 2018-10-03 DIAGNOSIS — B351 Tinea unguium: Secondary | ICD-10-CM | POA: Diagnosis not present

## 2018-10-03 DIAGNOSIS — M79675 Pain in left toe(s): Secondary | ICD-10-CM

## 2018-10-03 DIAGNOSIS — M79674 Pain in right toe(s): Secondary | ICD-10-CM | POA: Diagnosis not present

## 2018-10-03 NOTE — Progress Notes (Signed)
Complaint:  Visit Type: Patient returns to my office for continued preventative foot care services. Complaint: Patient states" my nails have grown long and thick and become painful to walk and wear shoes"  She presents to the office with possibly a grandson. The patient presents for preventative foot care services. No changes to ROS  Podiatric Exam: Vascular: dorsalis pedis and posterior tibial pulses are palpable bilateral. Capillary return is immediate. Temperature gradient is WNL. Skin turgor WNL  Sensorium: Normal Semmes Weinstein monofilament test. Normal tactile sensation bilaterally. Nail Exam: Pt has thick disfigured discolored nails with subungual debris noted bilateral entire nail hallux through fifth toenails Ulcer Exam: There is no evidence of ulcer or pre-ulcerative changes or infection. Orthopedic Exam: Muscle tone and strength are WNL. No limitations in general ROM. No crepitus or effusions noted. Foot type and digits show no abnormalities. Contracted hallux left foot. Skin: No Porokeratosis. No infection or ulcers  Diagnosis:  Onychomycosis, , Pain in right toe, pain in left toes  Treatment & Plan Procedures and Treatment: Consent by patient was obtained for treatment procedures.   Debridement of mycotic and hypertrophic toenails, 1 through 5 bilateral and clearing of subungual debris. No ulceration, no infection noted.  Return Visit-Office Procedure: Patient instructed to return to the office for a follow up visit 3 months for continued evaluation and treatment.    Gardiner Barefoot DPM

## 2019-01-16 ENCOUNTER — Ambulatory Visit (INDEPENDENT_AMBULATORY_CARE_PROVIDER_SITE_OTHER): Payer: Medicare Other | Admitting: Cardiovascular Disease

## 2019-01-16 ENCOUNTER — Encounter: Payer: Self-pay | Admitting: Cardiovascular Disease

## 2019-01-16 ENCOUNTER — Other Ambulatory Visit: Payer: Self-pay

## 2019-01-16 DIAGNOSIS — I447 Left bundle-branch block, unspecified: Secondary | ICD-10-CM | POA: Diagnosis not present

## 2019-01-16 DIAGNOSIS — I10 Essential (primary) hypertension: Secondary | ICD-10-CM

## 2019-01-16 DIAGNOSIS — E782 Mixed hyperlipidemia: Secondary | ICD-10-CM

## 2019-01-16 NOTE — Assessment & Plan Note (Signed)
History of essential hypertension with blood pressure measured today at 110/67.  She is on olmesartan.

## 2019-01-16 NOTE — Patient Instructions (Signed)

## 2019-01-16 NOTE — Assessment & Plan Note (Signed)
History of hyperlipidemia on statin therapy followed by her PCP. 

## 2019-01-16 NOTE — Assessment & Plan Note (Signed)
Chronic. 

## 2019-01-16 NOTE — Progress Notes (Signed)
01/16/2019 Cindy [name]   09/02/1922  [id number]  Primary Physician Cindy [name], Cindy Como, MD Primary Cardiologist: Cindy Harp MD Cindy [name], [place]  HPI:  Cindy [name] is a 85 y.o.  thin and frail appearing married Caucasian female whose husband Cindy [name] a patient of mine.Unfortunately show passed away on 04-01-16 after they have been married for 68 years. She has 2 living children and 6 grandchildren.I last saw her in the office  01/11/2018...  She is accompanied by her caregiver Cindy [name] today.  She has a history of normal coronary arteries by cath in 2005 and again in 2007. She has chronic left bundle branch block, hypertension and hyperlipidemia. She also has GERD, followed by Dr. Carol [name] with an endoscopy in the past. Dr. Elyse [name] followed her lipid profile.She is fairly independent and is accompanied by her caregiver who is here for several hours in the morning. She does live alone and continues to drive and to her daily chores.  Since I saw her a year ago she is remained stable.  She denies chest pain or shortness of breath.  Cindy [name] does state that she seems to be slowing down slightly because of her age.    Current Meds  Medication Sig  . acetaminophen (TYLENOL) 325 MG tablet Take 1-2 tablets (325-650 mg total) by mouth every 6 (six) hours as needed for mild pain or moderate pain (pain score 1-3 or temp > 100.5).  Marland Kitchen Apoaequorin (PREVAGEN PO) Take 1 tablet by mouth daily.  Marland Kitchen aspirin 81 MG tablet Take 81 mg by mouth daily.  . Aspirin Buf,CaCarb-MgCarb-MgO, 81 MG TABS aspirin 81 mg tablet   81 mg by oral route.  . cephALEXin (KEFLEX) 500 MG capsule cephalexin 500 mg capsule  TK 1 C PO Q 6 H  . clobetasol (TEMOVATE) 0.05 % external solution Apply 1 application topically 2 (two) times a week.   Marland Kitchen CRANBERRY PO Take 1 tablet by mouth daily.   . furosemide (LASIX) 20 MG tablet furosemide 20 mg tablet   20 mg by oral route.  Marland Kitchen HYDROcodone-acetaminophen  (NORCO/VICODIN) 5-325 MG tablet Take 1 tablet by mouth every 6 (six) hours as needed for severe pain (pain).  . hydrocortisone 2.5 % cream Apply 1 application topically 2 (two) times daily as needed (dry skin).   Marland Kitchen ipratropium (ATROVENT) 0.06 % nasal spray Place 1-2 sprays into both nostrils every 6 (six) hours as needed (rhinorrhea).  Marland Kitchen ketoconazole (NIZORAL) 2 % shampoo Apply 1 application topically 3 (three) times a week.   . levothyroxine (SYNTHROID) 75 MCG tablet levothyroxine 75 mcg tablet   75 ugs by oral route.  . loratadine (CLARITIN) 10 MG tablet loratadine 10 mg tablet   10 mg by oral route.  . mirabegron ER (MYRBETRIQ) 50 MG TB24 tablet Take 1 tablet once a day  . Multiple Vitamin (MULTIVITAMIN) capsule Take 1 capsule by mouth daily.  . Multiple Vitamins-Minerals (HAIR SKIN AND NAILS FORMULA PO) Take 1 tablet by mouth 3 (three) times daily.  Marland Kitchen olmesartan (BENICAR) 40 MG tablet Take 40 mg by mouth daily.   . Omega-3 1000 MG CAPS Take 1,000 mg by mouth 2 (two) times a day.  Marland Kitchen omeprazole (PRILOSEC) 20 MG capsule Take 20 mg by mouth daily.  . Pseudoephedrine-APAP 30-325 MG TABS acetaminophen 325 mg tablet  . simvastatin (ZOCOR) 40 MG tablet Take 40 mg by mouth every evening.   . traMADol (ULTRAM) 50 MG tablet TK 1 T PO Q 8 H PRF  PAIN  . UNABLE TO FIND Take 1 tablet once a day  . UNABLE TO FIND omega-3 acid ethyl esters 1 gram capsule   1000 mg twice a day by oral route.  Marland Kitchen UNABLE TO FIND omeprazole 20 mg capsule,delayed release   20 mg by oral route.     Allergies  Allergen Reactions  . Fish Allergy Hives  . Diclofenac Other (See Comments)  . Sulfa Antibiotics     Unknown reaction    Social History   Socioeconomic History  . Marital status: Widowed    Spouse name: Not on file  . Number of children: Not on file  . Years of education: Not on file  . Highest education level: Not on file  Occupational History  . Not on file  Social Needs  . Financial resource strain:  Not on file  . Food insecurity    Worry: Not on file    Inability: Not on file  . Transportation needs    Medical: Not on file    Non-medical: Not on file  Tobacco Use  . Smoking status: Never Smoker  . Smokeless tobacco: Never Used  Substance and Sexual Activity  . Alcohol use: No  . Drug use: No  . Sexual activity: Not on file  Lifestyle  . Physical activity    Days per week: Not on file    Minutes per session: Not on file  . Stress: Not on file  Relationships  . Social Herbalist on phone: Not on file    Gets together: Not on file    Attends religious service: Not on file    Active member of club or organization: Not on file    Attends meetings of clubs or organizations: Not on file    Relationship status: Not on file  . Intimate partner violence    Fear of current or ex partner: Not on file    Emotionally abused: Not on file    Physically abused: Not on file    Forced sexual activity: Not on file  Other Topics Concern  . Not on file  Social History Narrative  . Not on file     Review of Systems: General: negative for chills, fever, night sweats or weight changes.  Cardiovascular: negative for chest pain, dyspnea on exertion, edema, orthopnea, palpitations, paroxysmal nocturnal dyspnea or shortness of breath Dermatological: negative for rash Respiratory: negative for cough or wheezing Urologic: negative for hematuria Abdominal: negative for nausea, vomiting, diarrhea, bright red blood per rectum, melena, or hematemesis Neurologic: negative for visual changes, syncope, or dizziness All other systems reviewed and are otherwise negative except as noted above.    Blood pressure 110/67, pulse 65, height 5' (1.524 m), weight 115 lb 9.6 oz (52.4 kg), SpO2 93 %.  General appearance: alert and no distress Neck: no adenopathy, no carotid bruit, no JVD, supple, symmetrical, trachea midline and thyroid not enlarged, symmetric, no tenderness/mass/nodules Lungs:  clear to auscultation bilaterally Heart: regular rate and rhythm, S1, S2 normal, no murmur, click, rub or gallop Extremities: extremities normal, atraumatic, no cyanosis or edema Pulses: 2+ and symmetric Skin: Skin color, texture, turgor normal. No rashes or lesions Neurologic: Alert and oriented X 3, normal strength and tone. Normal symmetric reflexes. Normal coordination and gait  EKG sinus rhythm at 65 with left bundle branch block.  I personally reviewed this EKG.  ASSESSMENT AND PLAN:   Left bundle branch block Chronic  Essential hypertension History of essential hypertension with  blood pressure measured today at 110/67.  She is on olmesartan.  Hyperlipidemia History of hyperlipidemia on statin therapy followed by her PCP      Cindy Harp MD [facility], [facility] 01/16/2019 11:46 AM

## 2019-02-02 ENCOUNTER — Ambulatory Visit: Payer: Medicare Other | Admitting: Podiatry

## 2019-02-18 ENCOUNTER — Emergency Department (HOSPITAL_COMMUNITY): Payer: Medicare Other

## 2019-02-18 ENCOUNTER — Emergency Department (HOSPITAL_COMMUNITY)
Admission: EM | Admit: 2019-02-18 | Discharge: 2019-02-18 | Disposition: A | Discharge status: Home / Self Care | Payer: Medicare Other | Attending: Emergency Medicine | Admitting: Emergency Medicine

## 2019-02-18 ENCOUNTER — Encounter (HOSPITAL_COMMUNITY): Payer: Self-pay

## 2019-02-18 ENCOUNTER — Other Ambulatory Visit: Payer: Self-pay

## 2019-02-18 DIAGNOSIS — K449 Diaphragmatic hernia without obstruction or gangrene: Secondary | ICD-10-CM | POA: Insufficient documentation

## 2019-02-18 DIAGNOSIS — N179 Acute kidney failure, unspecified: Secondary | ICD-10-CM | POA: Diagnosis not present

## 2019-02-18 DIAGNOSIS — N183 Chronic kidney disease, stage 3 unspecified: Secondary | ICD-10-CM | POA: Insufficient documentation

## 2019-02-18 DIAGNOSIS — Z7982 Long term (current) use of aspirin: Secondary | ICD-10-CM | POA: Diagnosis not present

## 2019-02-18 DIAGNOSIS — R110 Nausea: Secondary | ICD-10-CM | POA: Diagnosis not present

## 2019-02-18 DIAGNOSIS — I129 Hypertensive chronic kidney disease with stage 1 through stage 4 chronic kidney disease, or unspecified chronic kidney disease: Secondary | ICD-10-CM | POA: Insufficient documentation

## 2019-02-18 DIAGNOSIS — Z79899 Other long term (current) drug therapy: Secondary | ICD-10-CM | POA: Diagnosis not present

## 2019-02-18 DIAGNOSIS — R109 Unspecified abdominal pain: Secondary | ICD-10-CM

## 2019-02-18 DIAGNOSIS — Z20828 Contact with and (suspected) exposure to other viral communicable diseases: Secondary | ICD-10-CM | POA: Insufficient documentation

## 2019-02-18 LAB — I-STAT CHEM 8, ED
BUN: 54 mg/dL — ABNORMAL HIGH (ref 8–23)
Calcium, Ion: 1.12 mmol/L — ABNORMAL LOW (ref 1.15–1.40)
Chloride: 103 mmol/L (ref 98–111)
Creatinine, Ser: 2.6 mg/dL — ABNORMAL HIGH (ref 0.44–1.00)
Glucose, Bld: 90 mg/dL (ref 70–99)
HCT: 42 % (ref 36.0–46.0)
Hemoglobin: 14.3 g/dL (ref 12.0–15.0)
Potassium: 4.6 mmol/L (ref 3.5–5.1)
Sodium: 138 mmol/L (ref 135–145)
TCO2: 22 mmol/L (ref 22–32)

## 2019-02-18 LAB — CBC WITH DIFFERENTIAL/PLATELET
Abs Immature Granulocytes: 0.05 10*3/uL (ref 0.00–0.07)
Basophils Absolute: 0.1 10*3/uL (ref 0.0–0.1)
Basophils Relative: 1 %
Eosinophils Absolute: 0.7 10*3/uL — ABNORMAL HIGH (ref 0.0–0.5)
Eosinophils Relative: 6 %
HCT: 42.6 % (ref 36.0–46.0)
Hemoglobin: 13.6 g/dL (ref 12.0–15.0)
Immature Granulocytes: 0 %
Lymphocytes Relative: 7 %
Lymphs Abs: 0.9 10*3/uL (ref 0.7–4.0)
MCH: 31.6 pg (ref 26.0–34.0)
MCHC: 31.9 g/dL (ref 30.0–36.0)
MCV: 98.8 fL (ref 80.0–100.0)
Monocytes Absolute: 1 10*3/uL (ref 0.1–1.0)
Monocytes Relative: 8 %
Neutro Abs: 9.7 10*3/uL — ABNORMAL HIGH (ref 1.7–7.7)
Neutrophils Relative %: 78 %
Platelets: 330 10*3/uL (ref 150–400)
RBC: 4.31 MIL/uL (ref 3.87–5.11)
RDW: 12.8 % (ref 11.5–15.5)
WBC: 12.4 10*3/uL — ABNORMAL HIGH (ref 4.0–10.5)
nRBC: 0 % (ref 0.0–0.2)

## 2019-02-18 LAB — URINALYSIS, ROUTINE W REFLEX MICROSCOPIC
Bilirubin Urine: NEGATIVE
Glucose, UA: NEGATIVE mg/dL
Hgb urine dipstick: NEGATIVE
Ketones, ur: 5 mg/dL — AB
Leukocytes,Ua: NEGATIVE
Nitrite: NEGATIVE
Protein, ur: NEGATIVE mg/dL
Specific Gravity, Urine: 1.012 (ref 1.005–1.030)
pH: 5 (ref 5.0–8.0)

## 2019-02-18 LAB — COMPREHENSIVE METABOLIC PANEL
ALT: 11 U/L (ref 0–44)
AST: 17 U/L (ref 15–41)
Albumin: 3.3 g/dL — ABNORMAL LOW (ref 3.5–5.0)
Alkaline Phosphatase: 51 U/L (ref 38–126)
Anion gap: 14 (ref 5–15)
BUN: 63 mg/dL — ABNORMAL HIGH (ref 8–23)
CO2: 22 mmol/L (ref 22–32)
Calcium: 9.1 mg/dL (ref 8.9–10.3)
Chloride: 102 mmol/L (ref 98–111)
Creatinine, Ser: 2.62 mg/dL — ABNORMAL HIGH (ref 0.44–1.00)
GFR calc Af Amer: 17 mL/min — ABNORMAL LOW (ref >60)
GFR calc non Af Amer: 15 mL/min — ABNORMAL LOW (ref >60)
Glucose, Bld: 97 mg/dL (ref 70–99)
Potassium: 4.6 mmol/L (ref 3.5–5.1)
Sodium: 138 mmol/L (ref 135–145)
Total Bilirubin: 0.9 mg/dL (ref 0.3–1.2)
Total Protein: 7.4 g/dL (ref 6.5–8.1)

## 2019-02-18 LAB — BLOOD GAS, ARTERIAL
Acid-base deficit: 1.3 mmol/L (ref 0.0–2.0)
Bicarbonate: 21.3 mmol/L (ref 20.0–28.0)
Drawn by: 295031
FIO2: 21
O2 Saturation: 88.9 %
Patient temperature: 98.6
pCO2 arterial: 32.4 mmHg (ref 32.0–48.0)
pH, Arterial: 7.432 (ref 7.350–7.450)
pO2, Arterial: 58.4 mmHg — ABNORMAL LOW (ref 83.0–108.0)

## 2019-02-18 LAB — TROPONIN I (HIGH SENSITIVITY)
Troponin I (High Sensitivity): 35 ng/L — ABNORMAL HIGH (ref <18)
Troponin I (High Sensitivity): 35 ng/L — ABNORMAL HIGH (ref <18)

## 2019-02-18 LAB — LIPASE, BLOOD: Lipase: 27 U/L (ref 11–51)

## 2019-02-18 MED ORDER — FENTANYL CITRATE (PF) 100 MCG/2ML IJ SOLN
12.5000 ug | Freq: Once | INTRAMUSCULAR | Status: AC
  Administered 2019-02-18: 12.5 ug via INTRAVENOUS
  Filled 2019-02-18: qty 2

## 2019-02-18 MED ORDER — SODIUM CHLORIDE 0.9 % IV BOLUS
500.0000 mL | Freq: Once | INTRAVENOUS | Status: AC
  Administered 2019-02-18: 500 mL via INTRAVENOUS

## 2019-02-18 MED ORDER — FENTANYL CITRATE (PF) 100 MCG/2ML IJ SOLN: 25.0000 ug | Freq: Once | INTRAMUSCULAR | Status: DC

## 2019-02-18 NOTE — ED Triage Notes (Signed)
She is brought here by her grandson, who tells me they were about to go out to dinner. As they were proceeding, pt. Felt sudden right flank pain with "not feeling very well". They waited for about thirty minutes to see if it would abate, which the pain did; however, the feeling of general unease did not. He also states pt. Was recently treated by her pcp for uti. She is not currently on antibiotics. She is awake and alert and is oriented x 4.

## 2019-02-18 NOTE — Discharge Instructions (Signed)
Your work up didn't show anything serious. You have a hiatal hernia that is stable. You may have passed a kidney stone.   Your kidney function is slightly abnormal. Stay hydrated. Recheck kidney function in a week   See your doctor   Return to ER if you have worse abdominal pain, vomiting, fever, trouble breathing.

## 2019-02-18 NOTE — ED Provider Notes (Signed)
[facility] DEPT Provider Note   CSN: [id number] Arrival date & time: 02/18/19  1702     History   Chief Complaint Chief Complaint  Patient presents with  . Flank Pain    HPI Cindy [name] is a 85 y.o. female history of reflux, hypertension, melanoma here presenting with abdominal pain, flank pain.  Patient states that she was on her way to go to a restaurant and had acute onset of abdominal pain and bilateral flank pain.  Associated with some nausea as well.  She was recently treated for urinary tract infection but not currently on antibiotics.  Denies any dysuria or hematuria.      The history is provided by the patient.    Past Medical History:  Diagnosis Date  . Arthritis   . Chronic renal insufficiency, stage III (moderate)   . Depression   . Dyslipidemia   . Forgetfulness   . GERD (gastroesophageal reflux disease)    takes Omeprazole for Hital Hernia   . Hiatal hernia   . History of nuclear stress test 2005   persantine; inferoapical ischmeia with EF 83%  . HOH (hard of hearing)    hearing aids  . Hypertension   . Hypothyroidism   . LBBB (left bundle branch block)   . LBBB (left bundle branch block)    chronic per Dr. Gwenlyn Found  . Lung nodule 07/01/03   right middle lobe  . Macular degeneration   . Malignant melanoma ([place]) 11/12/04   L upper arm, MOHS_ right hand, leg  . Murmur, cardiac   . Osteoporosis   . Pneumonia 06/2018    Patient Active Problem List   Diagnosis Date Noted  . Pain due to onychomycosis of toenails of both feet 10/03/2018  . H/O total shoulder replacement, left 08/04/2018  . Pneumonia 06/13/2018  . Pressure injury of skin 06/13/2018  . Abdominal pain 11/08/2016  . Acute lower UTI 11/08/2016  . Acute cystitis without hematuria   . SBO (small bowel obstruction) ([place]) 06/28/2016  . Constipation, chronic 06/28/2016  . Acute distention of stomach 06/28/2016  . Eventration of left crus of diaphragm 06/27/2016   . Uncontrolled hypertension 06/27/2016  . Gastric outlet obstruction 06/27/2016  . CKD (chronic kidney disease) stage 3, GFR 30-59 ml/min (HCC) 06/14/2016  . Primary hypothyroidism 06/14/2016  . Osteoporosis, postmenopausal 06/14/2016  . OAB (overactive bladder) 02/25/2016  . Left bundle branch block 12/14/2012  . Essential hypertension 12/14/2012  . Hyperlipidemia 12/14/2012  . Pain in shoulder 11/11/2011  . Melanoma of left upper arm ([place]) 10/19/2011  . Lung mass 06/16/2011  . Acquired deformities of toe 06/14/2011  . Personal history of other diseases of the digestive system 06/07/2011  . GERD (gastroesophageal reflux disease) 06/07/2011    Past Surgical History:  Procedure Laterality Date  . BLADDER REPAIR    . CARDIAC CATHETERIZATION  2005/2007   normal coronaries   . CATARACT EXTRACTION     bilat  . coccyx  1951   excision  . COLONOSCOPY    . knee     right cartilage repair  . REVERSE SHOULDER ARTHROPLASTY Left 08/04/2018   Procedure: REVERSE LEFT SHOULDER ARTHROPLASTY;  Surgeon: Netta Cedars, MD;  Location: [facility];  Service: Orthopedics;  Laterality: Left;  . ROTATOR CUFF REPAIR     right  . TOTAL ABDOMINAL HYSTERECTOMY W/ BILATERAL SALPINGOOPHORECTOMY    . TRANSTHORACIC ECHOCARDIOGRAM  2007   EF=>55%; borderline conc LVH; LA mod dilated; RA mild-mod dilated; mild TR; mild  AVR with mildly sclerotic AV leavlefts; mild PV regurg      OB History   No obstetric history on file.      Home Medications    Prior to Admission medications   Medication Sig Start Date End Date Taking? Authorizing Provider  acetaminophen (TYLENOL) 325 MG tablet Take 1-2 tablets (325-650 mg total) by mouth every 6 (six) hours as needed for mild pain or moderate pain (pain score 1-3 or temp > 100.5). 08/07/18   Netta Cedars, MD  Apoaequorin (PREVAGEN PO) Take 1 tablet by mouth daily.    [provider]  aspirin 81 MG tablet Take 81 mg by mouth daily.    [provider]   Aspirin Buf,CaCarb-MgCarb-MgO, 81 MG TABS aspirin 81 mg tablet   81 mg by oral route.    [provider]  cephALEXin (KEFLEX) 500 MG capsule cephalexin 500 mg capsule  TK 1 C PO Q 6 H    [provider]  clobetasol (TEMOVATE) 0.05 % external solution Apply 1 application topically 2 (two) times a week.  04/21/18   [provider]  CRANBERRY PO Take 1 tablet by mouth daily.     [provider]  furosemide (LASIX) 20 MG tablet furosemide 20 mg tablet   20 mg by oral route.    [provider]  HYDROcodone-acetaminophen (NORCO/VICODIN) 5-325 MG tablet Take 1 tablet by mouth every 6 (six) hours as needed for severe pain (pain). 08/07/18   Netta Cedars, MD  hydrocortisone 2.5 % cream Apply 1 application topically 2 (two) times daily as needed (dry skin).  02/23/18   [provider]  ipratropium (ATROVENT) 0.06 % nasal spray Place 1-2 sprays into both nostrils every 6 (six) hours as needed (rhinorrhea).    [provider]  ketoconazole (NIZORAL) 2 % shampoo Apply 1 application topically 3 (three) times a week.  03/29/18   [provider]  levothyroxine (SYNTHROID) 75 MCG tablet levothyroxine 75 mcg tablet   75 ugs by oral route. 07/01/16   [provider]  loratadine (CLARITIN) 10 MG tablet loratadine 10 mg tablet   10 mg by oral route.    [provider]  mirabegron ER (MYRBETRIQ) 50 MG TB24 tablet Take 1 tablet once a day 03/01/17   [provider]  Multiple Vitamin (MULTIVITAMIN) capsule Take 1 capsule by mouth daily.    [provider]  Multiple Vitamins-Minerals (HAIR SKIN AND NAILS FORMULA PO) Take 1 tablet by mouth 3 (three) times daily.    [provider]  olmesartan (BENICAR) 40 MG tablet Take 40 mg by mouth daily.  12/26/17   [provider]  Omega-3 1000 MG CAPS Take 1,000 mg by mouth 2 (two) times a day.    [provider]  omeprazole (PRILOSEC) 20 MG capsule  Take 20 mg by mouth daily.    [provider]  Pseudoephedrine-APAP 30-325 MG TABS acetaminophen 325 mg tablet 08/07/18   [provider]  simvastatin (ZOCOR) 40 MG tablet Take 40 mg by mouth every evening.     [provider]  traMADol (ULTRAM) 50 MG tablet TK 1 T PO Q 8 H PRF PAIN 09/12/18   [provider]  UNABLE TO FIND Take 1 tablet once a day    [provider]  UNABLE TO FIND omega-3 acid ethyl esters 1 gram capsule   1000 mg twice a day by oral route.    [provider]  UNABLE TO FIND omeprazole 20  mg capsule,delayed release   20 mg by oral route.    [provider]    Family History Family History  Problem Relation Age of Onset  . Cancer Father        NHL  . Cancer Maternal Grandmother        rectal  . Cancer Daughter        breast  . Cancer Maternal Aunt        breast  . Cancer Son        melanoma    Social History Social History   Tobacco Use  . Smoking status: Never Smoker  . Smokeless tobacco: Never Used  Substance Use Topics  . Alcohol use: No  . Drug use: No     Allergies   Fish allergy, Diclofenac, and Sulfa antibiotics   Review of Systems Review of Systems  Genitourinary: Positive for flank pain.  All other systems reviewed and are negative.    Physical Exam Updated Vital Signs BP (!) 152/65 (BP Location: Left Arm)   Pulse 64   Temp (!) 97.5 F (36.4 C) (Oral)   Resp (!) 36   SpO2 94%   Physical Exam Vitals signs and nursing note reviewed.  Constitutional:      Comments: Chronically ill, uncomfortable   HENT:     Head: Normocephalic.     Right Ear: Tympanic membrane normal.     Left Ear: Tympanic membrane normal.     Nose: Nose normal.     Mouth/Throat:     Mouth: Mucous membranes are moist.  Eyes:     Extraocular Movements: Extraocular movements intact.     Pupils: Pupils are equal, round, and reactive to light.  Neck:     Musculoskeletal: Normal range of motion.   Cardiovascular:     Rate and Rhythm: Normal rate and regular rhythm.     Pulses: Normal pulses.     Heart sounds: Normal heart sounds.  Pulmonary:     Effort: Pulmonary effort is normal.     Breath sounds: Normal breath sounds.  Abdominal:     General: Abdomen is flat.     Comments: Mild epigastric tenderness, mild bilateral CVAT   Musculoskeletal: Normal range of motion.  Skin:    General: Skin is warm.     Capillary Refill: Capillary refill takes less than 2 seconds.  Neurological:     General: No focal deficit present.     Mental Status: She is alert and oriented to person, place, and time.  Psychiatric:        Mood and Affect: Mood normal.        Behavior: Behavior normal.      ED Treatments / Results  Labs (all labs ordered are listed, but only abnormal results are displayed) Labs Reviewed  CBC WITH DIFFERENTIAL/PLATELET - Abnormal; Notable for the following components:      Result Value   WBC 12.4 (*)    Neutro Abs 9.7 (*)    Eosinophils Absolute 0.7 (*)    All other components within normal limits  COMPREHENSIVE METABOLIC PANEL - Abnormal; Notable for the following components:   BUN 63 (*)    Creatinine, Ser 2.62 (*)    Albumin 3.3 (*)    GFR calc non Af Amer 15 (*)    GFR calc Af Amer 17 (*)    All other components within normal limits  BLOOD GAS, ARTERIAL - Abnormal; Notable for the following components:   pO2, Arterial 58.4 (*)  All other components within normal limits  I-STAT CHEM 8, ED - Abnormal; Notable for the following components:   BUN 54 (*)    Creatinine, Ser 2.60 (*)    Calcium, Ion 1.12 (*)    All other components within normal limits  TROPONIN I (HIGH SENSITIVITY) - Abnormal; Notable for the following components:   Troponin I (High Sensitivity) 35 (*)    All other components within normal limits  SARS CORONAVIRUS 2 (TAT 6-24 HRS)  URINE CULTURE  LIPASE, BLOOD  URINALYSIS, ROUTINE W REFLEX MICROSCOPIC    EKG EKG Interpretation   Date/Time:  Sunday February 18 2019 17:23:55 EST Ventricular Rate:  63 PR Interval:    QRS Duration: 150 QT Interval:  507 QTC Calculation: 520 R Axis:   -39 Text Interpretation: Sinus or ectopic atrial rhythm Prolonged PR interval Left bundle branch block No significant change since last tracing Confirmed by Wandra Arthurs ([id number]) on 02/18/2019 5:45:41 PM   Radiology Dg Chest Port 1 View  Result Date: 02/18/2019 CLINICAL DATA:  Shortness of breath, right flank pain EXAM: PORTABLE CHEST 1 VIEW COMPARISON:  06/13/2018 FINDINGS: Large hiatal hernia obscuring the left lower hemithorax. Right lung is clear. No pleural effusion or pneumothorax. Heart is normal in size.  Thoracic aortic atherosclerosis. Left shoulder arthroplasty. Degenerative changes of the right shoulder with suspected chronic rotator cuff tear. IMPRESSION: No evidence of acute cardiopulmonary disease. Large hiatal hernia. Electronically Signed   By: Julian Hy M.D.   On: 02/18/2019 18:08    Procedures Procedures (including critical care time)  Medications Ordered in ED Medications  fentaNYL (SUBLIMAZE) injection 12.5 mcg (12.5 mcg Intravenous Given 02/18/19 1847)  sodium chloride 0.9 % bolus 500 mL (500 mLs Intravenous New Bag/Given 02/18/19 1847)     Initial Impression / Assessment and Plan / ED Course  I have reviewed the triage vital signs and the nursing notes.  Pertinent labs & imaging results that were available during my care of the patient were reviewed by me and considered in my medical decision making (see chart for details).       Stephane Junkins is a 85 y.o. female here with epigastric pain, back pain. Consider biliary colic vs renal colic vs pyelonephritis. Will get labs, UA, CT renal stone.   8:39 PM Labs showed mild AKI with Cr 2.6, was 2.1 previously. Trop 35 x 2. CT renal stone unremarkable. Cath UA showed no UTI. Don't know why she had left flank pain. She has hiatal hernia and that could cause  pain or maybe she passed a kidney stone. Pain free currently. Stable for discharge    Final Clinical Impressions(s) / ED Diagnoses   Final diagnoses:  None    ED Discharge Orders    None       Drenda Freeze, MD 02/18/19 2040

## 2019-02-19 LAB — SARS CORONAVIRUS 2 (TAT 6-24 HRS): SARS Coronavirus 2: NEGATIVE

## 2019-02-20 LAB — URINE CULTURE: Culture: NO GROWTH

## 2019-03-05 ENCOUNTER — Inpatient Hospital Stay (HOSPITAL_COMMUNITY)
Admission: EM | Admit: 2019-03-05 | Discharge: 2019-03-07 | DRG: 281 | Disposition: A | Discharge status: Home Health | Payer: Medicare Other | Attending: Internal Medicine | Admitting: Internal Medicine

## 2019-03-05 ENCOUNTER — Other Ambulatory Visit: Payer: Self-pay

## 2019-03-05 ENCOUNTER — Emergency Department (HOSPITAL_COMMUNITY): Payer: Medicare Other

## 2019-03-05 ENCOUNTER — Encounter (HOSPITAL_COMMUNITY): Payer: Self-pay

## 2019-03-05 DIAGNOSIS — I447 Left bundle-branch block, unspecified: Secondary | ICD-10-CM | POA: Diagnosis present

## 2019-03-05 DIAGNOSIS — I440 Atrioventricular block, first degree: Secondary | ICD-10-CM | POA: Diagnosis present

## 2019-03-05 DIAGNOSIS — I214 Non-ST elevation (NSTEMI) myocardial infarction: Secondary | ICD-10-CM | POA: Diagnosis not present

## 2019-03-05 DIAGNOSIS — Z79890 Hormone replacement therapy (postmenopausal): Secondary | ICD-10-CM | POA: Diagnosis not present

## 2019-03-05 DIAGNOSIS — M810 Age-related osteoporosis without current pathological fracture: Secondary | ICD-10-CM | POA: Diagnosis present

## 2019-03-05 DIAGNOSIS — E785 Hyperlipidemia, unspecified: Secondary | ICD-10-CM | POA: Diagnosis present

## 2019-03-05 DIAGNOSIS — Z7982 Long term (current) use of aspirin: Secondary | ICD-10-CM

## 2019-03-05 DIAGNOSIS — R911 Solitary pulmonary nodule: Secondary | ICD-10-CM | POA: Diagnosis present

## 2019-03-05 DIAGNOSIS — I361 Nonrheumatic tricuspid (valve) insufficiency: Secondary | ICD-10-CM | POA: Diagnosis not present

## 2019-03-05 DIAGNOSIS — E039 Hypothyroidism, unspecified: Secondary | ICD-10-CM | POA: Diagnosis present

## 2019-03-05 DIAGNOSIS — K219 Gastro-esophageal reflux disease without esophagitis: Secondary | ICD-10-CM | POA: Diagnosis present

## 2019-03-05 DIAGNOSIS — R4182 Altered mental status, unspecified: Secondary | ICD-10-CM | POA: Diagnosis present

## 2019-03-05 DIAGNOSIS — E876 Hypokalemia: Secondary | ICD-10-CM | POA: Diagnosis present

## 2019-03-05 DIAGNOSIS — R54 Age-related physical debility: Secondary | ICD-10-CM | POA: Diagnosis present

## 2019-03-05 DIAGNOSIS — I10 Essential (primary) hypertension: Secondary | ICD-10-CM | POA: Diagnosis present

## 2019-03-05 DIAGNOSIS — I129 Hypertensive chronic kidney disease with stage 1 through stage 4 chronic kidney disease, or unspecified chronic kidney disease: Secondary | ICD-10-CM | POA: Diagnosis present

## 2019-03-05 DIAGNOSIS — K5900 Constipation, unspecified: Secondary | ICD-10-CM | POA: Diagnosis present

## 2019-03-05 DIAGNOSIS — N179 Acute kidney failure, unspecified: Secondary | ICD-10-CM

## 2019-03-05 DIAGNOSIS — I351 Nonrheumatic aortic (valve) insufficiency: Secondary | ICD-10-CM | POA: Diagnosis not present

## 2019-03-05 DIAGNOSIS — Z66 Do not resuscitate: Secondary | ICD-10-CM | POA: Diagnosis present

## 2019-03-05 DIAGNOSIS — Z96612 Presence of left artificial shoulder joint: Secondary | ICD-10-CM | POA: Diagnosis present

## 2019-03-05 DIAGNOSIS — Z20828 Contact with and (suspected) exposure to other viral communicable diseases: Secondary | ICD-10-CM | POA: Diagnosis present

## 2019-03-05 DIAGNOSIS — N184 Chronic kidney disease, stage 4 (severe): Secondary | ICD-10-CM | POA: Diagnosis present

## 2019-03-05 DIAGNOSIS — E782 Mixed hyperlipidemia: Secondary | ICD-10-CM | POA: Diagnosis present

## 2019-03-05 HISTORY — DX: Non-ST elevation (NSTEMI) myocardial infarction: I21.4

## 2019-03-05 LAB — COMPREHENSIVE METABOLIC PANEL
ALT: 15 U/L (ref 0–44)
AST: 39 U/L (ref 15–41)
Albumin: 3.4 g/dL — ABNORMAL LOW (ref 3.5–5.0)
Alkaline Phosphatase: 45 U/L (ref 38–126)
Anion gap: 17 — ABNORMAL HIGH (ref 5–15)
BUN: 95 mg/dL — ABNORMAL HIGH (ref 8–23)
CO2: 18 mmol/L — ABNORMAL LOW (ref 22–32)
Calcium: 9.2 mg/dL (ref 8.9–10.3)
Chloride: 100 mmol/L (ref 98–111)
Creatinine, Ser: 2.68 mg/dL — ABNORMAL HIGH (ref 0.44–1.00)
GFR calc Af Amer: 17 mL/min — ABNORMAL LOW (ref >60)
GFR calc non Af Amer: 15 mL/min — ABNORMAL LOW (ref >60)
Glucose, Bld: 116 mg/dL — ABNORMAL HIGH (ref 70–99)
Potassium: 3.6 mmol/L (ref 3.5–5.1)
Sodium: 135 mmol/L (ref 135–145)
Total Bilirubin: 1.2 mg/dL (ref 0.3–1.2)
Total Protein: 7.3 g/dL (ref 6.5–8.1)

## 2019-03-05 LAB — LIPID PANEL
Cholesterol: 202 mg/dL — ABNORMAL HIGH (ref 0–200)
HDL: 56 mg/dL (ref >40)
LDL Cholesterol: 120 mg/dL — ABNORMAL HIGH (ref 0–99)
Total CHOL/HDL Ratio: 3.6 RATIO
Triglycerides: 130 mg/dL (ref <150)
VLDL: 26 mg/dL (ref 0–40)

## 2019-03-05 LAB — URINALYSIS, ROUTINE W REFLEX MICROSCOPIC
Bilirubin Urine: NEGATIVE
Glucose, UA: NEGATIVE mg/dL
Hgb urine dipstick: NEGATIVE
Ketones, ur: 5 mg/dL — AB
Leukocytes,Ua: NEGATIVE
Nitrite: NEGATIVE
Protein, ur: NEGATIVE mg/dL
Specific Gravity, Urine: 1.012 (ref 1.005–1.030)
pH: 5 (ref 5.0–8.0)

## 2019-03-05 LAB — CBC
HCT: 41.1 % (ref 36.0–46.0)
Hemoglobin: 13.5 g/dL (ref 12.0–15.0)
MCH: 31.7 pg (ref 26.0–34.0)
MCHC: 32.8 g/dL (ref 30.0–36.0)
MCV: 96.5 fL (ref 80.0–100.0)
Platelets: 421 10*3/uL — ABNORMAL HIGH (ref 150–400)
RBC: 4.26 MIL/uL (ref 3.87–5.11)
RDW: 13.4 % (ref 11.5–15.5)
WBC: 7.4 10*3/uL (ref 4.0–10.5)
nRBC: 0 % (ref 0.0–0.2)

## 2019-03-05 LAB — TROPONIN I (HIGH SENSITIVITY)
Troponin I (High Sensitivity): 439 ng/L (ref <18)
Troponin I (High Sensitivity): 352 ng/L (ref <18)
Troponin I (High Sensitivity): 413 ng/L (ref <18)
Troponin I (High Sensitivity): 396 ng/L (ref <18)

## 2019-03-05 LAB — POC OCCULT BLOOD, ED: Fecal Occult Bld: POSITIVE — AB

## 2019-03-05 LAB — SARS CORONAVIRUS 2 (TAT 6-24 HRS): SARS Coronavirus 2: NEGATIVE

## 2019-03-05 LAB — LIPASE, BLOOD: Lipase: 51 U/L (ref 11–51)

## 2019-03-05 MED ORDER — ENOXAPARIN SODIUM 30 MG/0.3ML ~~LOC~~ SOLN
30.0000 mg | Freq: Every day | SUBCUTANEOUS | Status: DC
  Administered 2019-03-05 – 2019-03-06 (×2): 30 mg via SUBCUTANEOUS
  Filled 2019-03-05 (×2): qty 0.3

## 2019-03-05 MED ORDER — OMEGA-3-ACID ETHYL ESTERS 1 G PO CAPS: 1000.0000 mg | ORAL_CAPSULE | Freq: Every day | ORAL | Status: DC

## 2019-03-05 MED ORDER — ASPIRIN EC 81 MG PO TBEC
81.0000 mg | DELAYED_RELEASE_TABLET | Freq: Every day | ORAL | Status: DC
  Administered 2019-03-05 – 2019-03-07 (×3): 81 mg via ORAL
  Filled 2019-03-05 (×3): qty 1

## 2019-03-05 MED ORDER — IRBESARTAN 300 MG PO TABS
300.0000 mg | ORAL_TABLET | Freq: Every day | ORAL | Status: DC
  Administered 2019-03-06: 300 mg via ORAL
  Filled 2019-03-05: qty 1

## 2019-03-05 MED ORDER — MIRABEGRON ER 25 MG PO TB24
50.0000 mg | ORAL_TABLET | Freq: Every day | ORAL | Status: DC
  Administered 2019-03-05 – 2019-03-07 (×3): 50 mg via ORAL
  Filled 2019-03-05 (×3): qty 2

## 2019-03-05 MED ORDER — PROMETHAZINE HCL 25 MG/ML IJ SOLN: 6.2500 mg | Freq: Four times a day (QID) | INTRAMUSCULAR | Status: DC | PRN

## 2019-03-05 MED ORDER — KETOCONAZOLE 2 % EX SHAM
1.0000 "application " | MEDICATED_SHAMPOO | CUTANEOUS | Status: DC
  Administered 2019-03-07: 1 via TOPICAL

## 2019-03-05 MED ORDER — LEVOTHYROXINE SODIUM 75 MCG PO TABS
75.0000 ug | ORAL_TABLET | Freq: Every day | ORAL | Status: DC
  Administered 2019-03-07: 75 ug via ORAL
  Filled 2019-03-05: qty 1

## 2019-03-05 MED ORDER — LORATADINE 10 MG PO TABS
10.0000 mg | ORAL_TABLET | Freq: Every day | ORAL | Status: DC
  Administered 2019-03-05 – 2019-03-07 (×3): 10 mg via ORAL
  Filled 2019-03-05 (×3): qty 1

## 2019-03-05 MED ORDER — CLOBETASOL PROPIONATE 0.05 % EX CREA: 1.0000 "application " | TOPICAL_CREAM | CUTANEOUS | Status: DC

## 2019-03-05 MED ORDER — ACETAMINOPHEN 325 MG PO TABS: 650.0000 mg | ORAL_TABLET | ORAL | Status: DC | PRN

## 2019-03-05 MED ORDER — TRAMADOL HCL 50 MG PO TABS: 50.0000 mg | ORAL_TABLET | Freq: Three times a day (TID) | ORAL | Status: DC | PRN

## 2019-03-05 MED ORDER — SODIUM CHLORIDE 0.9 % IV BOLUS
500.0000 mL | Freq: Once | INTRAVENOUS | Status: AC
  Administered 2019-03-05: 500 mL via INTRAVENOUS

## 2019-03-05 MED ORDER — DARIFENACIN HYDROBROMIDE ER 15 MG PO TB24
15.0000 mg | ORAL_TABLET | Freq: Every day | ORAL | Status: DC
  Administered 2019-03-06 – 2019-03-07 (×2): 15 mg via ORAL
  Filled 2019-03-05 (×2): qty 1

## 2019-03-05 MED ORDER — ACETAMINOPHEN 325 MG PO TABS
650.0000 mg | ORAL_TABLET | Freq: Once | ORAL | Status: DC
  Filled 2019-03-05: qty 2

## 2019-03-05 MED ORDER — OMEGA-3-ACID ETHYL ESTERS 1 G PO CAPS
1.0000 g | ORAL_CAPSULE | Freq: Two times a day (BID) | ORAL | Status: DC
  Administered 2019-03-05 – 2019-03-07 (×4): 1 g via ORAL
  Filled 2019-03-05 (×4): qty 1

## 2019-03-05 MED ORDER — ACETAMINOPHEN 325 MG PO TABS: 325.0000 mg | ORAL_TABLET | Freq: Four times a day (QID) | ORAL | Status: DC | PRN

## 2019-03-05 MED ORDER — FUROSEMIDE 20 MG PO TABS
20.0000 mg | ORAL_TABLET | Freq: Every day | ORAL | Status: DC
  Administered 2019-03-05 – 2019-03-06 (×2): 20 mg via ORAL
  Filled 2019-03-05 (×2): qty 1

## 2019-03-05 MED ORDER — SODIUM CHLORIDE 0.9 % IV SOLN
INTRAVENOUS | Status: DC
  Administered 2019-03-05 – 2019-03-07 (×3): via INTRAVENOUS

## 2019-03-05 MED ORDER — IPRATROPIUM BROMIDE 0.06 % NA SOLN: 1.0000 | Freq: Four times a day (QID) | NASAL | Status: DC | PRN

## 2019-03-05 MED ORDER — SODIUM CHLORIDE 0.9% FLUSH
3.0000 mL | Freq: Once | INTRAVENOUS | Status: AC
  Administered 2019-03-05: 3 mL via INTRAVENOUS

## 2019-03-05 MED ORDER — SIMVASTATIN 40 MG PO TABS
40.0000 mg | ORAL_TABLET | Freq: Every evening | ORAL | Status: DC
  Administered 2019-03-05 – 2019-03-06 (×2): 40 mg via ORAL
  Filled 2019-03-05 (×2): qty 1

## 2019-03-05 MED ORDER — HYDROCORTISONE 1 % EX CREA: 1.0000 "application " | TOPICAL_CREAM | Freq: Two times a day (BID) | CUTANEOUS | Status: DC | PRN

## 2019-03-05 MED ORDER — ASPIRIN 81 MG PO CHEW
324.0000 mg | CHEWABLE_TABLET | Freq: Once | ORAL | Status: AC
  Administered 2019-03-05: 324 mg via ORAL
  Filled 2019-03-05: qty 4

## 2019-03-05 MED ORDER — PANTOPRAZOLE SODIUM 40 MG PO TBEC
40.0000 mg | DELAYED_RELEASE_TABLET | Freq: Every day | ORAL | Status: DC
  Administered 2019-03-05 – 2019-03-07 (×3): 40 mg via ORAL
  Filled 2019-03-05 (×3): qty 1

## 2019-03-05 MED ORDER — ONDANSETRON HCL 4 MG/2ML IJ SOLN
4.0000 mg | Freq: Four times a day (QID) | INTRAMUSCULAR | Status: DC | PRN
  Administered 2019-03-05 – 2019-03-06 (×2): 4 mg via INTRAVENOUS
  Filled 2019-03-05 (×2): qty 2

## 2019-03-05 MED ORDER — NYSTATIN 100000 UNIT/GM EX POWD
1.0000 | Freq: Four times a day (QID) | CUTANEOUS | Status: DC
  Administered 2019-03-05 – 2019-03-07 (×6): 1 via TOPICAL
  Filled 2019-03-05: qty 15

## 2019-03-05 MED ORDER — TRIAMCINOLONE ACETONIDE 0.1 % EX CREA
1.0000 "application " | TOPICAL_CREAM | Freq: Two times a day (BID) | CUTANEOUS | Status: DC
  Administered 2019-03-05 – 2019-03-07 (×4): 1 via TOPICAL
  Filled 2019-03-05: qty 15

## 2019-03-05 MED ORDER — TRIAMCINOLONE ACETONIDE 0.025 % EX CREA
TOPICAL_CREAM | Freq: Two times a day (BID) | CUTANEOUS | Status: DC
  Administered 2019-03-06: 10:00:00 via TOPICAL

## 2019-03-05 NOTE — ED Notes (Signed)
IV team unsuccessful  

## 2019-03-05 NOTE — H&P (Signed)
History and Physical   Cindy [name] NWG:[id number] DOB: Apr 24, 1922 DOA: 03/05/2019  Referring MD/NP/PA: Dr. Wyvonnia Dusky  PCP: Altheimer, Legrand Como, MD   Outpatient Specialists: None  Patient coming from: Home  Chief Complaint: Nausea and constipation  HPI: Cindy [name] is a 85 y.o. female with medical history significant of hypertension, chronic kidney disease stage III, hyperlipidemia, GERD, hypothyroidism, depression, who was brought in with family secondary to GERD symptoms nausea and constipation.  She was having epigastric pain she suspected to be her GERD.  She was seen in the ER and evaluated.  Chest pain was going to the back.  No associated shortness of breath or cough.  Patient describes symptoms as intermittent indigestion.  Work-up however showed abnormal findings involving the EKG as well as initial cardiac enzymes.  Suspected to have non-ST elevation MI based on her troponin as well as EKG.  Cardiology was consulted and recommended nonaggressive treatment due to patient's age.  Noninvasive in nature.  We admitted the patient for further studies and treatment.  ED Course: Temperature 97.6 blood pressure 94/66 pulse 128 respirate of 23 oxygen sat 92% on 2 L.  Sodium 135 potassium 3.6 chloride 100 CO2 18 glucose 116 BUN 95 creatinine 2.68 calcium 9.2 and gap of 17.  Lipase 51 and LFTs within normal.  Troponin was 439 then 352 then 413 and 396.  Total cholesterol 202 HDL 56 LDL 120.  White count hemoglobin and platelets are all within normal.  With 19 screen is negative.  EKG showed sinus rhythm with left bundle branch block and new T wave inversions in the lateral leads.  Dr. Johnsie Cancel of cardiology consulted and patient will be admitted for medical management  Review of Systems: As per HPI otherwise 10 point review of systems negative.    Past Medical History:  Diagnosis Date  . Arthritis   . Chronic renal insufficiency, stage III (moderate)   . Depression   . Dyslipidemia   .  Forgetfulness   . GERD (gastroesophageal reflux disease)    takes Omeprazole for Hital Hernia   . Hiatal hernia   . History of nuclear stress test 2005   persantine; inferoapical ischmeia with EF 83%  . HOH (hard of hearing)    hearing aids  . Hypertension   . Hypothyroidism   . LBBB (left bundle branch block)   . LBBB (left bundle branch block)    chronic per Dr. Gwenlyn Found  . Lung nodule 07/01/03   right middle lobe  . Macular degeneration   . Malignant melanoma ([place]) 11/12/04   L upper arm, MOHS_ right hand, leg  . Murmur, cardiac   . Osteoporosis   . Pneumonia 06/2018    Past Surgical History:  Procedure Laterality Date  . BLADDER REPAIR    . CARDIAC CATHETERIZATION  2005/2007   normal coronaries   . CATARACT EXTRACTION     bilat  . coccyx  1951   excision  . COLONOSCOPY    . knee     right cartilage repair  . REVERSE SHOULDER ARTHROPLASTY Left 08/04/2018   Procedure: REVERSE LEFT SHOULDER ARTHROPLASTY;  Surgeon: Netta Cedars, MD;  Location: [facility];  Service: Orthopedics;  Laterality: Left;  . ROTATOR CUFF REPAIR     right  . TOTAL ABDOMINAL HYSTERECTOMY W/ BILATERAL SALPINGOOPHORECTOMY    . TRANSTHORACIC ECHOCARDIOGRAM  2007   EF=>55%; borderline conc LVH; LA mod dilated; RA mild-mod dilated; mild TR; mild AVR with mildly sclerotic AV leavlefts; mild PV regurg  reports that she has never smoked. She has never used smokeless tobacco. She reports that she does not drink alcohol or use drugs.  Allergies  Allergen Reactions  . Fish Allergy Hives  . Diclofenac Other (See Comments)  . Sulfa Antibiotics     Unknown reaction    Family History  Problem Relation Age of Onset  . Cancer Father        NHL  . Cancer Maternal Grandmother        rectal  . Cancer Daughter        breast  . Cancer Maternal Aunt        breast  . Cancer Son        melanoma     Prior to Admission medications   Medication Sig Start Date End Date Taking? Authorizing Provider   acetaminophen (TYLENOL) 325 MG tablet Take 1-2 tablets (325-650 mg total) by mouth every 6 (six) hours as needed for mild pain or moderate pain (pain score 1-3 or temp > 100.5). 08/07/18  Yes Netta Cedars, MD  Apoaequorin (PREVAGEN PO) Take 1 tablet by mouth daily.   Yes [provider]  aspirin 81 MG tablet Take 81 mg by mouth daily.   Yes [provider]  cetirizine (ZYRTEC) 10 MG tablet Take 10 mg by mouth daily.   Yes [provider]  clobetasol (TEMOVATE) 0.05 % external solution Apply 1 application topically 2 (two) times a week.  04/21/18  Yes [provider]  CRANBERRY PO Take 1 tablet by mouth daily.    Yes [provider]  fluocinolone (SYNALAR) 0.025 % cream Apply 1 application topically 2 (two) times daily.   Yes [provider]  furosemide (LASIX) 20 MG tablet Take 20 mg by mouth daily.    Yes [provider]  hydrocortisone 2.5 % cream Apply 1 application topically 2 (two) times daily as needed (dry skin).  02/23/18  Yes [provider]  ipratropium (ATROVENT) 0.06 % nasal spray Place 1-2 sprays into both nostrils every 6 (six) hours as needed (rhinorrhea).   Yes [provider]  ketoconazole (NIZORAL) 2 % shampoo Apply 1 application topically 3 (three) times a week.  03/29/18  Yes [provider]  levothyroxine (SYNTHROID) 75 MCG tablet Take 75 mcg by mouth daily before breakfast.  07/01/16  Yes [provider]  mirabegron ER (MYRBETRIQ) 50 MG TB24 tablet Take 1 tablet once a day 03/01/17  Yes [provider]  Multiple Vitamins-Minerals (HAIR SKIN AND NAILS FORMULA PO) Take 1 tablet by mouth 3 (three) times daily.   Yes [provider]  nystatin (NYSTATIN) powder Apply 1 Bottle topically 4 (four) times daily.   Yes [provider]  olmesartan (BENICAR) 40 MG tablet Take 40 mg by mouth daily.  12/26/17  Yes [provider]  Omega-3 1000 MG CAPS Take 1,000  mg by mouth 2 (two) times a day.   Yes [provider]  omega-3 acid ethyl esters (LOVAZA) 1 g capsule Take 1 g by mouth 2 (two) times daily.   Yes [provider]  omeprazole (PRILOSEC) 20 MG capsule Take 20 mg by mouth daily.   Yes [provider]  simvastatin (ZOCOR) 40 MG tablet Take 40 mg by mouth every evening.    Yes [provider]  solifenacin (VESICARE) 10 MG tablet Take 10 mg by mouth daily. 01/26/19  Yes [provider]  traMADol (ULTRAM) 50 MG tablet Take 50 mg by mouth every 8 (eight)  hours as needed for moderate pain.  09/12/18  Yes [provider]  triamcinolone cream (KENALOG) 0.1 % Apply 1 application topically 2 (two) times daily.   Yes [provider]  HYDROcodone-acetaminophen (NORCO/VICODIN) 5-325 MG tablet Take 1 tablet by mouth every 6 (six) hours as needed for severe pain (pain). Patient not taking: Reported on 03/05/2019 08/07/18   Netta Cedars, MD    Physical Exam: Vitals:   03/05/19 1500 03/05/19 1530 03/05/19 1600 03/05/19 1700  BP: (!) 95/59 112/65 113/62 111/90  Pulse:  61 60 (!) 59  Resp:  20 18 (!) 23  Temp:      TempSrc:      SpO2:  92% 93% 94%  Weight:      Height:          Constitutional: Frail, weak, no acute distress Vitals:   03/05/19 1500 03/05/19 1530 03/05/19 1600 03/05/19 1700  BP: (!) 95/59 112/65 113/62 111/90  Pulse:  61 60 (!) 59  Resp:  20 18 (!) 23  Temp:      TempSrc:      SpO2:  92% 93% 94%  Weight:      Height:       Eyes: PERRL, lids and conjunctivae normal ENMT: Mucous membranes are moist. Posterior pharynx clear of any exudate or lesions.Normal dentition.  Neck: normal, supple, no masses, no thyromegaly Respiratory: clear to auscultation bilaterally, no wheezing, no crackles. Normal respiratory effort. No accessory muscle use.  Cardiovascular: Sinus tachycardia,, no murmurs / rubs / gallops. No extremity edema. 2+ pedal pulses. No carotid bruits.  Abdomen: no  tenderness, no masses palpated. No hepatosplenomegaly. Bowel sounds positive.  Musculoskeletal: no clubbing / cyanosis. No joint deformity upper and lower extremities. Good ROM, no contractures. Normal muscle tone.  Skin: no rashes, lesions, ulcers. No induration Neurologic: CN 2-12 grossly intact. Sensation intact, DTR normal. Strength 5/5 in all 4.  Psychiatric: Normal judgment and insight. Alert and oriented x 3. Normal mood.     Labs on Admission: I have personally reviewed following labs and imaging studies  CBC: Recent Labs  Lab 03/05/19 1202  WBC 7.4  HGB 13.5  HCT 41.1  MCV 96.5  PLT 660*   Basic Metabolic Panel: Recent Labs  Lab 03/05/19 1202  NA 135  K 3.6  CL 100  CO2 18*  GLUCOSE 116*  BUN 95*  CREATININE 2.68*  CALCIUM 9.2   GFR: Estimated Creatinine Clearance: 10.2 mL/min (A) (by C-G formula based on SCr of 2.68 mg/dL (H)). Liver Function Tests: Recent Labs  Lab 03/05/19 1202  AST 39  ALT 15  ALKPHOS 45  BILITOT 1.2  PROT 7.3  ALBUMIN 3.4*   Recent Labs  Lab 03/05/19 1202  LIPASE 51   No results for input(s): AMMONIA in the last 168 hours. Coagulation Profile: No results for input(s): INR, PROTIME in the last 168 hours. Cardiac Enzymes: No results for input(s): CKTOTAL, CKMB, CKMBINDEX, TROPONINI in the last 168 hours. BNP (last 3 results) No results for input(s): PROBNP in the last 8760 hours. HbA1C: No results for input(s): HGBA1C in the last 72 hours. CBG: No results for input(s): GLUCAP in the last 168 hours. Lipid Profile: No results for input(s): CHOL, HDL, LDLCALC, TRIG, CHOLHDL, LDLDIRECT in the last 72 hours. Thyroid Function Tests: No results for input(s): TSH, T4TOTAL, FREET4, T3FREE, THYROIDAB in the last 72 hours. Anemia Panel: No results for input(s): VITAMINB12, FOLATE, FERRITIN, TIBC, IRON, RETICCTPCT in the last 72 hours. Urine analysis:  Component Value Date/Time   COLORURINE YELLOW 03/05/2019 1051    APPEARANCEUR HAZY (A) 03/05/2019 1051   LABSPEC 1.012 03/05/2019 1051   PHURINE 5.0 03/05/2019 1051   GLUCOSEU NEGATIVE 03/05/2019 1051   HGBUR NEGATIVE 03/05/2019 1051   BILIRUBINUR NEGATIVE 03/05/2019 1051   KETONESUR 5 (A) 03/05/2019 1051   PROTEINUR NEGATIVE 03/05/2019 1051   UROBILINOGEN 0.2 11/22/2013 2218   NITRITE NEGATIVE 03/05/2019 1051   LEUKOCYTESUR NEGATIVE 03/05/2019 1051   Sepsis Labs: @LABRCNTIP (procalcitonin:4,lacticidven:4) )No results found for this or any previous visit (from the past 240 hour(s)).   Radiological Exams on Admission: Dg Chest Portable 1 View  Result Date: 03/05/2019 CLINICAL DATA:  No bowel movement for several days. Increasing fatigue. EXAM: PORTABLE CHEST 1 VIEW COMPARISON:  Radiographs 02/18/2019 and 06/13/2018. CT 10/18/2009. FINDINGS: 1530 hours. The heart size and mediastinal contours are stable. There is aortic atherosclerosis and a large hiatal hernia which has a paraesophageal component. There is chronic left basilar atelectasis adjacent to the hiatal hernia. The right lung is clear. There is no pleural effusion or pneumothorax. No acute osseous findings. Previous left shoulder reverse arthroplasty and distal right clavicle resection. IMPRESSION: Stable appearance of the chest with chronic left basilar atelectasis and large hiatal hernia. No acute cardiopulmonary process. Electronically Signed   By: Richardean Sale M.D.   On: 03/05/2019 16:20   Dg Abd Portable 2 Views  Result Date: 03/05/2019 CLINICAL DATA:  Increased fatigue. No bowel movement for several days. EXAM: PORTABLE ABDOMEN - 2 VIEW COMPARISON:  CT 02/18/2019. Radiographs 06/28/2016. FINDINGS: The bowel gas pattern is nonobstructive. There is no free intraperitoneal air. Pelvic phleboliths and diffuse vascular calcifications are noted. There are degenerative changes throughout the spine without apparent acute osseous findings. IMPRESSION: No acute abdominal findings. Electronically  Signed   By: Richardean Sale M.D.   On: 03/05/2019 16:17    EKG: Independently reviewed.  It shows sinus rhythm with a rate of 58, prolonged PR interval, flipped T waves in the lateral leads which is new since November 8.  Assessment/Plan Principal Problem:   NSTEMI (non-ST elevated myocardial infarction) ([place]) Active Problems:   Left bundle branch block   Essential hypertension   Hyperlipidemia   CKD (chronic kidney disease) stage 3, GFR 30-59 ml/min (H[facility])   GERD (gastroesophageal reflux disease)   Primary hypothyroidism     #1 non-ST elevation MI: Patient is going to be admitted to telemetry bed.  Cardiology has recommended echocardiogram and aspirin.  No heparin or other aggressive care recommended at this point due to age.  Patient is however fully awake and alert but looks frail.  We will continue with cardiology recommendations  #2 hypertension: Continue home regimen with treatment.  #3 chronic kidney disease stage III: Close to baseline.  Continue monitor  #4 GERD: Continue with PPIs  #5 hypothyroidism: Replete.  #6 persistent nausea: Has not responded adequately to Zofran.  I will initiate 6.25 mg of promethazine every 6 hours as needed   DVT prophylaxis: Lovenox Code Status: DNR Family Communication: Daughter over the phone Disposition Plan: Home Consults called: Dr. Johnsie Cancel, cardiology Admission status: Inpatient  Severity of Illness: The appropriate patient status for this patient is INPATIENT. Inpatient status is judged to be reasonable and necessary in order to provide the required intensity of service to ensure the patient's safety. The patient's presenting symptoms, physical exam findings, and initial radiographic and laboratory data in the context of their chronic comorbidities is felt to place them at high risk for further  clinical deterioration. Furthermore, it is not anticipated that the patient will be medically stable for discharge from the hospital within 2  midnights of admission. The following factors support the patient status of inpatient.   " The patient's presenting symptoms include epigastric pain sounded like indigestion. " The worrisome physical exam findings include frail woman no acute distress. " The initial radiographic and laboratory data are worrisome because of elevated troponins. " The chronic co-morbidities include hypertension.   * I certify that at the point of admission it is my clinical judgment that the patient will require inpatient hospital care spanning beyond 2 midnights from the point of admission due to high intensity of service, high risk for further deterioration and high frequency of surveillance required.Barbette Merino MD [facility] Pager [phone]  If 7PM-7AM, please contact night-coverage [email or web address] Password [facility]  03/05/2019, 6:23 PM

## 2019-03-05 NOTE — ED Notes (Signed)
Date and time results received: 03/05/19 3:31 PM  (use smartphrase ".now" to insert current time)  Test: Trop Critical Value: [id number]  Name of Provider Notified: [facility]  Orders Received? Or Actions Taken?: Actions Taken: awaiting orders

## 2019-03-05 NOTE — ED Provider Notes (Signed)
[facility] DEPT Provider Note   CSN: [id number] Arrival date & time: 03/05/19  1021     History   Chief Complaint Chief Complaint  Patient presents with  . Nausea  . Constipation  . Gastroesophageal Reflux    HPI Cindy [name] is a 85 y.o. female.     Level 5 caveat for altered mental status.  Patient with history of CKD, acid reflux, hiatal hernia, left bundle branch block, malignant melanoma presenting from home with a several day history of decreased appetite, lethargy, weakness, fatigue, "intermittent indigestion" as well as constipation.  Caregiver at bedside gives most of the history.  Caregiver states she has not been herself for the past 2 or 3 days and not been eating or drinking.  She has had nausea but no vomiting.  Last BM was approximately 5 or 6 days ago.  BM has not been black or bloody.  She has been complaining of indigestion feeling in her chest and food not going down.  She denies any chest pain currently.  She states that she gets the pain last for just a few seconds at a time.  Reports no CAD history.  She does take omeprazole for her history of acid reflux and hiatal hernia.  The history is provided by the patient. The history is limited by the condition of the patient.  Constipation Associated symptoms: nausea   Associated symptoms: no abdominal pain, no dysuria, no fever and no vomiting   Gastroesophageal Reflux Pertinent negatives include no chest pain, no abdominal pain, no headaches and no shortness of breath.    Past Medical History:  Diagnosis Date  . Arthritis   . Chronic renal insufficiency, stage III (moderate)   . Depression   . Dyslipidemia   . Forgetfulness   . GERD (gastroesophageal reflux disease)    takes Omeprazole for Hital Hernia   . Hiatal hernia   . History of nuclear stress test 2005   persantine; inferoapical ischmeia with EF 83%  . HOH (hard of hearing)    hearing aids  . Hypertension   .  Hypothyroidism   . LBBB (left bundle branch block)   . LBBB (left bundle branch block)    chronic per Dr. Gwenlyn Found  . Lung nodule 07/01/03   right middle lobe  . Macular degeneration   . Malignant melanoma ([place]) 11/12/04   L upper arm, MOHS_ right hand, leg  . Murmur, cardiac   . Osteoporosis   . Pneumonia 06/2018    Patient Active Problem List   Diagnosis Date Noted  . Pain due to onychomycosis of toenails of both feet 10/03/2018  . H/O total shoulder replacement, left 08/04/2018  . Pneumonia 06/13/2018  . Pressure injury of skin 06/13/2018  . Abdominal pain 11/08/2016  . Acute lower UTI 11/08/2016  . Acute cystitis without hematuria   . SBO (small bowel obstruction) ([place]) 06/28/2016  . Constipation, chronic 06/28/2016  . Acute distention of stomach 06/28/2016  . Eventration of left crus of diaphragm 06/27/2016  . Uncontrolled hypertension 06/27/2016  . Gastric outlet obstruction 06/27/2016  . CKD (chronic kidney disease) stage 3, GFR 30-59 ml/min (HCC) 06/14/2016  . Primary hypothyroidism 06/14/2016  . Osteoporosis, postmenopausal 06/14/2016  . OAB (overactive bladder) 02/25/2016  . Left bundle branch block 12/14/2012  . Essential hypertension 12/14/2012  . Hyperlipidemia 12/14/2012  . Pain in shoulder 11/11/2011  . Melanoma of left upper arm ([place]) 10/19/2011  . Lung mass 06/16/2011  . Acquired deformities of toe  06/14/2011  . Personal history of other diseases of the digestive system 06/07/2011  . GERD (gastroesophageal reflux disease) 06/07/2011    Past Surgical History:  Procedure Laterality Date  . BLADDER REPAIR    . CARDIAC CATHETERIZATION  2005/2007   normal coronaries   . CATARACT EXTRACTION     bilat  . coccyx  1951   excision  . COLONOSCOPY    . knee     right cartilage repair  . REVERSE SHOULDER ARTHROPLASTY Left 08/04/2018   Procedure: REVERSE LEFT SHOULDER ARTHROPLASTY;  Surgeon: Netta Cedars, MD;  Location: [facility];  Service: Orthopedics;  Laterality:  Left;  . ROTATOR CUFF REPAIR     right  . TOTAL ABDOMINAL HYSTERECTOMY W/ BILATERAL SALPINGOOPHORECTOMY    . TRANSTHORACIC ECHOCARDIOGRAM  2007   EF=>55%; borderline conc LVH; LA mod dilated; RA mild-mod dilated; mild TR; mild AVR with mildly sclerotic AV leavlefts; mild PV regurg      OB History   No obstetric history on file.      Home Medications    Prior to Admission medications   Medication Sig Start Date End Date Taking? Authorizing Provider  acetaminophen (TYLENOL) 325 MG tablet Take 1-2 tablets (325-650 mg total) by mouth every 6 (six) hours as needed for mild pain or moderate pain (pain score 1-3 or temp > 100.5). 08/07/18   Netta Cedars, MD  Apoaequorin (PREVAGEN PO) Take 1 tablet by mouth daily.    [provider]  aspirin 81 MG tablet Take 81 mg by mouth daily.    [provider]  Aspirin Buf,CaCarb-MgCarb-MgO, 81 MG TABS aspirin 81 mg tablet   81 mg by oral route.    [provider]  cephALEXin (KEFLEX) 500 MG capsule cephalexin 500 mg capsule  TK 1 C PO Q 6 H    [provider]  clobetasol (TEMOVATE) 0.05 % external solution Apply 1 application topically 2 (two) times a week.  04/21/18   [provider]  CRANBERRY PO Take 1 tablet by mouth daily.     [provider]  furosemide (LASIX) 20 MG tablet furosemide 20 mg tablet   20 mg by oral route.    [provider]  HYDROcodone-acetaminophen (NORCO/VICODIN) 5-325 MG tablet Take 1 tablet by mouth every 6 (six) hours as needed for severe pain (pain). 08/07/18   Netta Cedars, MD  hydrocortisone 2.5 % cream Apply 1 application topically 2 (two) times daily as needed (dry skin).  02/23/18   [provider]  ipratropium (ATROVENT) 0.06 % nasal spray Place 1-2 sprays into both nostrils every 6 (six) hours as needed (rhinorrhea).    [provider]  ketoconazole (NIZORAL) 2 % shampoo Apply 1 application topically 3 (three) times a week.  03/29/18    [provider]  levothyroxine (SYNTHROID) 75 MCG tablet levothyroxine 75 mcg tablet   75 ugs by oral route. 07/01/16   [provider]  loratadine (CLARITIN) 10 MG tablet loratadine 10 mg tablet   10 mg by oral route.    [provider]  mirabegron ER (MYRBETRIQ) 50 MG TB24 tablet Take 1 tablet once a day 03/01/17   [provider]  Multiple Vitamin (MULTIVITAMIN) capsule Take 1 capsule by mouth daily.    [provider]  Multiple Vitamins-Minerals (HAIR SKIN AND NAILS FORMULA PO) Take 1 tablet by mouth 3 (three) times daily.    [provider]  olmesartan (BENICAR) 40 MG tablet Take 40 mg by mouth daily.  12/26/17  [provider]  Omega-3 1000 MG CAPS Take 1,000 mg by mouth 2 (two) times a day.    [provider]  omeprazole (PRILOSEC) 20 MG capsule Take 20 mg by mouth daily.    [provider]  Pseudoephedrine-APAP 30-325 MG TABS acetaminophen 325 mg tablet 08/07/18   [provider]  simvastatin (ZOCOR) 40 MG tablet Take 40 mg by mouth every evening.     [provider]  traMADol (ULTRAM) 50 MG tablet TK 1 T PO Q 8 H PRF PAIN 09/12/18   [provider]  UNABLE TO FIND Take 1 tablet once a day    [provider]  UNABLE TO FIND omega-3 acid ethyl esters 1 gram capsule   1000 mg twice a day by oral route.    [provider]  UNABLE TO FIND omeprazole 20 mg capsule,delayed release   20 mg by oral route.    [provider]    Family History Family History  Problem Relation Age of Onset  . Cancer Father        NHL  . Cancer Maternal Grandmother        rectal  . Cancer Daughter        breast  . Cancer Maternal Aunt        breast  . Cancer Son        melanoma    Social History Social History   Tobacco Use  . Smoking status: Never Smoker  . Smokeless tobacco: Never Used  Substance Use Topics  . Alcohol use: No  . Drug use: No     Allergies    Fish allergy, Diclofenac, and Sulfa antibiotics   Review of Systems Review of Systems  Constitutional: Negative for activity change, appetite change and fever.  HENT: Negative for congestion, rhinorrhea and sinus pain.   Respiratory: Positive for chest tightness. Negative for cough and shortness of breath.   Cardiovascular: Negative for chest pain.  Gastrointestinal: Positive for constipation and nausea. Negative for abdominal distention, abdominal pain and vomiting.  Genitourinary: Negative for dysuria and hematuria.  Musculoskeletal: Negative for arthralgias and myalgias.  Skin: Negative for rash.  Neurological: Negative for dizziness, weakness and headaches.    all other systems are negative except as noted in the HPI and PMH.    Physical Exam Updated Vital Signs BP (!) 95/59   Pulse 63   Temp 97.6 F (36.4 C) (Rectal)   Resp 15   Ht 5\' 2"  (1.575 m)   Wt 52.6 kg   SpO2 100%   BMI 21.22 kg/m   Physical Exam Vitals signs and nursing note reviewed.  Constitutional:      General: She is not in acute distress.    Appearance: She is well-developed.     Comments: Chronically ill-appearing, resting with eyes closed  HENT:     Head: Normocephalic and atraumatic.     Mouth/Throat:     Mouth: Mucous membranes are moist.     Pharynx: No oropharyngeal exudate.  Eyes:     Conjunctiva/sclera: Conjunctivae normal.     Pupils: Pupils are equal, round, and reactive to light.  Neck:     Musculoskeletal: Normal range of motion and neck supple.     Comments: No meningismus. Cardiovascular:     Rate and Rhythm: Normal rate and regular rhythm.     Heart sounds: Normal heart sounds. No murmur.  Pulmonary:     Effort: Pulmonary effort is normal. No respiratory distress.  Breath sounds: Normal breath sounds.  Abdominal:     Palpations: Abdomen is soft.     Tenderness: There is no abdominal tenderness. There is no guarding or rebound.     Comments: Equal femoral pulses   Genitourinary:    Comments: No fecal impaction, no gross blood Musculoskeletal: Normal range of motion.        General: No tenderness.  Skin:    General: Skin is warm.  Neurological:     Mental Status: She is alert.     Cranial Nerves: No cranial nerve deficit.     Motor: No abnormal muscle tone.     Coordination: Coordination normal.     Comments: Patient unable to give any history.  She denies any pain now.  She moves all extremities equally follows commands.  Psychiatric:        Behavior: Behavior normal.      ED Treatments / Results  Labs (all labs ordered are listed, but only abnormal results are displayed) Labs Reviewed  COMPREHENSIVE METABOLIC PANEL - Abnormal; Notable for the following components:      Result Value   CO2 18 (*)    Glucose, Bld 116 (*)    BUN 95 (*)    Creatinine, Ser 2.68 (*)    Albumin 3.4 (*)    GFR calc non Af Amer 15 (*)    GFR calc Af Amer 17 (*)    Anion gap 17 (*)    All other components within normal limits  CBC - Abnormal; Notable for the following components:   Platelets 421 (*)    All other components within normal limits  URINALYSIS, ROUTINE W REFLEX MICROSCOPIC - Abnormal; Notable for the following components:   APPearance HAZY (*)    Ketones, ur 5 (*)    All other components within normal limits  LIPID PANEL - Abnormal; Notable for the following components:   Cholesterol 202 (*)    LDL Cholesterol 120 (*)    All other components within normal limits  POC OCCULT BLOOD, ED - Abnormal; Notable for the following components:   Fecal Occult Bld POSITIVE (*)    All other components within normal limits  TROPONIN I (HIGH SENSITIVITY) - Abnormal; Notable for the following components:   Troponin I (High Sensitivity) 439 (*)    All other components within normal limits  TROPONIN I (HIGH SENSITIVITY) - Abnormal; Notable for the following components:   Troponin I (High Sensitivity) 352 (*)    All other components within normal limits   TROPONIN I (HIGH SENSITIVITY) - Abnormal; Notable for the following components:   Troponin I (High Sensitivity) 413 (*)    All other components within normal limits  TROPONIN I (HIGH SENSITIVITY) - Abnormal; Notable for the following components:   Troponin I (High Sensitivity) 396 (*)    All other components within normal limits  SARS CORONAVIRUS 2 (TAT 6-24 HRS)  LIPASE, BLOOD    EKG EKG Interpretation  Date/Time:  Monday March 05 2019 15:02:54 EST Ventricular Rate:  58 PR Interval:    QRS Duration: 158 QT Interval:  619 QTC Calculation: 609 R Axis:   -43 Text Interpretation: Sinus rhythm Left bundle branch block new T wave inversions Confirmed by Ezequiel Essex [phone]) on 03/05/2019 3:06:11 PM   Radiology Ct Abdomen Pelvis Wo Contrast  Result Date: 03/05/2019 CLINICAL DATA:  Nausea and vomiting EXAM: CT ABDOMEN AND PELVIS WITHOUT CONTRAST TECHNIQUE: Multidetector CT imaging of the abdomen and pelvis was performed following the standard  protocol without IV contrast. COMPARISON:  CT 02/18/2019, 11/08/2016 FINDINGS: Lower chest: 14 mm right middle lobe pulmonary nodule near the hilum, series 9, image number 6. Appears slightly larger compared with 2013 chest CT. Large incompletely visualized hiatal hernia containing mesentery, stomach and colon. Small left-sided pleural effusion. The heart size is normal. Hepatobiliary: No focal liver abnormality is seen. Small calcified gallstones. No biliary dilatation Pancreas: Unremarkable. No pancreatic ductal dilatation or surrounding inflammatory changes. Spleen: Normal in size without focal abnormality. Adrenals/Urinary Tract: Adrenal glands are normal. Kidneys show no hydronephrosis. There are bilateral renal cysts. The urinary bladder contains a small amount of air. Stomach/Bowel: The stomach is nonenlarged. No dilated small bowel. No colon wall thickening. Sigmoid colon diverticular disease without acute inflammatory process.  Vascular/Lymphatic: Moderate to marked aortic atherosclerosis without aneurysm. 15 mm left Peri aortic lymph node. Reproductive: Status post hysterectomy. No adnexal masses. Other: Negative for free air or free fluid. Musculoskeletal: Degenerative changes of the spine. Stable 12 mm anterolisthesis L4 on L5. IMPRESSION: 1. No CT evidence for acute intra-abdominal or pelvic abnormality. 2. Incompletely visualized large hiatal hernia. 3. 14 mm right middle lobe pulmonary nodule, appears slightly enlarged as compared with chest CT from 2013. Pulmonary consultation may be considered. 4. Gallstones 5. Sigmoid colon diverticular disease without acute inflammatory change 6. Decreased air within the urinary bladder 7. 15 mm left retroperitoneal node either reactive or neoplastic in etiology. Electronically Signed   By: Donavan Foil M.D.   On: 03/05/2019 18:48   Ct Head Wo Contrast  Result Date: 03/05/2019 CLINICAL DATA:  Altered consciousness. EXAM: CT HEAD WITHOUT CONTRAST TECHNIQUE: Contiguous axial images were obtained from the base of the skull through the vertex without intravenous contrast. COMPARISON:  06/17/2012, report only. FINDINGS: Brain: Expected cerebral and cerebellar volume loss for age. Relatively mild for age low density in the periventricular white matter likely related to small vessel disease. No mass lesion, hemorrhage, hydrocephalus, acute infarct, intra-axial, or extra-axial fluid collection. Vascular: Intracranial atherosclerosis. Skull: No significant soft tissue swelling.  No skull fracture. Sinuses/Orbits: Normal imaged portions of the orbits and globes. Hypoplastic frontal sinuses. Clear mastoid air cells. Other: None. IMPRESSION: 1.  No acute intracranial abnormality. 2.  Cerebral atrophy and small vessel ischemic change. Electronically Signed   By: Abigail Miyamoto M.D.   On: 03/05/2019 18:21   Dg Chest Portable 1 View  Result Date: 03/05/2019 CLINICAL DATA:  No bowel movement for several  days. Increasing fatigue. EXAM: PORTABLE CHEST 1 VIEW COMPARISON:  Radiographs 02/18/2019 and 06/13/2018. CT 10/18/2009. FINDINGS: 1530 hours. The heart size and mediastinal contours are stable. There is aortic atherosclerosis and a large hiatal hernia which has a paraesophageal component. There is chronic left basilar atelectasis adjacent to the hiatal hernia. The right lung is clear. There is no pleural effusion or pneumothorax. No acute osseous findings. Previous left shoulder reverse arthroplasty and distal right clavicle resection. IMPRESSION: Stable appearance of the chest with chronic left basilar atelectasis and large hiatal hernia. No acute cardiopulmonary process. Electronically Signed   By: Richardean Sale M.D.   On: 03/05/2019 16:20   Dg Abd Portable 2 Views  Result Date: 03/05/2019 CLINICAL DATA:  Increased fatigue. No bowel movement for several days. EXAM: PORTABLE ABDOMEN - 2 VIEW COMPARISON:  CT 02/18/2019. Radiographs 06/28/2016. FINDINGS: The bowel gas pattern is nonobstructive. There is no free intraperitoneal air. Pelvic phleboliths and diffuse vascular calcifications are noted. There are degenerative changes throughout the spine without apparent acute osseous findings. IMPRESSION: No  acute abdominal findings. Electronically Signed   By: Richardean Sale M.D.   On: 03/05/2019 16:17    Procedures .Critical Care Performed by: Ezequiel Essex, MD Authorized by: Ezequiel Essex, MD   Critical care provider statement:    Critical care time (minutes):  35   Critical care was time spent personally by me on the following activities:  Discussions with consultants, evaluation of patient's response to treatment, examination of patient, ordering and performing treatments and interventions, ordering and review of laboratory studies, ordering and review of radiographic studies, pulse oximetry, re-evaluation of patient's condition, obtaining history from patient or surrogate and review of old  charts   (including critical care time)  Medications Ordered in ED Medications  sodium chloride flush (NS) 0.9 % injection 3 mL (has no administration in time range)  sodium chloride 0.9 % bolus 500 mL (has no administration in time range)     Initial Impression / Assessment and Plan / ED Course  I have reviewed the triage vital signs and the nursing notes.  Pertinent labs & imaging results that were available during my care of the patient were reviewed by me and considered in my medical decision making (see chart for details).       Patient with several days of fatigue, anorexia, constipation and "indigestion".  She denies any chest pain currently.  Her EKG shows a left bundle block with new T wave inversions.   CXR and AXR stable.  Labs with AKI and elevated troponin. IVF started, ASA given.   patient saw her cardiologist Dr. Gwenlyn Found 1 month ago.  She has a history of clean coronaries by catheterization. Creatinine 2.8 has been elevated in the mid 2 range for the past several draws.  EKG has new T wave inversions anteriorly.  Troponin is 439.  She denies any active chest pain.  Discussed with cardiology Dr. Johnsie Cancel He states given patient's age and known clean coronaries, would not recommend more than an echocardiogram and trending troponins.  Does not recommend heparin.  CT shows hiatal hernia, gallstones, pulmonary nodule. No acute pathology.  Ongoing nausea in the ED without vomiting.   Plan admission for workup of AKI and NSTEMI.  D/w Dr. Jonelle Sidle. Caregiver confirms DNR/DNI.   Cindy [name] was evaluated in Emergency Department on 03/05/2019 for the symptoms described in the history of present illness. She was evaluated in the context of the global COVID-19 pandemic, which necessitated consideration that the patient might be at risk for infection with the SARS-CoV-2 virus that causes COVID-19. Institutional protocols and algorithms that pertain to the evaluation of patients at  risk for COVID-19 are in a state of rapid change based on information released by regulatory bodies including the CDC and federal and state organizations. These policies and algorithms were followed during the patient's care in the ED.  Final Clinical Impressions(s) / ED Diagnoses   Final diagnoses:  None    ED Discharge Orders    None       Banyan Goodchild, Annie Main, MD 03/06/19 0110

## 2019-03-05 NOTE — ED Notes (Addendum)
Heart rhythm showing afib. Hospitalist paged about rhythm and repeat troponin result. Patient denies chest pain, abdominal pain, or SOB at this time. Patient still endorsing nausea and dry heaves at this time.

## 2019-03-05 NOTE — ED Triage Notes (Signed)
Patient's caregiver, Maurice March reported that  she has not had a normal BM in approx 5-6 days as far as he is aware. Caregiver states she had a small BM yesterday. Patient has been c/o acid reflux and feeling like her food is not going down.  Caregiver also reports feeling of fatigue.

## 2019-03-05 NOTE — ED Notes (Signed)
Patient currently dry heaving and complaining of nausea. No episodes of emesis at this time.

## 2019-03-05 NOTE — ED Notes (Signed)
X-Ray at bedside.

## 2019-03-05 NOTE — ED Notes (Signed)
Attempted to stick x2 for blood, another RN will attempt.

## 2019-03-05 NOTE — ED Notes (Signed)
IV team at bedside 

## 2019-03-06 ENCOUNTER — Inpatient Hospital Stay (HOSPITAL_COMMUNITY): Payer: Medicare Other

## 2019-03-06 ENCOUNTER — Encounter (HOSPITAL_COMMUNITY): Payer: Self-pay | Admitting: Physician Assistant

## 2019-03-06 DIAGNOSIS — I351 Nonrheumatic aortic (valve) insufficiency: Secondary | ICD-10-CM

## 2019-03-06 DIAGNOSIS — I361 Nonrheumatic tricuspid (valve) insufficiency: Secondary | ICD-10-CM

## 2019-03-06 LAB — BASIC METABOLIC PANEL
Anion gap: 15 (ref 5–15)
BUN: 89 mg/dL — ABNORMAL HIGH (ref 8–23)
CO2: 18 mmol/L — ABNORMAL LOW (ref 22–32)
Calcium: 8.4 mg/dL — ABNORMAL LOW (ref 8.9–10.3)
Chloride: 105 mmol/L (ref 98–111)
Creatinine, Ser: 2.28 mg/dL — ABNORMAL HIGH (ref 0.44–1.00)
GFR calc Af Amer: 21 mL/min — ABNORMAL LOW (ref >60)
GFR calc non Af Amer: 18 mL/min — ABNORMAL LOW (ref >60)
Glucose, Bld: 76 mg/dL (ref 70–99)
Potassium: 3.3 mmol/L — ABNORMAL LOW (ref 3.5–5.1)
Sodium: 138 mmol/L (ref 135–145)

## 2019-03-06 LAB — HEMOGLOBIN A1C
Hgb A1c MFr Bld: 6 % — ABNORMAL HIGH (ref 4.8–5.6)
Mean Plasma Glucose: 125.5 mg/dL

## 2019-03-06 LAB — ECHOCARDIOGRAM COMPLETE
Height: 62 in
Weight: 1856 oz

## 2019-03-06 MED ORDER — POTASSIUM CHLORIDE CRYS ER 20 MEQ PO TBCR: 40.0000 meq | EXTENDED_RELEASE_TABLET | Freq: Once | ORAL | Status: DC

## 2019-03-06 MED ORDER — ALUM & MAG HYDROXIDE-SIMETH 200-200-20 MG/5ML PO SUSP
30.0000 mL | ORAL | Status: DC | PRN
  Filled 2019-03-06: qty 30

## 2019-03-06 MED ORDER — ACETAMINOPHEN 325 MG PO TABS: 325.0000 mg | ORAL_TABLET | Freq: Four times a day (QID) | ORAL | Status: DC | PRN

## 2019-03-06 NOTE — Progress Notes (Signed)
0120 Patient arrived from ED via stretcher. Patient alert and oriented. She answered questions appropriately. Pain denied pain. Tele monitor applied and verified with NT and tele monitoring personnel. Skin assessment completed and verified by charge nurse, Lesly Rubenstein. IVF's continued. Bed low position, assist rails in use, bed alarm on, and call light within reach.

## 2019-03-06 NOTE — Progress Notes (Addendum)
PROGRESS NOTE    Cindy [name]  XTA:[id number] DOB: 05/23/1922 DOA: 03/05/2019 PCP: Lorne Skeens, MD   Brief Narrative:  Patient is a 85 year old female with history of hypertension, chronic kidney disease stage III, hyperlipidemia, GERD, hypothyroidism, depression who presented to the emergency department with complaints of GERD symptoms, nausea, constipation.  She was complaining of epigastric pain.  There was nauseated shortness of breath or cough.  EKG was positive for ischemic changes.  Cardiac enzymes were elevated.  She was admitted for the management of NSTEMI.  Cardiology consulted.  Not on heparin drip due to advanced age.  Assessment & Plan:   Principal Problem:   NSTEMI (non-ST elevated myocardial infarction) ([place]) Active Problems:   Left bundle branch block   Essential hypertension   Hyperlipidemia   CKD (chronic kidney disease) stage 3, GFR 30-59 ml/min ([facility])   GERD (gastroesophageal reflux disease)   Primary hypothyroidism   NSTEMI: Presented with GERD symptoms, had EKG changes.  Elevated troponin.  Not on heparin drip.  Cardizem following.  Echocardiogram shows preserved ejection fraction of 65-53%, grade 2 diastolic dysfunction, no wall motion abnormality.  This morning she is chest pain-free.  Denies any shortness of breath.  Hemodynamically stable.  Cardiology following.  Continue aspirin. Significant need for invasive cardiac evaluation.  Cardiology recommended to continue aspirin and statin.  Hypertension: Currently blood pressure stable.  Continue home regimen  CKD stage III: Creatinine on 4/20 was 1.8.  Presented with creatinine of 2.6.  Continue gentle IV fluids.  GERD: Continue PPI  Hyperlipidemia: Continue Lipitor.  Hypothyroidism: Continue Synthyroid  Nausea: Continue Zofran as needed.  Debility/deconditioning: Patient lives alone but has home aide.  Her son lives in [place].  Patient states she can ambulate with the help of walker.  She also  drives sometimes.We will request PT/OT         DVT prophylaxis:Lovenox Code Status: Full Family Communication: None present at the bedside Disposition Plan: Waiting for PT/OT evaluation.   Consultants: Cardiology  Procedures: None  Antimicrobials:  Anti-infectives (From admission, onward)   None      Subjective: Patient seen and examined at bedside this morning.  Very pleasant, elderly female.  Denies any complaints.  Comfortable.  No chest pain or shortness of breath.  Objective: Vitals:   03/06/19 0053 03/06/19 0131 03/06/19 0603 03/06/19 1341  BP: (!) 91/57 101/60 (!) 91/48 (!) 99/54  Pulse: 60 61 (!) 57 (!) 57  Resp: 17 20 20 16   Temp:  (!) 97.4 F (36.3 C) (!) 97.4 F (36.3 C) (!) 97.4 F (36.3 C)  TempSrc:  Oral Oral Oral  SpO2: 93% 99% 100% 90%  Weight:      Height:        Intake/Output Summary (Last 24 hours) at 03/06/2019 1426 Last data filed at 03/06/2019 0600 Gross per 24 hour  Intake 710.34 ml  Output -  Net 710.34 ml   Filed Weights   03/05/19 1047  Weight: 52.6 kg    Examination:  General exam: Appears calm and comfortable ,Not in distress, pleasant frail elderly female  HEENT:PERRL, Ear/Nose normal on gross exam Respiratory system: Bilateral equal air entry, normal vesicular breath sounds, no wheezes or crackles  Cardiovascular system: S1 & S2 heard, RRR. No JVD, murmurs, rubs, gallops or clicks. No pedal edema. Gastrointestinal system: Abdomen is nondistended, soft and nontender. No organomegaly or masses felt. Normal bowel sounds heard. Central nervous system: Alert and oriented. No focal neurological deficits. Extremities: No edema, no clubbing ,no cyanosis,  distal peripheral pulses palpable. Skin: No rashes, lesions or ulcers,no icterus ,no pallor   Data Reviewed: I have personally reviewed following labs and imaging studies  CBC: Recent Labs  Lab 03/05/19 1202  WBC 7.4  HGB 13.5  HCT 41.1  MCV 96.5  PLT 421*   Basic  Metabolic Panel: Recent Labs  Lab 03/05/19 1202 03/06/19 0758  NA 135 138  K 3.6 3.3*  CL 100 105  CO2 18* 18*  GLUCOSE 116* 76  BUN 95* 89*  CREATININE 2.68* 2.28*  CALCIUM 9.2 8.4*   GFR: Estimated Creatinine Clearance: 11.9 mL/min (A) (by C-G formula based on SCr of 2.28 mg/dL (H)). Liver Function Tests: Recent Labs  Lab 03/05/19 1202  AST 39  ALT 15  ALKPHOS 45  BILITOT 1.2  PROT 7.3  ALBUMIN 3.4*   Recent Labs  Lab 03/05/19 1202  LIPASE 51   No results for input(s): AMMONIA in the last 168 hours. Coagulation Profile: No results for input(s): INR, PROTIME in the last 168 hours. Cardiac Enzymes: No results for input(s): CKTOTAL, CKMB, CKMBINDEX, TROPONINI in the last 168 hours. BNP (last 3 results) No results for input(s): PROBNP in the last 8760 hours. HbA1C: No results for input(s): HGBA1C in the last 72 hours. CBG: No results for input(s): GLUCAP in the last 168 hours. Lipid Profile: Recent Labs    03/05/19 1958  CHOL 202*  HDL 56  LDLCALC 120*  TRIG 130  CHOLHDL 3.6   Thyroid Function Tests: No results for input(s): TSH, T4TOTAL, FREET4, T3FREE, THYROIDAB in the last 72 hours. Anemia Panel: No results for input(s): VITAMINB12, FOLATE, FERRITIN, TIBC, IRON, RETICCTPCT in the last 72 hours. Sepsis Labs: No results for input(s): PROCALCITON, LATICACIDVEN in the last 168 hours.  Recent Results (from the past 240 hour(s))  SARS CORONAVIRUS 2 (TAT 6-24 HRS) Nasopharyngeal Nasopharyngeal Swab     Status: None   Collection Time: 03/05/19  3:43 PM   Specimen: Nasopharyngeal Swab  Result Value Ref Range Status   SARS Coronavirus 2 NEGATIVE NEGATIVE Final    Comment: (NOTE) SARS-CoV-2 target nucleic acids are NOT DETECTED. The SARS-CoV-2 RNA is generally detectable in upper and lower respiratory specimens during the acute phase of infection. Negative results do not preclude SARS-CoV-2 infection, do not rule out co-infections with other pathogens, and  should not be used as the sole basis for treatment or other patient management decisions. Negative results must be combined with clinical observations, patient history, and epidemiological information. The expected result is Negative. Fact Sheet for Patients: SugarRoll.be Fact Sheet for Healthcare Providers: https://www.woods-mathews.com/ This test is not yet approved or cleared by the [place] FDA and  has been authorized for detection and/or diagnosis of SARS-CoV-2 by FDA under an Emergency Use Authorization (EUA). This EUA will remain  in effect (meaning this test can be used) for the duration of the COVID-19 declaration under Section 56 4(b)(1) of the Act, 21 U.S.C. section 360bbb-3(b)(1), unless the authorization is terminated or revoked sooner. Performed at [facility], [place] [address], [place], [place] [address]          Radiology Studies: Ct Abdomen Pelvis Wo Contrast  Result Date: 03/05/2019 CLINICAL DATA:  Nausea and vomiting EXAM: CT ABDOMEN AND PELVIS WITHOUT CONTRAST TECHNIQUE: Multidetector CT imaging of the abdomen and pelvis was performed following the standard protocol without IV contrast. COMPARISON:  CT 02/18/2019, 11/08/2016 FINDINGS: Lower chest: 14 mm right middle lobe pulmonary nodule near the hilum, series 9, image number  6. Appears slightly larger compared with 2013 chest CT. Large incompletely visualized hiatal hernia containing mesentery, stomach and colon. Small left-sided pleural effusion. The heart size is normal. Hepatobiliary: No focal liver abnormality is seen. Small calcified gallstones. No biliary dilatation Pancreas: Unremarkable. No pancreatic ductal dilatation or surrounding inflammatory changes. Spleen: Normal in size without focal abnormality. Adrenals/Urinary Tract: Adrenal glands are normal. Kidneys show no hydronephrosis. There are bilateral renal cysts. The urinary bladder contains a small  amount of air. Stomach/Bowel: The stomach is nonenlarged. No dilated small bowel. No colon wall thickening. Sigmoid colon diverticular disease without acute inflammatory process. Vascular/Lymphatic: Moderate to marked aortic atherosclerosis without aneurysm. 15 mm left Peri aortic lymph node. Reproductive: Status post hysterectomy. No adnexal masses. Other: Negative for free air or free fluid. Musculoskeletal: Degenerative changes of the spine. Stable 12 mm anterolisthesis L4 on L5. IMPRESSION: 1. No CT evidence for acute intra-abdominal or pelvic abnormality. 2. Incompletely visualized large hiatal hernia. 3. 14 mm right middle lobe pulmonary nodule, appears slightly enlarged as compared with chest CT from 2013. Pulmonary consultation may be considered. 4. Gallstones 5. Sigmoid colon diverticular disease without acute inflammatory change 6. Decreased air within the urinary bladder 7. 15 mm left retroperitoneal node either reactive or neoplastic in etiology. Electronically Signed   By: Donavan Foil M.D.   On: 03/05/2019 18:48   Ct Head Wo Contrast  Result Date: 03/05/2019 CLINICAL DATA:  Altered consciousness. EXAM: CT HEAD WITHOUT CONTRAST TECHNIQUE: Contiguous axial images were obtained from the base of the skull through the vertex without intravenous contrast. COMPARISON:  06/17/2012, report only. FINDINGS: Brain: Expected cerebral and cerebellar volume loss for age. Relatively mild for age low density in the periventricular white matter likely related to small vessel disease. No mass lesion, hemorrhage, hydrocephalus, acute infarct, intra-axial, or extra-axial fluid collection. Vascular: Intracranial atherosclerosis. Skull: No significant soft tissue swelling.  No skull fracture. Sinuses/Orbits: Normal imaged portions of the orbits and globes. Hypoplastic frontal sinuses. Clear mastoid air cells. Other: None. IMPRESSION: 1.  No acute intracranial abnormality. 2.  Cerebral atrophy and small vessel ischemic  change. Electronically Signed   By: Abigail Miyamoto M.D.   On: 03/05/2019 18:21   Dg Chest Portable 1 View  Result Date: 03/05/2019 CLINICAL DATA:  No bowel movement for several days. Increasing fatigue. EXAM: PORTABLE CHEST 1 VIEW COMPARISON:  Radiographs 02/18/2019 and 06/13/2018. CT 10/18/2009. FINDINGS: 1530 hours. The heart size and mediastinal contours are stable. There is aortic atherosclerosis and a large hiatal hernia which has a paraesophageal component. There is chronic left basilar atelectasis adjacent to the hiatal hernia. The right lung is clear. There is no pleural effusion or pneumothorax. No acute osseous findings. Previous left shoulder reverse arthroplasty and distal right clavicle resection. IMPRESSION: Stable appearance of the chest with chronic left basilar atelectasis and large hiatal hernia. No acute cardiopulmonary process. Electronically Signed   By: Richardean Sale M.D.   On: 03/05/2019 16:20   Dg Abd Portable 2 Views  Result Date: 03/05/2019 CLINICAL DATA:  Increased fatigue. No bowel movement for several days. EXAM: PORTABLE ABDOMEN - 2 VIEW COMPARISON:  CT 02/18/2019. Radiographs 06/28/2016. FINDINGS: The bowel gas pattern is nonobstructive. There is no free intraperitoneal air. Pelvic phleboliths and diffuse vascular calcifications are noted. There are degenerative changes throughout the spine without apparent acute osseous findings. IMPRESSION: No acute abdominal findings. Electronically Signed   By: Richardean Sale M.D.   On: 03/05/2019 16:17        Scheduled Meds: .  acetaminophen  650 mg Oral Once  . aspirin EC  81 mg Oral Daily  . [START ON 03/08/2019] clobetasol cream  1 application Topical 2 times weekly  . darifenacin  15 mg Oral Daily  . enoxaparin (LOVENOX) injection  30 mg Subcutaneous QHS  . [START ON 03/07/2019] ketoconazole  1 application Topical 3 times weekly  . levothyroxine  75 mcg Oral QAC breakfast  . loratadine  10 mg Oral Daily  . mirabegron  ER  50 mg Oral Daily  . nystatin  1 Bottle Topical QID  . omega-3 acid ethyl esters  1 g Oral BID  . pantoprazole  40 mg Oral Daily  . simvastatin  40 mg Oral QPM  . triamcinolone   Topical BID  . triamcinolone cream  1 application Topical BID   Continuous Infusions: . sodium chloride Stopped (03/06/19 0115)     LOS: 1 day    Time spent: 35 mins.More than 50% of that time was spent in counseling and/or coordination of care.      Shelly Coss, MD [facility] Pager [phone]  If 7PM-7AM, please contact night-coverage [email or web address] Password [email or web address] 03/06/2019, 2:26 PM

## 2019-03-06 NOTE — Progress Notes (Signed)
  Echocardiogram 2D Echocardiogram has been performed.  Cindy [name] 03/06/2019, 10:34 AM

## 2019-03-06 NOTE — Consult Note (Addendum)
Cardiology Consultation:   Patient ID: Cindy [name]; [id number]; Oct 17, 1922   Admit date: 03/05/2019 Date of Consult: 03/06/2019  Primary Care Provider: Lorne Skeens, MD Primary Cardiologist: Quay Burow, MD 01/16/2019 Primary Electrophysiologist:  None   Patient Profile:   Cindy [name] is a 86 y.o. female with a hx of nl cors 2007 cath, HTN, HLD, GERD, LBBB, CKD III, hypothyroid, depression,, who is being seen today for the evaluation of NSTEMI at the request of Dr Jonelle Sidle.  History of Present Illness:   Cindy [name] is very pleasant and he is oriented to name and place.  However, she does not remember anything about yesterday except that her aide told her she needed to go to the hospital.  She does not remember having any chest pain.  She is not currently experiencing any chest pain.  Although her respiratory rate is a bit elevated sitting in the bed, she denies feeling short of breath.  She seems to be under some stress from the situation, which may be causing her increased respiratory rate.  Information was obtained from chart notes and staff.  Cindy [name] had not had a normal BM in 5 or 6 days.  Yesterday, she was reporting acid reflux and feeling like her food was not going down.  She also reported fatigue, lethargy, weakness, decreased appetite.  P.o. intake reportedly decreased for the last 2 or 3 days.  No melena.  She reported chest pain that lasted for just a few seconds at a time.  Going through to her back.  Later in the ER, she was having problems with dry heaving, but no reports of chest pain.  She is currently resting comfortably, and getting ready to eat breakfast.   Past Medical History:  Diagnosis Date   Arthritis    Chronic renal insufficiency, stage III (moderate)    Depression    Dyslipidemia    Forgetfulness    GERD (gastroesophageal reflux disease)    takes Omeprazole for Hital Hernia    Hiatal hernia    History of nuclear stress test 2005    persantine; inferoapical ischmeia w/ EF 83%; no CAD at cath   [facility] (hard of hearing)    hearing aids   Hypertension    Hypothyroidism    LBBB (left bundle branch block)    chronic per Dr. Gwenlyn Found   Lung nodule 07/01/03   right middle lobe   Macular degeneration    Malignant melanoma ([place]) 11/12/04   L upper arm, MOHS_ right hand, leg   Murmur, cardiac    Osteoporosis    Pneumonia 06/2018    Past Surgical History:  Procedure Laterality Date   BLADDER REPAIR     CARDIAC CATHETERIZATION  2005/2007   normal coronaries    CATARACT EXTRACTION     bilat   coccyx  1951   excision   COLONOSCOPY     knee     right cartilage repair   REVERSE SHOULDER ARTHROPLASTY Left 08/04/2018   Procedure: REVERSE LEFT SHOULDER ARTHROPLASTY;  Surgeon: Netta Cedars, MD;  Location: [facility];  Service: Orthopedics;  Laterality: Left;   ROTATOR CUFF REPAIR     right   TOTAL ABDOMINAL HYSTERECTOMY W/ BILATERAL SALPINGOOPHORECTOMY     TRANSTHORACIC ECHOCARDIOGRAM  2007   EF=>55%; borderline conc LVH; LA mod dilated; RA mild-mod dilated; mild TR; mild AVR with mildly sclerotic AV leavlefts; mild PV regurg      Prior to Admission medications   Medication Sig Start Date End Date Taking? Authorizing  Provider  acetaminophen (TYLENOL) 325 MG tablet Take 1-2 tablets (325-650 mg total) by mouth every 6 (six) hours as needed for mild pain or moderate pain (pain score 1-3 or temp > 100.5). 08/07/18  Yes Netta Cedars, MD  Apoaequorin (PREVAGEN PO) Take 1 tablet by mouth daily.   Yes [provider]  aspirin 81 MG tablet Take 81 mg by mouth daily.   Yes [provider]  cetirizine (ZYRTEC) 10 MG tablet Take 10 mg by mouth daily.   Yes [provider]  clobetasol (TEMOVATE) 0.05 % external solution Apply 1 application topically 2 (two) times a week.  04/21/18  Yes [provider]  CRANBERRY PO Take 1 tablet by mouth daily.    Yes [provider]    fluocinolone (SYNALAR) 0.025 % cream Apply 1 application topically 2 (two) times daily.   Yes [provider]  furosemide (LASIX) 20 MG tablet Take 20 mg by mouth daily.    Yes [provider]  hydrocortisone 2.5 % cream Apply 1 application topically 2 (two) times daily as needed (dry skin).  02/23/18  Yes [provider]  ipratropium (ATROVENT) 0.06 % nasal spray Place 1-2 sprays into both nostrils every 6 (six) hours as needed (rhinorrhea).   Yes [provider]  ketoconazole (NIZORAL) 2 % shampoo Apply 1 application topically 3 (three) times a week.  03/29/18  Yes [provider]  levothyroxine (SYNTHROID) 75 MCG tablet Take 75 mcg by mouth daily before breakfast.  07/01/16  Yes [provider]  mirabegron ER (MYRBETRIQ) 50 MG TB24 tablet Take 1 tablet once a day 03/01/17  Yes [provider]  Multiple Vitamins-Minerals (HAIR SKIN AND NAILS FORMULA PO) Take 1 tablet by mouth 3 (three) times daily.   Yes [provider]  nystatin (NYSTATIN) powder Apply 1 Bottle topically 4 (four) times daily.   Yes [provider]  olmesartan (BENICAR) 40 MG tablet Take 40 mg by mouth daily.  12/26/17  Yes [provider]  Omega-3 1000 MG CAPS Take 1,000 mg by mouth 2 (two) times a day.   Yes [provider]  omega-3 acid ethyl esters (LOVAZA) 1 g capsule Take 1 g by mouth 2 (two) times daily.   Yes [provider]  omeprazole (PRILOSEC) 20 MG capsule Take 20 mg by mouth daily.   Yes [provider]  simvastatin (ZOCOR) 40 MG tablet Take 40 mg by mouth every evening.    Yes [provider]  solifenacin (VESICARE) 10 MG tablet Take 10 mg by mouth daily. 01/26/19  Yes [provider]  traMADol (ULTRAM) 50 MG tablet Take 50 mg by mouth every 8 (eight) hours as needed for moderate pain.  09/12/18  Yes [provider]  triamcinolone cream (KENALOG) 0.1 % Apply 1 application  topically 2 (two) times daily.   Yes [provider]  HYDROcodone-acetaminophen (NORCO/VICODIN) 5-325 MG tablet Take 1 tablet by mouth every 6 (six) hours as needed for severe pain (pain). Patient not taking: Reported on 03/05/2019 08/07/18   Netta Cedars, MD    Inpatient Medications: Scheduled Meds:  acetaminophen  650 mg Oral Once   aspirin EC  81 mg Oral Daily   [START ON 03/08/2019] clobetasol cream  1 application Topical 2 times weekly   darifenacin  15 mg Oral Daily   enoxaparin (LOVENOX) injection  30 mg Subcutaneous QHS   furosemide  20 mg Oral Daily   irbesartan  300 mg Oral Daily   [  START ON 03/07/2019] ketoconazole  1 application Topical 3 times weekly   levothyroxine  75 mcg Oral QAC breakfast   loratadine  10 mg Oral Daily   mirabegron ER  50 mg Oral Daily   nystatin  1 Bottle Topical QID   omega-3 acid ethyl esters  1 g Oral BID   pantoprazole  40 mg Oral Daily   simvastatin  40 mg Oral QPM   triamcinolone   Topical BID   triamcinolone cream  1 application Topical BID   Continuous Infusions:  sodium chloride Stopped (03/06/19 0115)   PRN Meds: acetaminophen, acetaminophen, hydrocortisone cream, ipratropium, ondansetron (ZOFRAN) IV, promethazine, traMADol  Allergies:    Allergies  Allergen Reactions   Fish Allergy Hives   Diclofenac Other (See Comments)   Sulfa Antibiotics     Unknown reaction    Social History:   Social History   Socioeconomic History   Marital status: Widowed    Spouse name: Not on file   Number of children: Not on file   Years of education: Not on file   Highest education level: Not on file  Occupational History   Occupation: Retired  Scientist, product/process development strain: Not on file   Food insecurity    Worry: Not on file    Inability: Not on Lexicographer needs    Medical: Not on file    Non-medical: Not on file  Tobacco Use   Smoking status: Never Smoker   Smokeless  tobacco: Never Used  Substance and Sexual Activity   Alcohol use: No   Drug use: No   Sexual activity: Not on file  Lifestyle   Physical activity    Days per week: Not on file    Minutes per session: Not on file   Stress: Not on file  Relationships   Social connections    Talks on phone: Not on file    Gets together: Not on file    Attends religious service: Not on file    Active member of club or organization: Not on file    Attends meetings of clubs or organizations: Not on file    Relationship status: Not on file   Intimate partner violence    Fear of current or ex partner: Not on file    Emotionally abused: Not on file    Physically abused: Not on file    Forced sexual activity: Not on file  Other Topics Concern   Not on file  Social History Narrative   Has a caregiver daily    Family History:   Family History  Problem Relation Age of Onset   Cancer Father        NHL   Cancer Maternal Grandmother        rectal   Cancer Daughter        breast   Cancer Maternal Aunt        breast   Cancer Son        melanoma   Family Status:  Family Status  Relation Name Status   Mother  Deceased at age 25       heart disease   Father  Deceased at age 54       NHL   Son  Deceased at age 54       hepatitis C   MGM  Deceased at age 16       cancer   MGF  Deceased at age 52  bacterial infection   PGM  Deceased       age   PGF  Deceased       heart problems   Daughter  (Not Specified)   Mat Aunt  (Not Specified)   Son  (Not Specified)    ROS:  Please see the history of present illness.  All other ROS reviewed and negative.     Physical Exam/Data:   Vitals:   03/05/19 2318 03/06/19 0053 03/06/19 0131 03/06/19 0603  BP: 94/66 (!) 91/57 101/60 (!) 91/48  Pulse: (!) 112 60 61 (!) 57  Resp: 20 17 20 20   Temp:   (!) 97.4 F (36.3 C) (!) 97.4 F (36.3 C)  TempSrc:   Oral Oral  SpO2: 95% 93% 99% 100%  Weight:      Height:         Intake/Output Summary (Last 24 hours) at 03/06/2019 0940 Last data filed at 03/06/2019 0600 Gross per 24 hour  Intake 710.34 ml  Output --  Net 710.34 ml   Filed Weights   03/05/19 1047  Weight: 52.6 kg   Body mass index is 21.22 kg/m.  General:  Well nourished, well developed, female in no acute distress HEENT: normal Lymph: no adenopathy Neck: JVD -8-9 cm Endocrine:  No thryomegaly Vascular: No carotid bruits; 4/4 extremity pulses 2+  Cardiac:  normal S1, S2; RRR; 2-3/6 murmur Lungs: Decreased breath sounds bases bilaterally, no wheezing, rhonchi or rales  Abd: soft, nontender, no hepatomegaly  Ext: no edema Musculoskeletal:  No deformities, BUE and BLE strength normal and equal Skin: warm and dry  Neuro:  CNs 2-12 intact, no focal abnormalities noted Psych:  Normal affect   EKG:  The EKG was personally reviewed and demonstrates: 11/23 ECG is sinus bradycardia, heart rate 58, LBBB is old, T wave inversions in V2 are deeper than 11/08 when they were first seen, and there are some minor morphology changes in the shape of the QRS complexes.  She has T wave inversions in V3-6 which are new Telemetry:  Telemetry was personally reviewed and demonstrates:  SR, sinus brady, no sig ectopy   CV studies:   ECHO: ORDERED  ECHO: 09/09/2005 EF > 55%, borderline concentric LVH, impaired relaxation, moderator band in R ventricle, LA mod dilat, RA mild-mod dilated AoV sclerosis (mild), mild AR, mild TR  CATH: 08/30/2005 ANGIOGRAPHIC DATA:  LEFT VENTRICLE:  Left ventricular systolic function was  normal with an ejection fraction of 60-65% with no significant mitral  regurgitation.   RIGHT CORONARY ARTERY:  The right coronary artery is a moderate caliber  vessel that is smooth and normal.   LEFT MAIN:  The left main is short and is normal.   CIRCUMFLEX:  The circumflex is a large caliber vessel. It immediately  bifurcates to the distal circumflex and the large obtuse  marginal one. It is  smooth and normal.   LAD:  The LAD is a large caliber vessel. It gives origin to a small to  moderate size diagonal 1 and a moderate to large diagonal 2. It is smooth  and normal.   IMPRESSION:  1.  Normal left ventricular systolic function, ejection fraction 60-65%.  2.  Normal left ventricular end diastolic pressure.  3.  Normal coronary arteries.  4.  No significant aortic regurgitation to explain angina pectoris by      hemodynamics. The aortic pressures were within normal range and the      diastolic pressure was normal again suggesting that  the aortic      regurgitation if any is not significant.   RECOMMENDATIONS:  Evaluation for noncardiac cause of chest pain is  indicated.   Laboratory Data:   Chemistry Recent Labs  Lab 03/05/19 1202 03/06/19 0758  NA 135 138  K 3.6 3.3*  CL 100 105  CO2 18* 18*  GLUCOSE 116* 76  BUN 95* 89*  CREATININE 2.68* 2.28*  CALCIUM 9.2 8.4*  GFRNONAA 15* 18*  GFRAA 17* 21*  ANIONGAP 17* 15    Lab Results  Component Value Date   ALT 15 03/05/2019   AST 39 03/05/2019   ALKPHOS 45 03/05/2019   BILITOT 1.2 03/05/2019   Hematology Recent Labs  Lab 03/05/19 1202  WBC 7.4  RBC 4.26  HGB 13.5  HCT 41.1  MCV 96.5  MCH 31.7  MCHC 32.8  RDW 13.4  PLT 421*   Cardiac Enzymes High Sensitivity Troponin:   Recent Labs  Lab 02/18/19 1918 03/05/19 1202 03/05/19 1654 03/05/19 1958 03/05/19 2228  TROPONINIHS 35* 439* 352* 413* 396*      BNPNo results for input(s): BNP, PROBNP in the last 168 hours.   TSH: No results found for: TSH Lipids: Lab Results  Component Value Date   CHOL 202 (H) 03/05/2019   HDL 56 03/05/2019   LDLCALC 120 (H) 03/05/2019   TRIG 130 03/05/2019   CHOLHDL 3.6 03/05/2019   HgbA1c:No results found for: HGBA1C Magnesium: No results found for: MG   Radiology/Studies:  Ct Abdomen Pelvis Wo Contrast  Result Date: 03/05/2019 CLINICAL DATA:  Nausea and vomiting EXAM: CT  ABDOMEN AND PELVIS WITHOUT CONTRAST TECHNIQUE: Multidetector CT imaging of the abdomen and pelvis was performed following the standard protocol without IV contrast. COMPARISON:  CT 02/18/2019, 11/08/2016 FINDINGS: Lower chest: 14 mm right middle lobe pulmonary nodule near the hilum, series 9, image number 6. Appears slightly larger compared with 2013 chest CT. Large incompletely visualized hiatal hernia containing mesentery, stomach and colon. Small left-sided pleural effusion. The heart size is normal. Hepatobiliary: No focal liver abnormality is seen. Small calcified gallstones. No biliary dilatation Pancreas: Unremarkable. No pancreatic ductal dilatation or surrounding inflammatory changes. Spleen: Normal in size without focal abnormality. Adrenals/Urinary Tract: Adrenal glands are normal. Kidneys show no hydronephrosis. There are bilateral renal cysts. The urinary bladder contains a small amount of air. Stomach/Bowel: The stomach is nonenlarged. No dilated small bowel. No colon wall thickening. Sigmoid colon diverticular disease without acute inflammatory process. Vascular/Lymphatic: Moderate to marked aortic atherosclerosis without aneurysm. 15 mm left Peri aortic lymph node. Reproductive: Status post hysterectomy. No adnexal masses. Other: Negative for free air or free fluid. Musculoskeletal: Degenerative changes of the spine. Stable 12 mm anterolisthesis L4 on L5. IMPRESSION: 1. No CT evidence for acute intra-abdominal or pelvic abnormality. 2. Incompletely visualized large hiatal hernia. 3. 14 mm right middle lobe pulmonary nodule, appears slightly enlarged as compared with chest CT from 2013. Pulmonary consultation may be considered. 4. Gallstones 5. Sigmoid colon diverticular disease without acute inflammatory change 6. Decreased air within the urinary bladder 7. 15 mm left retroperitoneal node either reactive or neoplastic in etiology. Electronically Signed   By: Donavan Foil M.D.   On: 03/05/2019 18:48    Ct Head Wo Contrast  Result Date: 03/05/2019 CLINICAL DATA:  Altered consciousness. EXAM: CT HEAD WITHOUT CONTRAST TECHNIQUE: Contiguous axial images were obtained from the base of the skull through the vertex without intravenous contrast. COMPARISON:  06/17/2012, report only. FINDINGS: Brain: Expected cerebral and cerebellar volume  loss for age. Relatively mild for age low density in the periventricular white matter likely related to small vessel disease. No mass lesion, hemorrhage, hydrocephalus, acute infarct, intra-axial, or extra-axial fluid collection. Vascular: Intracranial atherosclerosis. Skull: No significant soft tissue swelling.  No skull fracture. Sinuses/Orbits: Normal imaged portions of the orbits and globes. Hypoplastic frontal sinuses. Clear mastoid air cells. Other: None. IMPRESSION: 1.  No acute intracranial abnormality. 2.  Cerebral atrophy and small vessel ischemic change. Electronically Signed   By: Abigail Miyamoto M.D.   On: 03/05/2019 18:21   Dg Chest Portable 1 View  Result Date: 03/05/2019 CLINICAL DATA:  No bowel movement for several days. Increasing fatigue. EXAM: PORTABLE CHEST 1 VIEW COMPARISON:  Radiographs 02/18/2019 and 06/13/2018. CT 10/18/2009. FINDINGS: 1530 hours. The heart size and mediastinal contours are stable. There is aortic atherosclerosis and a large hiatal hernia which has a paraesophageal component. There is chronic left basilar atelectasis adjacent to the hiatal hernia. The right lung is clear. There is no pleural effusion or pneumothorax. No acute osseous findings. Previous left shoulder reverse arthroplasty and distal right clavicle resection. IMPRESSION: Stable appearance of the chest with chronic left basilar atelectasis and large hiatal hernia. No acute cardiopulmonary process. Electronically Signed   By: Richardean Sale M.D.   On: 03/05/2019 16:20   Dg Abd Portable 2 Views  Result Date: 03/05/2019 CLINICAL DATA:  Increased fatigue. No bowel movement  for several days. EXAM: PORTABLE ABDOMEN - 2 VIEW COMPARISON:  CT 02/18/2019. Radiographs 06/28/2016. FINDINGS: The bowel gas pattern is nonobstructive. There is no free intraperitoneal air. Pelvic phleboliths and diffuse vascular calcifications are noted. There are degenerative changes throughout the spine without apparent acute osseous findings. IMPRESSION: No acute abdominal findings. Electronically Signed   By: Richardean Sale M.D.   On: 03/05/2019 16:17    Assessment and Plan:   1. NSTEMI - Pt has elevated ez and has ECG changes of unclear significance in the setting of LBBB - Echo ordered, pending - sx were not exertional, were very brief and resolved w/out intervention - Cr is elevated, precluding cath - continue ASA - No BB 2nd sinus brady - MD advise on changing statin, see below - as pain was very brief and resolved spontaneously, will not add nitrates  2. Hyperlipidemia - pta on Zocor 40 mg qd - lipid profile above, LDL is 120 - MD advise on changing to Lipitor 80 mg  3. HTN - SBP 91-121 since admission - pta on olmesartan 40 mg qd and Lasix 20 mg qd - po intake described as poor, consider holding Lasix  4. SEM - f/u on echo results  5. CKD III - Cr 2.68 on admit, 2.28 today - 07/2018, Cr was 1.81 - per IM  Otherwise, per IM Principal Problem:   NSTEMI (non-ST elevated myocardial infarction) ([place]) Active Problems:   Left bundle branch block   Essential hypertension   Hyperlipidemia   CKD (chronic kidney disease) stage 3, GFR 30-59 ml/min (HCC)   GERD (gastroesophageal reflux disease)   Primary hypothyroidism     For questions or updates, please contact [facility] Please consult [email or web address] for contact info under Cardiology/STEMI.   Signed, Rosaria Ferries, PA-C  03/06/2019 9:40 AM As above, patient seen and examined.  Briefly she is a 85 year old female with past medical history of hypertension, hyperlipidemia, left bundle branch block, chronic  stage III kidney disease, hypothyroidism for evaluation of chest pain.  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  She is a  no CODE BLUE.  She has no recollection of why she is here today though she knows she is in the hospital.  Per her sitter she had indigestion Sunday and yesterday.  She is also been weak and not eating.  She was therefore brought to the emergency room.  Patient denies any chest pain.  She also denies dyspnea.  Troponin elevated and cardiology asked to evaluate. Abdominal CT shows lung nodule.  BUN 89 creatinine 2.28.  Troponin 352, 413, 396.  Electrocardiogram shows sinus rhythm, first-degree AV block, left bundle branch block, worsening anterior T wave inversion.  1 chest pain/elevated troponin-patient has no recollection of chest pain.  Her electrocardiogram is abnormal and troponin elevated suggestive of possible non-ST elevation myocardial infarction but also in the setting of renal insufficiency.  Patient is nearing 85 years old, is a no CODE BLUE and has significant baseline renal insufficiency.  She is not a candidate for aggressive cardiac evaluation including cardiac catheterization.  Continue aspirin and statin.  Await follow-up echocardiogram for medication adjustment.  2 acute on chronic stage IV kidney disease-renal function is worse.  Would hold ARB and Lasix.  She is not volume overloaded on examination and may be a bit dry.  Kirk Ruths, MD

## 2019-03-07 LAB — CBC WITH DIFFERENTIAL/PLATELET
Abs Immature Granulocytes: 0.02 10*3/uL (ref 0.00–0.07)
Basophils Absolute: 0 10*3/uL (ref 0.0–0.1)
Basophils Relative: 1 %
Eosinophils Absolute: 0.4 10*3/uL (ref 0.0–0.5)
Eosinophils Relative: 6 %
HCT: 35.4 % — ABNORMAL LOW (ref 36.0–46.0)
Hemoglobin: 11.1 g/dL — ABNORMAL LOW (ref 12.0–15.0)
Immature Granulocytes: 0 %
Lymphocytes Relative: 20 %
Lymphs Abs: 1.3 10*3/uL (ref 0.7–4.0)
MCH: 31.3 pg (ref 26.0–34.0)
MCHC: 31.4 g/dL (ref 30.0–36.0)
MCV: 99.7 fL (ref 80.0–100.0)
Monocytes Absolute: 1 10*3/uL (ref 0.1–1.0)
Monocytes Relative: 15 %
Neutro Abs: 3.8 10*3/uL (ref 1.7–7.7)
Neutrophils Relative %: 58 %
Platelets: 321 10*3/uL (ref 150–400)
RBC: 3.55 MIL/uL — ABNORMAL LOW (ref 3.87–5.11)
RDW: 13.8 % (ref 11.5–15.5)
WBC: 6.5 10*3/uL (ref 4.0–10.5)
nRBC: 0 % (ref 0.0–0.2)

## 2019-03-07 LAB — BASIC METABOLIC PANEL
Anion gap: 11 (ref 5–15)
BUN: 74 mg/dL — ABNORMAL HIGH (ref 8–23)
CO2: 20 mmol/L — ABNORMAL LOW (ref 22–32)
Calcium: 8.1 mg/dL — ABNORMAL LOW (ref 8.9–10.3)
Chloride: 108 mmol/L (ref 98–111)
Creatinine, Ser: 2.18 mg/dL — ABNORMAL HIGH (ref 0.44–1.00)
GFR calc Af Amer: 22 mL/min — ABNORMAL LOW (ref >60)
GFR calc non Af Amer: 19 mL/min — ABNORMAL LOW (ref >60)
Glucose, Bld: 83 mg/dL (ref 70–99)
Potassium: 3 mmol/L — ABNORMAL LOW (ref 3.5–5.1)
Sodium: 139 mmol/L (ref 135–145)

## 2019-03-07 MED ORDER — POLYETHYLENE GLYCOL 3350 17 G PO PACK: 17.0000 g | PACK | Freq: Every day | ORAL | 0 refills | Status: DC

## 2019-03-07 MED ORDER — POTASSIUM CHLORIDE CRYS ER 20 MEQ PO TBCR
40.0000 meq | EXTENDED_RELEASE_TABLET | ORAL | Status: AC
  Administered 2019-03-07 (×2): 40 meq via ORAL
  Filled 2019-03-07 (×2): qty 2

## 2019-03-07 MED ORDER — POTASSIUM CHLORIDE ER 20 MEQ PO TBCR: 20.0000 meq | EXTENDED_RELEASE_TABLET | Freq: Every day | ORAL | 0 refills | Status: DC

## 2019-03-07 NOTE — Care Management Important Message (Signed)
Important Message  Patient Details IM Letter given to Rhea Pink SW to present to the Patient Name: Cindy [name] MRN: [id number] Date of Birth: 1922/06/07   Medicare Important Message Given:  Yes     Kerin Salen 03/07/2019, 11:53 AM

## 2019-03-07 NOTE — Evaluation (Signed)
Occupational Therapy Evaluation Patient Details Name: Cindy [name] MRN: [id number] DOB: January 30, 1923 Today's Date: 03/07/2019    History of Present Illness 85 year old female with history of hypertension, chronic kidney disease stage III, hyperlipidemia, GERD, hypothyroidism, depression who presented to the emergency department with complaints of GERD symptoms, nausea, constipation. Dx of NSTEMI.   Clinical Impression   Pt was admitted for the above. Her caregiver was present during the evaluation, and he assists her with adls at home.  Pt needs more assistance currently.  Caregiver said that the PT at the agency they used last time can recommend and obtain OT services.  Plan is for d/c home today    Follow Up Recommendations  Home health OT;Supervision/Assistance - 24 hour    Equipment Recommendations  None recommended by OT    Recommendations for Other Services       Precautions / Restrictions Precautions Precautions: Fall Precaution Comments: CG reports pt had a fall in April or May resulting in shoulder injury, s/p reverse TSA; no falls since then Restrictions Weight Bearing Restrictions: No      Mobility Bed Mobility Overal bed mobility: Needs Assistance Bed Mobility: Supine to Sit     Supine to sit: Min assist     General bed mobility comments: oob  Transfers Overall transfer level: Needs assistance Equipment used: 1 person hand held assist Transfers: Sit to/from Stand Sit to Stand: Mod assist         General transfer comment: assist to rise and stabilize    Balance Overall balance assessment: Needs assistance Sitting-balance support: Feet supported Sitting balance-Leahy Scale: Good     Standing balance support: Bilateral upper extremity supported Standing balance-Leahy Scale: Poor                             ADL either performed or assessed with clinical judgement   ADL Overall ADL's : Needs assistance/impaired     Grooming: Minimal  assistance;Sitting                   Toilet Transfer: Moderate assistance;BSC   Toileting- Clothing Manipulation and Hygiene: Total assistance;Sitting/lateral lean         General ADL Comments: pt used 3:1 commode and sat for teeth due to fatique.  Based on clinical judgment, she needs mod A for UB adls and total A for LB adls due to fatique.  She has assistance at baseline, but needs more now. She doesn't use AD but has them at home     Vision         Perception     Praxis      Pertinent Vitals/Pain Pain Assessment: No/denies pain     Hand Dominance     Extremity/Trunk Assessment Upper Extremity Assessment Upper Extremity Assessment: Generalized weakness   Lower Extremity Assessment Lower Extremity Assessment: Overall WFL for tasks assessed   Cervical / Trunk Assessment Cervical / Trunk Assessment: Kyphotic   Communication Communication Communication: No difficulties   Cognition Arousal/Alertness: Awake/alert Behavior During Therapy: WFL for tasks assessed/performed Overall Cognitive Status: Within Functional Limits for tasks assessed                                     General Comments       Exercises     Shoulder Instructions      Home Living Family/patient expects to be discharged  to:: Private residence Living Arrangements: Alone Available Help at Discharge: Personal care attendant;Available 24 hours/day Type of Home: House Home Access: Ramped entrance     Home Layout: One level     Bathroom Shower/Tub: Occupational psychologist: Standard     Home Equipment: Bedside commode;Shower seat          Prior Functioning/Environment Level of Independence: Needs assistance  Gait / Transfers Assistance Needed: supervision for ambulation ADL's / Homemaking Assistance Needed: assist for bathing/dressing            OT Problem List:        OT Treatment/Interventions:      OT Goals(Current goals can be found in  the care plan section) Acute Rehab OT Goals Patient Stated Goal: DC home OT Goal Formulation: All assessment and education complete, DC therapy  OT Frequency:     Barriers to D/C:            Co-evaluation              AM-PAC OT "6 Clicks" Daily Activity     Outcome Measure Help from another person eating meals?: A Little Help from another person taking care of personal grooming?: A Little Help from another person toileting, which includes using toliet, bedpan, or urinal?: Total Help from another person bathing (including washing, rinsing, drying)?: A Lot Help from another person to put on and taking off regular upper body clothing?: A Lot Help from another person to put on and taking off regular lower body clothing?: Total 6 Click Score: 12   End of Session    Activity Tolerance: Patient limited by fatigue Patient left: in chair;with call bell/phone within reach;with family/visitor present  OT Visit Diagnosis: Muscle weakness (generalized) (M62.81)                Time: 0034-9179 OT Time Calculation (min): 21 min Charges:  OT General Charges $OT Visit: 1 Visit OT Evaluation $OT Eval Low Complexity: 1 Low  Lesle Chris, OTR/L Acute Rehabilitation Services [phone] [facility] pager [phone] office 03/07/2019  [place] 03/07/2019, 11:58 AM

## 2019-03-07 NOTE — Discharge Summary (Signed)
Physician Discharge Summary  Cindy [name] ZOX:[id number] DOB: 06/07/1922 DOA: 03/05/2019  PCP: Lorne Skeens, MD  Admit date: 03/05/2019 Discharge date: 03/07/2019  Admitted From: Home Disposition:  Home  Discharge Condition:Stable CODE STATUS:FULL Diet recommendation: Heart Healthy    Brief/Interim Summary: Patient is a 85 year old female with history of hypertension, chronic kidney disease stage III, hyperlipidemia, GERD, hypothyroidism, depression who presented to the emergency department with complaints of GERD symptoms, nausea, constipation.  She was complaining of epigastric pain.  She denied any shortness of breath or cough.  EKG was positive for ischemic changes.  Cardiac enzymes were elevated.  She was admitted for the management of possible  NSTEMI.  Cardiology consulted.  Not on heparin drip due to advanced age.  Cardiology do not plan for any intervention.  Echocardiogram showed normal ejection fraction, no wall motion abnormality.  Currently she is chest pain-free.  Her troponin elevation was most likely secondary to renal insufficiency.  She remains hemodynamically stable.  Physical therapy evaluated her and recommended home health PT.  She is medically stable for discharge home today.  Following problems were addressed during hospitalization:  Suspected NSTEMI: Presented with GERD symptoms, had EKG changes.  Elevated troponin.  Not started  on heparin drip.  cardiology was following.  Echocardiogram shows preserved ejection fraction of 81-19%, grade 2 diastolic dysfunction, no wall motion abnormality.  This morning she is chest pain-free.  Denies any shortness of breath.  Hemodynamically stable.    Continue aspirin. No significant  need for invasive cardiac evaluation.  Cardiology recommended to continue aspirin and statin.  Hypertension: Currently blood pressure stable.  Continue home regimen  CKD stage III: Creatinine on 4/20 was 1.8.  Presented with creatinine of 2.6.  Mild improvement with IV fluids.  Check BMP in a week  GERD: Continue PPI  Hyperlipidemia: Continue Lipitor.  Hypothyroidism: Continue Synthyroid  Hypokalemia: Supplemented with potassium  Constipation: Continue bowel regimen.  Debility/deconditioning: Patient lives alone but has home aide.  Her son lives in [place].  Patient states she can ambulate with the help of walker.  She also drives sometimes.PT recommended [place]   Discharge Diagnoses:  Principal Problem:   NSTEMI (non-ST elevated myocardial infarction) ([place]) Active Problems:   Left bundle branch block   Essential hypertension   Hyperlipidemia   CKD (chronic kidney disease) stage 3, GFR 30-59 ml/min (HCC)   GERD (gastroesophageal reflux disease)   Primary hypothyroidism    Discharge Instructions  Discharge Instructions    Diet - low sodium heart healthy   Complete by: As directed    Discharge instructions   Complete by: As directed    1)Follow up with your PCP in a week. Do a CBC and BMP during the follow up. 2)Follow up with your cardiologist as an outpatient.   Increase activity slowly   Complete by: As directed      Allergies as of 03/07/2019      Reactions   Fish Allergy Hives   Diclofenac Other (See Comments)   Sulfa Antibiotics    Unknown reaction      Medication List    STOP taking these medications   furosemide 20 MG tablet Commonly known as: LASIX   olmesartan 40 MG tablet Commonly known as: BENICAR     TAKE these medications   acetaminophen 325 MG tablet Commonly known as: TYLENOL Take 1-2 tablets (325-650 mg total) by mouth every 6 (six) hours as needed for mild pain or moderate pain (pain score 1-3 or temp > 100.5).  aspirin 81 MG tablet Take 81 mg by mouth daily.   cetirizine 10 MG tablet Commonly known as: ZYRTEC Take 10 mg by mouth daily.   clobetasol 0.05 % external solution Commonly known as: TEMOVATE Apply 1 application topically 2 (two) times a week.   CRANBERRY  PO Take 1 tablet by mouth daily.   fluocinolone 0.025 % cream Commonly known as: SYNALAR Apply 1 application topically 2 (two) times daily.   HAIR SKIN AND NAILS FORMULA PO Take 1 tablet by mouth 3 (three) times daily.   HYDROcodone-acetaminophen 5-325 MG tablet Commonly known as: NORCO/VICODIN Take 1 tablet by mouth every 6 (six) hours as needed for severe pain (pain).   hydrocortisone 2.5 % cream Apply 1 application topically 2 (two) times daily as needed (dry skin).   ipratropium 0.06 % nasal spray Commonly known as: ATROVENT Place 1-2 sprays into both nostrils every 6 (six) hours as needed (rhinorrhea).   ketoconazole 2 % shampoo Commonly known as: NIZORAL Apply 1 application topically 3 (three) times a week.   levothyroxine 75 MCG tablet Commonly known as: SYNTHROID Take 75 mcg by mouth daily before breakfast.   Myrbetriq 50 MG Tb24 tablet Generic drug: mirabegron ER Take 1 tablet once a day   nystatin powder Generic drug: nystatin Apply 1 Bottle topically 4 (four) times daily.   Omega-3 1000 MG Caps Take 1,000 mg by mouth 2 (two) times a day.   omega-3 acid ethyl esters 1 g capsule Commonly known as: LOVAZA Take 1 g by mouth 2 (two) times daily.   omeprazole 20 MG capsule Commonly known as: PRILOSEC Take 20 mg by mouth daily.   polyethylene glycol 17 g packet Commonly known as: MiraLax Take 17 g by mouth daily.   Potassium Chloride ER 20 MEQ Tbcr Take 20 mEq by mouth daily. Start taking on: March 08, 2019   PREVAGEN PO Take 1 tablet by mouth daily.   simvastatin 40 MG tablet Commonly known as: ZOCOR Take 40 mg by mouth every evening.   solifenacin 10 MG tablet Commonly known as: VESICARE Take 10 mg by mouth daily.   traMADol 50 MG tablet Commonly known as: ULTRAM Take 50 mg by mouth every 8 (eight) hours as needed for moderate pain.   triamcinolone cream 0.1 % Commonly known as: KENALOG Apply 1 application topically 2 (two) times  daily.      Follow-up Information    Lorretta Harp, MD Follow up.   Specialties: Cardiology, Radiology Why: Cardiology follow up on 06/08/2019 at 10:15. Please arrive 15 minutes early for check in.  Contact information: [address] [place] [address] [phone]        Altheimer, Legrand Como, MD. Schedule an appointment as soon as possible for a visit in 1 week(s).   Specialty: Endocrinology Contact information: [place] [address] [phone]          Allergies  Allergen Reactions  . Fish Allergy Hives  . Diclofenac Other (See Comments)  . Sulfa Antibiotics     Unknown reaction    Consultations: Cardiology  Procedures/Studies: Ct Abdomen Pelvis Wo Contrast  Result Date: 03/05/2019 CLINICAL DATA:  Nausea and vomiting EXAM: CT ABDOMEN AND PELVIS WITHOUT CONTRAST TECHNIQUE: Multidetector CT imaging of the abdomen and pelvis was performed following the standard protocol without IV contrast. COMPARISON:  CT 02/18/2019, 11/08/2016 FINDINGS: Lower chest: 14 mm right middle lobe pulmonary nodule near the hilum, series 9, image number 6. Appears slightly larger compared with  2013 chest CT. Large incompletely visualized hiatal hernia containing mesentery, stomach and colon. Small left-sided pleural effusion. The heart size is normal. Hepatobiliary: No focal liver abnormality is seen. Small calcified gallstones. No biliary dilatation Pancreas: Unremarkable. No pancreatic ductal dilatation or surrounding inflammatory changes. Spleen: Normal in size without focal abnormality. Adrenals/Urinary Tract: Adrenal glands are normal. Kidneys show no hydronephrosis. There are bilateral renal cysts. The urinary bladder contains a small amount of air. Stomach/Bowel: The stomach is nonenlarged. No dilated small bowel. No colon wall thickening. Sigmoid colon diverticular disease without acute inflammatory process. Vascular/Lymphatic: Moderate to marked  aortic atherosclerosis without aneurysm. 15 mm left Peri aortic lymph node. Reproductive: Status post hysterectomy. No adnexal masses. Other: Negative for free air or free fluid. Musculoskeletal: Degenerative changes of the spine. Stable 12 mm anterolisthesis L4 on L5. IMPRESSION: 1. No CT evidence for acute intra-abdominal or pelvic abnormality. 2. Incompletely visualized large hiatal hernia. 3. 14 mm right middle lobe pulmonary nodule, appears slightly enlarged as compared with chest CT from 2013. Pulmonary consultation may be considered. 4. Gallstones 5. Sigmoid colon diverticular disease without acute inflammatory change 6. Decreased air within the urinary bladder 7. 15 mm left retroperitoneal node either reactive or neoplastic in etiology. Electronically Signed   By: Donavan Foil M.D.   On: 03/05/2019 18:48   Ct Head Wo Contrast  Result Date: 03/05/2019 CLINICAL DATA:  Altered consciousness. EXAM: CT HEAD WITHOUT CONTRAST TECHNIQUE: Contiguous axial images were obtained from the base of the skull through the vertex without intravenous contrast. COMPARISON:  06/17/2012, report only. FINDINGS: Brain: Expected cerebral and cerebellar volume loss for age. Relatively mild for age low density in the periventricular white matter likely related to small vessel disease. No mass lesion, hemorrhage, hydrocephalus, acute infarct, intra-axial, or extra-axial fluid collection. Vascular: Intracranial atherosclerosis. Skull: No significant soft tissue swelling.  No skull fracture. Sinuses/Orbits: Normal imaged portions of the orbits and globes. Hypoplastic frontal sinuses. Clear mastoid air cells. Other: None. IMPRESSION: 1.  No acute intracranial abnormality. 2.  Cerebral atrophy and small vessel ischemic change. Electronically Signed   By: Abigail Miyamoto M.D.   On: 03/05/2019 18:21   Dg Chest Portable 1 View  Result Date: 03/05/2019 CLINICAL DATA:  No bowel movement for several days. Increasing fatigue. EXAM:  PORTABLE CHEST 1 VIEW COMPARISON:  Radiographs 02/18/2019 and 06/13/2018. CT 10/18/2009. FINDINGS: 1530 hours. The heart size and mediastinal contours are stable. There is aortic atherosclerosis and a large hiatal hernia which has a paraesophageal component. There is chronic left basilar atelectasis adjacent to the hiatal hernia. The right lung is clear. There is no pleural effusion or pneumothorax. No acute osseous findings. Previous left shoulder reverse arthroplasty and distal right clavicle resection. IMPRESSION: Stable appearance of the chest with chronic left basilar atelectasis and large hiatal hernia. No acute cardiopulmonary process. Electronically Signed   By: Richardean Sale M.D.   On: 03/05/2019 16:20   Dg Chest Port 1 View  Result Date: 02/18/2019 CLINICAL DATA:  Shortness of breath, right flank pain EXAM: PORTABLE CHEST 1 VIEW COMPARISON:  06/13/2018 FINDINGS: Large hiatal hernia obscuring the left lower hemithorax. Right lung is clear. No pleural effusion or pneumothorax. Heart is normal in size.  Thoracic aortic atherosclerosis. Left shoulder arthroplasty. Degenerative changes of the right shoulder with suspected chronic rotator cuff tear. IMPRESSION: No evidence of acute cardiopulmonary disease. Large hiatal hernia. Electronically Signed   By: Julian Hy M.D.   On: 02/18/2019 18:08   Dg Abd Portable 2 Views  Result Date: 03/05/2019 CLINICAL DATA:  Increased fatigue. No bowel movement for several days. EXAM: PORTABLE ABDOMEN - 2 VIEW COMPARISON:  CT 02/18/2019. Radiographs 06/28/2016. FINDINGS: The bowel gas pattern is nonobstructive. There is no free intraperitoneal air. Pelvic phleboliths and diffuse vascular calcifications are noted. There are degenerative changes throughout the spine without apparent acute osseous findings. IMPRESSION: No acute abdominal findings. Electronically Signed   By: Richardean Sale M.D.   On: 03/05/2019 16:17   Ct Renal Stone Study  Result Date:  02/18/2019 CLINICAL DATA:  Right flank pain, recently treated for UTI EXAM: CT ABDOMEN AND PELVIS WITHOUT CONTRAST TECHNIQUE: Multidetector CT imaging of the abdomen and pelvis was performed following the standard protocol without IV contrast. COMPARISON:  11/08/2016 FINDINGS: Lower chest: Elevation of the left hemidiaphragm/hiatal hernia. Right lung is essentially clear, noting scarring/bronchiectasis in the medial right lower lobe. Hepatobiliary: Unenhanced liver is unremarkable. Tiny layering gallstone with layering gallbladder sludge (series 2/image 23). No associated inflammatory changes. No intrahepatic or extrahepatic ductal dilatation. Pancreas: Within normal limits. Spleen: Within normal limits. Adrenals/Urinary Tract: Adrenal glands are within normal limits. Bilateral renal cysts, measuring up to 4.2 cm in the anterior left upper kidney (series 2/image 23). No hydronephrosis. Nondependent gas in the bladder. Stomach/Bowel: Large hiatal hernia, incompletely visualized. No evidence of bowel obstruction. Appendix is not discretely visualized. Extensive left colonic diverticulosis, without evidence of diverticulitis. Vascular/Lymphatic: No evidence of abdominal aortic aneurysm. Atherosclerotic calcifications of the abdominal aorta and branch vessels. No suspicious abdominopelvic lymphadenopathy. Reproductive: Status post hysterectomy. No adnexal masses. Other: No abdominopelvic ascites. Musculoskeletal: Degenerative changes of the visualized thoracolumbar spine. Grade 1 anterolisthesis of L4 on L5. IMPRESSION: Nondependent gas in the bladder, correlate for cystitis. Extensive left colonic diverticulosis, without evidence of diverticulitis. No evidence of bowel obstruction. Cholelithiasis, without associated inflammatory changes. Large hiatal hernia, incompletely visualized. Electronically Signed   By: Julian Hy M.D.   On: 02/18/2019 18:57      Subjective: Patient seen and examined the bedside  this morning.  Hemodynamically stable.  Denies any chest pain or shortness of breath.  Medically stable for discharge.  I talked to the home health aide.  Discharge Exam: Vitals:   03/06/19 2339 03/07/19 0607  BP: (!) 101/52 (!) 111/57  Pulse: 64 70  Resp: 18 20  Temp: 97.7 F (36.5 C) 97.7 F (36.5 C)  SpO2: 95%    Vitals:   03/06/19 1341 03/06/19 1742 03/06/19 2339 03/07/19 0607  BP: (!) 99/54  (!) 101/52 (!) 111/57  Pulse: (!) 57  64 70  Resp: 16  18 20   Temp: (!) 97.4 F (36.3 C) 98 F (36.7 C) 97.7 F (36.5 C) 97.7 F (36.5 C)  TempSrc: Oral  Oral Oral  SpO2: 90%  95%   Weight:      Height:        General: Pt is alert, awake, not in acute distress Cardiovascular: RRR, S1/S2 +, no rubs, no gallops Respiratory: CTA bilaterally, no wheezing, no rhonchi Abdominal: Soft, NT, ND, bowel sounds + Extremities: no edema, no cyanosis    The results of significant diagnostics from this hospitalization (including imaging, microbiology, ancillary and laboratory) are listed below for reference.     Microbiology: Recent Results (from the past 240 hour(s))  SARS CORONAVIRUS 2 (TAT 6-24 HRS) Nasopharyngeal Nasopharyngeal Swab     Status: None   Collection Time: 03/05/19  3:43 PM   Specimen: Nasopharyngeal Swab  Result Value Ref Range Status   SARS Coronavirus 2 NEGATIVE  NEGATIVE Final    Comment: (NOTE) SARS-CoV-2 target nucleic acids are NOT DETECTED. The SARS-CoV-2 RNA is generally detectable in upper and lower respiratory specimens during the acute phase of infection. Negative results do not preclude SARS-CoV-2 infection, do not rule out co-infections with other pathogens, and should not be used as the sole basis for treatment or other patient management decisions. Negative results must be combined with clinical observations, patient history, and epidemiological information. The expected result is Negative. Fact Sheet for  Patients: SugarRoll.be Fact Sheet for Healthcare Providers: https://www.woods-mathews.com/ This test is not yet approved or cleared by the [place] FDA and  has been authorized for detection and/or diagnosis of SARS-CoV-2 by FDA under an Emergency Use Authorization (EUA). This EUA will remain  in effect (meaning this test can be used) for the duration of the COVID-19 declaration under Section 56 4(b)(1) of the Act, 21 U.S.C. section 360bbb-3(b)(1), unless the authorization is terminated or revoked sooner. Performed at [facility], [place] [address], [place], [place] [address]      Labs: BNP (last 3 results) No results for input(s): BNP in the last 8760 hours. Basic Metabolic Panel: Recent Labs  Lab 03/05/19 1202 03/06/19 0758 03/07/19 0519  NA 135 138 139  K 3.6 3.3* 3.0*  CL 100 105 108  CO2 18* 18* 20*  GLUCOSE 116* 76 83  BUN 95* 89* 74*  CREATININE 2.68* 2.28* 2.18*  CALCIUM 9.2 8.4* 8.1*   Liver Function Tests: Recent Labs  Lab 03/05/19 1202  AST 39  ALT 15  ALKPHOS 45  BILITOT 1.2  PROT 7.3  ALBUMIN 3.4*   Recent Labs  Lab 03/05/19 1202  LIPASE 51   No results for input(s): AMMONIA in the last 168 hours. CBC: Recent Labs  Lab 03/05/19 1202 03/07/19 0519  WBC 7.4 6.5  NEUTROABS  --  3.8  HGB 13.5 11.1*  HCT 41.1 35.4*  MCV 96.5 99.7  PLT 421* 321   Cardiac Enzymes: No results for input(s): CKTOTAL, CKMB, CKMBINDEX, TROPONINI in the last 168 hours. BNP: Invalid input(s): POCBNP CBG: No results for input(s): GLUCAP in the last 168 hours. D-Dimer No results for input(s): DDIMER in the last 72 hours. Hgb A1c Recent Labs    03/06/19 0758  HGBA1C 6.0*   Lipid Profile Recent Labs    03/05/19 1958  CHOL 202*  HDL 56  LDLCALC 120*  TRIG 130  CHOLHDL 3.6   Thyroid function studies No results for input(s): TSH, T4TOTAL, T3FREE, THYROIDAB in the last 72 hours.  Invalid input(s):  FREET3 Anemia work up No results for input(s): VITAMINB12, FOLATE, FERRITIN, TIBC, IRON, RETICCTPCT in the last 72 hours. Urinalysis    Component Value Date/Time   COLORURINE YELLOW 03/05/2019 1051   APPEARANCEUR HAZY (A) 03/05/2019 1051   LABSPEC 1.012 03/05/2019 1051   PHURINE 5.0 03/05/2019 1051   GLUCOSEU NEGATIVE 03/05/2019 1051   HGBUR NEGATIVE 03/05/2019 1051   BILIRUBINUR NEGATIVE 03/05/2019 1051   KETONESUR 5 (A) 03/05/2019 1051   PROTEINUR NEGATIVE 03/05/2019 1051   UROBILINOGEN 0.2 11/22/2013 2218   NITRITE NEGATIVE 03/05/2019 1051   LEUKOCYTESUR NEGATIVE 03/05/2019 1051   Sepsis Labs Invalid input(s): PROCALCITONIN,  WBC,  LACTICIDVEN Microbiology Recent Results (from the past 240 hour(s))  SARS CORONAVIRUS 2 (TAT 6-24 HRS) Nasopharyngeal Nasopharyngeal Swab     Status: None   Collection Time: 03/05/19  3:43 PM   Specimen: Nasopharyngeal Swab  Result Value Ref Range Status   SARS Coronavirus 2 NEGATIVE  NEGATIVE Final    Comment: (NOTE) SARS-CoV-2 target nucleic acids are NOT DETECTED. The SARS-CoV-2 RNA is generally detectable in upper and lower respiratory specimens during the acute phase of infection. Negative results do not preclude SARS-CoV-2 infection, do not rule out co-infections with other pathogens, and should not be used as the sole basis for treatment or other patient management decisions. Negative results must be combined with clinical observations, patient history, and epidemiological information. The expected result is Negative. Fact Sheet for Patients: SugarRoll.be Fact Sheet for Healthcare Providers: https://www.woods-mathews.com/ This test is not yet approved or cleared by the [place] FDA and  has been authorized for detection and/or diagnosis of SARS-CoV-2 by FDA under an Emergency Use Authorization (EUA). This EUA will remain  in effect (meaning this test can be used) for the duration of  the COVID-19 declaration under Section 56 4(b)(1) of the Act, 21 U.S.C. section 360bbb-3(b)(1), unless the authorization is terminated or revoked sooner. Performed at [facility], [place] [address] [place], [place] [address]     Please note: You were cared for by a hospitalist during your hospital stay. Once you are discharged, your primary care physician will handle any further medical issues. Please note that NO REFILLS for any discharge medications will be authorized once you are discharged, as it is imperative that you return to your primary care physician (or establish a relationship with a primary care physician if you do not have one) for your post hospital discharge needs so that they can reassess your need for medications and monitor your lab values.    Time coordinating discharge: 40 minutes  SIGNED:   Shelly Coss, MD  [facility] 03/07/2019, 11:24 AM Pager [phone]  If 7PM-7AM, please contact night-coverage [email or web address] Password [email or web address]

## 2019-03-07 NOTE — Evaluation (Signed)
Physical Therapy Evaluation Patient Details Name: Cindy [name] MRN: [id number] DOB: 1922/09/19 Today's Date: 03/07/2019   History of Present Illness  85 year old female with history of hypertension, chronic kidney disease stage III, hyperlipidemia, GERD, hypothyroidism, depression who presented to the emergency department with complaints of GERD symptoms, nausea, constipation. Dx of NSTEMI.  Clinical Impression  Pt admitted with above diagnosis. Min A for bed mobility, Mod A to take a few pivotal steps to recliner with RW. Ambulation deferred 2* fatigue with transfer. Pt has 24* private caregiver who stated he feels he can manage her care in her current functional level. He was present for PT eval. HHPT recommended.  Pt currently with functional limitations due to the deficits listed below (see PT Problem List). Pt will benefit from skilled PT to increase their independence and safety with mobility to allow discharge to the venue listed below.       Follow Up Recommendations Home health PT;Supervision for mobility/OOB;Supervision/Assistance - 24 hour    Equipment Recommendations  None recommended by PT    Recommendations for Other Services       Precautions / Restrictions Precautions Precautions: Fall Precaution Comments: CG reports pt had a fall in April or May resulting in shoulder injury, s/p reverse TSA; no falls since then Restrictions Weight Bearing Restrictions: No      Mobility  Bed Mobility Overal bed mobility: Needs Assistance Bed Mobility: Supine to Sit     Supine to sit: Min assist     General bed mobility comments: min A to raise trunk  Transfers Overall transfer level: Needs assistance Equipment used: Rolling walker (2 wheeled) Transfers: Sit to/from Stand Sit to Stand: Mod assist         General transfer comment: assist to rise/steady  Ambulation/Gait Ambulation/Gait assistance: Min assist Gait Distance (Feet): 3 Feet Assistive device: Rolling  walker (2 wheeled) Gait Pattern/deviations: Step-to pattern Gait velocity: decr   General Gait Details: pivotal steps from bed to recliner, no further ambulation attempted 2* pt fatigue, HR 77 with activity  Stairs            Wheelchair Mobility    Modified Rankin (Stroke Patients Only)       Balance Overall balance assessment: Needs assistance Sitting-balance support: Feet supported Sitting balance-Leahy Scale: Good     Standing balance support: Bilateral upper extremity supported Standing balance-Leahy Scale: Poor                               Pertinent Vitals/Pain Pain Assessment: No/denies pain    Home Living Family/patient expects to be discharged to:: Private residence Living Arrangements: Alone Available Help at Discharge: Personal care attendant;Available 24 hours/day Type of Home: House Home Access: Ramped entrance     Home Layout: One level Home Equipment: Walker - 2 wheels;Wheelchair - manual      Prior Function Level of Independence: Needs assistance   Gait / Transfers Assistance Needed: supervision for ambulation  ADL's / Homemaking Assistance Needed: assist for bathing/dressing        Hand Dominance        Extremity/Trunk Assessment   Upper Extremity Assessment Upper Extremity Assessment: Defer to OT evaluation    Lower Extremity Assessment Lower Extremity Assessment: Overall WFL for tasks assessed    Cervical / Trunk Assessment Cervical / Trunk Assessment: Kyphotic  Communication   Communication: No difficulties  Cognition Arousal/Alertness: Awake/alert Behavior During Therapy: WFL for tasks assessed/performed Overall Cognitive Status: Within  Functional Limits for tasks assessed                                        General Comments      Exercises     Assessment/Plan    PT Assessment Patient needs continued PT services  PT Problem List Decreased activity tolerance;Decreased  balance;Decreased mobility       PT Treatment Interventions Gait training;DME instruction;Functional mobility training;Therapeutic activities;Therapeutic exercise;Patient/family education    PT Goals (Current goals can be found in the Care Plan section)  Acute Rehab PT Goals Patient Stated Goal: DC home PT Goal Formulation: With patient Time For Goal Achievement: 03/22/19 Potential to Achieve Goals: Fair    Frequency Min 3X/week   Barriers to discharge        Co-evaluation               AM-PAC PT "6 Clicks" Mobility  Outcome Measure Help needed turning from your back to your side while in a flat bed without using bedrails?: A Little Help needed moving from lying on your back to sitting on the side of a flat bed without using bedrails?: A Lot Help needed moving to and from a bed to a chair (including a wheelchair)?: A Little Help needed standing up from a chair using your arms (e.g., wheelchair or bedside chair)?: A Lot Help needed to walk in hospital room?: A Lot Help needed climbing 3-5 steps with a railing? : A Lot 6 Click Score: 14    End of Session Equipment Utilized During Treatment: Gait belt Activity Tolerance: Patient limited by fatigue Patient left: in chair;with call bell/phone within reach;with family/visitor present Nurse Communication: Mobility status PT Visit Diagnosis: Difficulty in walking, not elsewhere classified (R26.2)    Time: 1027-1050 PT Time Calculation (min) (ACUTE ONLY): 23 min   Charges:   PT Evaluation $PT Eval Low Complexity: 1 Low PT Treatments $Therapeutic Activity: 8-22 mins       Blondell Reveal Kistler PT 03/07/2019  Acute Rehabilitation Services Pager [phone] Office [phone]

## 2019-03-07 NOTE — Progress Notes (Addendum)
Progress Note  Patient Name: Cindy [name] Date of Encounter: 03/07/2019  Primary Cardiologist: Quay Burow, MD   Subjective   Pt denies CP or dyspnea  Inpatient Medications    Scheduled Meds:  acetaminophen  650 mg Oral Once   aspirin EC  81 mg Oral Daily   [START ON 03/08/2019] clobetasol cream  1 application Topical 2 times weekly   darifenacin  15 mg Oral Daily   enoxaparin (LOVENOX) injection  30 mg Subcutaneous QHS   ketoconazole  1 application Topical 3 times weekly   levothyroxine  75 mcg Oral QAC breakfast   loratadine  10 mg Oral Daily   mirabegron ER  50 mg Oral Daily   nystatin  1 Bottle Topical QID   omega-3 acid ethyl esters  1 g Oral BID   pantoprazole  40 mg Oral Daily   potassium chloride  40 mEq Oral Q4H   simvastatin  40 mg Oral QPM   triamcinolone   Topical BID   triamcinolone cream  1 application Topical BID   Continuous Infusions:  sodium chloride 100 mL/hr at 03/07/19 0739   PRN Meds: acetaminophen, acetaminophen, alum & mag hydroxide-simeth, hydrocortisone cream, ipratropium, ondansetron (ZOFRAN) IV, promethazine, traMADol   Vital Signs    Vitals:   03/06/19 1341 03/06/19 1742 03/06/19 2339 03/07/19 0607  BP: (!) 99/54  (!) 101/52 (!) 111/57  Pulse: (!) 57  64 70  Resp: 16  18 20   Temp: (!) 97.4 F (36.3 C) 98 F (36.7 C) 97.7 F (36.5 C) 97.7 F (36.5 C)  TempSrc: Oral  Oral Oral  SpO2: 90%  95%   Weight:      Height:        Intake/Output Summary (Last 24 hours) at 03/07/2019 0849 Last data filed at 03/07/2019 0600 Gross per 24 hour  Intake 1503.31 ml  Output 300 ml  Net 1203.31 ml   Last 3 Weights 03/05/2019 01/16/2019 08/04/2018  Weight (lbs) 116 lb 115 lb 9.6 oz 115 lb  Weight (kg) 52.617 kg 52.436 kg 52.164 kg      Telemetry    Sinus with PAT- Personally Reviewed  Physical Exam   GEN: No acute distress.   Neck: No JVD Cardiac: RRR, 2/6 systolic murmur Respiratory: Clear to auscultation  bilaterally. GI: Soft, nontender, non-distended  MS: No edema Neuro:  Nonfocal  Psych: Normal affect   Labs    High Sensitivity Troponin:   Recent Labs  Lab 02/18/19 1918 03/05/19 1202 03/05/19 1654 03/05/19 1958 03/05/19 2228  TROPONINIHS 35* 439* 352* 413* 396*      Chemistry Recent Labs  Lab 03/05/19 1202 03/06/19 0758 03/07/19 0519  NA 135 138 139  K 3.6 3.3* 3.0*  CL 100 105 108  CO2 18* 18* 20*  GLUCOSE 116* 76 83  BUN 95* 89* 74*  CREATININE 2.68* 2.28* 2.18*  CALCIUM 9.2 8.4* 8.1*  PROT 7.3  --   --   ALBUMIN 3.4*  --   --   AST 39  --   --   ALT 15  --   --   ALKPHOS 45  --   --   BILITOT 1.2  --   --   GFRNONAA 15* 18* 19*  GFRAA 17* 21* 22*  ANIONGAP 17* 15 11     Hematology Recent Labs  Lab 03/05/19 1202 03/07/19 0519  WBC 7.4 6.5  RBC 4.26 3.55*  HGB 13.5 11.1*  HCT 41.1 35.4*  MCV 96.5 99.7  MCH  31.7 31.3  MCHC 32.8 31.4  RDW 13.4 13.8  PLT 421* 321    Radiology    Ct Abdomen Pelvis Wo Contrast  Result Date: 03/05/2019 CLINICAL DATA:  Nausea and vomiting EXAM: CT ABDOMEN AND PELVIS WITHOUT CONTRAST TECHNIQUE: Multidetector CT imaging of the abdomen and pelvis was performed following the standard protocol without IV contrast. COMPARISON:  CT 02/18/2019, 11/08/2016 FINDINGS: Lower chest: 14 mm right middle lobe pulmonary nodule near the hilum, series 9, image number 6. Appears slightly larger compared with 2013 chest CT. Large incompletely visualized hiatal hernia containing mesentery, stomach and colon. Small left-sided pleural effusion. The heart size is normal. Hepatobiliary: No focal liver abnormality is seen. Small calcified gallstones. No biliary dilatation Pancreas: Unremarkable. No pancreatic ductal dilatation or surrounding inflammatory changes. Spleen: Normal in size without focal abnormality. Adrenals/Urinary Tract: Adrenal glands are normal. Kidneys show no hydronephrosis. There are bilateral renal cysts. The urinary bladder  contains a small amount of air. Stomach/Bowel: The stomach is nonenlarged. No dilated small bowel. No colon wall thickening. Sigmoid colon diverticular disease without acute inflammatory process. Vascular/Lymphatic: Moderate to marked aortic atherosclerosis without aneurysm. 15 mm left Peri aortic lymph node. Reproductive: Status post hysterectomy. No adnexal masses. Other: Negative for free air or free fluid. Musculoskeletal: Degenerative changes of the spine. Stable 12 mm anterolisthesis L4 on L5. IMPRESSION: 1. No CT evidence for acute intra-abdominal or pelvic abnormality. 2. Incompletely visualized large hiatal hernia. 3. 14 mm right middle lobe pulmonary nodule, appears slightly enlarged as compared with chest CT from 2013. Pulmonary consultation may be considered. 4. Gallstones 5. Sigmoid colon diverticular disease without acute inflammatory change 6. Decreased air within the urinary bladder 7. 15 mm left retroperitoneal node either reactive or neoplastic in etiology. Electronically Signed   By: Donavan Foil M.D.   On: 03/05/2019 18:48   Ct Head Wo Contrast  Result Date: 03/05/2019 CLINICAL DATA:  Altered consciousness. EXAM: CT HEAD WITHOUT CONTRAST TECHNIQUE: Contiguous axial images were obtained from the base of the skull through the vertex without intravenous contrast. COMPARISON:  06/17/2012, report only. FINDINGS: Brain: Expected cerebral and cerebellar volume loss for age. Relatively mild for age low density in the periventricular white matter likely related to small vessel disease. No mass lesion, hemorrhage, hydrocephalus, acute infarct, intra-axial, or extra-axial fluid collection. Vascular: Intracranial atherosclerosis. Skull: No significant soft tissue swelling.  No skull fracture. Sinuses/Orbits: Normal imaged portions of the orbits and globes. Hypoplastic frontal sinuses. Clear mastoid air cells. Other: None. IMPRESSION: 1.  No acute intracranial abnormality. 2.  Cerebral atrophy and small  vessel ischemic change. Electronically Signed   By: Abigail Miyamoto M.D.   On: 03/05/2019 18:21   Dg Chest Portable 1 View  Result Date: 03/05/2019 CLINICAL DATA:  No bowel movement for several days. Increasing fatigue. EXAM: PORTABLE CHEST 1 VIEW COMPARISON:  Radiographs 02/18/2019 and 06/13/2018. CT 10/18/2009. FINDINGS: 1530 hours. The heart size and mediastinal contours are stable. There is aortic atherosclerosis and a large hiatal hernia which has a paraesophageal component. There is chronic left basilar atelectasis adjacent to the hiatal hernia. The right lung is clear. There is no pleural effusion or pneumothorax. No acute osseous findings. Previous left shoulder reverse arthroplasty and distal right clavicle resection. IMPRESSION: Stable appearance of the chest with chronic left basilar atelectasis and large hiatal hernia. No acute cardiopulmonary process. Electronically Signed   By: Richardean Sale M.D.   On: 03/05/2019 16:20   Dg Abd Portable 2 Views  Result Date: 03/05/2019 CLINICAL  DATA:  Increased fatigue. No bowel movement for several days. EXAM: PORTABLE ABDOMEN - 2 VIEW COMPARISON:  CT 02/18/2019. Radiographs 06/28/2016. FINDINGS: The bowel gas pattern is nonobstructive. There is no free intraperitoneal air. Pelvic phleboliths and diffuse vascular calcifications are noted. There are degenerative changes throughout the spine without apparent acute osseous findings. IMPRESSION: No acute abdominal findings. Electronically Signed   By: Richardean Sale M.D.   On: 03/05/2019 16:17    Patient Profile     85 year old female with past medical history of hypertension, hyperlipidemia, left bundle branch block, chronic stage III kidney disease, hypothyroidism for evaluation of chest pain.  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  She is a no CODE BLUE.  She has no recollection of chest pain.  Troponin 352, 413, 396. Electrocardiogram shows sinus rhythm, first-degree AV block, left bundle branch  block, worsening anterior T wave inversion.  Echocardiogram showed normal LV function, grade 2 diastolic dysfunction, mild to moderate tricuspid regurgitation, mild aortic insufficiency.  Assessment & Plan     1 chest pain/elevated troponin-as outlined previously patient does not recall having chest pain.  Troponin was elevated but also in the setting of renal insufficiency.  Echocardiogram shows normal LV function.  Patient is nearing 85 years old, is no CODE BLUE and has significant baseline renal insufficiency.  She is not a candidate for aggressive cardiac evaluation.  Continue aspirin and statin.    2 acute on chronic stage IV kidney disease-renal function is worse.  ARB and Lasix on hold.  Some improvement with hydration.  Cardiology will sign off.  Continue aspirin and statin.  Would not resume Lasix unless she becomes volume overloaded.  Would not resume ARB; if blood pressure increases could add low-dose amlodipine.  Patient can follow-up with Dr. Gwenlyn Found in 3 months.  For questions or updates, please contact [place] Please consult [email or web address] for contact info under        Signed, Kirk Ruths, MD  03/07/2019, 8:49 AM

## 2019-03-07 NOTE — TOC Initial Note (Signed)
Transition of Care [facility]) - Initial/Assessment Note    Patient Details  Name: Cindy [name] MRN: [id number] Date of Birth: Sep 11, 1922  [facility] CM/SW Contact:    Wende Neighbors, LCSW Phone Number: 03/07/2019, 12:27 PM  Clinical Narrative:    Patient lives at home with a 24 hour caregiver named Dance movement psychotherapist. Per [name] he is agreeable to have [place] come into the home for patient to be able to do therapy. [name] stated he would prefer [facility] because they have worked with patient in the past. CSW contacted [place] and made referral. [place] stated they will be abel to follow patient at discharge but wont be able to start until the weekend due to the holiday.  [name] stated he will be able to transport patient home via personal vehicle. Daughter is aware of discharge plan                 Expected Discharge Plan: [place] Barriers to Discharge: No Barriers Identified   Patient Goals and CMS Choice Patient states their goals for this hospitalization and ongoing recovery are:: spoke with dtr (to have patient home) CMS Medicare.gov Compare Post Acute Care list provided to:: Other (Comment Required)(caregiver made decision) Choice offered to / list presented to : (care giver made decision on [facility] agency)  Expected Discharge Plan and Services Expected Discharge Plan: [place] In-house Referral: Clinical Social Work   Post Acute Care Choice: [place] arrangements for the past 2 months: [place] Expected Discharge Date: 03/07/19                         HH Arranged: PT, Social Work, Therapist, sports, Nurse's Aide [place] Agency: [facility] Date [place]: 03/07/19 Time [place]: 76 Representative spoke with at [place]: [place] Arrangements/Services Living arrangements for the past 2 months: [place] with:: Self, Other (Comment)(has a caregiver) Patient language and  need for interpreter reviewed:: Yes Do you feel safe going back to the place where you live?: Yes      Need for Family Participation in Patient Care: Yes (Comment) Care giver support system in place?: Yes (comment) Current home services: Other (comment)(caregiver) Criminal Activity/Legal Involvement Pertinent to Current Situation/Hospitalization: No - Comment as needed  Activities of Daily Living Home Assistive Devices/Equipment: Bedside commode/3-in-1, Built-in shower seat, Wheelchair, Hearing aid, Eyeglasses(ramp entrance, walk-in shower, bilateral hearing aids) ADL Screening (condition at time of admission) Patient's cognitive ability adequate to safely complete daily activities?: Yes Is the patient deaf or have difficulty hearing?: Yes(wears bilateral hearing aids) Does the patient have difficulty seeing, even when wearing glasses/contacts?: No Does the patient have difficulty concentrating, remembering, or making decisions?: Yes Patient able to express need for assistance with ADLs?: Yes Does the patient have difficulty dressing or bathing?: Yes Independently performs ADLs?: No Communication: Independent Dressing (OT): Needs assistance Is this a change from baseline?: Pre-admission baseline Grooming: Needs assistance Is this a change from baseline?: Pre-admission baseline Feeding: Needs assistance Is this a change from baseline?: Pre-admission baseline Bathing: Needs assistance Is this a change from baseline?: Pre-admission baseline Toileting: Needs assistance Is this a change from baseline?: Pre-admission baseline In/Out Bed: Needs assistance Is this a change from baseline?: Pre-admission baseline Walks in Home: Needs assistance Is this a change from baseline?: Pre-admission baseline Does the patient have difficulty walking or climbing stairs?: Yes(secondary to weakness) Weakness of  Legs: Both Weakness of Arms/Hands: None  Permission Sought/Granted Permission sought to  share information with : Family Supports Permission granted to share information with : Yes, Verbal Permission Granted  Share Information with NAME: [name]     Permission granted to share info w Relationship: daughter  Permission granted to share info w Contact Information: [phone]  Emotional Assessment Appearance:: Appears stated age Attitude/Demeanor/Rapport: Engaged Affect (typically observed): Accepting Orientation: : Oriented to Self, Oriented to Place, Oriented to  Time, Oriented to Situation Alcohol / Substance Use: Not Applicable Psych Involvement: No (comment)  Admission diagnosis:  Lung nodule [R91.1] NSTEMI (non-ST elevated myocardial infarction) ([place]) [I21.4] AKI (acute kidney injury) ([place]) [N17.9] Patient Active Problem List   Diagnosis Date Noted  . NSTEMI (non-ST elevated myocardial infarction) ([place]) 03/05/2019  . Pain due to onychomycosis of toenails of both feet 10/03/2018  . H/O total shoulder replacement, left 08/04/2018  . Pneumonia 06/13/2018  . Pressure injury of skin 06/13/2018  . Abdominal pain 11/08/2016  . Acute lower UTI 11/08/2016  . Acute cystitis without hematuria   . SBO (small bowel obstruction) ([place]) 06/28/2016  . Constipation, chronic 06/28/2016  . Acute distention of stomach 06/28/2016  . Eventration of left crus of diaphragm 06/27/2016  . Uncontrolled hypertension 06/27/2016  . Gastric outlet obstruction 06/27/2016  . CKD (chronic kidney disease) stage 3, GFR 30-59 ml/min (HCC) 06/14/2016  . Primary hypothyroidism 06/14/2016  . Osteoporosis, postmenopausal 06/14/2016  . OAB (overactive bladder) 02/25/2016  . Left bundle branch block 12/14/2012  . Essential hypertension 12/14/2012  . Hyperlipidemia 12/14/2012  . Pain in shoulder 11/11/2011  . Melanoma of left upper arm ([place]) 10/19/2011  . Lung mass 06/16/2011  . Acquired deformities of toe 06/14/2011  . Personal history of other diseases of the digestive system 06/07/2011  .  GERD (gastroesophageal reflux disease) 06/07/2011   PCP:  Lorne Skeens, MD Pharmacy:   [facility] [phone] - [address], [address] AT [facility] OF [address] [place] [place] [address] Phone: [phone] Fax: [phone]     Social Determinants of Health ([place]) Interventions    Readmission Risk Interventions No flowsheet data found.

## 2019-06-08 ENCOUNTER — Encounter: Payer: Self-pay | Admitting: Cardiovascular Disease

## 2019-06-08 ENCOUNTER — Ambulatory Visit (INDEPENDENT_AMBULATORY_CARE_PROVIDER_SITE_OTHER): Payer: Medicare Other | Admitting: Cardiovascular Disease

## 2019-06-08 ENCOUNTER — Other Ambulatory Visit: Payer: Self-pay

## 2019-06-08 DIAGNOSIS — I447 Left bundle-branch block, unspecified: Secondary | ICD-10-CM

## 2019-06-08 DIAGNOSIS — E782 Mixed hyperlipidemia: Secondary | ICD-10-CM | POA: Diagnosis not present

## 2019-06-08 DIAGNOSIS — I214 Non-ST elevation (NSTEMI) myocardial infarction: Secondary | ICD-10-CM

## 2019-06-08 DIAGNOSIS — I10 Essential (primary) hypertension: Secondary | ICD-10-CM | POA: Diagnosis not present

## 2019-06-08 NOTE — Progress Notes (Signed)
06/08/2019 [name]   1923-04-01  [id number]  Primary Physician Altheimer, Legrand Como, MD Primary Cardiologist: Lorretta Harp MD Lupe Carney, [place]  HPI:  Cindy [name] is a 86 y.o.   thin and frail appearing married Caucasian female whose husband [name] a patient of mine.Unfortunately show passed away on 04/03/16 after they have been married for 68 years. She has 2 living children and 6 grandchildren.I last saw her in the office  01/16/2019...  She is accompanied by her caregiver [name] today.  She has a history of normal coronary arteries by cath in 2005 and again in 2007. She has chronic left bundle branch block, hypertension and hyperlipidemia. She also has GERD, followed by Dr. Carol Ada with an endoscopy in the past. [name] followed her lipid profile.She is fairly independent and is accompanied by her caregiver who is here for several hours in the morning. She does live alone and continues to drive and to her daily chores.  Since I saw her in the office 4 months ago she was admitted to the hospital with weakness 03/05/2019.  Her enzymes were mildly elevated.  Her serum creatinine was elevated as well consistent with chronic renal insufficiency.  She was seen in consultation by Dr. Stanford Breed who felt that she did not require an invasive evaluation which I agree with.  Her 2D echo was normal.  She has had no recurrent symptoms.   Current Meds  Medication Sig  . acetaminophen (TYLENOL) 325 MG tablet Take 1-2 tablets (325-650 mg total) by mouth every 6 (six) hours as needed for mild pain or moderate pain (pain score 1-3 or temp > 100.5).  Marland Kitchen Apoaequorin (PREVAGEN PO) Take 1 tablet by mouth daily.  Marland Kitchen aspirin 81 MG tablet Take 81 mg by mouth daily.  . cetirizine (ZYRTEC) 10 MG tablet Take 10 mg by mouth daily.  . clobetasol (TEMOVATE) 0.05 % external solution Apply 1 application topically 2 (two) times a week.   Marland Kitchen CRANBERRY PO Take 1 tablet by mouth daily.   .  fluocinolone (SYNALAR) 0.025 % cream Apply 1 application topically 2 (two) times daily.  Marland Kitchen HYDROcodone-acetaminophen (NORCO/VICODIN) 5-325 MG tablet Take 1 tablet by mouth every 6 (six) hours as needed for severe pain (pain).  . hydrocortisone 2.5 % cream Apply 1 application topically 2 (two) times daily as needed (dry skin).   Marland Kitchen ipratropium (ATROVENT) 0.06 % nasal spray Place 1-2 sprays into both nostrils every 6 (six) hours as needed (rhinorrhea).  Marland Kitchen ketoconazole (NIZORAL) 2 % shampoo Apply 1 application topically 3 (three) times a week.   . levothyroxine (SYNTHROID) 75 MCG tablet Take 75 mcg by mouth daily before breakfast.   . mirabegron ER (MYRBETRIQ) 50 MG TB24 tablet Take 1 tablet once a day  . Multiple Vitamins-Minerals (HAIR SKIN AND NAILS FORMULA PO) Take 1 tablet by mouth 3 (three) times daily.  Marland Kitchen nystatin (NYSTATIN) powder Apply 1 Bottle topically 4 (four) times daily.  . Omega-3 1000 MG CAPS Take 1,000 mg by mouth 2 (two) times a day.  . omega-3 acid ethyl esters (LOVAZA) 1 g capsule Take 1 g by mouth 2 (two) times daily.  Marland Kitchen omeprazole (PRILOSEC) 20 MG capsule Take 20 mg by mouth daily.  . polyethylene glycol (MIRALAX) 17 g packet Take 17 g by mouth daily.  . potassium chloride 20 MEQ TBCR Take 20 mEq by mouth daily.  . simvastatin (ZOCOR) 40 MG tablet Take 40 mg by mouth every evening.   Marland Kitchen  solifenacin (VESICARE) 10 MG tablet Take 10 mg by mouth daily.  . traMADol (ULTRAM) 50 MG tablet Take 50 mg by mouth every 8 (eight) hours as needed for moderate pain.   Marland Kitchen triamcinolone cream (KENALOG) 0.1 % Apply 1 application topically 2 (two) times daily.     Allergies  Allergen Reactions  . Fish Allergy Hives  . Diclofenac Other (See Comments)  . Sulfa Antibiotics     Unknown reaction    Social History   Socioeconomic History  . Marital status: Widowed    Spouse name: Not on file  . Number of children: Not on file  . Years of education: Not on file  . Highest education level:  Not on file  Occupational History  . Occupation: Retired  Tobacco Use  . Smoking status: Never Smoker  . Smokeless tobacco: Never Used  Substance and Sexual Activity  . Alcohol use: No  . Drug use: No  . Sexual activity: Not on file  Other Topics Concern  . Not on file  Social History Narrative   Has a caregiver daily   Social Determinants of Health   Financial Resource Strain:   . Difficulty of Paying Living Expenses: Not on file  Food Insecurity:   . Worried About Charity fundraiser in the Last Year: Not on file  . Ran Out of Food in the Last Year: Not on file  Transportation Needs:   . Lack of Transportation (Medical): Not on file  . Lack of Transportation (Non-Medical): Not on file  Physical Activity:   . Days of Exercise per Week: Not on file  . Minutes of Exercise per Session: Not on file  Stress:   . Feeling of Stress : Not on file  Social Connections:   . Frequency of Communication with Friends and Family: Not on file  . Frequency of Social Gatherings with Friends and Family: Not on file  . Attends Religious Services: Not on file  . Active Member of Clubs or Organizations: Not on file  . Attends Archivist Meetings: Not on file  . Marital Status: Not on file  Intimate Partner Violence:   . Fear of Current or Ex-Partner: Not on file  . Emotionally Abused: Not on file  . Physically Abused: Not on file  . Sexually Abused: Not on file     Review of Systems: General: negative for chills, fever, night sweats or weight changes.  Cardiovascular: negative for chest pain, dyspnea on exertion, edema, orthopnea, palpitations, paroxysmal nocturnal dyspnea or shortness of breath Dermatological: negative for rash Respiratory: negative for cough or wheezing Urologic: negative for hematuria Abdominal: negative for nausea, vomiting, diarrhea, bright red blood per rectum, melena, or hematemesis Neurologic: negative for visual changes, syncope, or dizziness All  other systems reviewed and are otherwise negative except as noted above.    Blood pressure (!) 181/91, pulse 87, height 5\' 1"  (1.549 m), weight 101 lb 12.8 oz (46.2 kg), SpO2 94 %.  General appearance: alert and no distress Neck: no adenopathy, no carotid bruit, no JVD, supple, symmetrical, trachea midline and thyroid not enlarged, symmetric, no tenderness/mass/nodules Lungs: clear to auscultation bilaterally Heart: regular rate and rhythm, S1, S2 normal, no murmur, click, rub or gallop Extremities: extremities normal, atraumatic, no cyanosis or edema Pulses: 2+ and symmetric Skin: Skin color, texture, turgor normal. No rashes or lesions Neurologic: Alert and oriented X 3, normal strength and tone. Normal symmetric reflexes. Normal coordination and gait  EKG not performed today  ASSESSMENT  AND PLAN:   Left bundle branch block Chronic  Essential hypertension History of essential hypertension is on olmesartan in the past liver she does have chronic renal insufficiency.  Her blood pressure today is 181/91 although they rushed to get here.  Her caregiver, [name], says her blood pressure at home is in the 130/70 range.  Hyperlipidemia Of hyperlipidemia on statin therapy with lipid profile performed 03/05/2019 revealing total cholesterol 202, LDL of 120 and HDL 56.  In a 86 year old woman with document normal coronary arteries I think this is an acceptable lipid profile.  NSTEMI (non-ST elevated myocardial infarction) ([place]) Recent admission for weakness and non-STEMI 03/05/2019.  She was seen by Dr. Stanford Breed.  She did have chronic renal insufficiency.  A 2D echo was essentially normal.  It was decided not to pursue an invasive approach which I agree with.  She said documented normal coronary arteries twice in the past by cardiac catheterization which I performed in 2005 and again in 2007.      Lorretta Harp MD FACP,FACC,FAHA, [facility] 06/08/2019 10:56 AM

## 2019-06-08 NOTE — Assessment & Plan Note (Signed)
Recent admission for weakness and non-STEMI 03/05/2019.  She was seen by Dr. Stanford Breed.  She did have chronic renal insufficiency.  A 2D echo was essentially normal.  It was decided not to pursue an invasive approach which I agree with.  She said documented normal coronary arteries twice in the past by cardiac catheterization which I performed in 2005 and again in 2007.

## 2019-06-08 NOTE — Assessment & Plan Note (Signed)
Of hyperlipidemia on statin therapy with lipid profile performed 03/05/2019 revealing total cholesterol 202, LDL of 120 and HDL 56.  In a 86 year old woman with document normal coronary arteries I think this is an acceptable lipid profile.

## 2019-06-08 NOTE — Assessment & Plan Note (Signed)
Chronic. 

## 2019-06-08 NOTE — Assessment & Plan Note (Signed)
History of essential hypertension is on olmesartan in the past liver she does have chronic renal insufficiency.  Her blood pressure today is 181/91 although they rushed to get here.  Her caregiver, [name], says her blood pressure at home is in the 130/70 range.

## 2019-06-08 NOTE — Patient Instructions (Signed)
Medication Instructions:  Your physician recommends that you continue on your current medications as directed. Please refer to the Current Medication list given to you today.  If you need a refill on your cardiac medications before your next appointment, please call your pharmacy.   Lab work: NONE  Testing/Procedures: NONE  Follow-Up: At [facility], you and your health needs are our priority.  As part of our continuing mission to provide you with exceptional heart care, we have created designated Provider Care Teams.  These Care Teams include your primary Cardiologist (physician) and Advanced Practice Providers (APPs -  Physician Assistants and Nurse Practitioners) who all work together to provide you with the care you need, when you need it. You may see Quay Burow, MD or one of the following Advanced Practice Providers on your designated Care Team:    Kerin Ransom, PA-C  [place], [place]  Coletta Memos, [place]  Your physician wants you to follow-up in: 6 months with a physicians assistant and 1 year with Dr. Gwenlyn Found. You will receive a reminder letter in the mail two months in advance. If you don't receive a letter, please call our office to schedule the follow-up appointment.

## 2019-11-13 ENCOUNTER — Encounter: Payer: Self-pay | Admitting: Cardiology

## 2019-11-13 ENCOUNTER — Ambulatory Visit (INDEPENDENT_AMBULATORY_CARE_PROVIDER_SITE_OTHER): Payer: Medicare Other | Admitting: Cardiology

## 2019-11-13 ENCOUNTER — Other Ambulatory Visit: Payer: Self-pay

## 2019-11-13 VITALS — BP 130/82 | HR 69 | Ht 60.0 in | Wt 104.6 lb

## 2019-11-13 DIAGNOSIS — I447 Left bundle-branch block, unspecified: Secondary | ICD-10-CM | POA: Diagnosis not present

## 2019-11-13 DIAGNOSIS — N184 Chronic kidney disease, stage 4 (severe): Secondary | ICD-10-CM | POA: Diagnosis not present

## 2019-11-13 DIAGNOSIS — I10 Essential (primary) hypertension: Secondary | ICD-10-CM

## 2019-11-13 NOTE — Assessment & Plan Note (Signed)
Controlled.  

## 2019-11-13 NOTE — Patient Instructions (Signed)
Medication Instructions:  Your Physician recommend you continue on your current medication as directed.    *If you need a refill on your cardiac medications before your next appointment, please call your pharmacy*   Lab Work: None   Testing/Procedures: None   Follow-Up: At [facility], you and your health needs are our priority.  As part of our continuing mission to provide you with exceptional heart care, we have created designated Provider Care Teams.  These Care Teams include your primary Cardiologist (physician) and Advanced Practice Providers (APPs -  Physician Assistants and Nurse Practitioners) who all work together to provide you with the care you need, when you need it.  We recommend signing up for the patient portal called "MyChart".  Sign up information is provided on this After Visit Summary.  MyChart is used to connect with patients for Virtual Visits (Telemedicine).  Patients are able to view lab/test results, encounter notes, upcoming appointments, etc.  Non-urgent messages can be sent to your provider as well.   To learn more about what you can do with MyChart, go to [email or web address].    Your next appointment:   6 month(s)  The format for your next appointment:   In Person  Provider:   Quay Burow, MD

## 2019-11-13 NOTE — Assessment & Plan Note (Signed)
Chronic. 

## 2019-11-13 NOTE — Progress Notes (Signed)
Cardiology Office Note:    Date:  11/13/2019   ID:  Cindy [name], DOB 10/11/1922, MRN [id number]  PCP:  Lorne Skeens, MD  Cardiologist:  Quay Burow, MD  Electrophysiologist:  None   Referring MD: Lorne Skeens, MD   No chief complaint on file.   History of Present Illness:    Cindy [name] is a 86 y.o. female followed by Dr Gwenlyn Found with a hx of normal coronaries in 2005 and again in 2007.  She has chronic renal insufficiency stage 4.  She has a chronic left bundle branch block.  She lives in her own home and has an aide that stays with her from morning till night.  She was last admitted November 2020 with generalized weakness, cardiology saw her in consult.  Her echocardiogram showed preserved LV function, no further work-up was recommended.  She saw Dr. Alvester Chou in the office in February 2021.  I am seeing her today for routine 84-month checkup.  Since we saw her last she has been doing well, no new complaints.  She has had no issues with unusual shortness of breath although she appears a little short of breath at rest her O2 sat at 95%.  Her aide accompanied her and confirmed that she is been stable since her last office visit.  Past Medical History:  Diagnosis Date  . Arthritis   . Chronic renal insufficiency, stage III (moderate)   . Depression   . Dyslipidemia   . Forgetfulness   . GERD (gastroesophageal reflux disease)    takes Omeprazole for Hital Hernia   . Hiatal hernia   . History of nuclear stress test 2005   persantine; inferoapical ischmeia w/ EF 83%; no CAD at cath  . HOH (hard of hearing)    hearing aids  . Hypertension   . Hypothyroidism   . LBBB (left bundle branch block)    chronic per Dr. Gwenlyn Found  . Lung nodule 07/01/03   right middle lobe  . Macular degeneration   . Malignant melanoma ([place]) 11/12/04   L upper arm, MOHS_ right hand, leg  . Murmur, cardiac   . Osteoporosis   . Pneumonia 06/2018    Past Surgical History:  Procedure Laterality Date  .  BLADDER REPAIR    . CARDIAC CATHETERIZATION  2005/2007   normal coronaries   . CATARACT EXTRACTION     bilat  . coccyx  1951   excision  . COLONOSCOPY    . knee     right cartilage repair  . REVERSE SHOULDER ARTHROPLASTY Left 08/04/2018   Procedure: REVERSE LEFT SHOULDER ARTHROPLASTY;  Surgeon: Netta Cedars, MD;  Location: [facility];  Service: Orthopedics;  Laterality: Left;  . ROTATOR CUFF REPAIR     right  . TOTAL ABDOMINAL HYSTERECTOMY W/ BILATERAL SALPINGOOPHORECTOMY    . TRANSTHORACIC ECHOCARDIOGRAM  2007   EF=>55%; borderline conc LVH; LA mod dilated; RA mild-mod dilated; mild TR; mild AVR with mildly sclerotic AV leavlefts; mild PV regurg     Current Medications: Current Meds  Medication Sig  . acetaminophen (TYLENOL) 325 MG tablet Take 1-2 tablets (325-650 mg total) by mouth every 6 (six) hours as needed for mild pain or moderate pain (pain score 1-3 or temp > 100.5).  Marland Kitchen Apoaequorin (PREVAGEN PO) Take 1 tablet by mouth daily.  Marland Kitchen aspirin 81 MG tablet Take 81 mg by mouth daily.  . cetirizine (ZYRTEC) 10 MG tablet Take 10 mg by mouth daily.  . clobetasol (TEMOVATE) 0.05 % external solution Apply 1  application topically 2 (two) times a week.   Marland Kitchen CRANBERRY PO Take 1 tablet by mouth daily.   . fluocinolone (SYNALAR) 0.025 % cream Apply 1 application topically 2 (two) times daily.  Marland Kitchen HYDROcodone-acetaminophen (NORCO/VICODIN) 5-325 MG tablet Take 1 tablet by mouth every 6 (six) hours as needed for severe pain (pain).  . hydrocortisone 2.5 % cream Apply 1 application topically 2 (two) times daily as needed (dry skin).   Marland Kitchen ipratropium (ATROVENT) 0.06 % nasal spray Place 1-2 sprays into both nostrils every 6 (six) hours as needed (rhinorrhea).  Marland Kitchen ketoconazole (NIZORAL) 2 % shampoo Apply 1 application topically 3 (three) times a week.   . levothyroxine (SYNTHROID) 75 MCG tablet Take 75 mcg by mouth daily before breakfast.   . mirabegron ER (MYRBETRIQ) 50 MG TB24 tablet Take 1 tablet once a  day  . Multiple Vitamins-Minerals (HAIR SKIN AND NAILS FORMULA PO) Take 1 tablet by mouth 3 (three) times daily.  Marland Kitchen nystatin (NYSTATIN) powder Apply 1 Bottle topically 4 (four) times daily.  . Omega-3 1000 MG CAPS Take 1,000 mg by mouth 2 (two) times a day.  . omega-3 acid ethyl esters (LOVAZA) 1 g capsule Take 1 g by mouth 2 (two) times daily.  Marland Kitchen omeprazole (PRILOSEC) 20 MG capsule Take 20 mg by mouth daily.  . polyethylene glycol (MIRALAX) 17 g packet Take 17 g by mouth daily.  . potassium chloride 20 MEQ TBCR Take 20 mEq by mouth daily.  . simvastatin (ZOCOR) 40 MG tablet Take 40 mg by mouth every evening.   . solifenacin (VESICARE) 10 MG tablet Take 10 mg by mouth daily.  . traMADol (ULTRAM) 50 MG tablet Take 50 mg by mouth every 8 (eight) hours as needed for moderate pain.   Marland Kitchen triamcinolone cream (KENALOG) 0.1 % Apply 1 application topically 2 (two) times daily.     Allergies:   Fish allergy, Diclofenac, and Sulfa antibiotics   Social History   Socioeconomic History  . Marital status: Widowed    Spouse name: Not on file  . Number of children: Not on file  . Years of education: Not on file  . Highest education level: Not on file  Occupational History  . Occupation: Retired  Tobacco Use  . Smoking status: Never Smoker  . Smokeless tobacco: Never Used  Vaping Use  . Vaping Use: Never used  Substance and Sexual Activity  . Alcohol use: No  . Drug use: No  . Sexual activity: Not on file  Other Topics Concern  . Not on file  Social History Narrative   Has a caregiver daily   Social Determinants of Health   Financial Resource Strain:   . Difficulty of Paying Living Expenses:   Food Insecurity:   . Worried About Charity fundraiser in the Last Year:   . Arboriculturist in the Last Year:   Transportation Needs:   . Film/video editor (Medical):   Marland Kitchen Lack of Transportation (Non-Medical):   Physical Activity:   . Days of Exercise per Week:   . Minutes of Exercise per  Session:   Stress:   . Feeling of Stress :   Social Connections:   . Frequency of Communication with Friends and Family:   . Frequency of Social Gatherings with Friends and Family:   . Attends Religious Services:   . Active Member of Clubs or Organizations:   . Attends Archivist Meetings:   Marland Kitchen Marital Status:  Family History: The patient's family history includes Cancer in her daughter, father, maternal aunt, maternal grandmother, and son.  ROS:   Please see the history of present illness.     All other systems reviewed and are negative.  EKGs/Labs/Other Studies Reviewed:    The following studies were reviewed today: Echo nov 2020-   EKG:  EKG is ordered today.  The ekg ordered today demonstrates NSR, LVH-HR 69  Recent Labs: 03/05/2019: ALT 15 03/07/2019: BUN 74; Creatinine, Ser 2.18; Hemoglobin 11.1; Platelets 321; Potassium 3.0; Sodium 139  Recent Lipid Panel    Component Value Date/Time   CHOL 202 (H) 03/05/2019 1958   TRIG 130 03/05/2019 1958   HDL 56 03/05/2019 1958   CHOLHDL 3.6 03/05/2019 1958   VLDL 26 03/05/2019 1958   LDLCALC 120 (H) 03/05/2019 1958    Physical Exam:    VS:  BP 130/82   Pulse 69   Ht 5' (1.524 m)   Wt 104 lb 9.6 oz (47.4 kg)   SpO2 95%   BMI 20.43 kg/m     Wt Readings from Last 3 Encounters:  11/13/19 104 lb 9.6 oz (47.4 kg)  06/08/19 101 lb 12.8 oz (46.2 kg)  03/05/19 116 lb (52.6 kg)     GEN: Elderly frail appearing Caucasian female  in no acute distress HEENT: Normal NECK: No JVD; No carotid bruits CARDIAC: RRR, no murmurs, rubs, gallops RESPIRATORY:  Clear to auscultation without rales, wheezing or rhonchi  ABDOMEN: Soft, non-tender, non-distended MUSCULOSKELETAL:  No edema; No deformity  SKIN: Warm and dry NEUROLOGIC:  Alert and oriented x 3 PSYCHIATRIC:  Normal affect   ASSESSMENT:    Essential hypertension Controlled  Left bundle branch block Chronic  CRI (chronic renal insufficiency), stage 4  (severe) (HCC) GFR 19  PLAN:    F/U Dr Gwenlyn Found in 6 months and Dr Altheimer as scheduled.   Medication Adjustments/Labs and Tests Ordered: Current medicines are reviewed at length with the patient today.  Concerns regarding medicines are outlined above.  Orders Placed This Encounter  Procedures  . EKG 12-Lead   No orders of the defined types were placed in this encounter.   Patient Instructions  Medication Instructions:  Your Physician recommend you continue on your current medication as directed.    *If you need a refill on your cardiac medications before your next appointment, please call your pharmacy*   Lab Work: None   Testing/Procedures: None   Follow-Up: At [facility], you and your health needs are our priority.  As part of our continuing mission to provide you with exceptional heart care, we have created designated Provider Care Teams.  These Care Teams include your primary Cardiologist (physician) and Advanced Practice Providers (APPs -  Physician Assistants and Nurse Practitioners) who all work together to provide you with the care you need, when you need it.  We recommend signing up for the patient portal called "MyChart".  Sign up information is provided on this After Visit Summary.  MyChart is used to connect with patients for Virtual Visits (Telemedicine).  Patients are able to view lab/test results, encounter notes, upcoming appointments, etc.  Non-urgent messages can be sent to your provider as well.   To learn more about what you can do with MyChart, go to [email or web address].    Your next appointment:   6 month(s)  The format for your next appointment:   In Person  Provider:   Quay Burow, MD        Signed, Lurena Joiner  Reino Bellis  11/13/2019 11:29 AM    [facility]

## 2019-11-13 NOTE — Assessment & Plan Note (Signed)
GFR 19

## 2019-11-16 ENCOUNTER — Other Ambulatory Visit: Payer: Self-pay

## 2019-11-16 ENCOUNTER — Emergency Department (HOSPITAL_COMMUNITY): Payer: Medicare Other

## 2019-11-16 ENCOUNTER — Inpatient Hospital Stay (HOSPITAL_COMMUNITY)
Admission: EM | Admit: 2019-11-16 | Discharge: 2019-11-19 | DRG: 176 | Disposition: A | Discharge status: Home Health | Payer: Medicare Other | Attending: Family Medicine | Admitting: Family Medicine

## 2019-11-16 ENCOUNTER — Encounter (HOSPITAL_COMMUNITY): Payer: Self-pay

## 2019-11-16 DIAGNOSIS — Z9842 Cataract extraction status, left eye: Secondary | ICD-10-CM

## 2019-11-16 DIAGNOSIS — R531 Weakness: Secondary | ICD-10-CM

## 2019-11-16 DIAGNOSIS — Z9841 Cataract extraction status, right eye: Secondary | ICD-10-CM

## 2019-11-16 DIAGNOSIS — I2510 Atherosclerotic heart disease of native coronary artery without angina pectoris: Secondary | ICD-10-CM | POA: Diagnosis present

## 2019-11-16 DIAGNOSIS — Z20822 Contact with and (suspected) exposure to covid-19: Secondary | ICD-10-CM | POA: Diagnosis present

## 2019-11-16 DIAGNOSIS — M810 Age-related osteoporosis without current pathological fracture: Secondary | ICD-10-CM | POA: Diagnosis present

## 2019-11-16 DIAGNOSIS — R54 Age-related physical debility: Secondary | ICD-10-CM | POA: Diagnosis present

## 2019-11-16 DIAGNOSIS — Z96612 Presence of left artificial shoulder joint: Secondary | ICD-10-CM | POA: Diagnosis present

## 2019-11-16 DIAGNOSIS — R778 Other specified abnormalities of plasma proteins: Secondary | ICD-10-CM | POA: Diagnosis present

## 2019-11-16 DIAGNOSIS — I129 Hypertensive chronic kidney disease with stage 1 through stage 4 chronic kidney disease, or unspecified chronic kidney disease: Secondary | ICD-10-CM | POA: Diagnosis present

## 2019-11-16 DIAGNOSIS — F0390 Unspecified dementia without behavioral disturbance: Secondary | ICD-10-CM | POA: Diagnosis not present

## 2019-11-16 DIAGNOSIS — K8689 Other specified diseases of pancreas: Secondary | ICD-10-CM

## 2019-11-16 DIAGNOSIS — Z515 Encounter for palliative care: Secondary | ICD-10-CM

## 2019-11-16 DIAGNOSIS — H9190 Unspecified hearing loss, unspecified ear: Secondary | ICD-10-CM | POA: Diagnosis present

## 2019-11-16 DIAGNOSIS — I2699 Other pulmonary embolism without acute cor pulmonale: Secondary | ICD-10-CM

## 2019-11-16 DIAGNOSIS — I2692 Saddle embolus of pulmonary artery without acute cor pulmonale: Secondary | ICD-10-CM | POA: Diagnosis not present

## 2019-11-16 DIAGNOSIS — Z66 Do not resuscitate: Secondary | ICD-10-CM | POA: Diagnosis present

## 2019-11-16 DIAGNOSIS — K219 Gastro-esophageal reflux disease without esophagitis: Secondary | ICD-10-CM

## 2019-11-16 DIAGNOSIS — Z85820 Personal history of malignant melanoma of skin: Secondary | ICD-10-CM

## 2019-11-16 DIAGNOSIS — I10 Essential (primary) hypertension: Secondary | ICD-10-CM

## 2019-11-16 DIAGNOSIS — E785 Hyperlipidemia, unspecified: Secondary | ICD-10-CM | POA: Diagnosis present

## 2019-11-16 DIAGNOSIS — I351 Nonrheumatic aortic (valve) insufficiency: Secondary | ICD-10-CM | POA: Diagnosis present

## 2019-11-16 DIAGNOSIS — E782 Mixed hyperlipidemia: Secondary | ICD-10-CM

## 2019-11-16 DIAGNOSIS — K8050 Calculus of bile duct without cholangitis or cholecystitis without obstruction: Secondary | ICD-10-CM

## 2019-11-16 DIAGNOSIS — Z90722 Acquired absence of ovaries, bilateral: Secondary | ICD-10-CM

## 2019-11-16 DIAGNOSIS — Z79890 Hormone replacement therapy (postmenopausal): Secondary | ICD-10-CM

## 2019-11-16 DIAGNOSIS — E039 Hypothyroidism, unspecified: Secondary | ICD-10-CM | POA: Diagnosis present

## 2019-11-16 DIAGNOSIS — M6250 Muscle wasting and atrophy, not elsewhere classified, unspecified site: Secondary | ICD-10-CM | POA: Diagnosis present

## 2019-11-16 DIAGNOSIS — N184 Chronic kidney disease, stage 4 (severe): Secondary | ICD-10-CM | POA: Diagnosis not present

## 2019-11-16 DIAGNOSIS — Z808 Family history of malignant neoplasm of other organs or systems: Secondary | ICD-10-CM

## 2019-11-16 DIAGNOSIS — Z91013 Allergy to seafood: Secondary | ICD-10-CM

## 2019-11-16 DIAGNOSIS — K862 Cyst of pancreas: Secondary | ICD-10-CM | POA: Diagnosis present

## 2019-11-16 DIAGNOSIS — Z882 Allergy status to sulfonamides status: Secondary | ICD-10-CM

## 2019-11-16 DIAGNOSIS — Z7189 Other specified counseling: Secondary | ICD-10-CM

## 2019-11-16 DIAGNOSIS — R5381 Other malaise: Secondary | ICD-10-CM | POA: Diagnosis present

## 2019-11-16 DIAGNOSIS — I447 Left bundle-branch block, unspecified: Secondary | ICD-10-CM

## 2019-11-16 DIAGNOSIS — D72829 Elevated white blood cell count, unspecified: Secondary | ICD-10-CM | POA: Diagnosis present

## 2019-11-16 DIAGNOSIS — Z79899 Other long term (current) drug therapy: Secondary | ICD-10-CM

## 2019-11-16 DIAGNOSIS — Z888 Allergy status to other drugs, medicaments and biological substances status: Secondary | ICD-10-CM

## 2019-11-16 DIAGNOSIS — Z7982 Long term (current) use of aspirin: Secondary | ICD-10-CM

## 2019-11-16 DIAGNOSIS — Z90710 Acquired absence of both cervix and uterus: Secondary | ICD-10-CM

## 2019-11-16 HISTORY — DX: Other pulmonary embolism without acute cor pulmonale: I26.99

## 2019-11-16 LAB — COMPREHENSIVE METABOLIC PANEL
ALT: 14 U/L (ref 0–44)
AST: 15 U/L (ref 15–41)
Albumin: 3.7 g/dL (ref 3.5–5.0)
Alkaline Phosphatase: 53 U/L (ref 38–126)
Anion gap: 10 (ref 5–15)
BUN: 29 mg/dL — ABNORMAL HIGH (ref 8–23)
CO2: 24 mmol/L (ref 22–32)
Calcium: 9 mg/dL (ref 8.9–10.3)
Chloride: 108 mmol/L (ref 98–111)
Creatinine, Ser: 1.04 mg/dL — ABNORMAL HIGH (ref 0.44–1.00)
GFR calc Af Amer: 53 mL/min — ABNORMAL LOW (ref >60)
GFR calc non Af Amer: 46 mL/min — ABNORMAL LOW (ref >60)
Glucose, Bld: 117 mg/dL — ABNORMAL HIGH (ref 70–99)
Potassium: 4.1 mmol/L (ref 3.5–5.1)
Sodium: 142 mmol/L (ref 135–145)
Total Bilirubin: 0.8 mg/dL (ref 0.3–1.2)
Total Protein: 7.1 g/dL (ref 6.5–8.1)

## 2019-11-16 LAB — CBC
HCT: 40.8 % (ref 36.0–46.0)
Hemoglobin: 13.3 g/dL (ref 12.0–15.0)
MCH: 32.7 pg (ref 26.0–34.0)
MCHC: 32.6 g/dL (ref 30.0–36.0)
MCV: 100.2 fL — ABNORMAL HIGH (ref 80.0–100.0)
Platelets: 264 10*3/uL (ref 150–400)
RBC: 4.07 MIL/uL (ref 3.87–5.11)
RDW: 13.7 % (ref 11.5–15.5)
WBC: 14.3 10*3/uL — ABNORMAL HIGH (ref 4.0–10.5)
nRBC: 0 % (ref 0.0–0.2)

## 2019-11-16 LAB — PROTIME-INR
INR: 1.1 (ref 0.8–1.2)
Prothrombin Time: 13.4 seconds (ref 11.4–15.2)

## 2019-11-16 LAB — APTT: aPTT: 31 seconds (ref 24–36)

## 2019-11-16 LAB — LIPASE, BLOOD: Lipase: 20 U/L (ref 11–51)

## 2019-11-16 MED ORDER — SODIUM CHLORIDE (PF) 0.9 % IJ SOLN
INTRAMUSCULAR | Status: AC
  Filled 2019-11-16: qty 50

## 2019-11-16 MED ORDER — SODIUM CHLORIDE 0.9% FLUSH: 3.0000 mL | Freq: Once | INTRAVENOUS | Status: DC

## 2019-11-16 MED ORDER — IOHEXOL 300 MG/ML  SOLN
100.0000 mL | Freq: Once | INTRAMUSCULAR | Status: AC | PRN
  Administered 2019-11-16: 80 mL via INTRAVENOUS

## 2019-11-16 MED ORDER — SODIUM CHLORIDE 0.9 % IV BOLUS
500.0000 mL | Freq: Once | INTRAVENOUS | Status: AC
  Administered 2019-11-16: 500 mL via INTRAVENOUS

## 2019-11-16 MED ORDER — TRAMADOL HCL 50 MG PO TABS
50.0000 mg | ORAL_TABLET | Freq: Once | ORAL | Status: DC
  Filled 2019-11-16: qty 1

## 2019-11-16 MED ORDER — IOHEXOL 350 MG/ML SOLN
100.0000 mL | Freq: Once | INTRAVENOUS | Status: AC | PRN
  Administered 2019-11-16: 100 mL via INTRAVENOUS

## 2019-11-16 MED ORDER — HEPARIN (PORCINE) 25000 UT/250ML-% IV SOLN
750.0000 [IU]/h | INTRAVENOUS | Status: DC
  Administered 2019-11-16: 750 [IU]/h via INTRAVENOUS
  Filled 2019-11-16: qty 250

## 2019-11-16 MED ORDER — HEPARIN BOLUS VIA INFUSION
2000.0000 [IU] | Freq: Once | INTRAVENOUS | Status: AC
  Administered 2019-11-16: 2000 [IU] via INTRAVENOUS
  Filled 2019-11-16: qty 2000

## 2019-11-16 NOTE — ED Triage Notes (Signed)
Patient c/o right upper abdominal pain since this AM. Patient denies any N/v/D.

## 2019-11-16 NOTE — ED Notes (Signed)
ED Provider at bedside. 

## 2019-11-16 NOTE — ED Notes (Signed)
Patient transported to CT 

## 2019-11-16 NOTE — Progress Notes (Signed)
ANTICOAGULATION CONSULT NOTE - Initial Consult  Pharmacy Consult for Heparin Indication: pulmonary embolus  Allergies  Allergen Reactions  . Fish Allergy Hives  . Diclofenac Other (See Comments)  . Sulfa Antibiotics     Unknown reaction    Patient Measurements: Height: 5' (152.4 cm) Weight: 47.4 kg (104 lb 9.6 oz) IBW/kg (Calculated) : 45.5 Heparin Dosing Weight: actual body weight  Vital Signs: Temp: 97.2 F (36.2 C) (08/06 1757) Temp Source: Oral (08/06 1417) BP: 162/68 (08/06 2055) Pulse Rate: 62 (08/06 2055)  Labs: Recent Labs    11/16/19 1433  HGB 13.3  HCT 40.8  PLT 264  CREATININE 1.04*    Estimated Creatinine Clearance: 23.2 mL/min (A) (by C-G formula based on SCr of 1.04 mg/dL (H)).   Medical History: Past Medical History:  Diagnosis Date  . Arthritis   . Chronic renal insufficiency, stage III (moderate)   . Depression   . Dyslipidemia   . Forgetfulness   . GERD (gastroesophageal reflux disease)    takes Omeprazole for Hital Hernia   . Hiatal hernia   . History of nuclear stress test 2005   persantine; inferoapical ischmeia w/ EF 83%; no CAD at cath  . HOH (hard of hearing)    hearing aids  . Hypertension   . Hypothyroidism   . LBBB (left bundle branch block)    chronic per Dr. Gwenlyn Found  . Lung nodule 07/01/03   right middle lobe  . Macular degeneration   . Malignant melanoma ([place]) 11/12/04   L upper arm, MOHS_ right hand, leg  . Murmur, cardiac   . Osteoporosis   . Pneumonia 06/2018    Medications:  No oral anticoagulation PTA  Assessment:  86 yr female presents with c/o right upper abdominal pain. Patient with known gallstones  CT = + acute pulmonary embolus  Goal of Therapy:  Heparin level 0.3-0.7 units/ml Monitor platelets by anticoagulation protocol: Yes   Plan:   Baseline aPTT and PT/INR  Heparin 2000 unit IV bolus x 1 followed by infusion @ 750 units/hr  Check heparin level 8 hr after infusion started  Follow daily  heparin level and CBC  Nevin Kozuch, Toribio Harbour, PharmD 11/16/2019,10:52 PM

## 2019-11-16 NOTE — ED Notes (Signed)
US at bedside

## 2019-11-16 NOTE — H&P (Signed)
Taleisha Kaczynski YHC:[id number] DOB: 1922-12-25 DOA: 11/16/2019     PCP: Lorne Skeens, MD   Outpatient Specialists:   CARDS: [place], Dr. Gwenlyn Found    Patient arrived to ER on 11/16/19 at 1407 Referred by Attending Wyvonnia Dusky, MD   Patient coming from: home Lives alone,     With 24 h caretaker    Chief Complaint:  Chief Complaint  Patient presents with  . Abdominal Pain    HPI: Jackline Castilla is a 86 y.o. female with medical history significant of CAD,  chronic renal insufficiency stage 4, chronic left bundle branch block, dementia, arthritis, HLD, GERD, hypothyroidism    Presented with  RUQ pain and Chest pain  No nausea vomiting associated with pain, short of breath. She had had decreased p.o. intake.  Reports sharp pain in her right upper quadrant. No diarrhea no fever. Patient did have history of gallstones.   Infectious risk factors:  Reports shortness of breath,  chest pain,     Has   been vaccinated against COVID    Initial COVID TEST   in house  PCR testing  Pending  Lab Results  Component Value Date   [place] 03/05/2019   [place] NEGATIVE 02/18/2019     Regarding pertinent Chronic problems:   Hyperlipidemia -   on statins Zocor Lipid Panel     Component Value Date/Time   CHOL 202 (H) 03/05/2019 1958   TRIG 130 03/05/2019 1958   HDL 56 03/05/2019 1958   CHOLHDL 3.6 03/05/2019 1958   VLDL 26 03/05/2019 1958   LDLCALC 120 (H) 03/05/2019 1958     HTN   not on medications    Hypothyroidism: on synthroid          CKD stage IV - baseline Cr 2.2 Estimated Creatinine Clearance: 23.2 mL/min (A) (by C-G formula based on SCr of 1.04 mg/dL (H)).  Lab Results  Component Value Date   CREATININE 1.04 (H) 11/16/2019   CREATININE 2.18 (H) 03/07/2019   CREATININE 2.28 (H) 03/06/2019         Dementia -not on any medications    While in ER: Initially was worked up for gallstone disease which was unremarkable US showed no  cholecystitis CT abd showed incidental Right sided PE    Hospitalist was called for admission for PE  The following Work up has been ordered so far:  Orders Placed This Encounter  Procedures  . US Abdomen Limited RUQ  . CT ABDOMEN PELVIS W CONTRAST  . CT Angio Chest PE W and/or Wo Contrast  . Lipase, blood  . Comprehensive metabolic panel  . CBC  . Urinalysis, Routine w reflex microscopic  . Protime-INR  . APTT  . Diet NPO time specified  . [place] Lock IV, Maintain IV access  . heparin per pharmacy consult  . Consult to hospitalist  ALL PATIENTS BEING ADMITTED/HAVING PROCEDURES NEED COVID-19 SCREENING    Following Medications were ordered in ER: Medications  sodium chloride flush (NS) 0.9 % injection 3 mL (has no administration in time range)  sodium chloride (PF) 0.9 % injection (has no administration in time range)  traMADol (ULTRAM) tablet 50 mg (has no administration in time range)  sodium chloride (PF) 0.9 % injection (has no administration in time range)  heparin bolus via infusion 2,000 Units (has no administration in time range)    Followed by  heparin ADULT infusion 100 units/mL (25000 units/268mL sodium chloride 0.45%) (has no administration in time range)  sodium chloride 0.9 % bolus 500 mL (0 mLs Intravenous Stopped 11/16/19 2240)  iohexol (OMNIPAQUE) 300 MG/ML solution 100 mL (80 mLs Intravenous Contrast Given 11/16/19 2129)  iohexol (OMNIPAQUE) 350 MG/ML injection 100 mL (100 mLs Intravenous Contrast Given 11/16/19 2246)        Consult Orders  (From admission, onward)         Start     Ordered   11/16/19 2241  Consult to hospitalist  ALL PATIENTS BEING ADMITTED/HAVING PROCEDURES NEED COVID-19 SCREENING  Once       Comments: ALL PATIENTS BEING ADMITTED/HAVING PROCEDURES NEED COVID-19 SCREENING  Provider:  (Not yet assigned)  Question Answer Comment  Place call to: [facility]   Reason for Consult Admit      11/16/19 2240          Significant  initial  Findings: Abnormal Labs Reviewed  COMPREHENSIVE METABOLIC PANEL - Abnormal; Notable for the following components:      Result Value   Glucose, Bld 117 (*)    BUN 29 (*)    Creatinine, Ser 1.04 (*)    GFR calc non Af Amer 46 (*)    GFR calc Af Amer 53 (*)    All other components within normal limits  CBC - Abnormal; Notable for the following components:   WBC 14.3 (*)    MCV 100.2 (*)    All other components within normal limits     Otherwise labs showing:    Recent Labs  Lab 11/16/19 1433  NA 142  K 4.1  CO2 24  GLUCOSE 117*  BUN 29*  CREATININE 1.04*  CALCIUM 9.0    Cr    stable,   Lab Results  Component Value Date   CREATININE 1.04 (H) 11/16/2019   CREATININE 2.18 (H) 03/07/2019   CREATININE 2.28 (H) 03/06/2019    Recent Labs  Lab 11/16/19 1433  AST 15  ALT 14  ALKPHOS 53  BILITOT 0.8  PROT 7.1  ALBUMIN 3.7   Lab Results  Component Value Date   CALCIUM 9.0 11/16/2019    WBC      Component Value Date/Time   WBC 14.3 (H) 11/16/2019 1433   ANC    Component Value Date/Time   NEUTROABS 3.8 03/07/2019 0519   NEUTROABS 5.2 11/04/2014 1430   ALC No components found for: LYMPHAB    Plt: Lab Results  Component Value Date   PLT 264 11/16/2019     COVID-19 Labs  No results for input(s): DDIMER, FERRITIN, LDH, CRP in the last 72 hours.  Lab Results  Component Value Date   SARSCOV2NAA NEGATIVE 03/05/2019   [place] NEGATIVE 02/18/2019     HG/HCT   stable,       Component Value Date/Time   HGB 13.3 11/16/2019 1433   HGB 11.9 11/04/2014 1430   HCT 40.8 11/16/2019 1433   HCT 36.2 11/04/2014 1430    Recent Labs  Lab 11/16/19 1433  LIPASE 20   No results for input(s): AMMONIA in the last 168 hours.    Troponin  ordered     ECG: Ordered Personally reviewed by me showing: HR : 65 Rhythm:  , LBBB,     QTC 487   DM  labs:  HbA1C: Recent Labs    03/06/19 0758  HGBA1C 6.0*       UA  ordered    Ordered  Abdominal ultrasound cholelithiasis no cholecystitis    CTabd/pelvis - inicdental PE small right-sided pleural effusion left-sided hiatal hernia  cholelithiasis 2.5 cystic lesion of pancreas needs to be followed up as an outpatient  CTA chest - large  PE, saddle on the right      ED Triage Vitals  Enc Vitals Group     BP 11/16/19 1417 (!) 150/77     Pulse Rate 11/16/19 1417 69     Resp 11/16/19 1417 16     Temp 11/16/19 1417 (!) 97.4 F (36.3 C)     Temp Source 11/16/19 1417 Oral     SpO2 11/16/19 1417 95 %     Weight 11/16/19 1418 104 lb 9.6 oz (47.4 kg)     Height 11/16/19 1418 5' (1.524 m)     Head Circumference --      Peak Flow --      Pain Score 11/16/19 1417 8     Pain Loc --      Pain Edu? --      Excl. in [place]? --   TMAX(24)@       Latest  Blood pressure (!) 162/68, pulse 62, temperature (!) 97.2 F (36.2 C), resp. rate (!) 24, height 5' (1.524 m), weight 47.4 kg, SpO2 92 %.      Review of Systems:    Pertinent positives include: shortness of breath at rest. dyspnea on exertion,  abdominal pain chest pain, Constitutional:  No weight loss, night sweats, Fevers, chills, fatigue, weight loss  HEENT:  No headaches, Difficulty swallowing,Tooth/dental problems,Sore throat,  No sneezing, itching, ear ache, nasal congestion, post nasal drip,  Cardio-vascular:  No  Orthopnea, PND, anasarca, dizziness, palpitations.no Bilateral lower extremity swelling  GI:  No heartburn, indigestion, , nausea, vomiting, diarrhea, change in bowel habits, loss of appetite, melena, blood in stool, hematemesis Resp:  no No excess mucus, no productive cough, No non-productive cough, No coughing up of blood.No change in color of mucus.No wheezing. Skin:  no rash or lesions. No jaundice GU:  no dysuria, change in color of urine, no urgency or frequency. No straining to urinate.  No flank pain.  Musculoskeletal:  No joint pain or no joint swelling. No decreased range of motion. No  back pain.  Psych:  No change in mood or affect. No depression or anxiety. No memory loss.  Neuro: no localizing neurological complaints, no tingling, no weakness, no double vision, no gait abnormality, no slurred speech, no confusion  All systems reviewed and apart from [place] all are negative  Past Medical History:   Past Medical History:  Diagnosis Date  . Arthritis   . Chronic renal insufficiency, stage III (moderate)   . Depression   . Dyslipidemia   . Forgetfulness   . GERD (gastroesophageal reflux disease)    takes Omeprazole for Hital Hernia   . Hiatal hernia   . History of nuclear stress test 2005   persantine; inferoapical ischmeia w/ EF 83%; no CAD at cath  . HOH (hard of hearing)    hearing aids  . Hypertension   . Hypothyroidism   . LBBB (left bundle branch block)    chronic per Dr. Gwenlyn Found  . Lung nodule 07/01/03   right middle lobe  . Macular degeneration   . Malignant melanoma ([place]) 11/12/04   L upper arm, MOHS_ right hand, leg  . Murmur, cardiac   . Osteoporosis   . Pneumonia 06/2018      Past Surgical History:  Procedure Laterality Date  . BLADDER REPAIR    . CARDIAC CATHETERIZATION  2005/2007   normal coronaries   .  CATARACT EXTRACTION     bilat  . coccyx  1951   excision  . COLONOSCOPY    . knee     right cartilage repair  . REVERSE SHOULDER ARTHROPLASTY Left 08/04/2018   Procedure: REVERSE LEFT SHOULDER ARTHROPLASTY;  Surgeon: Netta Cedars, MD;  Location: [facility];  Service: Orthopedics;  Laterality: Left;  . ROTATOR CUFF REPAIR     right  . TOTAL ABDOMINAL HYSTERECTOMY W/ BILATERAL SALPINGOOPHORECTOMY    . TRANSTHORACIC ECHOCARDIOGRAM  2007   EF=>55%; borderline conc LVH; LA mod dilated; RA mild-mod dilated; mild TR; mild AVR with mildly sclerotic AV leavlefts; mild PV regurg     Social History:  Ambulatory  independently       reports that she has never smoked. She has never used smokeless tobacco. She reports that she does not drink  alcohol and does not use drugs.   Family History:   Family History  Problem Relation Age of Onset  . Cancer Father        NHL  . Cancer Maternal Grandmother        rectal  . Cancer Daughter        breast  . Cancer Maternal Aunt        breast  . Cancer Son        melanoma    Allergies: Allergies  Allergen Reactions  . Fish Allergy Hives  . Diclofenac Other (See Comments)  . Sulfa Antibiotics     Unknown reaction     Prior to Admission medications   Medication Sig Start Date End Date Taking? Authorizing Provider  acetaminophen (TYLENOL) 500 MG tablet Take 500 mg by mouth in the morning, at noon, and at bedtime.   Yes [provider]  aspirin 81 MG tablet Take 81 mg by mouth daily.   Yes [provider]  cetirizine (ZYRTEC) 10 MG tablet Take 10 mg by mouth daily.   Yes [provider]  ciprofloxacin (CIPRO) 250 MG tablet Take 250 mg by mouth as directed. Take PRN for Kidney Infection   Yes [provider]  clobetasol (TEMOVATE) 0.05 % external solution Apply 1 application topically 2 (two) times a week.  04/21/18  Yes [provider]  CRANBERRY PO Take 1 tablet by mouth daily.    Yes [provider]  fluocinolone (SYNALAR) 0.025 % cream Apply 1 application topically 2 (two) times daily.   Yes [provider]  glucosamine-chondroitin 500-400 MG tablet Take 1 tablet by mouth daily.   Yes [provider]  hydrocortisone 2.5 % cream Apply 1 application topically 2 (two) times daily as needed (dry skin).  02/23/18  Yes [provider]  hydroxypropyl methylcellulose / hypromellose (ISOPTO TEARS / GONIOVISC) 2.5 % ophthalmic solution Place 1 drop into both eyes daily.   Yes [provider]  ipratropium (ATROVENT) 0.06 % nasal spray Place 1-2 sprays into both nostrils every 6 (six) hours as needed (rhinorrhea).   Yes [provider]  ketoconazole (NIZORAL) 2 % shampoo Apply 1 application  topically 3 (three) times a week.  03/29/18  Yes [provider]  levothyroxine (SYNTHROID) 75 MCG tablet Take 75 mcg by mouth daily before breakfast.  07/01/16  Yes [provider]  mirabegron ER (MYRBETRIQ) 50 MG TB24 tablet Take 50 mg by mouth daily.  03/01/17  Yes [provider]  Multiple Vitamins-Minerals (HAIR SKIN AND NAILS FORMULA PO) Take 3 tablets by mouth daily.    Yes [provider]  Multiple  Vitamins-Minerals (OCUVITE EXTRA) TABS Take 1 tablet by mouth daily.   Yes [provider]  nitrofurantoin (MACRODANTIN) 100 MG capsule Take 100 mg by mouth as directed. Take PRN for Kidney Infection   Yes [provider]  Omega-3 1000 MG CAPS Take 1,000 mg by mouth 2 (two) times a day.   Yes [provider]  omega-3 acid ethyl esters (LOVAZA) 1 g capsule Take 1 g by mouth 2 (two) times daily.   Yes [provider]  omeprazole (PRILOSEC) 20 MG capsule Take 20 mg by mouth daily.   Yes [provider]  polyethylene glycol (MIRALAX) 17 g packet Take 17 g by mouth daily. 03/07/19  Yes Shelly Coss, MD  simvastatin (ZOCOR) 40 MG tablet Take 40 mg by mouth every evening.    Yes [provider]  solifenacin (VESICARE) 10 MG tablet Take 10 mg by mouth daily. 01/26/19  Yes [provider]  traMADol (ULTRAM) 50 MG tablet Take 50 mg by mouth every 8 (eight) hours as needed for moderate pain or severe pain.  09/12/18  Yes [provider]  Triamcinolone Acetonide 0.025 % LOTN Apply 1 application topically 2 (two) times daily as needed (scalp).  08/24/19  Yes [provider]  triamcinolone cream (KENALOG) 0.1 % Apply 1 application topically 2 (two) times daily.   Yes [provider]  acetaminophen (TYLENOL) 325 MG tablet Take 1-2 tablets (325-650 mg total) by mouth every 6 (six) hours as needed for mild pain or moderate pain (pain score 1-3 or temp > 100.5). Patient not taking: Reported on  11/16/2019 08/07/18   Netta Cedars, MD  HYDROcodone-acetaminophen (NORCO/VICODIN) 5-325 MG tablet Take 1 tablet by mouth every 6 (six) hours as needed for severe pain (pain). Patient not taking: Reported on 11/16/2019 08/07/18   Netta Cedars, MD  potassium chloride 20 MEQ TBCR Take 20 mEq by mouth daily. Patient not taking: Reported on 11/16/2019 03/08/19   Shelly Coss, MD   Physical Exam: Vitals with BMI 11/16/2019 11/16/2019 11/16/2019  Height - - 5\' 0"   Weight - - 104 lbs 10 oz  BMI - - 78.29  Systolic 562 130 -  Diastolic 68 99 -  Pulse 62 62 -     1. General:  in No  Acute distress   Chronically ill -appearing 2. Psychological: Alert and    Oriented to self 3. Head/ENT:     Dry Mucous Membranes                          Head Non traumatic, neck supple                           Poor Dentition 4. SKIN:   decreased Skin turgor,  Skin clean Dry and intact no rash 5. Heart: Regular rate and rhythm no  Murmur, no Rub or gallop 6. Lungs:  no wheezes or crackles   7. Abdomen: Soft, non-tender, Non distended  bowel sounds present 8. Lower extremities: no clubbing, cyanosis, no   edema 9. Neurologically Grossly intact, moving all 4 extremities equally   10. MSK: Normal range of motion   All other LABS:     Recent Labs  Lab 11/16/19 1433  WBC 14.3*  HGB 13.3  HCT 40.8  MCV 100.2*  PLT 264     Recent Labs  Lab 11/16/19 1433  NA 142  K 4.1  CL 108  CO2  24  GLUCOSE 117*  BUN 29*  CREATININE 1.04*  CALCIUM 9.0     Recent Labs  Lab 11/16/19 1433  AST 15  ALT 14  ALKPHOS 53  BILITOT 0.8  PROT 7.1  ALBUMIN 3.7       Cultures:    Component Value Date/Time   SDES  02/18/2019 1721    URINE, RANDOM Performed at [facility], [place] [address] [place], [place] [address]    SPECREQUEST  02/18/2019 1721    NONE Performed at [facility], [place] [address], [place], [place] [address]    CULT  02/18/2019 1721    NO GROWTH Performed  at [facility] [address] [place], [place] [address]    REPTSTATUS 02/20/2019 FINAL 02/18/2019 1721     Radiological Exams on Admission: CT ABDOMEN PELVIS W CONTRAST  Result Date: 11/16/2019 CLINICAL DATA:  Abdominal pain with leukocytosis EXAM: CT ABDOMEN AND PELVIS WITH CONTRAST TECHNIQUE: Multidetector CT imaging of the abdomen and pelvis was performed using the standard protocol following bolus administration of intravenous contrast. CONTRAST:  11mL OMNIPAQUE IOHEXOL 300 MG/ML  SOLN COMPARISON:  CT 03/05/2019, 02/18/2019, 11/08/2016 FINDINGS: Lower chest: Small right-sided pleural effusion 14 mm right middle lobe pulmonary nodule near the hilum, series 6, image number 8, previously 13 mm. Large left-sided hernia containing stomach, mesentery and colon. Incompletely visualized filling defects within the distal right pulmonary artery, extending into the right lower lobe pulmonary arteries consistent with acute pulmonary embolus. Hepatobiliary: No focal liver abnormality is seen. Punctate stones within the gallbladder. No biliary dilatation. Pancreas: Atrophic. No inflammatory change. 1.6 x 2.5 cm cystic lesion near the uncinate process of the pancreas. Spleen: Normal in size without focal abnormality. Adrenals/Urinary Tract: Adrenal glands are normal. There are cysts within the bilateral kidneys with additional subcentimeter hypodense lesions too small to further characterize. Urinary bladder is unremarkable Stomach/Bowel: Stomach herniated into the left chest. No dilated small bowel. No bowel wall thickening. Negative appendix. Descending and sigmoid colon diverticular disease without acute inflammatory change. Vascular/Lymphatic: Moderate severe aortic atherosclerosis. No aneurysm. No suspicious nodes. Reproductive: Status post hysterectomy. No adnexal masses. Other: Negative for free air or free fluid. Musculoskeletal: Degenerative changes of the lumbar spine. Anterolisthesis L4 on L5, L3 on  L4 with retrolisthesis L2 on L1. No acute osseous abnormality. IMPRESSION: 1. Incompletely visualized filling defects within the distal right pulmonary artery, extending into the right lower lobe pulmonary arteries consistent with acute pulmonary embolus. 2. Small right-sided pleural effusion. 14 mm right middle lobe pulmonary nodule, previously 13 mm. 3. Large left-sided hernia containing stomach, mesentery, and colon. No evidence for bowel obstruction. 4. Colonic diverticular disease without acute inflammatory change. 5. Cholelithiasis. 6. 2.5 cm cystic lesion near the uncinate process of the pancreas, intermittently visualized, slowly enlarging over time. Recommend clinical management given patient age. Critical Value/emergent results were called by telephone at the time of interpretation on 11/16/2019 at 10:23 pm to provider MATTHEW TRIFAN , who verbally acknowledged these results. Aortic Atherosclerosis (ICD10-I70.0). Electronically Signed   By: Donavan Foil M.D.   On: 11/16/2019 22:23   US Abdomen Limited RUQ  Result Date: 11/16/2019 CLINICAL DATA:  Biliary colic. EXAM: ULTRASOUND ABDOMEN LIMITED RIGHT UPPER QUADRANT COMPARISON:  None. FINDINGS: Gallbladder: Nonmobile, shadowing echogenic gallstones are seen within the gallbladder fundus. There is no evidence of gallbladder wall thickening (2 mm). No sonographic Murphy sign noted by sonographer. Common bile duct: Diameter: 2.5 mm Liver: No focal lesion identified. Within  normal limits in parenchymal echogenicity. Portal vein is patent on color Doppler imaging with normal direction of blood flow towards the liver. Other: Of incidental note is the presence of a right-sided pleural effusion. IMPRESSION: 1. Cholelithiasis, without evidence of acute cholecystitis. 2. Right pleural effusion. Electronically Signed   By: Virgina Norfolk M.D.   On: 11/16/2019 19:53    Chart has been reviewed    Assessment/Plan  86 y.o. female with medical history  significant of CAD,  chronic renal insufficiency stage 4, chronic left bundle branch block, dementia, arthritis, HLD, GERD, hypothyroidism Admitted for PE   Present on Admission: . Pulmonary embolism ([place]) - Admit to   Telemetry  Initiate heparin drip  Would likely benefit from case manager consult for long term anticoagulation Hold home blood pressure medications avoid hypotension Cycle cardiac enzymes Order echogram and lower extremity Dopplers  Most likely risk factors for hypercoagulable state being  sedentary lifestyle  unknown  large PE but no evidence of decompensation  . CRI (chronic renal insufficiency), stage 4 (severe) (HCC) creatinine actually improved from baseline down to 1.  Continue to monitor this could be actually due to muscle wasting.  Patient received IV contrast for her studies.  Will need to gently rehydrate and follow  . Essential hypertension not on any home meds we will continue to monitor for hypertension accelerated versus hypotension in setting of PE  . GERD (gastroesophageal reflux disease) stable continue home meds  . Hyperlipidemia -chronic stable continue Zocor  . Left bundle branch block -chronic stable  . Dementia ([employer]) -chronic stable continue to monitor for any signs of sundowning  . Hypothyroidism - - Check TSH continue home medications at current dose  pancreatic cystic lesion - will need to discuss with the family in a.m. regarding if they would like to pursue this if other studies  Other plan as per orders.  DVT prophylaxis:     heparin bolus via infusion 2,000 Units Start: 11/16/19 2300    Code Status:    Code Status: Prior   DNR/DNI  As per Patient    Family Communication:   Family not at  Bedside    Disposition Plan:    To home once workup is complete and patient is stable   Following barriers for discharge:                                                        Pain controlled with PO medications                                                         Will likely need home health,                                                 Would benefit from PT/OT eval prior to DC  Ordered                   Swallow eval - SLP ordered  Transition of care consulted                                      Palliative care    consulted                                   Consults called: none  Admission status:  ED Disposition    ED Disposition Condition Lane: [place] [[id number]]  Level of Care: Telemetry [5]  Admit to tele based on following criteria: Other see comments  Comments: pulmonary embolism  Covid Evaluation: Symptomatic Person Under Investigation (PUI)  Diagnosis: Pulmonary embolism ([place]) [[id number]]  Admitting Physician: Toy Baker [[id number]]  Attending Physician: Toy Baker [[id number]]        Obs      Level of care   tele  For 12H     Lab Results  Component Value Date   [place] 03/05/2019     Precautions: admitted as  asymptomatic screening protocol    PPE: Used by the provider:   P100  eye Goggles,  Gloves      Lacara Dunsworth 11/17/2019, 1:11 AM    [facility]     after 2 AM please page floor coverage PA If 7AM-7PM, please contact the day team taking care of the patient using [email or web address]   Patient was evaluated in the context of the global COVID-19 pandemic, which necessitated consideration that the patient might be at risk for infection with the SARS-CoV-2 virus that causes COVID-19. Institutional protocols and algorithms that pertain to the evaluation of patients at risk for COVID-19 are in a state of rapid change based on information released by regulatory bodies including the CDC and federal and state organizations. These policies and algorithms were followed during the patient's care.

## 2019-11-16 NOTE — ED Provider Notes (Signed)
Cindy [name]   CSN: [id number] Arrival date & time: 11/16/19  1407     History Chief Complaint  Patient presents with  . Abdominal Pain    Cindy [name] is a 86 y.o. female with history of dementia, gallstones, present emergency department with abdominal pain.  Patient cannot provide reliable history, but her caretaker Gwyndolyn Saxon at bedside provides additional history.  He says he lives with her 24 hours a day.  He states that about a week ago the patient was complaining of some nausea after attempting to eat lamb chops that he had prepared.  She has been complaining of nausea and poor appetite all week.  This morning she was complaining of sharp pain in her right upper quadrant.  She says she also felt that in her right shoulder.  She is unaware of her ever having these symptoms before.  He denies that she has had any vomiting or diarrhea this week.  Since arriving in the emergency department, the patient has been essentially pain-free.  Most recent CT scan from 03/05/2019 showed small calcified gallstones with no biliary dilatation, and sigmoid diverticulitis.  HPI     Past Medical History:  Diagnosis Date  . Arthritis   . Chronic renal insufficiency, stage III (moderate)   . Depression   . Dyslipidemia   . Forgetfulness   . GERD (gastroesophageal reflux disease)    takes Omeprazole for Hital Hernia   . Hiatal hernia   . History of nuclear stress test 2005   persantine; inferoapical ischmeia w/ EF 83%; no CAD at cath  . HOH (hard of hearing)    hearing aids  . Hypertension   . Hypothyroidism   . LBBB (left bundle branch block)    chronic per Dr. Gwenlyn Found  . Lung nodule 07/01/03   right middle lobe  . Macular degeneration   . Malignant melanoma ([place]) 11/12/04   L upper arm, MOHS_ right hand, leg  . Murmur, cardiac   . Osteoporosis   . Pneumonia 06/2018    Patient Active Problem List   Diagnosis Date Noted  . Pulmonary  embolism ([place]) 11/16/2019  . Dementia ([place]) 11/16/2019  . Hypothyroidism 11/16/2019  . Pancreatic mass 11/16/2019  . NSTEMI (non-ST elevated myocardial infarction) ([place]) 03/05/2019  . Pain due to onychomycosis of toenails of both feet 10/03/2018  . H/O total shoulder replacement, left 08/04/2018  . Pneumonia 06/13/2018  . Pressure injury of skin 06/13/2018  . Abdominal pain 11/08/2016  . Acute lower UTI 11/08/2016  . Acute cystitis without hematuria   . SBO (small bowel obstruction) ([place]) 06/28/2016  . Constipation, chronic 06/28/2016  . Acute distention of stomach 06/28/2016  . Eventration of left crus of diaphragm 06/27/2016  . Uncontrolled hypertension 06/27/2016  . Gastric outlet obstruction 06/27/2016  . CRI (chronic renal insufficiency), stage 4 (severe) ([place]) 06/14/2016  . Primary hypothyroidism 06/14/2016  . Osteoporosis, postmenopausal 06/14/2016  . OAB (overactive bladder) 02/25/2016  . Left bundle branch block 12/14/2012  . Essential hypertension 12/14/2012  . Hyperlipidemia 12/14/2012  . Pain in shoulder 11/11/2011  . Melanoma of left upper arm ([place]) 10/19/2011  . Lung mass 06/16/2011  . Acquired deformities of toe 06/14/2011  . Personal history of other diseases of the digestive system 06/07/2011  . GERD (gastroesophageal reflux disease) 06/07/2011    Past Surgical History:  Procedure Laterality Date  . BLADDER REPAIR    . CARDIAC CATHETERIZATION  2005/2007   normal coronaries   . CATARACT  EXTRACTION     bilat  . coccyx  1951   excision  . COLONOSCOPY    . knee     right cartilage repair  . REVERSE SHOULDER ARTHROPLASTY Left 08/04/2018   Procedure: REVERSE LEFT SHOULDER ARTHROPLASTY;  Surgeon: Netta Cedars, MD;  Location: [facility];  Service: Orthopedics;  Laterality: Left;  . ROTATOR CUFF REPAIR     right  . TOTAL ABDOMINAL HYSTERECTOMY W/ BILATERAL SALPINGOOPHORECTOMY    . TRANSTHORACIC ECHOCARDIOGRAM  2007   EF=>55%; borderline conc LVH; LA mod dilated;  RA mild-mod dilated; mild TR; mild AVR with mildly sclerotic AV leavlefts; mild PV regurg      OB History   No obstetric history on file.     Family History  Problem Relation Age of Onset  . Cancer Father        NHL  . Cancer Maternal Grandmother        rectal  . Cancer Daughter        breast  . Cancer Maternal Aunt        breast  . Cancer Son        melanoma    Social History   Tobacco Use  . Smoking status: Never Smoker  . Smokeless tobacco: Never Used  Vaping Use  . Vaping Use: Never used  Substance Use Topics  . Alcohol use: No  . Drug use: No    Home Medications Prior to Admission medications   Medication Sig Start Date End Date Taking? Authorizing Provider  acetaminophen (TYLENOL) 500 MG tablet Take 500 mg by mouth in the morning, at noon, and at bedtime.   Yes [provider]  aspirin 81 MG tablet Take 81 mg by mouth daily.   Yes [provider]  cetirizine (ZYRTEC) 10 MG tablet Take 10 mg by mouth daily.   Yes [provider]  ciprofloxacin (CIPRO) 250 MG tablet Take 250 mg by mouth as directed. Take PRN for Kidney Infection   Yes [provider]  clobetasol (TEMOVATE) 0.05 % external solution Apply 1 application topically 2 (two) times a week.  04/21/18  Yes [provider]  CRANBERRY PO Take 1 tablet by mouth daily.    Yes [provider]  fluocinolone (SYNALAR) 0.025 % cream Apply 1 application topically 2 (two) times daily.   Yes [provider]  glucosamine-chondroitin 500-400 MG tablet Take 1 tablet by mouth daily.   Yes [provider]  hydrocortisone 2.5 % cream Apply 1 application topically 2 (two) times daily as needed (dry skin).  02/23/18  Yes [provider]  hydroxypropyl methylcellulose / hypromellose (ISOPTO TEARS / GONIOVISC) 2.5 % ophthalmic solution Place 1 drop into both eyes daily.   Yes [provider]  ipratropium (ATROVENT) 0.06 % nasal spray Place  1-2 sprays into both nostrils every 6 (six) hours as needed (rhinorrhea).   Yes [provider]  ketoconazole (NIZORAL) 2 % shampoo Apply 1 application topically 3 (three) times a week.  03/29/18  Yes [provider]  levothyroxine (SYNTHROID) 75 MCG tablet Take 75 mcg by mouth daily before breakfast.  07/01/16  Yes [provider]  mirabegron ER (MYRBETRIQ) 50 MG TB24 tablet Take 50 mg by mouth daily.  03/01/17  Yes [provider]  Multiple Vitamins-Minerals (HAIR SKIN AND NAILS FORMULA PO) Take 3 tablets by mouth daily.    Yes [provider]  Multiple Vitamins-Minerals (OCUVITE EXTRA) TABS Take 1 tablet by mouth daily.   Yes [provider]  nitrofurantoin (MACRODANTIN) 100 MG capsule Take 100 mg by mouth as directed. Take PRN for Kidney Infection   Yes [provider]  Omega-3 1000 MG CAPS Take 1,000 mg by mouth 2 (two) times a day.   Yes [provider]  omega-3 acid ethyl esters (LOVAZA) 1 g capsule Take 1 g by mouth 2 (two) times daily.   Yes [provider]  omeprazole (PRILOSEC) 20 MG capsule Take 20 mg by mouth daily.   Yes [provider]  polyethylene glycol (MIRALAX) 17 g packet Take 17 g by mouth daily. 03/07/19  Yes Shelly Coss, MD  simvastatin (ZOCOR) 40 MG tablet Take 40 mg by mouth every evening.    Yes [provider]  solifenacin (VESICARE) 10 MG tablet Take 10 mg by mouth daily. 01/26/19  Yes [provider]  traMADol (ULTRAM) 50 MG tablet Take 50 mg by mouth every 8 (eight) hours as needed for moderate pain or severe pain.  09/12/18  Yes [provider]  Triamcinolone Acetonide 0.025 % LOTN Apply 1 application topically 2 (two) times daily as needed (scalp).  08/24/19  Yes [provider]  triamcinolone cream (KENALOG) 0.1 % Apply 1 application topically 2 (two) times daily.   Yes [provider]  acetaminophen (TYLENOL) 325 MG tablet Take 1-2  tablets (325-650 mg total) by mouth every 6 (six) hours as needed for mild pain or moderate pain (pain score 1-3 or temp > 100.5). Patient not taking: Reported on 11/16/2019 08/07/18   Netta Cedars, MD  HYDROcodone-acetaminophen (NORCO/VICODIN) 5-325 MG tablet Take 1 tablet by mouth every 6 (six) hours as needed for severe pain (pain). Patient not taking: Reported on 11/16/2019 08/07/18   Netta Cedars, MD  potassium chloride 20 MEQ TBCR Take 20 mEq by mouth daily. Patient not taking: Reported on 11/16/2019 03/08/19   Shelly Coss, MD    Allergies    Fish allergy, Diclofenac, and Sulfa antibiotics  Review of Systems   Review of Systems  Constitutional: Negative for chills and fever.  HENT: Negative for ear pain and sore throat.   Eyes: Negative for pain and visual disturbance.  Respiratory: Negative for cough and shortness of breath.   Cardiovascular: Negative for chest pain and palpitations.  Gastrointestinal: Positive for abdominal pain and nausea.  Genitourinary: Negative for dysuria and hematuria.  Musculoskeletal: Negative for arthralgias and back pain.  Skin: Negative for color change and rash.  Neurological: Negative for syncope and headaches.  All other systems reviewed and are negative.   Physical Exam Updated Vital Signs BP (!) 188/78   Pulse (!) 59   Temp (!) 97.2 F (36.2 C)   Resp 14   Ht 5' (1.524 m)   Wt 47.4 kg   SpO2 93%   BMI 20.43 kg/m   Physical Exam Vitals and nursing [name] reviewed.  Constitutional:      General: She is not in acute distress. HENT:     Head: Normocephalic and atraumatic.  Eyes:     Conjunctiva/sclera: Conjunctivae normal.  Cardiovascular:     Rate and Rhythm: Normal rate and regular rhythm.     Heart sounds: Normal heart sounds.  Pulmonary:     Effort: Pulmonary effort is normal. No respiratory distress.     Breath sounds: Normal breath sounds.  Abdominal:     Palpations: Abdomen is soft.     Tenderness: There is abdominal  tenderness in the right upper quadrant. There is no guarding or rebound. Positive  signs include Murphy's sign. Negative signs include McBurney's sign.  Musculoskeletal:     Cervical back: Neck supple.  Skin:    General: Skin is warm and dry.  Neurological:     Mental Status: She is alert.     ED Results / Procedures / Treatments   Labs (all labs ordered are listed, but only abnormal results are displayed) Labs Reviewed  COMPREHENSIVE METABOLIC PANEL - Abnormal; Notable for the following components:      Result Value   Glucose, Bld 117 (*)    BUN 29 (*)    Creatinine, Ser 1.04 (*)    GFR calc non Af Amer 46 (*)    GFR calc Af Amer 53 (*)    All other components within normal limits  CBC - Abnormal; Notable for the following components:   WBC 14.3 (*)    MCV 100.2 (*)    All other components within normal limits  SARS CORONAVIRUS 2 BY RT PCR (HOSPITAL ORDER, [facility])  LIPASE, BLOOD  PROTIME-INR  APTT  URINALYSIS, ROUTINE W REFLEX MICROSCOPIC  CBC  HEPARIN LEVEL (UNFRACTIONATED)  BRAIN NATRIURETIC PEPTIDE  MAGNESIUM  PHOSPHORUS  CBC WITH DIFFERENTIAL/PLATELET  TSH  COMPREHENSIVE METABOLIC PANEL  TROPONIN I (HIGH SENSITIVITY)    EKG EKG Interpretation  Date/Time:  Friday November 16 2019 23:18:32 EDT Ventricular Rate:  65 PR Interval:    QRS Duration: 155 QT Interval:  468 QTC Calculation: 487 R Axis:   -59 Text Interpretation: Normal sinus rhythm LBBB noted, also seen on Nov 2020 ECG, No STEMI Confirmed by Octaviano Glow [id number]) on 11/17/2019 12:39:03 AM   Radiology CT Angio Chest PE W and/or Wo Contrast  Result Date: 11/16/2019 CLINICAL DATA:  Possible PE on CT abdomen pelvis, right upper quadrant pain EXAM: CT ANGIOGRAPHY CHEST WITH CONTRAST TECHNIQUE: Multidetector CT imaging of the chest was performed using the standard protocol during bolus administration of intravenous contrast. Multiplanar CT image reconstructions and MIPs were  obtained to evaluate the vascular anatomy. CONTRAST:  56 mL OMNIPAQUE IOHEXOL 350 MG/ML SOLN COMPARISON:  CT 11/16/2019, chest CT 08/04/2011 FINDINGS: Cardiovascular: Satisfactory opacification of the pulmonary arteries to the segmental level. Acute thrombus within right upper lobe segmental pulmonary vessels with evidence of saddle embolus extending from right upper lobe pulmonary vessels to the right descending pulmonary artery and right lower lobe pulmonary arteries. Occlusive appearing thrombus within right middle lobe pulmonary vessels. Extension of embolus into right lower lobe segmental pulmonary vessels. Suspected moderate thrombus along the posterior wall of the left pulmonary artery, with extension of thrombus into left descending lobar and left lower lobe segmental vessels. RV LV ratio of 1.7. Nonaneurysmal aorta. Moderate aortic atherosclerosis. Coronary vascular calcification. Borderline cardiomegaly. No pericardial effusion. Mediastinum/Nodes: Midline trachea. No thyroid mass. Borderline left paratracheal lymph node measuring 12 mm. Esophagus within normal limits. Lungs/Pleura: Trace bilateral pleural effusions. Bronchiectasis within the right upper lobe and left greater than right lower lobes. Chronic consolidation in the left lower lobe with slight increased superimposed airspace disease. Chronic elevation of left diaphragm. 14 mm right middle lobe pulmonary nodule, series 7, image number 67. Upper Abdomen: Herniation of most of the stomach, in addition to colon and mesentery into the left lower chest as before. Musculoskeletal: Diffuse degenerative changes. No acute or suspicious osseous abnormality. Review of the MIP images confirms the above findings. IMPRESSION: 1. Positive for acute bilateral pulmonary emboli including saddle embolus on the right. Positive for acute PE with CTevidence of right  heart strain (RV/LV Ratio = 1.7) consistent with at least submassive (intermediate risk) PE. The  presence of right heart strain has been associated with an increased risk of morbidity and mortality. 2. Chronic elevation of the left diaphragm with large left-sided hernia. Chronic consolidation in the left lung base likely due to atelectasis, there is slightly increased airspace disease at the left base and a component of superimposed infarct is difficult to exclude. There is left greater than right lower lobe bronchiectasis which is also chronic. 3. Small bilateral right greater than left pleural effusion. 4. 14 mm right middle lobe pulmonary nodule, slight increase in size compared with 2013 study at which time the nodule measured 11 mm. Critical test result of pulmonary embolus previously called to Dr. Langston Masker at the time of previously reported CT abdomen and pelvis. Aortic Atherosclerosis (ICD10-I70.0). Electronically Signed   By: Donavan Foil M.D.   On: 11/16/2019 23:33   CT ABDOMEN PELVIS W CONTRAST  Result Date: 11/16/2019 CLINICAL DATA:  Abdominal pain with leukocytosis EXAM: CT ABDOMEN AND PELVIS WITH CONTRAST TECHNIQUE: Multidetector CT imaging of the abdomen and pelvis was performed using the standard protocol following bolus administration of intravenous contrast. CONTRAST:  39mL OMNIPAQUE IOHEXOL 300 MG/ML  SOLN COMPARISON:  CT 03/05/2019, 02/18/2019, 11/08/2016 FINDINGS: Lower chest: Small right-sided pleural effusion 14 mm right middle lobe pulmonary nodule near the hilum, series 6, image number 8, previously 13 mm. Large left-sided hernia containing stomach, mesentery and colon. Incompletely visualized filling defects within the distal right pulmonary artery, extending into the right lower lobe pulmonary arteries consistent with acute pulmonary embolus. Hepatobiliary: No focal liver abnormality is seen. Punctate stones within the gallbladder. No biliary dilatation. Pancreas: Atrophic. No inflammatory change. 1.6 x 2.5 cm cystic lesion near the uncinate process of the pancreas. Spleen: Normal in  size without focal abnormality. Adrenals/Urinary Tract: Adrenal glands are normal. There are cysts within the bilateral kidneys with additional subcentimeter hypodense lesions too small to further characterize. Urinary bladder is unremarkable Stomach/Bowel: Stomach herniated into the left chest. No dilated small bowel. No bowel wall thickening. Negative appendix. Descending and sigmoid colon diverticular disease without acute inflammatory change. Vascular/Lymphatic: Moderate severe aortic atherosclerosis. No aneurysm. No suspicious nodes. Reproductive: Status post hysterectomy. No adnexal masses. Other: Negative for free air or free fluid. Musculoskeletal: Degenerative changes of the lumbar spine. Anterolisthesis L4 on L5, L3 on L4 with retrolisthesis L2 on L1. No acute osseous abnormality. IMPRESSION: 1. Incompletely visualized filling defects within the distal right pulmonary artery, extending into the right lower lobe pulmonary arteries consistent with acute pulmonary embolus. 2. Small right-sided pleural effusion. 14 mm right middle lobe pulmonary nodule, previously 13 mm. 3. Large left-sided hernia containing stomach, mesentery, and colon. No evidence for bowel obstruction. 4. Colonic diverticular disease without acute inflammatory change. 5. Cholelithiasis. 6. 2.5 cm cystic lesion near the uncinate process of the pancreas, intermittently visualized, slowly enlarging over time. Recommend clinical management given patient age. Critical Value/emergent results were called by telephone at the time of interpretation on 11/16/2019 at 10:23 pm to provider Zunairah Devers , who verbally acknowledged these results. Aortic Atherosclerosis (ICD10-I70.0). Electronically Signed   By: Donavan Foil M.D.   On: 11/16/2019 22:23   US Abdomen Limited RUQ  Result Date: 11/16/2019 CLINICAL DATA:  Biliary colic. EXAM: ULTRASOUND ABDOMEN LIMITED RIGHT UPPER QUADRANT COMPARISON:  None. FINDINGS: Gallbladder: Nonmobile, shadowing  echogenic gallstones are seen within the gallbladder fundus. There is no evidence of gallbladder wall thickening (2 mm). No sonographic  Murphy sign noted by sonographer. Common bile duct: Diameter: 2.5 mm Liver: No focal lesion identified. Within normal limits in parenchymal echogenicity. Portal vein is patent on color Doppler imaging with normal direction of blood flow towards the liver. Other: Of incidental [name] is the presence of a right-sided pleural effusion. IMPRESSION: 1. Cholelithiasis, without evidence of acute cholecystitis. 2. Right pleural effusion. Electronically Signed   By: Virgina Norfolk M.D.   On: 11/16/2019 19:53    Procedures .Critical Care Performed by: Wyvonnia Dusky, MD Authorized by: Wyvonnia Dusky, MD   Critical care provider statement:    Critical care time (minutes):  45   Critical care was necessary to treat or prevent imminent or life-threatening deterioration of the following conditions:  Circulatory failure   Critical care was time spent personally by me on the following activities:  Discussions with consultants, evaluation of patient's response to treatment, examination of patient, ordering and performing treatments and interventions, ordering and review of laboratory studies, ordering and review of radiographic studies, pulse oximetry, re-evaluation of patient's condition, obtaining history from patient or surrogate and review of old charts Comments:     PE requiring IV heparin, goals of care discussion with family and caretaker   (including critical care time)  Medications Ordered in ED Medications  sodium chloride flush (NS) 0.9 % injection 3 mL (has no administration in time range)  sodium chloride (PF) 0.9 % injection (has no administration in time range)  traMADol (ULTRAM) tablet 50 mg (0 mg Oral Hold 11/16/19 2353)  sodium chloride (PF) 0.9 % injection (has no administration in time range)  heparin bolus via infusion 2,000 Units (2,000 Units  Intravenous Bolus from Bag 11/16/19 2314)    Followed by  heparin ADULT infusion 100 units/mL (25000 units/232mL sodium chloride 0.45%) (750 Units/hr Intravenous New Bag/Given 11/16/19 2312)  aspirin chewable tablet 81 mg (has no administration in time range)  levothyroxine (SYNTHROID) tablet 75 mcg (has no administration in time range)  mirabegron ER (MYRBETRIQ) tablet 50 mg (has no administration in time range)  simvastatin (ZOCOR) tablet 40 mg (has no administration in time range)  darifenacin (ENABLEX) 24 hr tablet 7.5 mg (has no administration in time range)  traMADol (ULTRAM) tablet 50 mg (has no administration in time range)  sodium chloride flush (NS) 0.9 % injection 3 mL (has no administration in time range)  acetaminophen (TYLENOL) tablet 650 mg (has no administration in time range)    Or  acetaminophen (TYLENOL) suppository 650 mg (has no administration in time range)  HYDROcodone-acetaminophen (NORCO/VICODIN) 5-325 MG per tablet 1-2 tablet (has no administration in time range)  docusate sodium (COLACE) capsule 100 mg (has no administration in time range)  ondansetron (ZOFRAN) tablet 4 mg (has no administration in time range)    Or  ondansetron (ZOFRAN) injection 4 mg (has no administration in time range)  0.9 %  sodium chloride infusion (has no administration in time range)  sodium chloride 0.9 % bolus 500 mL (0 mLs Intravenous Stopped 11/16/19 2240)  iohexol (OMNIPAQUE) 300 MG/ML solution 100 mL (80 mLs Intravenous Contrast Given 11/16/19 2129)  iohexol (OMNIPAQUE) 350 MG/ML injection 100 mL (100 mLs Intravenous Contrast Given 11/16/19 2246)    ED Course  I have reviewed the triage vital signs and the nursing notes.  Pertinent labs & imaging results that were available during my care of the patient were reviewed by me and considered in my medical decision making (see chart for details).  86 yo female here with  nausea, diminished appetite, right lower chest/RUQ pain for several days.   Worse with inspiration at times.  Also worse with eating.  + murphy's sign on exam.  DDx includes biliary disease vs PNA vs PE vs reflux vs other  WBC 14.3 .  CR 1.04.  Lipase wnl. RUQ ultrasound showed gallstones without other stigmata of acute cholecystitis  CT abdomen demonstrated right pulmonary PE, likely acute.  I suspect this correlates with her symptoms.  IV heparin ordered.  CT PE chest ordered to evaluate clot burden.  Based on her prior hospitalization records, I do not suspect she is a candidate for aggressive intervention therapy or tPA.  Discussion with caretaker and sister by phone (son was not reachable) regarding diagnosis of PE.  They are amenable to anticoagulation but do not want heroic measures at this time.  They confirm her DNR/DNI status per her living will at home.  They understand this diagnosis carries a very poor prognosis for Nillie.  At the time of admission she has no signs of shock or hypoxia and is stable for telemetry admission.  CT PE pending.   Clinical Course as of Nov 16 40  Fri Nov 16, 2019  2006  IMPRESSION: 1. Cholelithiasis, without evidence of acute cholecystitis. 2. Right pleural effusion.   [MT]  2300 Patient's caretaker and her daughter were updated by phone.  They both confirm that the patient is DNR/DNI.  They are agreeable to IV heparin and anticoagulation if necessary, but would be interested in having a full risks and benefits conversation re: longterm AC with the hospital team at a later time.  Patient was sent for repeat CT scan to fully visualize the pulmonary arteries to fully visualize clot burden.  IV heparin was ordered.  She is comfortable in the room and remained stable on room air.  Admitted now to Dr Roel Cluck the hospitalist   [MT]    Clinical Course User Index [MT] Langston Masker, Carola Rhine, MD    Final Clinical Impression(s) / ED Diagnoses Final diagnoses:  Biliary colic symptom  Acute pulmonary embolism, unspecified pulmonary  embolism type, unspecified whether acute cor pulmonale present [facility])    Rx / DC Orders ED Discharge Orders    None       Wyvonnia Dusky, MD 11/17/19 [phone]

## 2019-11-17 ENCOUNTER — Inpatient Hospital Stay (HOSPITAL_COMMUNITY): Payer: Medicare Other

## 2019-11-17 DIAGNOSIS — Z9841 Cataract extraction status, right eye: Secondary | ICD-10-CM | POA: Diagnosis not present

## 2019-11-17 DIAGNOSIS — E039 Hypothyroidism, unspecified: Secondary | ICD-10-CM | POA: Diagnosis present

## 2019-11-17 DIAGNOSIS — D72829 Elevated white blood cell count, unspecified: Secondary | ICD-10-CM | POA: Diagnosis present

## 2019-11-17 DIAGNOSIS — I2699 Other pulmonary embolism without acute cor pulmonale: Secondary | ICD-10-CM | POA: Diagnosis present

## 2019-11-17 DIAGNOSIS — Z9842 Cataract extraction status, left eye: Secondary | ICD-10-CM | POA: Diagnosis not present

## 2019-11-17 DIAGNOSIS — Z79890 Hormone replacement therapy (postmenopausal): Secondary | ICD-10-CM | POA: Diagnosis not present

## 2019-11-17 DIAGNOSIS — R531 Weakness: Secondary | ICD-10-CM

## 2019-11-17 DIAGNOSIS — I2609 Other pulmonary embolism with acute cor pulmonale: Secondary | ICD-10-CM

## 2019-11-17 DIAGNOSIS — N184 Chronic kidney disease, stage 4 (severe): Secondary | ICD-10-CM | POA: Diagnosis present

## 2019-11-17 DIAGNOSIS — Z66 Do not resuscitate: Secondary | ICD-10-CM | POA: Diagnosis present

## 2019-11-17 DIAGNOSIS — Z7189 Other specified counseling: Secondary | ICD-10-CM | POA: Diagnosis not present

## 2019-11-17 DIAGNOSIS — Z96612 Presence of left artificial shoulder joint: Secondary | ICD-10-CM | POA: Diagnosis present

## 2019-11-17 DIAGNOSIS — I129 Hypertensive chronic kidney disease with stage 1 through stage 4 chronic kidney disease, or unspecified chronic kidney disease: Secondary | ICD-10-CM | POA: Diagnosis present

## 2019-11-17 DIAGNOSIS — H9190 Unspecified hearing loss, unspecified ear: Secondary | ICD-10-CM | POA: Diagnosis present

## 2019-11-17 DIAGNOSIS — F0390 Unspecified dementia without behavioral disturbance: Secondary | ICD-10-CM

## 2019-11-17 DIAGNOSIS — Z515 Encounter for palliative care: Secondary | ICD-10-CM

## 2019-11-17 DIAGNOSIS — Z20822 Contact with and (suspected) exposure to covid-19: Secondary | ICD-10-CM | POA: Diagnosis present

## 2019-11-17 DIAGNOSIS — I351 Nonrheumatic aortic (valve) insufficiency: Secondary | ICD-10-CM | POA: Diagnosis not present

## 2019-11-17 DIAGNOSIS — Z888 Allergy status to other drugs, medicaments and biological substances status: Secondary | ICD-10-CM | POA: Diagnosis not present

## 2019-11-17 DIAGNOSIS — M6250 Muscle wasting and atrophy, not elsewhere classified, unspecified site: Secondary | ICD-10-CM | POA: Diagnosis present

## 2019-11-17 DIAGNOSIS — Z90710 Acquired absence of both cervix and uterus: Secondary | ICD-10-CM | POA: Diagnosis not present

## 2019-11-17 DIAGNOSIS — K862 Cyst of pancreas: Secondary | ICD-10-CM | POA: Diagnosis present

## 2019-11-17 DIAGNOSIS — I10 Essential (primary) hypertension: Secondary | ICD-10-CM | POA: Diagnosis not present

## 2019-11-17 DIAGNOSIS — Z91013 Allergy to seafood: Secondary | ICD-10-CM | POA: Diagnosis not present

## 2019-11-17 DIAGNOSIS — K219 Gastro-esophageal reflux disease without esophagitis: Secondary | ICD-10-CM | POA: Diagnosis present

## 2019-11-17 DIAGNOSIS — I447 Left bundle-branch block, unspecified: Secondary | ICD-10-CM | POA: Diagnosis present

## 2019-11-17 DIAGNOSIS — Z85820 Personal history of malignant melanoma of skin: Secondary | ICD-10-CM | POA: Diagnosis not present

## 2019-11-17 DIAGNOSIS — Z882 Allergy status to sulfonamides status: Secondary | ICD-10-CM | POA: Diagnosis not present

## 2019-11-17 DIAGNOSIS — I2692 Saddle embolus of pulmonary artery without acute cor pulmonale: Secondary | ICD-10-CM | POA: Diagnosis present

## 2019-11-17 DIAGNOSIS — E785 Hyperlipidemia, unspecified: Secondary | ICD-10-CM | POA: Diagnosis present

## 2019-11-17 DIAGNOSIS — M810 Age-related osteoporosis without current pathological fracture: Secondary | ICD-10-CM | POA: Diagnosis present

## 2019-11-17 LAB — COMPREHENSIVE METABOLIC PANEL
ALT: 15 U/L (ref 0–44)
AST: 19 U/L (ref 15–41)
Albumin: 3.3 g/dL — ABNORMAL LOW (ref 3.5–5.0)
Alkaline Phosphatase: 50 U/L (ref 38–126)
Anion gap: 11 (ref 5–15)
BUN: 25 mg/dL — ABNORMAL HIGH (ref 8–23)
CO2: 22 mmol/L (ref 22–32)
Calcium: 8.7 mg/dL — ABNORMAL LOW (ref 8.9–10.3)
Chloride: 107 mmol/L (ref 98–111)
Creatinine, Ser: 0.9 mg/dL (ref 0.44–1.00)
GFR calc Af Amer: >60 mL/min (ref >60)
GFR calc non Af Amer: 54 mL/min — ABNORMAL LOW (ref >60)
Glucose, Bld: 90 mg/dL (ref 70–99)
Potassium: 4 mmol/L (ref 3.5–5.1)
Sodium: 140 mmol/L (ref 135–145)
Total Bilirubin: 1.1 mg/dL (ref 0.3–1.2)
Total Protein: 6.6 g/dL (ref 6.5–8.1)

## 2019-11-17 LAB — HEPARIN LEVEL (UNFRACTIONATED)
Heparin Unfractionated: 0.8 IU/mL — ABNORMAL HIGH (ref 0.30–0.70)
Heparin Unfractionated: 0.51 IU/mL (ref 0.30–0.70)

## 2019-11-17 LAB — BRAIN NATRIURETIC PEPTIDE: B Natriuretic Peptide: 409.5 pg/mL — ABNORMAL HIGH (ref 0.0–100.0)

## 2019-11-17 LAB — CBC WITH DIFFERENTIAL/PLATELET
Abs Immature Granulocytes: 0.08 10*3/uL — ABNORMAL HIGH (ref 0.00–0.07)
Basophils Absolute: 0.1 10*3/uL (ref 0.0–0.1)
Basophils Relative: 1 %
Eosinophils Absolute: 0.8 10*3/uL — ABNORMAL HIGH (ref 0.0–0.5)
Eosinophils Relative: 6 %
HCT: 38.5 % (ref 36.0–46.0)
Hemoglobin: 12.8 g/dL (ref 12.0–15.0)
Immature Granulocytes: 1 %
Lymphocytes Relative: 14 %
Lymphs Abs: 1.7 10*3/uL (ref 0.7–4.0)
MCH: 33.2 pg (ref 26.0–34.0)
MCHC: 33.2 g/dL (ref 30.0–36.0)
MCV: 99.7 fL (ref 80.0–100.0)
Monocytes Absolute: 1.5 10*3/uL — ABNORMAL HIGH (ref 0.1–1.0)
Monocytes Relative: 12 %
Neutro Abs: 8.4 10*3/uL — ABNORMAL HIGH (ref 1.7–7.7)
Neutrophils Relative %: 66 %
Platelets: 276 10*3/uL (ref 150–400)
RBC: 3.86 MIL/uL — ABNORMAL LOW (ref 3.87–5.11)
RDW: 13.6 % (ref 11.5–15.5)
WBC: 12.6 10*3/uL — ABNORMAL HIGH (ref 4.0–10.5)
nRBC: 0 % (ref 0.0–0.2)

## 2019-11-17 LAB — ECHOCARDIOGRAM COMPLETE
Area-P 1/2: 2.69 cm2
Height: 60 in
P 1/2 time: 717 msec
S' Lateral: 1.7 cm
Weight: 1673.59 oz

## 2019-11-17 LAB — URINALYSIS, ROUTINE W REFLEX MICROSCOPIC
Bacteria, UA: NONE SEEN
Bilirubin Urine: NEGATIVE
Glucose, UA: NEGATIVE mg/dL
Ketones, ur: 5 mg/dL — AB
Nitrite: NEGATIVE
Protein, ur: 30 mg/dL — AB
Specific Gravity, Urine: 1.045 — ABNORMAL HIGH (ref 1.005–1.030)
pH: 6 (ref 5.0–8.0)

## 2019-11-17 LAB — CBC
HCT: 37 % (ref 36.0–46.0)
Hemoglobin: 12.4 g/dL (ref 12.0–15.0)
MCH: 33.2 pg (ref 26.0–34.0)
MCHC: 33.5 g/dL (ref 30.0–36.0)
MCV: 98.9 fL (ref 80.0–100.0)
Platelets: 265 10*3/uL (ref 150–400)
RBC: 3.74 MIL/uL — ABNORMAL LOW (ref 3.87–5.11)
RDW: 13.5 % (ref 11.5–15.5)
WBC: 12.9 10*3/uL — ABNORMAL HIGH (ref 4.0–10.5)
nRBC: 0 % (ref 0.0–0.2)

## 2019-11-17 LAB — SARS CORONAVIRUS 2 BY RT PCR (HOSPITAL ORDER, PERFORMED IN ~~LOC~~ HOSPITAL LAB): SARS Coronavirus 2: NEGATIVE

## 2019-11-17 LAB — TSH: TSH: 1.833 u[IU]/mL (ref 0.350–4.500)

## 2019-11-17 LAB — TROPONIN I (HIGH SENSITIVITY)
Troponin I (High Sensitivity): 67 ng/L — ABNORMAL HIGH (ref <18)
Troponin I (High Sensitivity): 73 ng/L — ABNORMAL HIGH (ref <18)

## 2019-11-17 LAB — MAGNESIUM: Magnesium: 1.9 mg/dL (ref 1.7–2.4)

## 2019-11-17 LAB — PHOSPHORUS: Phosphorus: 2.7 mg/dL (ref 2.5–4.6)

## 2019-11-17 MED ORDER — LEVOTHYROXINE SODIUM 75 MCG PO TABS
75.0000 ug | ORAL_TABLET | Freq: Every day | ORAL | Status: DC
  Administered 2019-11-17 – 2019-11-19 (×3): 75 ug via ORAL
  Filled 2019-11-17 (×3): qty 1

## 2019-11-17 MED ORDER — TRAMADOL HCL 50 MG PO TABS: 50.0000 mg | ORAL_TABLET | Freq: Three times a day (TID) | ORAL | Status: DC | PRN

## 2019-11-17 MED ORDER — DARIFENACIN HYDROBROMIDE ER 7.5 MG PO TB24
7.5000 mg | ORAL_TABLET | Freq: Every day | ORAL | Status: DC
  Administered 2019-11-17 – 2019-11-19 (×3): 7.5 mg via ORAL
  Filled 2019-11-17 (×4): qty 1

## 2019-11-17 MED ORDER — HEPARIN (PORCINE) 25000 UT/250ML-% IV SOLN: 650.0000 [IU]/h | INTRAVENOUS | Status: DC

## 2019-11-17 MED ORDER — ONDANSETRON HCL 4 MG/2ML IJ SOLN: 4.0000 mg | Freq: Four times a day (QID) | INTRAMUSCULAR | Status: DC | PRN

## 2019-11-17 MED ORDER — DOCUSATE SODIUM 100 MG PO CAPS
100.0000 mg | ORAL_CAPSULE | Freq: Two times a day (BID) | ORAL | Status: DC
  Administered 2019-11-17 – 2019-11-19 (×3): 100 mg via ORAL
  Filled 2019-11-17 (×3): qty 1

## 2019-11-17 MED ORDER — AMLODIPINE BESYLATE 5 MG PO TABS
5.0000 mg | ORAL_TABLET | Freq: Every day | ORAL | Status: DC
  Administered 2019-11-18 – 2019-11-19 (×2): 5 mg via ORAL
  Filled 2019-11-17 (×2): qty 1

## 2019-11-17 MED ORDER — ACETAMINOPHEN 650 MG RE SUPP: 650.0000 mg | Freq: Four times a day (QID) | RECTAL | Status: DC | PRN

## 2019-11-17 MED ORDER — ACETAMINOPHEN 325 MG PO TABS
650.0000 mg | ORAL_TABLET | Freq: Four times a day (QID) | ORAL | Status: DC | PRN
  Administered 2019-11-18: 650 mg via ORAL
  Filled 2019-11-17: qty 2

## 2019-11-17 MED ORDER — SODIUM CHLORIDE 0.9% FLUSH
3.0000 mL | Freq: Two times a day (BID) | INTRAVENOUS | Status: DC
  Administered 2019-11-17: 3 mL via INTRAVENOUS

## 2019-11-17 MED ORDER — SODIUM CHLORIDE 0.9 % IV SOLN: INTRAVENOUS | Status: AC

## 2019-11-17 MED ORDER — SIMVASTATIN 20 MG PO TABS: 40.0000 mg | ORAL_TABLET | Freq: Every evening | ORAL | Status: DC

## 2019-11-17 MED ORDER — SIMVASTATIN 20 MG PO TABS
20.0000 mg | ORAL_TABLET | Freq: Every evening | ORAL | Status: DC
  Administered 2019-11-17 – 2019-11-18 (×2): 20 mg via ORAL
  Filled 2019-11-17 (×2): qty 1

## 2019-11-17 MED ORDER — ASPIRIN 81 MG PO CHEW
81.0000 mg | CHEWABLE_TABLET | Freq: Every day | ORAL | Status: DC
  Administered 2019-11-17 – 2019-11-19 (×3): 81 mg via ORAL
  Filled 2019-11-17 (×3): qty 1

## 2019-11-17 MED ORDER — HEPARIN (PORCINE) 25000 UT/250ML-% IV SOLN
650.0000 [IU]/h | INTRAVENOUS | Status: DC
  Administered 2019-11-18: 650 [IU]/h via INTRAVENOUS
  Filled 2019-11-17: qty 250

## 2019-11-17 MED ORDER — AMLODIPINE BESYLATE 5 MG PO TABS
ORAL_TABLET | ORAL | Status: AC
  Administered 2019-11-17: 5 mg
  Filled 2019-11-17: qty 1

## 2019-11-17 MED ORDER — TRAMADOL HCL 50 MG PO TABS: 50.0000 mg | ORAL_TABLET | Freq: Two times a day (BID) | ORAL | Status: DC | PRN

## 2019-11-17 MED ORDER — PERFLUTREN LIPID MICROSPHERE
1.0000 mL | INTRAVENOUS | Status: AC | PRN
  Administered 2019-11-17: 2 mL via INTRAVENOUS
  Filled 2019-11-17: qty 10

## 2019-11-17 MED ORDER — MIRABEGRON ER 25 MG PO TB24
50.0000 mg | ORAL_TABLET | Freq: Every day | ORAL | Status: DC
  Administered 2019-11-17 – 2019-11-19 (×3): 50 mg via ORAL
  Filled 2019-11-17 (×4): qty 2

## 2019-11-17 MED ORDER — ONDANSETRON HCL 4 MG PO TABS: 4.0000 mg | ORAL_TABLET | Freq: Four times a day (QID) | ORAL | Status: DC | PRN

## 2019-11-17 MED ORDER — HYDROCODONE-ACETAMINOPHEN 5-325 MG PO TABS
1.0000 | ORAL_TABLET | ORAL | Status: DC | PRN
  Administered 2019-11-17: 1 via ORAL
  Filled 2019-11-17: qty 1

## 2019-11-17 MED ORDER — HYDRALAZINE HCL 20 MG/ML IJ SOLN
10.0000 mg | Freq: Four times a day (QID) | INTRAMUSCULAR | Status: DC | PRN
  Administered 2019-11-17 – 2019-11-18 (×2): 10 mg via INTRAVENOUS
  Filled 2019-11-17 (×2): qty 1

## 2019-11-17 NOTE — Progress Notes (Signed)
PROGRESS NOTE    Patient: Cindy [name]                            PCP: Lorne Skeens, MD                    DOB: 1922-10-08            DOA: 11/16/2019 IEP:[id number]             DOS: 11/17/2019, 7:30 AM   LOS: 0 days   Date of Service: The patient was seen and examined on 11/17/2019  Subjective:   The patient was seen and examined this Am. Hemodynamically stable.   Shortness of breath denies any chest pain   Brief Narrative:  Per HPI:  Cindy [name] is a 86 y.o. female with medical history significant of CAD, chronic renal insufficiency stage 4, chronic left bundle branch block, dementia, arthritis, HLD, GERD, hypothyroidism. Presented with  RUQ pain and Chest pain  She had had decreased p.o. intake.  Reports sharp pain in her right upper quadrant. Has   been vaccinated against COVID  Initial COVID TEST   ER: Initially was worked up for gallstone disease which was unremarkable US showed no cholecystitis CT abd showed incidental Right sided PE.  Subsequently admitted for observation, started on heparin drip.   Assessment & Plan:   Active Problems:   Left bundle branch block   Essential hypertension   Hyperlipidemia   CRI (chronic renal insufficiency), stage 4 (severe) (HCC)   GERD (gastroesophageal reflux disease)   Pulmonary embolism (HCC)   Dementia (HCC)   Hypothyroidism   Pancreatic mass     Pulmonary embolism (HCC)  -We will continue to monitor on  Telemetry  -Continue heparin drip for today anticipating transition to oral anticoagulation -Pulmonary critical care team consulted, following closely, recommending no direct lytic versus full dose lytic only if patient decompensates  -Mildly elevated troponin recycling, monitoring closely 67, 73, -2D Echocardiogram >> -Bilateral extremity Doppler study >>   Most likely risk factors for hypercoagulable state being  sedentary lifestyle  unknown  large PE but no evidence of decompensation, or right heart strain   CKD stage 4 -Monitoring BUN/creatinine closely, currently stable Baseline creatinine around 1 -Creatinine 1.04 >>> 0.90 BUN 29, 25,  Continue to monitor this could be actually due to muscle wasting.   Patient received IV contrast for her studies.   -We will continue gentle IV hydration  Essential hypertension/accelerated hypertension -We will allow for mild hypertensive state -No new blood pressure medication, as needed hydralazine, small dose amlodipine -Avoiding hypotensive state, in the presence of PE  . GERD (gastroesophageal reflux disease) stable continue home meds  . Hyperlipidemia -chronic stable continue Zocor  . Left bundle branch block -chronic stable  . Dementia ([place]) -chronic stable continue to monitor for any signs of sundowning  . Hypothyroidism - - Check TSH continue home medications at current dose   Nutritional status:          Consultants: Pulmonary critical care team    ----------------------------------------------------------------------------------------------------------------------------  DVT prophylaxis: On heparin drip  Code Status:   Code Status: DNR Family Communication: No family member present at bedside- attempt will be made to update --- at this point were not able to get a hold of family members. The above findings and plan of care has been discussed with patient in detail,  they expressed understanding and agreement of above. -Advance care planning  has been discussed.   Admission status:    Status is: Observation  The patient remains OBS appropriate and will d/c before 2 midnights.  Dispo: The patient is from: Home 24-hour with caregiver              Anticipated d/c is to: Home with caregiver              Anticipated d/c date is: 3 days              Patient currently is not medically stable to d/c.        Procedures:   No admission procedures for hospital encounter.     Antimicrobials:  Anti-infectives  (From admission, onward)   None       Medication:  . aspirin  81 mg Oral Daily  . darifenacin  7.5 mg Oral Daily  . docusate sodium  100 mg Oral BID  . levothyroxine  75 mcg Oral QAC breakfast  . mirabegron ER  50 mg Oral Daily  . simvastatin  40 mg Oral QPM  . sodium chloride (PF)      . sodium chloride (PF)      . sodium chloride flush  3 mL Intravenous Once  . sodium chloride flush  3 mL Intravenous Q12H  . traMADol  50 mg Oral Once    acetaminophen **OR** acetaminophen, HYDROcodone-acetaminophen, ondansetron **OR** ondansetron (ZOFRAN) IV, traMADol   Objective:   Vitals:   11/17/19 0300 11/17/19 0336 11/17/19 0530 11/17/19 0609  BP: (!) 175/71 (!) 191/75 (!) 186/77 (!) 186/77  Pulse: (!) 55 61 64 (!) 59  Resp: 14 (!) 28 16 (!) 22  Temp:      TempSrc:      SpO2: 94% 92% 93% 93%  Weight:      Height:       No intake or output data in the 24 hours ending 11/17/19 0730 Filed Weights   11/16/19 1418  Weight: 47.4 kg     Examination:   Physical Exam  Constitution: Awake alert oriented x2 Psychiatric: Normal and stable mood and affect, cognition intact,   HEENT: Normocephalic, PERRL, otherwise with in Normal limits  Chest:Chest symmetric Cardio vascular:  S1/S2, RRR, No murmure, No Rubs or Gallops  pulmonary: Clear to auscultation bilaterally, respirations unlabored, negative wheezes / crackles Abdomen: Soft, non-tender, non-distended, bowel sounds,no masses, no organomegaly Muscular skeletal: Limited exam - in bed, able to move all 4 extremities, Normal strength,  Neuro: CNII-XII intact. , normal motor and sensation, reflexes intact  Extremities: No pitting edema lower extremities, +2 pulses  Skin: Dry, warm to touch, negative for any Rashes, No open wounds Wounds: per nursing documentation Pressure Injury 06/13/18 Stage I -  Intact skin with non-blanchable redness of a localized area usually over a bony prominence. Redness with indentation (Active)  06/13/18  2031  Location: Sacrum  Location Orientation: Lower  Staging: Stage I -  Intact skin with non-blanchable redness of a localized area usually over a bony prominence.  Wound Description (Comments): Redness with indentation (Patient states past surgery)  Present on Admission: Yes    ------------------------------------------------------------------------------------------------------------------------------------------    LABs:  CBC Latest Ref Rng & Units 11/17/2019 11/16/2019 03/07/2019  WBC 4.0 - 10.5 K/uL 12.6(H) 14.3(H) 6.5  Hemoglobin 12.0 - 15.0 g/dL 12.8 13.3 11.1(L)  Hematocrit 36 - 46 % 38.5 40.8 35.4(L)  Platelets 150 - 400 K/uL 276 264 321   CMP Latest Ref Rng & Units 11/17/2019 11/16/2019 03/07/2019  Glucose 70 -  99 mg/dL 90 117(H) 83  BUN 8 - 23 mg/dL 25(H) 29(H) 74(H)  Creatinine 0.44 - 1.00 mg/dL 0.90 1.04(H) 2.18(H)  Sodium 135 - 145 mmol/L 140 142 139  Potassium 3.5 - 5.1 mmol/L 4.0 4.1 3.0(L)  Chloride 98 - 111 mmol/L 107 108 108  CO2 22 - 32 mmol/L 22 24 20(L)  Calcium 8.9 - 10.3 mg/dL 8.7(L) 9.0 8.1(L)  Total Protein 6.5 - 8.1 g/dL 6.6 7.1 -  Total Bilirubin 0.3 - 1.2 mg/dL 1.1 0.8 -  Alkaline Phos 38 - 126 U/L 50 53 -  AST 15 - 41 U/L 19 15 -  ALT 0 - 44 U/L 15 14 -       Micro Results Recent Results (from the past 240 hour(s))  SARS Coronavirus 2 by RT PCR (hospital order, performed in [facility] hospital lab) Nasopharyngeal Nasopharyngeal Swab     Status: None   Collection Time: 11/16/19 10:59 PM   Specimen: Nasopharyngeal Swab  Result Value Ref Range Status   SARS Coronavirus 2 NEGATIVE NEGATIVE Final    Comment: (NOTE) SARS-CoV-2 target nucleic acids are NOT DETECTED.  The SARS-CoV-2 RNA is generally detectable in upper and lower respiratory specimens during the acute phase of infection. The lowest concentration of SARS-CoV-2 viral copies this assay can detect is 250 copies / mL. A negative result does not preclude SARS-CoV-2 infection and should not  be used as the sole basis for treatment or other patient management decisions.  A negative result may occur with improper specimen collection / handling, submission of specimen other than nasopharyngeal swab, presence of viral mutation(s) within the areas targeted by this assay, and inadequate number of viral copies (<250 copies / mL). A negative result must be combined with clinical observations, patient history, and epidemiological information.  Fact Sheet for Patients:   StrictlyIdeas.no  Fact Sheet for Healthcare Providers: BankingDealers.co.za  This test is not yet approved or  cleared by the [place] FDA and has been authorized for detection and/or diagnosis of SARS-CoV-2 by FDA under an Emergency Use Authorization (EUA).  This EUA will remain in effect (meaning this test can be used) for the duration of the COVID-19 declaration under Section 564(b)(1) of the Act, 21 U.S.C. section 360bbb-3(b)(1), unless the authorization is terminated or revoked sooner.  Performed at [facility], [place] [address], [place], [place] [address]     Radiology Reports CT Angio Chest PE W and/or Wo Contrast  Result Date: 11/16/2019 CLINICAL DATA:  Possible PE on CT abdomen pelvis, right upper quadrant pain EXAM: CT ANGIOGRAPHY CHEST WITH CONTRAST TECHNIQUE: Multidetector CT imaging of the chest was performed using the standard protocol during bolus administration of intravenous contrast. Multiplanar CT image reconstructions and MIPs were obtained to evaluate the vascular anatomy. CONTRAST:  56 mL OMNIPAQUE IOHEXOL 350 MG/ML SOLN COMPARISON:  CT 11/16/2019, chest CT 08/04/2011 FINDINGS: Cardiovascular: Satisfactory opacification of the pulmonary arteries to the segmental level. Acute thrombus within right upper lobe segmental pulmonary vessels with evidence of saddle embolus extending from right upper lobe pulmonary vessels to the right  descending pulmonary artery and right lower lobe pulmonary arteries. Occlusive appearing thrombus within right middle lobe pulmonary vessels. Extension of embolus into right lower lobe segmental pulmonary vessels. Suspected moderate thrombus along the posterior wall of the left pulmonary artery, with extension of thrombus into left descending lobar and left lower lobe segmental vessels. RV LV ratio of 1.7. Nonaneurysmal aorta. Moderate aortic atherosclerosis. Coronary vascular calcification. Borderline cardiomegaly. No pericardial  effusion. Mediastinum/Nodes: Midline trachea. No thyroid mass. Borderline left paratracheal lymph node measuring 12 mm. Esophagus within normal limits. Lungs/Pleura: Trace bilateral pleural effusions. Bronchiectasis within the right upper lobe and left greater than right lower lobes. Chronic consolidation in the left lower lobe with slight increased superimposed airspace disease. Chronic elevation of left diaphragm. 14 mm right middle lobe pulmonary nodule, series 7, image number 67. Upper Abdomen: Herniation of most of the stomach, in addition to colon and mesentery into the left lower chest as before. Musculoskeletal: Diffuse degenerative changes. No acute or suspicious osseous abnormality. Review of the MIP images confirms the above findings. IMPRESSION: 1. Positive for acute bilateral pulmonary emboli including saddle embolus on the right. Positive for acute PE with CTevidence of right heart strain (RV/LV Ratio = 1.7) consistent with at least submassive (intermediate risk) PE. The presence of right heart strain has been associated with an increased risk of morbidity and mortality. 2. Chronic elevation of the left diaphragm with large left-sided hernia. Chronic consolidation in the left lung base likely due to atelectasis, there is slightly increased airspace disease at the left base and a component of superimposed infarct is difficult to exclude. There is left greater than right lower  lobe bronchiectasis which is also chronic. 3. Small bilateral right greater than left pleural effusion. 4. 14 mm right middle lobe pulmonary nodule, slight increase in size compared with 2013 study at which time the nodule measured 11 mm. Critical test result of pulmonary embolus previously called to Dr. Langston Masker at the time of previously reported CT abdomen and pelvis. Aortic Atherosclerosis (ICD10-I70.0). Electronically Signed   By: Donavan Foil M.D.   On: 11/16/2019 23:33   CT ABDOMEN PELVIS W CONTRAST  Result Date: 11/16/2019 CLINICAL DATA:  Abdominal pain with leukocytosis EXAM: CT ABDOMEN AND PELVIS WITH CONTRAST TECHNIQUE: Multidetector CT imaging of the abdomen and pelvis was performed using the standard protocol following bolus administration of intravenous contrast. CONTRAST:  71mL OMNIPAQUE IOHEXOL 300 MG/ML  SOLN COMPARISON:  CT 03/05/2019, 02/18/2019, 11/08/2016 FINDINGS: Lower chest: Small right-sided pleural effusion 14 mm right middle lobe pulmonary nodule near the hilum, series 6, image number 8, previously 13 mm. Large left-sided hernia containing stomach, mesentery and colon. Incompletely visualized filling defects within the distal right pulmonary artery, extending into the right lower lobe pulmonary arteries consistent with acute pulmonary embolus. Hepatobiliary: No focal liver abnormality is seen. Punctate stones within the gallbladder. No biliary dilatation. Pancreas: Atrophic. No inflammatory change. 1.6 x 2.5 cm cystic lesion near the uncinate process of the pancreas. Spleen: Normal in size without focal abnormality. Adrenals/Urinary Tract: Adrenal glands are normal. There are cysts within the bilateral kidneys with additional subcentimeter hypodense lesions too small to further characterize. Urinary bladder is unremarkable Stomach/Bowel: Stomach herniated into the left chest. No dilated small bowel. No bowel wall thickening. Negative appendix. Descending and sigmoid colon diverticular  disease without acute inflammatory change. Vascular/Lymphatic: Moderate severe aortic atherosclerosis. No aneurysm. No suspicious nodes. Reproductive: Status post hysterectomy. No adnexal masses. Other: Negative for free air or free fluid. Musculoskeletal: Degenerative changes of the lumbar spine. Anterolisthesis L4 on L5, L3 on L4 with retrolisthesis L2 on L1. No acute osseous abnormality. IMPRESSION: 1. Incompletely visualized filling defects within the distal right pulmonary artery, extending into the right lower lobe pulmonary arteries consistent with acute pulmonary embolus. 2. Small right-sided pleural effusion. 14 mm right middle lobe pulmonary nodule, previously 13 mm. 3. Large left-sided hernia containing stomach, mesentery, and colon. No evidence for bowel  obstruction. 4. Colonic diverticular disease without acute inflammatory change. 5. Cholelithiasis. 6. 2.5 cm cystic lesion near the uncinate process of the pancreas, intermittently visualized, slowly enlarging over time. Recommend clinical management given patient age. Critical Value/emergent results were called by telephone at the time of interpretation on 11/16/2019 at 10:23 pm to provider MATTHEW TRIFAN , who verbally acknowledged these results. Aortic Atherosclerosis (ICD10-I70.0). Electronically Signed   By: Donavan Foil M.D.   On: 11/16/2019 22:23   US Abdomen Limited RUQ  Result Date: 11/16/2019 CLINICAL DATA:  Biliary colic. EXAM: ULTRASOUND ABDOMEN LIMITED RIGHT UPPER QUADRANT COMPARISON:  None. FINDINGS: Gallbladder: Nonmobile, shadowing echogenic gallstones are seen within the gallbladder fundus. There is no evidence of gallbladder wall thickening (2 mm). No sonographic Murphy sign noted by sonographer. Common bile duct: Diameter: 2.5 mm Liver: No focal lesion identified. Within normal limits in parenchymal echogenicity. Portal vein is patent on color Doppler imaging with normal direction of blood flow towards the liver. Other: Of incidental  note is the presence of a right-sided pleural effusion. IMPRESSION: 1. Cholelithiasis, without evidence of acute cholecystitis. 2. Right pleural effusion. Electronically Signed   By: Virgina Norfolk M.D.   On: 11/16/2019 19:53    SIGNED: Deatra James, MD, FACP, FHM. [facility],  Pager (please use [email or web address] to page/text)  If 7PM-7AM, please contact night-coverage [email or web address], Password [facility] 11/17/2019, 7:30 AM

## 2019-11-17 NOTE — ED Notes (Signed)
DNR sticker placed on pt armband.

## 2019-11-17 NOTE — Progress Notes (Addendum)
ANTICOAGULATION CONSULT NOTE - Initial Consult  Pharmacy Consult for Heparin Indication: pulmonary embolus  Allergies  Allergen Reactions  . Fish Allergy Hives  . Diclofenac Other (See Comments)  . Sulfa Antibiotics     Unknown reaction    Patient Measurements: Height: 5' (152.4 cm) Weight: 47.4 kg (104 lb 9.6 oz) IBW/kg (Calculated) : 45.5 Heparin Dosing Weight: n/a. Use TBW = 47.4 kg  Vital Signs: BP: 184/113 (08/07 0734) Pulse Rate: 65 (08/07 0804)  Labs: Recent Labs    11/16/19 1433 11/16/19 1433 11/16/19 2239 11/17/19 0500 11/17/19 0756  HGB 13.3   < >  --  12.8 12.4  HCT 40.8  --   --  38.5 37.0  PLT 264  --   --  276 265  APTT  --   --  31  --   --   LABPROT  --   --  13.4  --   --   INR  --   --  1.1  --   --   HEPARINUNFRC  --   --   --   --  0.80*  CREATININE 1.04*  --   --  0.90  --   TROPONINIHS  --   --   --  67* 73*   < > = values in this interval not displayed.    Estimated Creatinine Clearance: 26.9 mL/min (by C-G formula based on SCr of 0.9 mg/dL).   Medical History: Past Medical History:  Diagnosis Date  . Arthritis   . Chronic renal insufficiency, stage III (moderate)   . Depression   . Dyslipidemia   . Forgetfulness   . GERD (gastroesophageal reflux disease)    takes Omeprazole for Hital Hernia   . Hiatal hernia   . History of nuclear stress test 2005   persantine; inferoapical ischmeia w/ EF 83%; no CAD at cath  . HOH (hard of hearing)    hearing aids  . Hypertension   . Hypothyroidism   . LBBB (left bundle branch block)    chronic per Dr. Gwenlyn Found  . Lung nodule 07/01/03   right middle lobe  . Macular degeneration   . Malignant melanoma ([place]) 11/12/04   L upper arm, MOHS_ right hand, leg  . Murmur, cardiac   . Osteoporosis   . Pneumonia 06/2018    Medications:  No oral anticoagulation PTA  Assessment:  87 yr female presents with c/o right upper abdominal pain. Patient with known gallstones  CT = + acute bilateral PE  including saddle embolus on the right. CT evidence right heart strain  Baseline labs: -Hgb 13.3, Plt 264, INR 1.1  Today, 11/17/19  HL = 0.80 is SUPRAtherapeutic on heparin infusion of 750 units/hr  CBC: WNL & stable  Confirmed with RN that heparin infusing at correct rate. No issues with infusion. No signs/symptoms of bleeding.  Goal of Therapy:  Heparin level 0.3-0.7 units/ml Monitor platelets by anticoagulation protocol: Yes   Plan:   Decrease heparin infusion to 650 units/hr  Check 8 hour HL  Follow daily heparin level and CBC  Monitor for signs/symptoms of bleeding  Lenis Noon, PharmD 11/17/2019,9:22 AM  Addendum: Evening Anticoagulation Follow Up  Assessment:  HL = 0.51 is therapeutic on current infusion of 650 units/hr  Confirmed with RN that heparin infusing at correct rate. No interruptions in infusion. No signs or symptoms of bleeding.   Goal: HL 0.3 - 0.7  Plan:  Continue heparin infusion at current rate of 650 units/hr  Check confirmatory HL with AM labs tomorrow  CBC, HL daily  Lenis Noon, PharmD 11/17/19 8:10 PM

## 2019-11-17 NOTE — Progress Notes (Signed)
  Echocardiogram 2D Echocardiogram has been performed.  Jennette Dubin 11/17/2019, 2:35 PM

## 2019-11-17 NOTE — Consult Note (Signed)
Consultation Note Date: 11/17/2019   Patient Name: Cindy [name]  DOB: 27-Jan-1923  MRN: [id number]  Age / Sex: 86 y.o., female  PCP: Altheimer, Legrand Como, MD Referring Physician: Deatra James, MD  Reason for Consultation: Establishing goals of care  HPI/Patient Profile: 86 y.o. female   admitted on 11/16/2019   Clinical Assessment and Goals of Care:  86 yo lady who lives at home by herself, she has a caregiver [name], her husband died 2 years ago. She had 3 kids, one child is deceased, has a son who lives out of town and a daughter who resides in a local nursing facility. Patient arrived with abdominal pain, has been diagnosed with sub massive PE, admitted to [facility] service, has been seen by PCCM and is on anti coagulation.   Palliative consult for GOC.   [name] is awake alert resting in bed, she states she is in the hospital because something is wrong with her heart. She is oriented and provides all of her history. She denies chest pain, denies shortness of breath.   Palliative medicine is specialized medical care for people living with serious illness. It focuses on providing relief from the symptoms and stress of a serious illness. The goal is to improve quality of life for both the patient and the family.  Goals of care: Broad aims of medical therapy in relation to the patient's values and preferences. Our aim is to provide medical care aimed at enabling patients to achieve the goals that matter most to them, given the circumstances of their particular medical situation and their constraints.   Discussed with patient about her current condition, current scope of treatment. Briefly discussed about home based palliative support, she is in agreement. Brief life review also performed, patient has cane and walker but is very independent and is able to handle all of her activities of daily living.   See below.    NEXT OF KIN  caregiver, son and daughter.   SUMMARY OF RECOMMENDATIONS    Agree with DNR Continue current mode of care Recommend home based palliative support on discharge.   Code Status/Advance Care Planning:  DNR    Symptom Management:    as above.   Palliative Prophylaxis:   Delirium Protocol   Psycho-social/Spiritual:   Desire for further Chaplaincy support:yes  Additional Recommendations: Caregiving  Support/Resources  Prognosis:   Unable to determine  Discharge Planning: To Be Determined      Primary Diagnoses: Present on Admission: . Pulmonary embolism ([place]) . CRI (chronic renal insufficiency), stage 4 (severe) (HCC) . Essential hypertension . GERD (gastroesophageal reflux disease) . Hyperlipidemia . Left bundle branch block . Dementia ([place]) . Hypothyroidism . Pancreatic mass . Pulmonary emboli ([place])   I have reviewed the medical record, interviewed the patient and family, and examined the patient. The following aspects are pertinent.  Past Medical History:  Diagnosis Date  . Arthritis   . Chronic renal insufficiency, stage III (moderate)   . Depression   . Dyslipidemia   . Forgetfulness   .  GERD (gastroesophageal reflux disease)    takes Omeprazole for Hital Hernia   . Hiatal hernia   . History of nuclear stress test 2005   persantine; inferoapical ischmeia w/ EF 83%; no CAD at cath  . HOH (hard of hearing)    hearing aids  . Hypertension   . Hypothyroidism   . LBBB (left bundle branch block)    chronic per Dr. Gwenlyn Found  . Lung nodule 07/01/03   right middle lobe  . Macular degeneration   . Malignant melanoma ([place]) 11/12/04   L upper arm, MOHS_ right hand, leg  . Murmur, cardiac   . Osteoporosis   . Pneumonia 06/2018   Social History   Socioeconomic History  . Marital status: Widowed    Spouse name: Not on file  . Number of children: Not on file  . Years of education: Not on file  . Highest education level: Not on file   Occupational History  . Occupation: Retired  Tobacco Use  . Smoking status: Never Smoker  . Smokeless tobacco: Never Used  Vaping Use  . Vaping Use: Never used  Substance and Sexual Activity  . Alcohol use: No  . Drug use: No  . Sexual activity: Not on file  Other Topics Concern  . Not on file  Social History Narrative   Has a caregiver daily   Social Determinants of Health   Financial Resource Strain:   . Difficulty of Paying Living Expenses:   Food Insecurity:   . Worried About Charity fundraiser in the Last Year:   . Arboriculturist in the Last Year:   Transportation Needs:   . Film/video editor (Medical):   Marland Kitchen Lack of Transportation (Non-Medical):   Physical Activity:   . Days of Exercise per Week:   . Minutes of Exercise per Session:   Stress:   . Feeling of Stress :   Social Connections:   . Frequency of Communication with Friends and Family:   . Frequency of Social Gatherings with Friends and Family:   . Attends Religious Services:   . Active Member of Clubs or Organizations:   . Attends Archivist Meetings:   Marland Kitchen Marital Status:    Family History  Problem Relation Age of Onset  . Cancer Father        NHL  . Cancer Maternal Grandmother        rectal  . Cancer Daughter        breast  . Cancer Maternal Aunt        breast  . Cancer Son        melanoma   Scheduled Meds: . amLODipine  5 mg Oral Daily  . aspirin  81 mg Oral Daily  . darifenacin  7.5 mg Oral Daily  . docusate sodium  100 mg Oral BID  . levothyroxine  75 mcg Oral QAC breakfast  . mirabegron ER  50 mg Oral Daily  . simvastatin  20 mg Oral QPM  . sodium chloride flush  3 mL Intravenous Once  . sodium chloride flush  3 mL Intravenous Q12H   Continuous Infusions: . heparin 650 Units/hr (11/17/19 1000)   PRN Meds:.acetaminophen **OR** acetaminophen, hydrALAZINE, HYDROcodone-acetaminophen, ondansetron **OR** ondansetron (ZOFRAN) IV, traMADol Medications Prior to Admission:   Prior to Admission medications   Medication Sig Start Date End Date Taking? Authorizing Provider  acetaminophen (TYLENOL) 500 MG tablet Take 500 mg by mouth in the morning, at noon, and at bedtime.  Yes [provider]  aspirin 81 MG tablet Take 81 mg by mouth daily.   Yes [provider]  cetirizine (ZYRTEC) 10 MG tablet Take 10 mg by mouth daily.   Yes [provider]  ciprofloxacin (CIPRO) 250 MG tablet Take 250 mg by mouth as directed. Take PRN for Kidney Infection   Yes [provider]  clobetasol (TEMOVATE) 0.05 % external solution Apply 1 application topically 2 (two) times a week.  04/21/18  Yes [provider]  CRANBERRY PO Take 1 tablet by mouth daily.    Yes [provider]  fluocinolone (SYNALAR) 0.025 % cream Apply 1 application topically 2 (two) times daily.   Yes [provider]  glucosamine-chondroitin 500-400 MG tablet Take 1 tablet by mouth daily.   Yes [provider]  hydrocortisone 2.5 % cream Apply 1 application topically 2 (two) times daily as needed (dry skin).  02/23/18  Yes [provider]  hydroxypropyl methylcellulose / hypromellose (ISOPTO TEARS / GONIOVISC) 2.5 % ophthalmic solution Place 1 drop into both eyes daily.   Yes [provider]  ipratropium (ATROVENT) 0.06 % nasal spray Place 1-2 sprays into both nostrils every 6 (six) hours as needed (rhinorrhea).   Yes [provider]  ketoconazole (NIZORAL) 2 % shampoo Apply 1 application topically 3 (three) times a week.  03/29/18  Yes [provider]  levothyroxine (SYNTHROID) 75 MCG tablet Take 75 mcg by mouth daily before breakfast.  07/01/16  Yes [provider]  mirabegron ER (MYRBETRIQ) 50 MG TB24 tablet Take 50 mg by mouth daily.  03/01/17  Yes [provider]  Multiple Vitamins-Minerals (HAIR SKIN AND NAILS FORMULA PO) Take 3 tablets by mouth daily.    Yes [provider]  Multiple  Vitamins-Minerals (OCUVITE EXTRA) TABS Take 1 tablet by mouth daily.   Yes [provider]  nitrofurantoin (MACRODANTIN) 100 MG capsule Take 100 mg by mouth as directed. Take PRN for Kidney Infection   Yes [provider]  Omega-3 1000 MG CAPS Take 1,000 mg by mouth 2 (two) times a day.   Yes [provider]  omega-3 acid ethyl esters (LOVAZA) 1 g capsule Take 1 g by mouth 2 (two) times daily.   Yes [provider]  omeprazole (PRILOSEC) 20 MG capsule Take 20 mg by mouth daily.   Yes [provider]  polyethylene glycol (MIRALAX) 17 g packet Take 17 g by mouth daily. 03/07/19  Yes Shelly Coss, MD  simvastatin (ZOCOR) 40 MG tablet Take 40 mg by mouth every evening.    Yes [provider]  solifenacin (VESICARE) 10 MG tablet Take 10 mg by mouth daily. 01/26/19  Yes [provider]  traMADol (ULTRAM) 50 MG tablet Take 50 mg by mouth every 8 (eight) hours as needed for moderate pain or severe pain.  09/12/18  Yes [provider]  Triamcinolone Acetonide 0.025 % LOTN Apply 1 application topically 2 (two) times daily as needed (scalp).  08/24/19  Yes [provider]  triamcinolone cream (KENALOG) 0.1 % Apply 1 application topically 2 (two) times daily.   Yes [provider]  acetaminophen (TYLENOL) 325 MG tablet Take 1-2 tablets (325-650 mg total) by mouth every 6 (six) hours as needed for mild pain or moderate pain (pain score 1-3 or temp > 100.5). Patient not taking: Reported on 11/16/2019 08/07/18   Netta Cedars, MD  HYDROcodone-acetaminophen (NORCO/VICODIN) 5-325 MG tablet Take 1 tablet by mouth every 6 (six) hours as needed  for severe pain (pain). Patient not taking: Reported on 11/16/2019 08/07/18   Netta Cedars, MD  potassium chloride 20 MEQ TBCR Take 20 mEq by mouth daily. Patient not taking: Reported on 11/16/2019 03/08/19   Shelly Coss, MD   Allergies  Allergen Reactions  . Fish Allergy Hives  .  Diclofenac Other (See Comments)  . Sulfa Antibiotics     Unknown reaction   Review of Systems Denies pain  Physical Exam Is frail and weak appearing,  Awake alert Regular work of breathing S 1 S 2  No edema Non focal Abdomen not distended  Vital Signs: BP (!) 164/78 (BP Location: Left Arm)   Pulse 60   Temp 98.2 F (36.8 C) (Oral)   Resp (!) 30   Ht 5' (1.524 m)   Wt 47.4 kg   SpO2 95%   BMI 20.43 kg/m  Pain Scale: 0-10   Pain Score: 4    SpO2: SpO2: 95 % O2 Device:SpO2: 95 % O2 Flow Rate: .   IO: Intake/output summary: No intake or output data in the 24 hours ending 11/17/19 1638  LBM: Last BM Date: 11/16/19 Baseline Weight: Weight: 47.4 kg Most recent weight: Weight: 47.4 kg     Palliative Assessment/Data:   PPS 50%  Time In:  1530 Time Out:  1630 Time Total:   60  Greater than 50%  of this time was spent counseling and coordinating care related to the above assessment and plan.  Signed by: Loistine Chance, MD   Please contact Palliative Medicine Team phone at [phone] for questions and concerns.  For individual provider: See Shea Evans

## 2019-11-17 NOTE — Consult Note (Addendum)
NAME:  Cindy [name], MRN:  [id number], DOB:  June 14, 1922, LOS: 0 ADMISSION DATE:  11/16/2019, CONSULTATION DATE:  11/17/19 REFERRING MD:  Roger Shelter, CHIEF COMPLAINT:  Submassive PE  Brief History   Submassive PE  History of present illness   Mrs. Cindy [name] is a 86 year old woman who presented with abdominal pain, found to have submassive PE on CT of her abdomen.  History obtained from chart review and her caregiver [name] over the phone.  She has chronic memory issues and is not sure why she was brought to the hospital.  Overnight she was started on heparin.  She currently denies complaints.  According to her caregiver she has been social distancing, not seeing any people due to Covid.  She has a caregiver with her at all hours while she is awake, but is alone overnight.  Her son is her power of attorney, who lives in [place] but will be out of town in [place] this weekend.  Her daughter [name] is chronically disabled, but was updated overnight by the ED team.  Past Medical History  Dementia Debility LBBB HTN GERD   Significant Hospital Events     Consults:  PCCM  Procedures:    Significant Diagnostic Tests:  CT chest 8/6: LUL subsegmental PE, RLL segmental, subsegmental PE RLL. RV:LV 1.7. 1.8cm RML lesion similar in appearance to Nov 2020 renal CT scan. Trop 67--> 73 BNP 409.5  Micro Data:  covid negative  Antimicrobials:    Interim history/subjective:  She denies complaints this morning.  Objective   Blood pressure (!) 184/113, pulse 65, temperature (!) 97.2 F (36.2 C), resp. rate (!) 23, height 5' (1.524 m), weight 47.4 kg, SpO2 94 %.       No intake or output data in the 24 hours ending 11/17/19 0851 Filed Weights   11/16/19 1418  Weight: 47.4 kg    Examination: General: Frail appearing elderly woman lying in bed no acute distress HENT: [place]/AT, eyes anicteric Lungs: Comfortably in room air, no conversational dyspnea.  Clear to auscultation  bilaterally. Cardiovascular: Regular rate and rhythm Abdomen: soft, ND, NT Extremities: No pretibial edema, no cyanosis Neuro: Awake and alert, not able to provide much history, but answers questions appropriately that she is not sure.  Cooperative with exam. Derm: No rashes  Resolved Hospital Problem list     Assessment & Plan:  Acute submassive PE; PESI  III  (a/w 3.2- 7.1% mortality). -Agree with ED's assessment that she is high risk for decompensation with thrombolytics rather than conservative treatment with anticoagulation given her stable vital signs and lack of symptoms. No direct contraindications to lytics, but relative contraindication with age. Would consider catheter directed lytics vs full dose lytics only if decompensating clinically.  -Continue heparin -Echo pending -Monitor on telemetry -Can continue to monitor troponins, but would prefer to base decision to escalate therapy on clinical parameters. -Attempted to contact son [name], who was unavailable- L/M. Attempted to call daughter [name], unavailable-L/M.  Spoke to caregiver [name] about her care, who will be coming to bedside today. -No recommendations are age-appropriate cancer screenings at her age, but focus screening based on history and physical as an outpatient could be performed.  RML nodule- unchanged since 02/2019 CT -OP follow up CT in 12 months.  Best practice:  Per primary  Labs   CBC: Recent Labs  Lab 11/16/19 1433 11/17/19 0500 11/17/19 0756  WBC 14.3* 12.6* 12.9*  NEUTROABS  --  8.4*  --   HGB 13.3 12.8 12.4  HCT  40.8 38.5 37.0  MCV 100.2* 99.7 98.9  PLT 264 276 619    Basic Metabolic Panel: Recent Labs  Lab 11/16/19 1433 11/17/19 0500  NA 142 140  K 4.1 4.0  CL 108 107  CO2 24 22  GLUCOSE 117* 90  BUN 29* 25*  CREATININE 1.04* 0.90  CALCIUM 9.0 8.7*  MG  --  1.9  PHOS  --  2.7   GFR: Estimated Creatinine Clearance: 26.9 mL/min (by C-G formula based on SCr of 0.9  mg/dL). Recent Labs  Lab 11/16/19 1433 11/17/19 0500 11/17/19 0756  WBC 14.3* 12.6* 12.9*    Liver Function Tests: Recent Labs  Lab 11/16/19 1433 11/17/19 0500  AST 15 19  ALT 14 15  ALKPHOS 53 50  BILITOT 0.8 1.1  PROT 7.1 6.6  ALBUMIN 3.7 3.3*   Recent Labs  Lab 11/16/19 1433  LIPASE 20   No results for input(s): AMMONIA in the last 168 hours.  ABG    Component Value Date/Time   PHART 7.432 02/18/2019 1719   PCO2ART 32.4 02/18/2019 1719   PO2ART 58.4 (L) 02/18/2019 1719   HCO3 21.3 02/18/2019 1719   TCO2 22 02/18/2019 1750   ACIDBASEDEF 1.3 02/18/2019 1719   O2SAT 88.9 02/18/2019 1719     Coagulation Profile: Recent Labs  Lab 11/16/19 2239  INR 1.1    Cardiac Enzymes: No results for input(s): CKTOTAL, CKMB, CKMBINDEX, TROPONINI in the last 168 hours.  HbA1C: Hgb A1c MFr Bld  Date/Time Value Ref Range Status  03/06/2019 07:58 AM 6.0 (H) 4.8 - 5.6 % Final    Comment:    (NOTE) Pre diabetes:          5.7%-6.4% Diabetes:              >6.4% Glycemic control for   <7.0% adults with diabetes     CBG: No results for input(s): GLUCAP in the last 168 hours.  Review of Systems:   Unable to be obtained due to underlying dementia  Past Medical History  She,  has a past medical history of Arthritis, Chronic renal insufficiency, stage III (moderate), Depression, Dyslipidemia, Forgetfulness, GERD (gastroesophageal reflux disease), Hiatal hernia, History of nuclear stress test (2005), HOH (hard of hearing), Hypertension, Hypothyroidism, LBBB (left bundle branch block), Lung nodule (07/01/03), Macular degeneration, Malignant melanoma ([place]) (11/12/04), Murmur, cardiac, Osteoporosis, and Pneumonia (06/2018).   Surgical History    Past Surgical History:  Procedure Laterality Date  . BLADDER REPAIR    . CARDIAC CATHETERIZATION  2005/2007   normal coronaries   . CATARACT EXTRACTION     bilat  . coccyx  1951   excision  . COLONOSCOPY    . knee     right  cartilage repair  . REVERSE SHOULDER ARTHROPLASTY Left 08/04/2018   Procedure: REVERSE LEFT SHOULDER ARTHROPLASTY;  Surgeon: Netta Cedars, MD;  Location: [facility];  Service: Orthopedics;  Laterality: Left;  . ROTATOR CUFF REPAIR     right  . TOTAL ABDOMINAL HYSTERECTOMY W/ BILATERAL SALPINGOOPHORECTOMY    . TRANSTHORACIC ECHOCARDIOGRAM  2007   EF=>55%; borderline conc LVH; LA mod dilated; RA mild-mod dilated; mild TR; mild AVR with mildly sclerotic AV leavlefts; mild PV regurg      Social History   reports that she has never smoked. She has never used smokeless tobacco. She reports that she does not drink alcohol and does not use drugs.   Family History   Her family history includes Cancer in her daughter, father, maternal aunt,  maternal grandmother, and son.   Allergies Allergies  Allergen Reactions  . Fish Allergy Hives  . Diclofenac Other (See Comments)  . Sulfa Antibiotics     Unknown reaction     Home Medications  Prior to Admission medications   Medication Sig Start Date End Date Taking? Authorizing Provider  acetaminophen (TYLENOL) 500 MG tablet Take 500 mg by mouth in the morning, at noon, and at bedtime.   Yes [provider]  aspirin 81 MG tablet Take 81 mg by mouth daily.   Yes [provider]  cetirizine (ZYRTEC) 10 MG tablet Take 10 mg by mouth daily.   Yes [provider]  ciprofloxacin (CIPRO) 250 MG tablet Take 250 mg by mouth as directed. Take PRN for Kidney Infection   Yes [provider]  clobetasol (TEMOVATE) 0.05 % external solution Apply 1 application topically 2 (two) times a week.  04/21/18  Yes [provider]  CRANBERRY PO Take 1 tablet by mouth daily.    Yes [provider]  fluocinolone (SYNALAR) 0.025 % cream Apply 1 application topically 2 (two) times daily.   Yes [provider]  glucosamine-chondroitin 500-400 MG tablet Take 1 tablet by mouth daily.   Yes [provider]   hydrocortisone 2.5 % cream Apply 1 application topically 2 (two) times daily as needed (dry skin).  02/23/18  Yes [provider]  hydroxypropyl methylcellulose / hypromellose (ISOPTO TEARS / GONIOVISC) 2.5 % ophthalmic solution Place 1 drop into both eyes daily.   Yes [provider]  ipratropium (ATROVENT) 0.06 % nasal spray Place 1-2 sprays into both nostrils every 6 (six) hours as needed (rhinorrhea).   Yes [provider]  ketoconazole (NIZORAL) 2 % shampoo Apply 1 application topically 3 (three) times a week.  03/29/18  Yes [provider]  levothyroxine (SYNTHROID) 75 MCG tablet Take 75 mcg by mouth daily before breakfast.  07/01/16  Yes [provider]  mirabegron ER (MYRBETRIQ) 50 MG TB24 tablet Take 50 mg by mouth daily.  03/01/17  Yes [provider]  Multiple Vitamins-Minerals (HAIR SKIN AND NAILS FORMULA PO) Take 3 tablets by mouth daily.    Yes [provider]  Multiple Vitamins-Minerals (OCUVITE EXTRA) TABS Take 1 tablet by mouth daily.   Yes [provider]  nitrofurantoin (MACRODANTIN) 100 MG capsule Take 100 mg by mouth as directed. Take PRN for Kidney Infection   Yes [provider]  Omega-3 1000 MG CAPS Take 1,000 mg by mouth 2 (two) times a day.   Yes [provider]  omega-3 acid ethyl esters (LOVAZA) 1 g capsule Take 1 g by mouth 2 (two) times daily.   Yes [provider]  omeprazole (PRILOSEC) 20 MG capsule Take 20 mg by mouth daily.   Yes [provider]  polyethylene glycol (MIRALAX) 17 g packet Take 17 g by mouth daily. 03/07/19  Yes Shelly Coss, MD  simvastatin (ZOCOR) 40 MG tablet Take 40 mg by mouth every evening.    Yes [provider]  solifenacin (VESICARE) 10 MG tablet Take 10 mg by mouth daily. 01/26/19  Yes [provider]  traMADol (ULTRAM) 50 MG tablet Take 50 mg by mouth every 8 (eight) hours as needed for moderate pain or severe  pain.  09/12/18  Yes [provider]  Triamcinolone Acetonide 0.025 % LOTN Apply 1 application topically 2 (two) times daily as needed (scalp).  08/24/19  Yes [provider]  triamcinolone cream (KENALOG) 0.1 %  Apply 1 application topically 2 (two) times daily.   Yes [provider]  acetaminophen (TYLENOL) 325 MG tablet Take 1-2 tablets (325-650 mg total) by mouth every 6 (six) hours as needed for mild pain or moderate pain (pain score 1-3 or temp > 100.5). Patient not taking: Reported on 11/16/2019 08/07/18   Netta Cedars, MD  HYDROcodone-acetaminophen (NORCO/VICODIN) 5-325 MG tablet Take 1 tablet by mouth every 6 (six) hours as needed for severe pain (pain). Patient not taking: Reported on 11/16/2019 08/07/18   Netta Cedars, MD  potassium chloride 20 MEQ TBCR Take 20 mEq by mouth daily. Patient not taking: Reported on 11/16/2019 03/08/19   Shelly Coss, MD      Julian Hy, DO 11/17/19 10:01 AM [facility]

## 2019-11-18 LAB — CBC
HCT: 38.4 % (ref 36.0–46.0)
Hemoglobin: 12.8 g/dL (ref 12.0–15.0)
MCH: 32.6 pg (ref 26.0–34.0)
MCHC: 33.3 g/dL (ref 30.0–36.0)
MCV: 97.7 fL (ref 80.0–100.0)
Platelets: 293 10*3/uL (ref 150–400)
RBC: 3.93 MIL/uL (ref 3.87–5.11)
RDW: 13.6 % (ref 11.5–15.5)
WBC: 16.3 10*3/uL — ABNORMAL HIGH (ref 4.0–10.5)
nRBC: 0 % (ref 0.0–0.2)

## 2019-11-18 LAB — HEPARIN LEVEL (UNFRACTIONATED): Heparin Unfractionated: 0.5 IU/mL (ref 0.30–0.70)

## 2019-11-18 MED ORDER — ENSURE ENLIVE PO LIQD: 237.0000 mL | Freq: Two times a day (BID) | ORAL | Status: DC

## 2019-11-18 MED ORDER — ADULT MULTIVITAMIN W/MINERALS CH
1.0000 | ORAL_TABLET | Freq: Every day | ORAL | Status: DC
  Administered 2019-11-18 – 2019-11-19 (×2): 1 via ORAL
  Filled 2019-11-18 (×2): qty 1

## 2019-11-18 NOTE — Progress Notes (Signed)
PT Cancellation Note  Patient Details Name: Cindy [name] MRN: [id number] DOB: May 25, 1922   Cancelled Treatment:    Reason Eval/Treat Not Completed: Patient not medically ready--pt with submassive/saddle PE. Heparin started this am, will defer PT eval per PT guidelines at this time  and continue efforts to see pt when medically possible.   [facility] 11/18/2019, 9:49 AM

## 2019-11-18 NOTE — Consult Note (Signed)
NAME:  Cindy [name], MRN:  [id number], DOB:  [birth year], LOS: 1 ADMISSION DATE:  11/16/2019, CONSULTATION DATE:  11/17/19 REFERRING MD:  Roger Shelter, CHIEF COMPLAINT:  Submassive PE  Brief History   Submassive PE  History of present illness   Cindy [name] is a 86 year old woman who presented with abdominal pain, found to have submassive PE on CT of her abdomen.  History obtained from chart review and her caregiver Cindy [name] over the phone.  She has chronic memory issues and is not sure why she was brought to the hospital.  Overnight she was started on heparin.  She currently denies complaints.  According to her caregiver she has been social distancing, not seeing any people due to Covid.  She has a caregiver with her at all hours while she is awake, but is alone overnight.  Her son is her power of attorney, who lives in [place] but will be out of town in [place] this weekend.  Her daughter Cindy [name] is chronically disabled, but was updated overnight by the ED team.  Past Medical History  Dementia Debility LBBB HTN GERD   Significant Hospital Events     Consults:  PCCM  Procedures:    Significant Diagnostic Tests:  CT chest 8/6: LUL subsegmental PE, RLL segmental, subsegmental PE RLL. RV:LV 1.7. 1.8cm RML lesion similar in appearance to Nov 2020 renal CT scan. Trop 67--> 73 BNP 409.5  Micro Data:  covid negative  Antimicrobials:    Interim history/subjective:  Morning she denies complaints.  Doing well with eating.  Has not been out of bed to walk yet.  Objective   Blood pressure (!) 150/68, pulse 69, temperature 97.6 F (36.4 C), temperature source Oral, resp. rate (!) 23, height 5' (1.524 m), weight 47.4 kg, SpO2 97 %.        Intake/Output Summary (Last 24 hours) at 11/18/2019 1715 Last data filed at 11/18/2019 0900 Gross per 24 hour  Intake 250 ml  Output --  Net 250 ml   Filed Weights   11/16/19 1418  Weight: 47.4 kg    Examination: General: Elderly woman sitting up  in bed eating lunch.  Frail-appearing, but interactive. HENT: [place]/AT, eyes anicteric.  Oral mucosa moist. Lungs: Breathing comfortably on room air without conversational dyspnea.  Clear to auscultation bilaterally. Cardiovascular: Regular rate and rhythm Abdomen: Soft, nontender, nondistended Extremities: No peripheral edema, no clubbing or cyanosis. Neuro: Awake and alert, interactive, answering questions appropriately.  Eating unassisted. Derm: Pallor, no rashes.  No significant bruising or petechiae.  Resolved Hospital Problem list     Assessment & Plan:  Acute submassive PE; PESI  III  (a/w 3.2- 7.1% mortality).  Moderately reduced RV function on echocardiogram. -Doing well on anticoagulation with stable vital signs.  Unlikely that she will decompensate, although catheter directed lytics or peripheral lytics remains an option if she does hemodynamically decompensate. -Continue heparin.  Recommend starting Eliquis twice daily tomorrow and continuing for 3 to 6 months after discharge.  Although indefinite anticoagulation is recommended with unprovoked PEs, and a 86 year old that is significantly higher risk.  This should be a discussion with her primary care provider, herself, and her son based on her tolerance of her necessary course of anticoagulation to determine if she should continue this. -Monitor on telemetry -Spoke to caregiver Cindy [name] at bedside.  Okay to continue routine activity at home.  She should be walked prior to discharge to ensure she does not develop exertional symptoms. -No recommendations are age-appropriate cancer screenings at her age,  but focus screening based on history and physical as an outpatient could be performed.  RML nodule- unchanged since 02/2019 CT -OP follow up CT in 12 months.   Discused with Dr. Roger Shelter.  Best practice:  Per primary  Labs   CBC: Recent Labs  Lab 11/16/19 1433 11/17/19 0500 11/17/19 0756 11/18/19 0546  WBC 14.3* 12.6* 12.9*  16.3*  NEUTROABS  --  8.4*  --   --   HGB 13.3 12.8 12.4 12.8  HCT 40.8 38.5 37.0 38.4  MCV 100.2* 99.7 98.9 97.7  PLT 264 276 265 383    Basic Metabolic Panel: Recent Labs  Lab 11/16/19 1433 11/17/19 0500  NA 142 140  K 4.1 4.0  CL 108 107  CO2 24 22  GLUCOSE 117* 90  BUN 29* 25*  CREATININE 1.04* 0.90  CALCIUM 9.0 8.7*  MG  --  1.9  PHOS  --  2.7   GFR: Estimated Creatinine Clearance: 26.9 mL/min (by C-G formula based on SCr of 0.9 mg/dL). Recent Labs  Lab 11/16/19 1433 11/17/19 0500 11/17/19 0756 11/18/19 0546  WBC 14.3* 12.6* 12.9* 16.3*    Liver Function Tests: Recent Labs  Lab 11/16/19 1433 11/17/19 0500  AST 15 19  ALT 14 15  ALKPHOS 53 50  BILITOT 0.8 1.1  PROT 7.1 6.6  ALBUMIN 3.7 3.3*   Recent Labs  Lab 11/16/19 1433  LIPASE 20   No results for input(s): AMMONIA in the last 168 hours.  ABG    Component Value Date/Time   PHART 7.432 02/18/2019 1719   PCO2ART 32.4 02/18/2019 1719   PO2ART 58.4 (L) 02/18/2019 1719   HCO3 21.3 02/18/2019 1719   TCO2 22 02/18/2019 1750   ACIDBASEDEF 1.3 02/18/2019 1719   O2SAT 88.9 02/18/2019 1719     Coagulation Profile: Recent Labs  Lab 11/16/19 2239  INR 1.1    Cardiac Enzymes: No results for input(s): CKTOTAL, CKMB, CKMBINDEX, TROPONINI in the last 168 hours.  HbA1C: Hgb A1c MFr Bld  Date/Time Value Ref Range Status  03/06/2019 07:58 AM 6.0 (H) 4.8 - 5.6 % Final    Comment:    (NOTE) Pre diabetes:          5.7%-6.4% Diabetes:              >6.4% Glycemic control for   <7.0% adults with diabetes     CBG: No results for input(s): GLUCAP in the last 168 hours.   Julian Hy, DO 11/18/19 5:21 PM [facility]

## 2019-11-18 NOTE — Progress Notes (Signed)
PROGRESS NOTE    Patient: Cindy [name]                            PCP: Lorne Skeens, MD                    DOB: Jan 27, 1923            DOA: 11/16/2019 NLG:[id number]             DOS: 11/18/2019, 10:09 AM   LOS: 1 day   Date of Service: The patient was seen and examined on 11/18/2019  Subjective:   The patient was seen and examined this morning, stable no acute distress, easily arousable, denies any chest pain or shortness of breath Mildly tachypneic with respiratory rate of 24 Remains on heparin drip.   Brief Narrative:  Per HPI:  Daquana Paddock is a 86 y.o. female with medical history significant of CAD, chronic renal insufficiency stage 4, chronic left bundle branch block, dementia, arthritis, HLD, GERD, hypothyroidism. Presented with  RUQ pain and Chest pain  She had had decreased p.o. intake.  Reports sharp pain in her right upper quadrant. Has   been vaccinated against COVID  Initial COVID TEST   ER: Initially was worked up for gallstone disease which was unremarkable US showed no cholecystitis CT abd showed incidental bilateral pulmonary embolism, right-sided embolism. Subsequently admitted for observation, started on heparin drip.   Assessment & Plan:   Active Problems:   Left bundle branch block   Essential hypertension   Hyperlipidemia   CRI (chronic renal insufficiency), stage 4 (severe) (HCC)   GERD (gastroesophageal reflux disease)   Pulmonary embolism (HCC)   Dementia (HCC)   Hypothyroidism   Pancreatic mass   Pulmonary emboli (HCC)   Palliative care by specialist   Goals of care, counseling/discussion   General weakness     Pulmonary embolism ([place]) / BL pulmonary embolism - Right saddle emboli -Remains hemodynamically stable, on room air, denies any chest pain or shortness of breath  On room air satting 96% -Remains mildly tachypneic with respiratory rate of 24 -We will continue heparin gtt -Appreciate pulmonary critical care following -no  indication for direct lytic treatment -CT angiogram: Bilateral pleural embolism, sonolucent   -Continue heparin drip for today anticipating transition to oral anticoagulation   -Mildly elevated troponin recycling, monitoring closely 67, 73, -2D Echocardiogram >>65 to 70%.  Left LV function normal, reduced right LV function, with mild strain -Bilateral extremity Doppler study >> negative for DVTs  Most likely risk factors for hypercoagulable state being  sedentary lifestyle  unknown  large PE but no evidence of decompensation, or right heart strain  CKD stage 4 -Monitoring BUN/creatinine closely, currently stable Baseline creatinine around 1 -Creatinine 1.04 >>> 0.90 BUN 29, 25,  Continue to monitor this could be actually due to muscle wasting.   Patient received IV contrast for her studies.   -We will continue gentle IV hydration  Essential hypertension/accelerated hypertension -We will allow for mild hypertensive state -No new blood pressure medication, as needed hydralazine, small dose amlodipine -Avoiding hypotensive state, in the presence of PE  Leukocytosis -Monitoring, likely reactive to PE -No signs of infection (afebrile, normotensive_  . GERD (gastroesophageal reflux disease) stable continue home meds  . Hyperlipidemia -chronic stable continue Zocor  . Left bundle branch block -chronic stable  . Dementia ([place]) -chronic stable continue to monitor for any signs of sundowning  . Hypothyroidism - -  Check TSH continue home medications at current dose   Nutritional status:          Consultants: Pulmonary critical care team    ----------------------------------------------------------------------------------------------------------------------------  DVT prophylaxis: On heparin drip  Code Status:   Code Status: DNR Family Communication: No family member present at bedside-  Attempting to reach family members, caregiver Mr. [name] The above findings  and plan of care has been discussed with patient in detail.  -Advance care planning has been discussed.   Admission status:    Status is: Switch to inpatient  Patient meets the inpatient criteria due to critical finding of bilateral pulmonary embolism, saddle emboli, needing IV heparin, and critical care evaluation for possible intervention like thrombectomy if necessary.  Dispo: The patient is from: Home 24-hour with caregiver              Anticipated d/c is to: Home with caregiver              Anticipated d/c date is: 3 days              Patient currently is not medically stable to d/c.        Procedures:   No admission procedures for hospital encounter.     Antimicrobials:  Anti-infectives (From admission, onward)   None       Medication:  . amLODipine  5 mg Oral Daily  . aspirin  81 mg Oral Daily  . darifenacin  7.5 mg Oral Daily  . docusate sodium  100 mg Oral BID  . levothyroxine  75 mcg Oral QAC breakfast  . mirabegron ER  50 mg Oral Daily  . simvastatin  20 mg Oral QPM  . sodium chloride flush  3 mL Intravenous Once  . sodium chloride flush  3 mL Intravenous Q12H    acetaminophen **OR** acetaminophen, hydrALAZINE, HYDROcodone-acetaminophen, ondansetron **OR** ondansetron (ZOFRAN) IV, traMADol   Objective:   Vitals:   11/17/19 2300 11/18/19 0320 11/18/19 0400 11/18/19 0700  BP: (!) 157/66 (!) 194/83 (!) 154/69 131/67  Pulse: 61 69 69 71  Resp: 19 (!) 26  (!) 24  Temp: 97.6 F (36.4 C) 98 F (36.7 C)  98.7 F (37.1 C)  TempSrc: Oral Oral  Oral  SpO2: 96% 96%  96%  Weight:      Height:        Intake/Output Summary (Last 24 hours) at 11/18/2019 1009 Last data filed at 11/18/2019 0900 Gross per 24 hour  Intake 250 ml  Output --  Net 250 ml   Filed Weights   11/16/19 1418  Weight: 47.4 kg     Examination:      Physical Exam:   General:  Alert, oriented, cooperative, no distress;   HEENT:  Normocephalic, PERRL, otherwise with in  Normal limits   Neuro:  CNII-XII intact. , normal motor and sensation, reflexes intact   Lungs:   Clear to auscultation BL, Respirations unlabored, no wheezes / crackles  Cardio:    S1/S2, RRR, No murmure, No Rubs or Gallops   Abdomen:   Soft, non-tender, bowel sounds active all four quadrants,  no guarding or peritoneal signs.  Muscular skeletal:  Limited exam - in bed, able to move all 4 extremities, Normal strength,  2+ pulses,  symmetric, No pitting edema  Skin:  Dry, warm to touch, negative for any Rashes, No open wounds  Wounds: Please see nursing documentation         ------------------------------------------------------------------------------------------------------------------------------------------    LABs:  CBC Latest  Ref Rng & Units 11/18/2019 11/17/2019 11/17/2019  WBC 4.0 - 10.5 K/uL 16.3(H) 12.9(H) 12.6(H)  Hemoglobin 12.0 - 15.0 g/dL 12.8 12.4 12.8  Hematocrit 36 - 46 % 38.4 37.0 38.5  Platelets 150 - 400 K/uL 293 265 276   CMP Latest Ref Rng & Units 11/17/2019 11/16/2019 03/07/2019  Glucose 70 - 99 mg/dL 90 117(H) 83  BUN 8 - 23 mg/dL 25(H) 29(H) 74(H)  Creatinine 0.44 - 1.00 mg/dL 0.90 1.04(H) 2.18(H)  Sodium 135 - 145 mmol/L 140 142 139  Potassium 3.5 - 5.1 mmol/L 4.0 4.1 3.0(L)  Chloride 98 - 111 mmol/L 107 108 108  CO2 22 - 32 mmol/L 22 24 20(L)  Calcium 8.9 - 10.3 mg/dL 8.7(L) 9.0 8.1(L)  Total Protein 6.5 - 8.1 g/dL 6.6 7.1 -  Total Bilirubin 0.3 - 1.2 mg/dL 1.1 0.8 -  Alkaline Phos 38 - 126 U/L 50 53 -  AST 15 - 41 U/L 19 15 -  ALT 0 - 44 U/L 15 14 -       Micro Results Recent Results (from the past 240 hour(s))  SARS Coronavirus 2 by RT PCR (hospital order, performed in [facility] hospital lab) Nasopharyngeal Nasopharyngeal Swab     Status: None   Collection Time: 11/16/19 10:59 PM   Specimen: Nasopharyngeal Swab  Result Value Ref Range Status   SARS Coronavirus 2 NEGATIVE NEGATIVE Final    Comment: (NOTE) SARS-CoV-2 target nucleic acids are NOT  DETECTED.  The SARS-CoV-2 RNA is generally detectable in upper and lower respiratory specimens during the acute phase of infection. The lowest concentration of SARS-CoV-2 viral copies this assay can detect is 250 copies / mL. A negative result does not preclude SARS-CoV-2 infection and should not be used as the sole basis for treatment or other patient management decisions.  A negative result may occur with improper specimen collection / handling, submission of specimen other than nasopharyngeal swab, presence of viral mutation(s) within the areas targeted by this assay, and inadequate number of viral copies (<250 copies / mL). A negative result must be combined with clinical observations, patient history, and epidemiological information.  Fact Sheet for Patients:   StrictlyIdeas.no  Fact Sheet for Healthcare Providers: BankingDealers.co.za  This test is not yet approved or  cleared by the [place] FDA and has been authorized for detection and/or diagnosis of SARS-CoV-2 by FDA under an Emergency Use Authorization (EUA).  This EUA will remain in effect (meaning this test can be used) for the duration of the COVID-19 declaration under Section 564(b)(1) of the Act, 21 U.S.C. section 360bbb-3(b)(1), unless the authorization is terminated or revoked sooner.  Performed at [facility], [place] [address], [place], [place] [address]     Radiology Reports CT Angio Chest PE W and/or Wo Contrast  Result Date: 11/16/2019 CLINICAL DATA:  Possible PE on CT abdomen pelvis, right upper quadrant pain EXAM: CT ANGIOGRAPHY CHEST WITH CONTRAST TECHNIQUE: Multidetector CT imaging of the chest was performed using the standard protocol during bolus administration of intravenous contrast. Multiplanar CT image reconstructions and MIPs were obtained to evaluate the vascular anatomy. CONTRAST:  56 mL OMNIPAQUE IOHEXOL 350 MG/ML SOLN  COMPARISON:  CT 11/16/2019, chest CT 08/04/2011 FINDINGS: Cardiovascular: Satisfactory opacification of the pulmonary arteries to the segmental level. Acute thrombus within right upper lobe segmental pulmonary vessels with evidence of saddle embolus extending from right upper lobe pulmonary vessels to the right descending pulmonary artery and right lower lobe pulmonary arteries. Occlusive appearing thrombus within  right middle lobe pulmonary vessels. Extension of embolus into right lower lobe segmental pulmonary vessels. Suspected moderate thrombus along the posterior wall of the left pulmonary artery, with extension of thrombus into left descending lobar and left lower lobe segmental vessels. RV LV ratio of 1.7. Nonaneurysmal aorta. Moderate aortic atherosclerosis. Coronary vascular calcification. Borderline cardiomegaly. No pericardial effusion. Mediastinum/Nodes: Midline trachea. No thyroid mass. Borderline left paratracheal lymph node measuring 12 mm. Esophagus within normal limits. Lungs/Pleura: Trace bilateral pleural effusions. Bronchiectasis within the right upper lobe and left greater than right lower lobes. Chronic consolidation in the left lower lobe with slight increased superimposed airspace disease. Chronic elevation of left diaphragm. 14 mm right middle lobe pulmonary nodule, series 7, image number 67. Upper Abdomen: Herniation of most of the stomach, in addition to colon and mesentery into the left lower chest as before. Musculoskeletal: Diffuse degenerative changes. No acute or suspicious osseous abnormality. Review of the MIP images confirms the above findings.    IMPRESSION: 1. Positive for acute bilateral pulmonary emboli including saddle embolus on the right. Positive for acute PE with CTevidence of right heart strain (RV/LV Ratio = 1.7) consistent with at least submassive (intermediate risk) PE. The presence of right heart strain has been associated with an increased risk of morbidity and  mortality. 2. Chronic elevation of the left diaphragm with large left-sided hernia. Chronic consolidation in the left lung base likely due to atelectasis, there is slightly increased airspace disease at the left base and a component of superimposed infarct is difficult to exclude. There is left greater than right lower lobe bronchiectasis which is also chronic. 3. Small bilateral right greater than left pleural effusion. 4. 14 mm right middle lobe pulmonary nodule, slight increase in size compared with 2013 study at which time the nodule measured 11 mm. Critical test result of pulmonary embolus previously called to Dr. Langston Masker at the time of previously reported CT abdomen and pelvis. Aortic Atherosclerosis (ICD10-I70.0). Electronically Signed   By: Donavan Foil M.D.   On: 11/16/2019 23:33   CT ABDOMEN PELVIS W CONTRAST  Result Date: 11/16/2019 CLINICAL DATA:  Abdominal pain with leukocytosis EXAM: CT ABDOMEN AND PELVIS WITH CONTRAST TECHNIQUE: Multidetector CT imaging of the abdomen and pelvis was performed using the standard protocol following bolus administration of intravenous contrast. CONTRAST:  30mL OMNIPAQUE IOHEXOL 300 MG/ML  SOLN COMPARISON:  CT 03/05/2019, 02/18/2019, 11/08/2016 FINDINGS: Lower chest: Small right-sided pleural effusion 14 mm right middle lobe pulmonary nodule near the hilum, series 6, image number 8, previously 13 mm. Large left-sided hernia containing stomach, mesentery and colon. Incompletely visualized filling defects within the distal right pulmonary artery, extending into the right lower lobe pulmonary arteries consistent with acute pulmonary embolus. Hepatobiliary: No focal liver abnormality is seen. Punctate stones within the gallbladder. No biliary dilatation. Pancreas: Atrophic. No inflammatory change. 1.6 x 2.5 cm cystic lesion near the uncinate process of the pancreas. Spleen: Normal in size without focal abnormality. Adrenals/Urinary Tract: Adrenal glands are normal. There  are cysts within the bilateral kidneys with additional subcentimeter hypodense lesions too small to further characterize. Urinary bladder is unremarkable Stomach/Bowel: Stomach herniated into the left chest. No dilated small bowel. No bowel wall thickening. Negative appendix. Descending and sigmoid colon diverticular disease without acute inflammatory change. Vascular/Lymphatic: Moderate severe aortic atherosclerosis. No aneurysm. No suspicious nodes. Reproductive: Status post hysterectomy. No adnexal masses. Other: Negative for free air or free fluid. Musculoskeletal: Degenerative changes of the lumbar spine. Anterolisthesis L4 on L5, L3 on L4 with  retrolisthesis L2 on L1. No acute osseous abnormality. IMPRESSION: 1. Incompletely visualized filling defects within the distal right pulmonary artery, extending into the right lower lobe pulmonary arteries consistent with acute pulmonary embolus. 2. Small right-sided pleural effusion. 14 mm right middle lobe pulmonary nodule, previously 13 mm. 3. Large left-sided hernia containing stomach, mesentery, and colon. No evidence for bowel obstruction. 4. Colonic diverticular disease without acute inflammatory change. 5. Cholelithiasis. 6. 2.5 cm cystic lesion near the uncinate process of the pancreas, intermittently visualized, slowly enlarging over time. Recommend clinical management given patient age. Critical Value/emergent results were called by telephone at the time of interpretation on 11/16/2019 at 10:23 pm to provider MATTHEW TRIFAN , who verbally acknowledged these results. Aortic Atherosclerosis (ICD10-I70.0). Electronically Signed   By: Donavan Foil M.D.   On: 11/16/2019 22:23   ECHOCARDIOGRAM COMPLETE  Result Date: 11/17/2019    ECHOCARDIOGRAM REPORT   Patient Name:   AGUSTA HACKENBERG Date of Exam: 11/17/2019 Medical Rec #:  [id number]    Height:       60.0 in Accession #:    [id number]   Weight:       104.6 lb Date of Birth:  February 15, 1923    BSA:          1.417 m  Patient Age:    86 years     BP:           141/72 mmHg Patient Gender: F            HR:           74 bpm. Exam Location:  Inpatient Procedure: 2D Echo and Intracardiac Opacification Agent Indications:    Pulmonary Embolus I26.99  History:        Patient has prior history of Echocardiogram examinations, most                 recent 03/06/2019. Risk Factors:Hypertension and Dyslipidemia.  Sonographer:    Mikki Santee RDCS (AE) Referring Phys: [place]   1. Left ventricular ejection fraction, by estimation, is 65 to 70%. The left ventricle has normal function. The left ventricle has no regional wall motion abnormalities. Left ventricular diastolic parameters are consistent with Grade I diastolic dysfunction (impaired relaxation).  2. Right ventricular systolic function is moderately reduced. The right ventricular size is normal. Tricuspid regurgitation signal is inadequate for assessing PA pressure.  3. The mitral valve is normal in structure. Trivial mitral valve regurgitation. No evidence of mitral stenosis.  4. The aortic valve was not well visualized. There is moderate thickening and moderate calcification of the aortic valve. Aortic valve regurgitation is mild. Mild to moderate aortic valve sclerosis/calcification is present, without any evidence of aortic stenosis. Aortic regurgitation PHT measures 717 msec. FINDINGS  Left Ventricle: Left ventricular ejection fraction, by estimation, is 65 to 70%. The left ventricle has normal function. The left ventricle has no regional wall motion abnormalities. The left ventricular internal cavity size was normal in size. There is  no left ventricular hypertrophy. Abnormal (paradoxical) septal motion, consistent with left bundle branch block. Left ventricular diastolic parameters are consistent with Grade I diastolic dysfunction (impaired relaxation). Indeterminate filling pressures. Right Ventricle: The right ventricular size is normal. No  increase in right ventricular wall thickness. Right ventricular systolic function is moderately reduced. Tricuspid regurgitation signal is inadequate for assessing PA pressure. Left Atrium: Left atrial size was normal in size. Right Atrium: Right atrial size was normal in size. Pericardium: There is no evidence of pericardial  effusion. Mitral Valve: The mitral valve is normal in structure. There is mild calcification of the mitral valve leaflet(s). Normal mobility of the mitral valve leaflets. Mild mitral annular calcification. Trivial mitral valve regurgitation. No evidence of mitral valve stenosis. Tricuspid Valve: The tricuspid valve is normal in structure. Tricuspid valve regurgitation is not demonstrated. No evidence of tricuspid stenosis. Aortic Valve: The aortic valve was not well visualized. . There is moderate thickening and moderate calcification of the aortic valve. Aortic valve regurgitation is mild. Aortic regurgitation PHT measures 717 msec. Mild to moderate aortic valve sclerosis/calcification is present, without any evidence of aortic stenosis. There is moderate thickening of the aortic valve. There is moderate calcification of the aortic valve. Pulmonic Valve: The pulmonic valve was normal in structure. Pulmonic valve regurgitation is not visualized. No evidence of pulmonic stenosis. Aorta: The aortic root is normal in size and structure. IAS/Shunts: No atrial level shunt detected by color flow Doppler.MV E/A ratio:  0.56 Fransico Him MD Electronically signed by Fransico Him MD Signature Date/Time: 11/17/2019/2:48:53 PM    Final    US Abdomen Limited RUQ  Result Date: 11/16/2019 CLINICAL DATA:  Biliary colic. EXAM: ULTRASOUND ABDOMEN LIMITED RIGHT UPPER QUADRANT COMPARISON:  None. FINDINGS: Gallbladder: Nonmobile, shadowing echogenic gallstones are seen within the gallbladder fundus. There is no evidence of gallbladder wall thickening (2 mm). No sonographic Murphy sign noted by sonographer. Common  bile duct: Diameter: 2.5 mm Liver: No focal lesion identified. Within normal limits in parenchymal echogenicity. Portal vein is patent on color Doppler imaging with normal direction of blood flow towards the liver. Other: Of incidental note is the presence of a right-sided pleural effusion. IMPRESSION: 1. Cholelithiasis, without evidence of acute cholecystitis. 2. Right pleural effusion. Electronically Signed   By: Virgina Norfolk M.D.   On: 11/16/2019 19:53    SIGNED: Deatra James, MD, FACP, FHM. [facility],  Pager (please use [email or web address] to page/text)  If 7PM-7AM, please contact night-coverage [email or web address], Password [facility] 11/18/2019, 10:09 AM

## 2019-11-18 NOTE — Progress Notes (Signed)
OT Cancellation Note  Patient Details Name: Cindy [name] MRN: [id number] DOB: February 20, 1923   Cancelled Treatment:    Reason Eval/Treat Not Completed: Patient not medically ready Completed: Patient not medically ready--pt with submassive/saddle PE. Heparin started this am, will defer OT eval per OT guidelines at this time  and continue efforts to see pt when medically possible. Reynolds, Mickel Baas, OT Acute Rehabilitation Services [phone]    11/18/2019, 11:20 AM

## 2019-11-18 NOTE — Progress Notes (Signed)
ANTICOAGULATION CONSULT NOTE - Initial Consult  Pharmacy Consult for Heparin Indication: pulmonary embolus  Allergies  Allergen Reactions  . Fish Allergy Hives  . Diclofenac Other (See Comments)  . Sulfa Antibiotics     Unknown reaction    Patient Measurements: Height: 5' (152.4 cm) Weight: 47.4 kg (104 lb 9.6 oz) IBW/kg (Calculated) : 45.5 Heparin Dosing Weight: n/a. Use TBW = 47.4 kg  Vital Signs: Temp: 98.7 F (37.1 C) (08/08 0700) Temp Source: Oral (08/08 0700) BP: 131/67 (08/08 0700) Pulse Rate: 71 (08/08 0700)  Labs: Recent Labs    11/16/19 1433 11/16/19 1433 11/16/19 2239 11/17/19 0500 11/17/19 0500 11/17/19 0756 11/17/19 1855 11/18/19 0546  HGB 13.3   < >  --  12.8   < > 12.4  --  12.8  HCT 40.8   < >  --  38.5  --  37.0  --  38.4  PLT 264   < >  --  276  --  265  --  293  APTT  --   --  31  --   --   --   --   --   LABPROT  --   --  13.4  --   --   --   --   --   INR  --   --  1.1  --   --   --   --   --   HEPARINUNFRC  --   --   --   --   --  0.80* 0.51 0.50  CREATININE 1.04*  --   --  0.90  --   --   --   --   TROPONINIHS  --   --   --  67*  --  73*  --   --    < > = values in this interval not displayed.    Estimated Creatinine Clearance: 26.9 mL/min (by C-G formula based on SCr of 0.9 mg/dL).   Medical History: Past Medical History:  Diagnosis Date  . Arthritis   . Chronic renal insufficiency, stage III (moderate)   . Depression   . Dyslipidemia   . Forgetfulness   . GERD (gastroesophageal reflux disease)    takes Omeprazole for Hital Hernia   . Hiatal hernia   . History of nuclear stress test 2005   persantine; inferoapical ischmeia w/ EF 83%; no CAD at cath  . HOH (hard of hearing)    hearing aids  . Hypertension   . Hypothyroidism   . LBBB (left bundle branch block)    chronic per Dr. Gwenlyn Found  . Lung nodule 07/01/03   right middle lobe  . Macular degeneration   . Malignant melanoma ([place]) 11/12/04   L upper arm, MOHS_ right hand,  leg  . Murmur, cardiac   . Osteoporosis   . Pneumonia 06/2018    Medications: No oral anticoagulation PTA  Assessment:  86 yr female presents with c/o right upper abdominal pain. Patient with known gallstones  CT = + acute bilateral PE including saddle embolus on the right. CT evidence right heart strain  Baseline labs: -Hgb 13.3, Plt 264, INR 1.1  Today, 11/18/19  Confirmatory HL = 0.50 remains therapeutic on heparin infusion of 650 units/hr  CBC: WNL & stable  Confirmed with RN that heparin infusing at correct rate. No issues with infusion. No signs/symptoms of bleeding.  Goal of Therapy:  Heparin level 0.3-0.7 units/ml Monitor platelets by anticoagulation protocol: Yes   Plan:  Continue heparin infusion at current rate of 650 units/hr  Follow daily heparin level and CBC  Monitor for signs/symptoms of bleeding  Lenis Noon, PharmD 11/18/2019,8:35 AM

## 2019-11-18 NOTE — Progress Notes (Signed)
Initial Nutrition Assessment  INTERVENTION:   -Ensure Enlive po BID, each supplement provides 350 kcal and 20 grams of protein -Multivitamin with minerals daily  NUTRITION DIAGNOSIS:   Inadequate oral intake related to poor appetite as evidenced by meal completion < 25% (chart review).  GOAL:   Patient will meet greater than or equal to 90% of their needs   MONITOR:   PO intake, Supplement acceptance, Labs, Weight trends, I & O's  REASON FOR ASSESSMENT:   Consult Assessment of nutrition requirement/status  ASSESSMENT:   86 year old woman who presented with abdominal pain, found to have submassive PE on CT of her abdomen.  History obtained from chart review and her caregiver [name] over the phone.  She has chronic memory issues and is not sure why she was brought to the hospital. Admitted for Acute submassive PE.  Pt with dementia and unable to provide much nutritional history.  Pt currently consuming ~20% of meals. Per chart review, pt has had decreased intakes recently d/t nausea. Recommend nutritional supplements, will order Ensure.  Per weight records, pt has lost 11 lbs since 03/05/19 (9% wt loss x 8 months, insignificant for time frame).   Labs reviewed. Medications: Colace  NUTRITION - FOCUSED PHYSICAL EXAM:  Unable to complete  Diet Order:   Diet Order            Diet Heart Room service appropriate? Yes; Fluid consistency: Thin  Diet effective now                 EDUCATION NEEDS:   No education needs have been identified at this time  Skin:  Skin Assessment: Reviewed RN Assessment  Last BM:  8/6  Height:   Ht Readings from Last 1 Encounters:  11/16/19 5' (1.524 m)    Weight:   Wt Readings from Last 1 Encounters:  11/16/19 47.4 kg    BMI:  Body mass index is 20.43 kg/m.  Estimated Nutritional Needs:   Kcal:  1250-1450  Protein:  60-70g  Fluid:  1.5L/day   Clayton Bibles, MS, RD, LDN Inpatient Clinical Dietitian Contact information  available via Amion

## 2019-11-19 ENCOUNTER — Inpatient Hospital Stay (HOSPITAL_COMMUNITY): Payer: Medicare Other

## 2019-11-19 DIAGNOSIS — I2699 Other pulmonary embolism without acute cor pulmonale: Secondary | ICD-10-CM

## 2019-11-19 LAB — CBC
HCT: 36.1 % (ref 36.0–46.0)
Hemoglobin: 12 g/dL (ref 12.0–15.0)
MCH: 32.8 pg (ref 26.0–34.0)
MCHC: 33.2 g/dL (ref 30.0–36.0)
MCV: 98.6 fL (ref 80.0–100.0)
Platelets: 277 10*3/uL (ref 150–400)
RBC: 3.66 MIL/uL — ABNORMAL LOW (ref 3.87–5.11)
RDW: 13.3 % (ref 11.5–15.5)
WBC: 10.6 10*3/uL — ABNORMAL HIGH (ref 4.0–10.5)
nRBC: 0 % (ref 0.0–0.2)

## 2019-11-19 LAB — HEPARIN LEVEL (UNFRACTIONATED): Heparin Unfractionated: 0.49 IU/mL (ref 0.30–0.70)

## 2019-11-19 MED ORDER — APIXABAN 5 MG PO TABS
10.0000 mg | ORAL_TABLET | Freq: Two times a day (BID) | ORAL | Status: DC
  Administered 2019-11-19: 10 mg via ORAL
  Filled 2019-11-19: qty 2

## 2019-11-19 MED ORDER — APIXABAN 5 MG PO TABS: 5.0000 mg | ORAL_TABLET | Freq: Two times a day (BID) | ORAL | Status: DC

## 2019-11-19 MED ORDER — APIXABAN (ELIQUIS) VTE STARTER PACK (10MG AND 5MG): ORAL_TABLET | ORAL | 0 refills | Status: DC

## 2019-11-19 MED ORDER — RIVAROXABAN 10 MG PO TABS: 10.0000 mg | ORAL_TABLET | Freq: Every day | ORAL | 0 refills | Status: DC

## 2019-11-19 MED ORDER — APIXABAN 5 MG PO TABS
ORAL_TABLET | ORAL | 0 refills | Status: DC
Start: 2019-11-19 — End: 2020-01-07

## 2019-11-19 MED ORDER — APIXABAN 5 MG PO TABS: 5.0000 mg | ORAL_TABLET | Freq: Two times a day (BID) | ORAL | 2 refills | Status: DC

## 2019-11-19 MED ORDER — SIMVASTATIN 20 MG PO TABS: 20.0000 mg | ORAL_TABLET | Freq: Every evening | ORAL | 1 refills | Status: DC

## 2019-11-19 NOTE — Discharge Instructions (Signed)
Information on my medicine - ELIQUIS (apixaban)  This medication education was reviewed with me or my healthcare representative as part of my discharge preparation.  The pharmacist that spoke with me during my hospital stay was:  Ahriana Gunkel A, RPH  Why was Eliquis prescribed for you? Eliquis was prescribed to treat blood clots that may have been found in the veins of your legs (deep vein thrombosis) or in your lungs (pulmonary embolism) and to reduce the risk of them occurring again.  What do You need to know about Eliquis ? The starting dose is 10 mg (two 5 mg tablets) taken TWICE daily for the FIRST SEVEN (7) DAYS, then on (enter date)  11/26/2019  the dose is reduced to ONE 5 mg tablet taken TWICE daily.  Eliquis may be taken with or without food.   Try to take the dose about the same time in the morning and in the evening. If you have difficulty swallowing the tablet whole please discuss with your pharmacist how to take the medication safely.  Take Eliquis exactly as prescribed and DO NOT stop taking Eliquis without talking to the doctor who prescribed the medication.  Stopping may increase your risk of developing a new blood clot.  Refill your prescription before you run out.  After discharge, you should have regular check-up appointments with your healthcare provider that is prescribing your Eliquis.    What do you do if you miss a dose? If a dose of ELIQUIS is not taken at the scheduled time, take it as soon as possible on the same day and twice-daily administration should be resumed. The dose should not be doubled to make up for a missed dose.  Important Safety Information A possible side effect of Eliquis is bleeding. You should call your healthcare provider right away if you experience any of the following: ? Bleeding from an injury or your nose that does not stop. ? Unusual colored urine (red or dark brown) or unusual colored stools (red or black). ? Unusual bruising  for unknown reasons. ? A serious fall or if you hit your head (even if there is no bleeding).  Some medicines may interact with Eliquis and might increase your risk of bleeding or clotting while on Eliquis. To help avoid this, consult your healthcare provider or pharmacist prior to using any new prescription or non-prescription medications, including herbals, vitamins, non-steroidal anti-inflammatory drugs (NSAIDs) and supplements.  This website has more information on Eliquis (apixaban): http://[email or web address].com/eliquis/home

## 2019-11-19 NOTE — TOC Initial Note (Signed)
Transition of Care [facility]) - Initial/Assessment Note    Patient Details  Name: Cindy [name] MRN: [id number] Date of Birth: 1923-02-20  Transition of Care (TOC) CM/SW Contact:    Joaquin Courts, RN Phone Number: 11/19/2019, 12:21 PM  Clinical Narrative:           CM called both of patient's children to discuss need for [facility] services.  Neither picked up the phone and voicemail left.  Patient's caregiver is present at bedside and reports patient is active with [facility] services.  CM notified agency rep of need for HHPT/OT/aide/RN/social work at discharge.         Expected Discharge Plan: [place] Barriers to Discharge: No Barriers Identified   Patient Goals and CMS Choice Patient states their goals for this hospitalization and ongoing recovery are:: to go home with home health CMS Medicare.gov Compare Post Acute Care list provided to:: Patient Represenative (must comment) Choice offered to / list presented to : Adult Children (care giver)  Expected Discharge Plan and Services Expected Discharge Plan: [place]   Discharge Planning Services: CM Consult Post Acute Care Choice: Resumption of Svcs/PTA Provider Living arrangements for the past 2 months: Single Family Home Expected Discharge Date: 11/19/19               DME Arranged: N/A DME Agency: NA       HH Arranged: PT, OT, RN, Nurse's Aide, Social Work [place] Agency: [facility] Date [place] Agency Contacted: 11/19/19 Time [place]: 1221 Representative spoke with at [place]: [place] Arrangements/Services Living arrangements for the past 2 months: [place]     Do you feel safe going back to the place where you live?: Yes               Activities of Daily Living Home Assistive Devices/Equipment: Environmental consultant (specify type) ADL Screening (condition at time of admission) Patient's cognitive ability adequate to safely complete daily activities?:  Yes Is the patient deaf or have difficulty hearing?: Yes Does the patient have difficulty seeing, even when wearing glasses/contacts?: No Does the patient have difficulty concentrating, remembering, or making decisions?: No Patient able to express need for assistance with ADLs?: Yes Does the patient have difficulty dressing or bathing?: No Independently performs ADLs?: No Communication: Independent Dressing (OT): Needs assistance Is this a change from baseline?: Pre-admission baseline Grooming: Independent Feeding: Independent Bathing: Needs assistance Is this a change from baseline?: Pre-admission baseline Toileting: Needs assistance Is this a change from baseline?: Pre-admission baseline In/Out Bed: Independent Walks in Home: Independent with device (comment) Does the patient have difficulty walking or climbing stairs?: Yes Weakness of Legs: Both Weakness of Arms/Hands: None  Permission Sought/Granted                  Emotional Assessment       Orientation: : Fluctuating Orientation (Suspected and/or reported [place])      Admission diagnosis:  Pulmonary embolism ([place]) [X73.53] Biliary colic symptom [G99.24] Pulmonary emboli ([place]) [I26.99] Acute pulmonary embolism, unspecified pulmonary embolism type, unspecified whether acute cor pulmonale present ([place]) [I26.99] Patient Active Problem List   Diagnosis Date Noted  . Pulmonary emboli ([place]) 11/17/2019  . Palliative care by specialist   . Goals of care, counseling/discussion   . General weakness   . Pulmonary embolism ([place]) 11/16/2019  . Dementia ([place]) 11/16/2019  . Hypothyroidism 11/16/2019  . Pancreatic mass 11/16/2019  . NSTEMI (non-ST  elevated myocardial infarction) ([place]) 03/05/2019  . Pain due to onychomycosis of toenails of both feet 10/03/2018  . H/O total shoulder replacement, left 08/04/2018  . Pneumonia 06/13/2018  . Pressure injury of skin 06/13/2018  . Abdominal pain 11/08/2016  . Acute lower  UTI 11/08/2016  . Acute cystitis without hematuria   . SBO (small bowel obstruction) ([place]) 06/28/2016  . Constipation, chronic 06/28/2016  . Acute distention of stomach 06/28/2016  . Eventration of left crus of diaphragm 06/27/2016  . Uncontrolled hypertension 06/27/2016  . Gastric outlet obstruction 06/27/2016  . CRI (chronic renal insufficiency), stage 4 (severe) ([place]) 06/14/2016  . Primary hypothyroidism 06/14/2016  . Osteoporosis, postmenopausal 06/14/2016  . OAB (overactive bladder) 02/25/2016  . Left bundle branch block 12/14/2012  . Essential hypertension 12/14/2012  . Hyperlipidemia 12/14/2012  . Pain in shoulder 11/11/2011  . Melanoma of left upper arm ([place]) 10/19/2011  . Lung mass 06/16/2011  . Acquired deformities of toe 06/14/2011  . Personal history of other diseases of the digestive system 06/07/2011  . GERD (gastroesophageal reflux disease) 06/07/2011   PCP:  Lorne Skeens, MD Pharmacy:   [facility] [phone] - [address], [address] AT [facility] OF [address] [place] [address] Phone: [phone] Fax: [phone]     Social Determinants of Health ([place]) Interventions    Readmission Risk Interventions No flowsheet data found.

## 2019-11-19 NOTE — Evaluation (Addendum)
Physical Therapy Evaluation-1x Patient Details Name: Cindy [name] MRN: [id number] DOB: [birth year] Today's Date: 11/19/2019   History of Present Illness  86 y/o female presented with abdominal pain. CT of abdomen revealed submassive PE. Hx of dementia, LBBB, renal insufficiency, osteoporosis, macular degeneration, L rev TSA 2020  Clinical Impression  On eval, pt was Min guard assist for mobility. Pt walked a short distance around room, with & without RW. Mild dyspnea with activity. Distance limited 2* pt incontinence and gown/briefs soiled and caregiver wanted to help pt get cleaned up and dressed. Discussed d/c plan-pt will return home with caregiver assisting as needed. Caregiver requested HHPT f/u so will recommend this. 1x eval with pt to d/c home today per chart.     Follow Up Recommendations Home health PT;Supervision/Assistance - 24 hour    Equipment Recommendations  None recommended by PT    Recommendations for Other Services       Precautions / Restrictions Precautions Precautions: Fall Restrictions Weight Bearing Restrictions: No      Mobility  Bed Mobility Overal bed mobility: Modified Independent             General bed mobility comments: Supine to sit: Mod I from partially elevated HOB with increased time.  Transfers Overall transfer level: Needs assistance Equipment used: Rolling walker (2 wheeled) Transfers: Sit to/from Stand Sit to Stand: Supervision         General transfer comment: Cues for safety, hand placement.  Ambulation/Gait Ambulation/Gait assistance: Min guard Gait Distance (Feet): 25 Feet Assistive device: Rolling walker (2 wheeled);None Gait Pattern/deviations: Step-through pattern;Decreased stride length     General Gait Details: Walked ~15 feet with RW then ~10 feet with no device/"furniture walk". Mild dyspnea.  Stairs            Wheelchair Mobility    Modified Rankin (Stroke Patients Only)       Balance Overall  balance assessment: Needs assistance Sitting-balance support: Single extremity supported Sitting balance-Leahy Scale: Fair Sitting balance - Comments: kyphotic   Standing balance support: Single extremity supported Standing balance-Leahy Scale: Fair                              Pertinent Vitals/Pain Pain Assessment: No/denies pain Faces Pain Scale: Hurts little more Pain Location: LT knee after sit to stand test. Pain Intervention(s): Limited activity within patient's tolerance;Repositioned;Monitored during session    [place] expects to be discharged to:: Private residence Living Arrangements: Non-relatives/Friends Available Help at Discharge: Personal care attendant;Available 24 hours/day (CG, Roger at home from early AM to when pt goes to bed. Pt stated that [name] can stay 24/7 if needed.) Type of Home: House Home Access: Ramped entrance     Home Layout: One level Home Equipment: Bedside commode;Shower seat;Cane - single point;Walker - 2 wheels      Prior Function Level of Independence: Needs assistance   Gait / Transfers Assistance Needed: supervision for ambulation  ADL's / Homemaking Assistance Needed: assist for bathing/dressing as needed by caregiver. [name] also completes all IADLs including housekeeping, laundry and cooking, and provides transportation.  Pt has a medical alert bracelet when she is home alone.        Hand Dominance   Dominant Hand: Right    Extremity/Trunk Assessment   Upper Extremity Assessment Upper Extremity Assessment: Generalized weakness (h/o RT rTSA ~08/2018 with limited shoulder ROM. Otherwise WFL for age)    Lower Extremity Assessment Lower Extremity Assessment: Generalized  weakness    Cervical / Trunk Assessment Cervical / Trunk Assessment: Kyphotic  Communication   Communication: No difficulties  Cognition Arousal/Alertness: Awake/alert Behavior During Therapy: WFL for tasks  assessed/performed Overall Cognitive Status: History of cognitive impairments - at baseline                                 General Comments: Pt oriented to person and place.  Disoriented to month, year and situation.  Pleasant and cooperative.      General Comments      Exercises     Assessment/Plan    PT Assessment All further PT needs can be met in the next venue of care [facility])  PT Problem List Decreased mobility;Decreased activity tolerance       PT Treatment Interventions      PT Goals (Current goals can be found in the Care Plan section)  Acute Rehab PT Goals Patient Stated Goal: Home today PT Goal Formulation: All assessment and education complete, DC therapy    Frequency     Barriers to discharge        Co-evaluation               AM-PAC PT "6 Clicks" Mobility  Outcome Measure Help needed turning from your back to your side while in a flat bed without using bedrails?: None Help needed moving from lying on your back to sitting on the side of a flat bed without using bedrails?: None Help needed moving to and from a bed to a chair (including a wheelchair)?: A Little Help needed standing up from a chair using your arms (e.g., wheelchair or bedside chair)?: A Little Help needed to walk in hospital room?: A Little Help needed climbing 3-5 steps with a railing? : A Little 6 Click Score: 20    End of Session Equipment Utilized During Treatment: Gait belt Activity Tolerance: Patient tolerated treatment well Patient left: in bed;with call bell/phone within reach;with family/visitor present   PT Visit Diagnosis: Unsteadiness on feet (R26.81)    Time: 1093-2355 PT Time Calculation (min) (ACUTE ONLY): 11 min   Charges:   PT Evaluation $PT Eval Low Complexity: 1 Low             Doreatha Massed, PT Acute Rehabilitation  Office: [phone] Pager: [phone]

## 2019-11-19 NOTE — Progress Notes (Signed)
ANTICOAGULATION CONSULT NOTE - Initial Consult  Pharmacy Consult for Heparin Indication: pulmonary embolus  Allergies  Allergen Reactions  . Fish Allergy Hives  . Diclofenac Other (See Comments)  . Sulfa Antibiotics     Unknown reaction    Patient Measurements: Height: 5' (152.4 cm) Weight: 47.4 kg (104 lb 9.6 oz) IBW/kg (Calculated) : 45.5 Heparin Dosing Weight: n/a. Use TBW = 47.4 kg  Vital Signs: Temp: 98.1 F (36.7 C) (08/09 0605) Temp Source: Oral (08/09 0605) BP: 140/69 (08/09 0605) Pulse Rate: 59 (08/09 0605)  Labs: Recent Labs    11/16/19 1433 11/16/19 1433 11/16/19 2239 11/17/19 0500 11/17/19 0500 11/17/19 0756 11/17/19 0756 11/17/19 1855 11/18/19 0546 11/19/19 0517  HGB 13.3   < >  --  12.8   < > 12.4   < >  --  12.8 12.0  HCT 40.8   < >  --  38.5   < > 37.0  --   --  38.4 36.1  PLT 264   < >  --  276   < > 265  --   --  293 277  APTT  --   --  31  --   --   --   --   --   --   --   LABPROT  --   --  13.4  --   --   --   --   --   --   --   INR  --   --  1.1  --   --   --   --   --   --   --   HEPARINUNFRC  --   --   --   --   --  0.80*   < > 0.51 0.50 0.49  CREATININE 1.04*  --   --  0.90  --   --   --   --   --   --   TROPONINIHS  --   --   --  67*  --  73*  --   --   --   --    < > = values in this interval not displayed.    Estimated Creatinine Clearance: 26.9 mL/min (by C-G formula based on SCr of 0.9 mg/dL).   Medications: No oral anticoagulation PTA  Assessment:  86 yr female presents with c/o right upper abdominal pain. Patient with known gallstones  CT = + acute bilateral PE including saddle embolus on the right. CT evidence right heart strain  Baseline labs: -Hgb 13.3, Plt 264, INR 1.1  Today, 11/19/19  AM Heparin level is therapeutic on 650 units/hr  CBC: WNL & stable   Confirmed with RN that heparin infusing at correct rate. No issues with infusion. No signs/symptoms of bleeding.  Transition to Eliquis today   Goal of  Therapy:  Heparin level 0.3-0.7 units/ml Monitor platelets by anticoagulation protocol: Yes   Plan:   Transition from heparin infusion to Eliquis 10 mg BID x 7 days then 5 mg BID thereafter   Stop heparin infusion at time of first dose of Eliquis  Pharmacy to provide edu prior to discharge  Monitor for signs/symptoms of bleeding  Bertis Ruddy, PharmD 11/19/2019,10:12 AM

## 2019-11-19 NOTE — Discharge Summary (Addendum)
Physician Discharge Summary [facility]    Patient: Cindy [name]                   Admit date: 11/16/2019   DOB: 08-14-1922             Discharge date:11/19/2019/12:32 PM QZE:[id number]                          PCP: Lorne Skeens, MD  Disposition: HOME   Recommendations for Outpatient Follow-up:   . Follow up: in 1 week with pulmonary team.  Dr. Carlis Abbott.  Discharge Condition: Stable   Code Status:   Code Status: DNR  Diet recommendation: Regular healthy diet   Discharge Diagnoses:    Active Problems:   Left bundle branch block   Essential hypertension   Hyperlipidemia   CRI (chronic renal insufficiency), stage 4 (severe) (HCC)   GERD (gastroesophageal reflux disease)   Pulmonary embolism (HCC)   Dementia (HCC)   Hypothyroidism   Pancreatic mass   Pulmonary emboli ([place])   Palliative care by specialist   Goals of care, counseling/discussion   General weakness   History of Present Illness/ Hospital Course Kathleen Argue Summary:    Maurica Omura a 86 y.o.femalewith medical history significant of CAD,chronic renal insufficiency stage 4,chronic left bundle branch block, dementia, arthritis, HLD, GERD, hypothyroidism. Presented withRUQ pain and Chest pain She had had decreased p.o. intake. Reports sharp pain in her right upper quadrant. Has been vaccinated against COVID  Initial COVID TEST   ER: Initially was worked up for gallstone disease which was unremarkable US showed no cholecystitis CT abd showed incidental bilateral pulmonary embolism, right-sided embolism. Subsequently admitted for observation, started on heparin drip... Patient was assessed thoroughly, observe entirely no decompensation, remained on room air denies any chest pain or shortness of breath, heparin was switched to p.o. Eliquis, Patient was seen and evaluated by pulmonary critical team.   Detailed discharge summary/A&P     Pulmonary embolism (HCC)/ BL pulmonary  embolism - Right saddle emboli -Remains hemodynamically stable, on room air, denies any chest pain or shortness of breath  On room air satting 96% -Remains mildly tachypneic with respiratory rate of 24 -We will continue heparin gtt -Appreciate pulmonary critical care following -no indication for direct lytic treatment -CT angiogram: Bilateral pleural embolism, sonolucent  -Heparin drip was continued till 11/19/2019, switched to Eliquis 10 mg p.o. twice daily x7 days then 5 mg p.o. daily --- per recommendation from pulmonary critical care team  -Mildly elevated troponin recycling, monitoring closely 67, 73, -2D Echocardiogram >>65 to 70%.  Left LV function normal, reduced right LV function, with mild strain -Bilateral extremity Doppler study >> negative for DVTs  Most likely risk factors for hypercoagulable state being sedentary lifestyle unknown large PE but no evidence of decompensation, or right heart strain  CKD stage 4 -Monitoring BUN/creatinine closely, currently stable Baseline creatinine around 1 -Creatinine 1.04 >>> 0.90 BUN 29, 25, Continue to monitor this could be actually due to muscle wasting.  Patient received IV contrast for her studies.    Essential hypertension/accelerated hypertension -We will allow for mild hypertensive state -No new blood pressure medication, as needed hydralazine, s -Avoid extensive hypotensive state   leukocytosis -Monitoring, likely reactive to PE -No signs of infection (afebrile, normotensive) -Improved  .GERD(gastroesophageal reflux disease)stable continue home meds  .Hyperlipidemia -chronic stable continue Zocor  .Left bundle branch block-chronic stable  .Dementia (HCC)-chronic stable continue to monitor for any signs of  sundowning  .Hypothyroidism --normal TSH continue home medications at current dose   Consultants: Pulmonary critical care  team    ----------------------------------------------------------------------------------------------------------------------------   Code Status:   Code Status: DNR Family Communication:  Caregiver Mr. [name] was updated  attempting to reach family members, caregiver Mr. [name] The above findings and plan of care has been discussed with patient and Mr. [name] in detail.  -Advance care planning has been discussed.      Nutritional status:  Nutrition Problem: Inadequate oral intake Etiology: poor appetite Signs/Symptoms: meal completion < 25% (chart review) Interventions: Ensure Enlive (each supplement provides 350kcal and 20 grams of protein)   Discharge Instructions:   Discharge Instructions    Activity as tolerated - No restrictions   Complete by: As directed    Call MD for:  difficulty breathing, headache or visual disturbances   Complete by: As directed    Diet - low sodium heart healthy   Complete by: As directed    Discharge instructions   Complete by: As directed    Because of side effects of anticoagulation with Eliquis with increased risk of bleed especially GI bleed. But at this point due to extensive of pulmonary embolism benefit outweighs the risk. Follow-up with pulmonary team   Increase activity slowly   Complete by: As directed        Medication List    STOP taking these medications   aspirin 81 MG tablet   cetirizine 10 MG tablet Commonly known as: ZYRTEC   HYDROcodone-acetaminophen 5-325 MG tablet Commonly known as: NORCO/VICODIN   Potassium Chloride ER 20 MEQ Tbcr     TAKE these medications   acetaminophen 500 MG tablet Commonly known as: TYLENOL Take 500 mg by mouth in the morning, at noon, and at bedtime.   acetaminophen 325 MG tablet Commonly known as: TYLENOL Take 1-2 tablets (325-650 mg total) by mouth every 6 (six) hours as needed for mild pain or moderate pain (pain score 1-3 or temp > 100.5).   Apixaban Starter Pack (10mg   and 5mg ) Commonly known as: ELIQUIS STARTER PACK Take as directed on package: start with two-5mg  tablets twice daily for 7 days. On day 8, switch to one-5mg  tablet twice daily.   ciprofloxacin 250 MG tablet Commonly known as: CIPRO Take 250 mg by mouth as directed. Take PRN for Kidney Infection   clobetasol 0.05 % external solution Commonly known as: TEMOVATE Apply 1 application topically 2 (two) times a week.   CRANBERRY PO Take 1 tablet by mouth daily.   fluocinolone 0.025 % cream Commonly known as: SYNALAR Apply 1 application topically 2 (two) times daily.   glucosamine-chondroitin 500-400 MG tablet Take 1 tablet by mouth daily.   hydrocortisone 2.5 % cream Apply 1 application topically 2 (two) times daily as needed (dry skin).   hydroxypropyl methylcellulose / hypromellose 2.5 % ophthalmic solution Commonly known as: ISOPTO TEARS / GONIOVISC Place 1 drop into both eyes daily.   ipratropium 0.06 % nasal spray Commonly known as: ATROVENT Place 1-2 sprays into both nostrils every 6 (six) hours as needed (rhinorrhea).   ketoconazole 2 % shampoo Commonly known as: NIZORAL Apply 1 application topically 3 (three) times a week.   levothyroxine 75 MCG tablet Commonly known as: SYNTHROID Take 75 mcg by mouth daily before breakfast.   Myrbetriq 50 MG Tb24 tablet Generic drug: mirabegron ER Take 50 mg by mouth daily.   nitrofurantoin 100 MG capsule Commonly known as: MACRODANTIN Take 100 mg by mouth as directed. Take PRN  for Kidney Infection   Ocuvite Extra Tabs Take 1 tablet by mouth daily. What changed: Another medication with the same name was removed. Continue taking this medication, and follow the directions you see here.   Omega-3 1000 MG Caps Take 1,000 mg by mouth 2 (two) times a day.   omega-3 acid ethyl esters 1 g capsule Commonly known as: LOVAZA Take 1 g by mouth 2 (two) times daily.   omeprazole 20 MG capsule Commonly known as: PRILOSEC Take 20 mg by  mouth daily.   polyethylene glycol 17 g packet Commonly known as: MiraLax Take 17 g by mouth daily.   simvastatin 20 MG tablet Commonly known as: ZOCOR Take 1 tablet (20 mg total) by mouth every evening. What changed:   medication strength  how much to take   solifenacin 10 MG tablet Commonly known as: VESICARE Take 10 mg by mouth daily.   traMADol 50 MG tablet Commonly known as: ULTRAM Take 50 mg by mouth every 8 (eight) hours as needed for moderate pain or severe pain.   triamcinolone cream 0.1 % Commonly known as: KENALOG Apply 1 application topically 2 (two) times daily.   Triamcinolone Acetonide 0.025 % Lotn Apply 1 application topically 2 (two) times daily as needed (scalp).       Follow-up Information    [facility]   Specialty: Home Health Services Why: agency will provide home health services. Contact information: [address] [place] [address] [place]  [phone]              Allergies  Allergen Reactions  . Fish Allergy Hives  . Diclofenac Other (See Comments)  . Sulfa Antibiotics     Unknown reaction     Procedures /Studies:   CT Angio Chest PE W and/or Wo Contrast  Result Date: 11/16/2019 CLINICAL DATA:  Possible PE on CT abdomen pelvis, right upper quadrant pain EXAM: CT ANGIOGRAPHY CHEST WITH CONTRAST TECHNIQUE: Multidetector CT imaging of the chest was performed using the standard protocol during bolus administration of intravenous contrast. Multiplanar CT image reconstructions and MIPs were obtained to evaluate the vascular anatomy. CONTRAST:  56 mL OMNIPAQUE IOHEXOL 350 MG/ML SOLN COMPARISON:  CT 11/16/2019, chest CT 08/04/2011 FINDINGS: Cardiovascular: Satisfactory opacification of the pulmonary arteries to the segmental level. Acute thrombus within right upper lobe segmental pulmonary vessels with evidence of saddle embolus extending from right upper lobe pulmonary vessels to the right descending pulmonary  artery and right lower lobe pulmonary arteries. Occlusive appearing thrombus within right middle lobe pulmonary vessels. Extension of embolus into right lower lobe segmental pulmonary vessels. Suspected moderate thrombus along the posterior wall of the left pulmonary artery, with extension of thrombus into left descending lobar and left lower lobe segmental vessels. RV LV ratio of 1.7. Nonaneurysmal aorta. Moderate aortic atherosclerosis. Coronary vascular calcification. Borderline cardiomegaly. No pericardial effusion. Mediastinum/Nodes: Midline trachea. No thyroid mass. Borderline left paratracheal lymph node measuring 12 mm. Esophagus within normal limits. Lungs/Pleura: Trace bilateral pleural effusions. Bronchiectasis within the right upper lobe and left greater than right lower lobes. Chronic consolidation in the left lower lobe with slight increased superimposed airspace disease. Chronic elevation of left diaphragm. 14 mm right middle lobe pulmonary nodule, series 7, image number 67. Upper Abdomen: Herniation of most of the stomach, in addition to colon and mesentery into the left lower chest as before. Musculoskeletal: Diffuse degenerative changes. No acute or suspicious osseous abnormality. Review of the MIP images confirms the above findings. IMPRESSION:  1. Positive for acute bilateral pulmonary emboli including saddle embolus on the right. Positive for acute PE with CTevidence of right heart strain (RV/LV Ratio = 1.7) consistent with at least submassive (intermediate risk) PE. The presence of right heart strain has been associated with an increased risk of morbidity and mortality. 2. Chronic elevation of the left diaphragm with large left-sided hernia. Chronic consolidation in the left lung base likely due to atelectasis, there is slightly increased airspace disease at the left base and a component of superimposed infarct is difficult to exclude. There is left greater than right lower lobe bronchiectasis  which is also chronic. 3. Small bilateral right greater than left pleural effusion. 4. 14 mm right middle lobe pulmonary nodule, slight increase in size compared with 2013 study at which time the nodule measured 11 mm. Critical test result of pulmonary embolus previously called to Dr. Langston Masker at the time of previously reported CT abdomen and pelvis. Aortic Atherosclerosis (ICD10-I70.0). Electronically Signed   By: Donavan Foil M.D.   On: 11/16/2019 23:33   CT ABDOMEN PELVIS W CONTRAST  Result Date: 11/16/2019 CLINICAL DATA:  Abdominal pain with leukocytosis EXAM: CT ABDOMEN AND PELVIS WITH CONTRAST TECHNIQUE: Multidetector CT imaging of the abdomen and pelvis was performed using the standard protocol following bolus administration of intravenous contrast. CONTRAST:  67mL OMNIPAQUE IOHEXOL 300 MG/ML  SOLN COMPARISON:  CT 03/05/2019, 02/18/2019, 11/08/2016 FINDINGS: Lower chest: Small right-sided pleural effusion 14 mm right middle lobe pulmonary nodule near the hilum, series 6, image number 8, previously 13 mm. Large left-sided hernia containing stomach, mesentery and colon. Incompletely visualized filling defects within the distal right pulmonary artery, extending into the right lower lobe pulmonary arteries consistent with acute pulmonary embolus. Hepatobiliary: No focal liver abnormality is seen. Punctate stones within the gallbladder. No biliary dilatation. Pancreas: Atrophic. No inflammatory change. 1.6 x 2.5 cm cystic lesion near the uncinate process of the pancreas. Spleen: Normal in size without focal abnormality. Adrenals/Urinary Tract: Adrenal glands are normal. There are cysts within the bilateral kidneys with additional subcentimeter hypodense lesions too small to further characterize. Urinary bladder is unremarkable Stomach/Bowel: Stomach herniated into the left chest. No dilated small bowel. No bowel wall thickening. Negative appendix. Descending and sigmoid colon diverticular disease without acute  inflammatory change. Vascular/Lymphatic: Moderate severe aortic atherosclerosis. No aneurysm. No suspicious nodes. Reproductive: Status post hysterectomy. No adnexal masses. Other: Negative for free air or free fluid. Musculoskeletal: Degenerative changes of the lumbar spine. Anterolisthesis L4 on L5, L3 on L4 with retrolisthesis L2 on L1. No acute osseous abnormality. IMPRESSION: 1. Incompletely visualized filling defects within the distal right pulmonary artery, extending into the right lower lobe pulmonary arteries consistent with acute pulmonary embolus. 2. Small right-sided pleural effusion. 14 mm right middle lobe pulmonary nodule, previously 13 mm. 3. Large left-sided hernia containing stomach, mesentery, and colon. No evidence for bowel obstruction. 4. Colonic diverticular disease without acute inflammatory change. 5. Cholelithiasis. 6. 2.5 cm cystic lesion near the uncinate process of the pancreas, intermittently visualized, slowly enlarging over time. Recommend clinical management given patient age. Critical Value/emergent results were called by telephone at the time of interpretation on 11/16/2019 at 10:23 pm to provider MATTHEW TRIFAN , who verbally acknowledged these results. Aortic Atherosclerosis (ICD10-I70.0). Electronically Signed   By: Donavan Foil M.D.   On: 11/16/2019 22:23   ECHOCARDIOGRAM COMPLETE  Result Date: 11/17/2019    ECHOCARDIOGRAM REPORT   Patient Name:   LETASHA KERSHAW Date of Exam: 11/17/2019 Medical Rec #:  [id number]    Height:       60.0 in Accession #:    [id number]   Weight:       104.6 lb Date of Birth:  [birth year]    BSA:          1.417 m Patient Age:    89 years     BP:           141/72 mmHg Patient Gender: F            HR:           74 bpm. Exam Location:  Inpatient Procedure: 2D Echo and Intracardiac Opacification Agent Indications:    Pulmonary Embolus I26.99  History:        Patient has prior history of Echocardiogram examinations, most                 recent 03/06/2019.  Risk Factors:Hypertension and Dyslipidemia.  Sonographer:    Mikki Santee RDCS (AE) Referring Phys: [place]  1. Left ventricular ejection fraction, by estimation, is 65 to 70%. The left ventricle has normal function. The left ventricle has no regional wall motion abnormalities. Left ventricular diastolic parameters are consistent with Grade I diastolic dysfunction (impaired relaxation).  2. Right ventricular systolic function is moderately reduced. The right ventricular size is normal. Tricuspid regurgitation signal is inadequate for assessing PA pressure.  3. The mitral valve is normal in structure. Trivial mitral valve regurgitation. No evidence of mitral stenosis.  4. The aortic valve was not well visualized. There is moderate thickening and moderate calcification of the aortic valve. Aortic valve regurgitation is mild. Mild to moderate aortic valve sclerosis/calcification is present, without any evidence of aortic stenosis. Aortic regurgitation PHT measures 717 msec. FINDINGS  Left Ventricle: Left ventricular ejection fraction, by estimation, is 65 to 70%. The left ventricle has normal function. The left ventricle has no regional wall motion abnormalities. The left ventricular internal cavity size was normal in size. There is  no left ventricular hypertrophy. Abnormal (paradoxical) septal motion, consistent with left bundle branch block. Left ventricular diastolic parameters are consistent with Grade I diastolic dysfunction (impaired relaxation). Indeterminate filling pressures. Right Ventricle: The right ventricular size is normal. No increase in right ventricular wall thickness. Right ventricular systolic function is moderately reduced. Tricuspid regurgitation signal is inadequate for assessing PA pressure. Left Atrium: Left atrial size was normal in size. Right Atrium: Right atrial size was normal in size. Pericardium: There is no evidence of pericardial effusion. Mitral  Valve: The mitral valve is normal in structure. There is mild calcification of the mitral valve leaflet(s). Normal mobility of the mitral valve leaflets. Mild mitral annular calcification. Trivial mitral valve regurgitation. No evidence of mitral valve stenosis. Tricuspid Valve: The tricuspid valve is normal in structure. Tricuspid valve regurgitation is not demonstrated. No evidence of tricuspid stenosis. Aortic Valve: The aortic valve was not well visualized. . There is moderate thickening and moderate calcification of the aortic valve. Aortic valve regurgitation is mild. Aortic regurgitation PHT measures 717 msec. Mild to moderate aortic valve sclerosis/calcification is present, without any evidence of aortic stenosis. There is moderate thickening of the aortic valve. There is moderate calcification of the aortic valve. Pulmonic Valve: The pulmonic valve was normal in structure. Pulmonic valve regurgitation is not visualized. No evidence of pulmonic stenosis. Aorta: The aortic root is normal in size and structure. IAS/Shunts: No atrial level shunt detected by color flow Doppler.  LEFT VENTRICLE PLAX 2D LVIDd:  2.60 cm  Diastology LVIDs:         1.70 cm  LV e' medial:   3.08 cm/s LV PW:         1.00 cm  LV E/e' medial: 18.1 LV IVS:        1.00 cm LVOT diam:     2.00 cm LV SV:         66 LV SV Index:   47 LVOT Area:     3.14 cm  RIGHT VENTRICLE TAPSE (M-mode): 1.3 cm LEFT ATRIUM             Index       RIGHT ATRIUM          Index LA diam:        3.90 cm 2.75 cm/m  RA Area:     8.82 cm LA Vol (A2C):   38.3 ml 27.03 ml/m RA Volume:   17.30 ml 12.21 ml/m LA Vol (A4C):   20.5 ml 14.47 ml/m LA Biplane Vol: 30.3 ml 21.38 ml/m  AORTIC VALVE LVOT Vmax:   121.00 cm/s LVOT Vmean:  84.800 cm/s LVOT VTI:    0.210 m AI PHT:      717 msec  AORTA Ao Root diam: 3.10 cm MITRAL VALVE MV Area (PHT): 2.69 cm    SHUNTS MV Decel Time: 282 msec    Systemic VTI:  0.21 m MV E velocity: 55.80 cm/s  Systemic Diam: 2.00 cm MV A  velocity: 99.90 cm/s MV E/A ratio:  0.56 Fransico Him MD Electronically signed by Fransico Him MD Signature Date/Time: 11/17/2019/2:48:53 PM    Final    VAS [place] LOWER EXTREMITY VENOUS (DVT)  Result Date: 11/19/2019  Lower Venous DVTStudy Indications: Pulmonary embolism.  Risk Factors: Confirmed PE. Anticoagulation: Heparin. Limitations: Body habitus and poor ultrasound/tissue interface. Comparison Study: No prior studies. Performing Technologist: Oliver Hum RVT  Examination Guidelines: A complete evaluation includes B-mode imaging, spectral Doppler, color Doppler, and power Doppler as needed of all accessible portions of each vessel. Bilateral testing is considered an integral part of a complete examination. Limited examinations for reoccurring indications may be performed as noted. The reflux portion of the exam is performed with the patient in reverse Trendelenburg.  +---------+---------------+---------+-----------+----------+--------------+ RIGHT    CompressibilityPhasicitySpontaneityPropertiesThrombus Aging +---------+---------------+---------+-----------+----------+--------------+ CFV      Full           Yes      Yes                                 +---------+---------------+---------+-----------+----------+--------------+ SFJ      Full                                                        +---------+---------------+---------+-----------+----------+--------------+ FV Prox  Full                                                        +---------+---------------+---------+-----------+----------+--------------+ FV Mid   Full                                                        +---------+---------------+---------+-----------+----------+--------------+  FV DistalFull                                                        +---------+---------------+---------+-----------+----------+--------------+ PFV      Full                                                         +---------+---------------+---------+-----------+----------+--------------+ POP      Full           Yes      Yes                                 +---------+---------------+---------+-----------+----------+--------------+ PTV      Full                                                        +---------+---------------+---------+-----------+----------+--------------+ PERO     Full                                                        +---------+---------------+---------+-----------+----------+--------------+   +---------+---------------+---------+-----------+----------+--------------+ LEFT     CompressibilityPhasicitySpontaneityPropertiesThrombus Aging +---------+---------------+---------+-----------+----------+--------------+ CFV      Full           Yes      Yes                                 +---------+---------------+---------+-----------+----------+--------------+ SFJ      Full                                                        +---------+---------------+---------+-----------+----------+--------------+ FV Prox  Full                                                        +---------+---------------+---------+-----------+----------+--------------+ FV Mid   Full                                                        +---------+---------------+---------+-----------+----------+--------------+ FV DistalFull                                                        +---------+---------------+---------+-----------+----------+--------------+  PFV      Full                                                        +---------+---------------+---------+-----------+----------+--------------+ POP      Full           Yes      Yes                                 +---------+---------------+---------+-----------+----------+--------------+ PTV      Full                                                         +---------+---------------+---------+-----------+----------+--------------+ PERO     Full                                                        +---------+---------------+---------+-----------+----------+--------------+     Summary: RIGHT: - There is no evidence of deep vein thrombosis in the lower extremity.  - No cystic structure found in the popliteal fossa.  LEFT: - There is no evidence of deep vein thrombosis in the lower extremity.  - No cystic structure found in the popliteal fossa.  *See table(s) above for measurements and observations.    Preliminary    US Abdomen Limited RUQ  Result Date: 11/16/2019 CLINICAL DATA:  Biliary colic. EXAM: ULTRASOUND ABDOMEN LIMITED RIGHT UPPER QUADRANT COMPARISON:  None. FINDINGS: Gallbladder: Nonmobile, shadowing echogenic gallstones are seen within the gallbladder fundus. There is no evidence of gallbladder wall thickening (2 mm). No sonographic Murphy sign noted by sonographer. Common bile duct: Diameter: 2.5 mm Liver: No focal lesion identified. Within normal limits in parenchymal echogenicity. Portal vein is patent on color Doppler imaging with normal direction of blood flow towards the liver. Other: Of incidental note is the presence of a right-sided pleural effusion. IMPRESSION: 1. Cholelithiasis, without evidence of acute cholecystitis. 2. Right pleural effusion. Electronically Signed   By: Virgina Norfolk M.D.   On: 11/16/2019 19:53    Subjective:   Patient was seen and examined 11/19/2019, 12:32 PM Patient stable today. No acute distress.  No issues overnight Stable for discharge.  Discharge Exam:    Vitals:   11/18/19 2036 11/18/19 2102 11/19/19 0605 11/19/19 1027  BP: (!) 104/53  140/69 128/60  Pulse: 64  (!) 59   Resp: (!) 28 18 (!) 21   Temp: 97.6 F (36.4 C)  98.1 F (36.7 C)   TempSrc: Oral  Oral   SpO2: 98%  95%   Weight:      Height:        General: Pt lying comfortably in bed & appears in no obvious  distress. Cardiovascular: S1 & S2 heard, RRR, S1/S2 +. No murmurs, rubs, gallops or clicks. No JVD or pedal edema. Respiratory: Clear to auscultation without wheezing, rhonchi or crackles. No increased work of breathing. Abdominal:  Non-distended, non-tender & soft. No organomegaly or masses  appreciated. Normal bowel sounds heard. CNS: Alert and oriented. No focal deficits. Extremities: no edema, no cyanosis    The results of significant diagnostics from this hospitalization (including imaging, microbiology, ancillary and laboratory) are listed below for reference.      Microbiology:   Recent Results (from the past 240 hour(s))  SARS Coronavirus 2 by RT PCR (hospital order, performed in [facility] hospital lab) Nasopharyngeal Nasopharyngeal Swab     Status: None   Collection Time: 11/16/19 10:59 PM   Specimen: Nasopharyngeal Swab  Result Value Ref Range Status   SARS Coronavirus 2 NEGATIVE NEGATIVE Final    Comment: (NOTE) SARS-CoV-2 target nucleic acids are NOT DETECTED.  The SARS-CoV-2 RNA is generally detectable in upper and lower respiratory specimens during the acute phase of infection. The lowest concentration of SARS-CoV-2 viral copies this assay can detect is 250 copies / mL. A negative result does not preclude SARS-CoV-2 infection and should not be used as the sole basis for treatment or other patient management decisions.  A negative result may occur with improper specimen collection / handling, submission of specimen other than nasopharyngeal swab, presence of viral mutation(s) within the areas targeted by this assay, and inadequate number of viral copies (<250 copies / mL). A negative result must be combined with clinical observations, patient history, and epidemiological information.  Fact Sheet for Patients:   StrictlyIdeas.no  Fact Sheet for Healthcare Providers: BankingDealers.co.za  This test is not yet approved  or  cleared by the [place] FDA and has been authorized for detection and/or diagnosis of SARS-CoV-2 by FDA under an Emergency Use Authorization (EUA).  This EUA will remain in effect (meaning this test can be used) for the duration of the COVID-19 declaration under Section 564(b)(1) of the Act, 21 U.S.C. section 360bbb-3(b)(1), unless the authorization is terminated or revoked sooner.  Performed at [facility], [place] [address], [place], [place] [address]      Labs:   CBC: Recent Labs  Lab 11/16/19 1433 11/17/19 0500 11/17/19 0756 11/18/19 0546 11/19/19 0517  WBC 14.3* 12.6* 12.9* 16.3* 10.6*  NEUTROABS  --  8.4*  --   --   --   HGB 13.3 12.8 12.4 12.8 12.0  HCT 40.8 38.5 37.0 38.4 36.1  MCV 100.2* 99.7 98.9 97.7 98.6  PLT 264 276 265 293 295   Basic Metabolic Panel: Recent Labs  Lab 11/16/19 1433 11/17/19 0500  NA 142 140  K 4.1 4.0  CL 108 107  CO2 24 22  GLUCOSE 117* 90  BUN 29* 25*  CREATININE 1.04* 0.90  CALCIUM 9.0 8.7*  MG  --  1.9  PHOS  --  2.7   Liver Function Tests: Recent Labs  Lab 11/16/19 1433 11/17/19 0500  AST 15 19  ALT 14 15  ALKPHOS 53 50  BILITOT 0.8 1.1  PROT 7.1 6.6  ALBUMIN 3.7 3.3*   BNP (last 3 results) Recent Labs    11/17/19 0756  BNP 409.5*   Cardiac Enzymes: No results for input(s): CKTOTAL, CKMB, CKMBINDEX, TROPONINI in the last 168 hours. CBG: No results for input(s): GLUCAP in the last 168 hours. Hgb A1c No results for input(s): HGBA1C in the last 72 hours. Lipid Profile No results for input(s): CHOL, HDL, LDLCALC, TRIG, CHOLHDL, LDLDIRECT in the last 72 hours. Thyroid function studies Recent Labs    11/17/19 0500  TSH 1.833   Anemia work up No results for input(s): VITAMINB12, FOLATE, FERRITIN, TIBC, IRON, RETICCTPCT in the  last 72 hours. Urinalysis    Component Value Date/Time   COLORURINE YELLOW 11/16/2019 1433   APPEARANCEUR HAZY (A) 11/16/2019 1433   LABSPEC 1.045 (H)  11/16/2019 1433   PHURINE 6.0 11/16/2019 1433   GLUCOSEU NEGATIVE 11/16/2019 1433   HGBUR SMALL (A) 11/16/2019 1433   BILIRUBINUR NEGATIVE 11/16/2019 1433   KETONESUR 5 (A) 11/16/2019 1433   PROTEINUR 30 (A) 11/16/2019 1433   UROBILINOGEN 0.2 11/22/2013 2218   NITRITE NEGATIVE 11/16/2019 1433   LEUKOCYTESUR TRACE (A) 11/16/2019 1433         Time coordinating discharge: Over 45 minutes  SIGNED: Deatra James, MD, FACP, [facility]. [facility],  Please use [email or web address] to Page If 7PM-7AM, please contact night-coverage [email or web address] Ota [facility] 11/19/2019, 12:32 PM

## 2019-11-19 NOTE — Progress Notes (Signed)
No change from am assessment. Patient remains stable alert,oriented(x4). Discharge instructions reviewed with pt and caregiver. Questions concerns denied. Np follow up visit with PCP listed on instructions. Caregiver was encouraged to call for follow up with Primary provider.

## 2019-11-19 NOTE — Progress Notes (Signed)
Bilateral lower extremity venous duplex has been completed. Preliminary results can be found in CV Proc through chart review.   11/19/19 9:44 AM Cindy [name] RVT

## 2019-11-19 NOTE — Evaluation (Signed)
Occupational Therapy Evaluation Patient Details Name: Cindy [name] MRN: [id number] DOB: 1923-03-08 Today's Date: 11/19/2019    History of Present Illness 86 y/o female presented with abdominal pain. CT of abdomen revealed submassive PE. Pt started on Heprin drip.   Clinical Impression   Patient evaluated by Occupational Therapy with no further acute OT needs identified. All education has been completed and the patient has no further questions. Pt is performing ADLs and functional bathroom/bedroom mobility at baseline.  Pt completed 30 second Sit to stand test with score of 10 which is WNL for pt's age and gender, and SpO2 at 93% on RA after activity with recover to 97% and HR: 75 after activity and 67 at rest.   Pt will have all support needed by her caregiver, [name], and has a medical alert bracelet for when she is home alone. See below for any follow-up Occupational Therapy or equipment needs. OT is signing off. Thank you for this referral.     Follow Up Recommendations  No OT follow up    Equipment Recommendations       Recommendations for Other Services       Precautions / Restrictions Precautions Precautions: Fall Restrictions Weight Bearing Restrictions: No      Mobility Bed Mobility Overal bed mobility: Modified Independent             General bed mobility comments: Supine to sit: Mod I from partially elevated HOB with increased time.  Transfers Overall transfer level: Needs assistance Equipment used: None Transfers: Sit to/from Stand Sit to Stand: Supervision;Min guard         General transfer comment: Pt stood from EOB with supervision. Descended to standard toilet with CGA and verbal cues for use of grab bar.    Balance Overall balance assessment: Needs assistance Sitting-balance support: Single extremity supported Sitting balance-Leahy Scale: Fair Sitting balance - Comments: kyphotic   Standing balance support: Single extremity supported Standing  balance-Leahy Scale: Fair                 High Level Balance Comments: Pt performed 30 second Sit to Stand test, scoring 10 with SpO2 at 93% on RA after activity.  Recovered to 97% in ~59min of rest in chair.           ADL either performed or assessed with clinical judgement   ADL Overall ADL's : At baseline                                       General ADL Comments: Pt performing dressing, toileting and grooming at baseline.  Pt ambulating in room holding IV pole or reaching for furniture without LOB.  Pt ambulates at supervision level at home.     Vision Baseline Vision/History: Wears glasses Vision Assessment?: No apparent visual deficits     Perception     Praxis      Pertinent Vitals/Pain Pain Assessment: Faces Faces Pain Scale: Hurts little more Pain Location: LT knee after sit to stand test. Pain Intervention(s): Limited activity within patient's tolerance;Repositioned;Monitored during session     Hand Dominance Right   Extremity/Trunk Assessment Upper Extremity Assessment Upper Extremity Assessment: Generalized weakness (h/o RT rTSA ~08/2018 with limited shoulder ROM. Otherwise WFL for age)   Lower Extremity Assessment Lower Extremity Assessment: Defer to PT evaluation   Cervical / Trunk Assessment Cervical / Trunk Assessment: Kyphotic   Communication Communication Communication: No  difficulties   Cognition Arousal/Alertness: Awake/alert Behavior During Therapy: WFL for tasks assessed/performed Overall Cognitive Status: History of cognitive impairments - at baseline                                 General Comments: Pt oriented to person and place.  Disoriented to month, year and situation.  Pleasant and cooperative.   General Comments       Exercises     Shoulder Instructions      Home Living Family/patient expects to be discharged to:: Private residence Living Arrangements: Non-relatives/Friends Available Help  at Discharge: Personal care attendant;Available 24 hours/day (CG, Roger at home from early AM to when pt goes to bed. Pt stated that [name] can stay 24/7 if needed.) Type of Home: House Home Access: Ramped entrance     Home Layout: One level     Bathroom Shower/Tub: Occupational psychologist: Standard     Home Equipment: Bedside commode;Shower seat;Cane - single point;Walker - 2 wheels          Prior Functioning/Environment Level of Independence: Needs assistance  Gait / Transfers Assistance Needed: supervision for ambulation ADL's / Homemaking Assistance Needed: assist for bathing/dressing as needed by caregiver. [name] also completes all IADLs including housekeeping, laundry and cooking, and provides transportation.  Pt has a medical alert bracelet when she is home alone. Communication / Swallowing Assistance Needed: No deficits noted.          OT Problem List: Decreased activity tolerance;Pain      OT Treatment/Interventions:      OT Goals(Current goals can be found in the care plan section) Acute Rehab OT Goals Patient Stated Goal: Home today OT Goal Formulation: With patient Time For Goal Achievement: 11/19/19 Potential to Achieve Goals: Good ADL Goals Additional ADL Goal #1: Pt will demonstrate 2 ADLs and complete 30 second sit to stand test with SpO2 >92% on RA and HR within normal limits.  OT Frequency:     Barriers to D/C:            Co-evaluation              AM-PAC OT "6 Clicks" Daily Activity     Outcome Measure Help from another person eating meals?: None Help from another person taking care of personal grooming?: None Help from another person toileting, which includes using toliet, bedpan, or urinal?: A Little Help from another person bathing (including washing, rinsing, drying)?: A Little Help from another person to put on and taking off regular upper body clothing?: A Little Help from another person to put on and taking off regular  lower body clothing?: A Little 6 Click Score: 20   End of Session Equipment Utilized During Treatment: Gait belt Nurse Communication: Mobility status  Activity Tolerance: Patient tolerated treatment well Patient left: in chair;with chair alarm set;with call bell/phone within reach  OT Visit Diagnosis: Unsteadiness on feet (R26.81)                Time: 5809-9833 OT Time Calculation (min): 38 min Charges:  OT General Charges $OT Visit: 1 Visit OT Evaluation $OT Eval Low Complexity: 1 Low OT Treatments $Self Care/Home Management : 8-22 mins  Anderson Malta, OT Acute Rehab Services Office: [phone] 11/19/2019   Julien Girt 11/19/2019, 9:25 AM

## 2020-01-04 ENCOUNTER — Emergency Department (HOSPITAL_COMMUNITY): Payer: Medicare Other

## 2020-01-04 ENCOUNTER — Inpatient Hospital Stay (HOSPITAL_COMMUNITY)
Admission: EM | Admit: 2020-01-04 | Discharge: 2020-01-07 | DRG: 964 | Disposition: A | Discharge status: Home Health | Payer: Medicare Other | Attending: Internal Medicine | Admitting: Internal Medicine

## 2020-01-04 ENCOUNTER — Other Ambulatory Visit: Payer: Self-pay

## 2020-01-04 DIAGNOSIS — I2699 Other pulmonary embolism without acute cor pulmonale: Secondary | ICD-10-CM | POA: Diagnosis present

## 2020-01-04 DIAGNOSIS — Y92009 Unspecified place in unspecified non-institutional (private) residence as the place of occurrence of the external cause: Secondary | ICD-10-CM

## 2020-01-04 DIAGNOSIS — F0390 Unspecified dementia without behavioral disturbance: Secondary | ICD-10-CM | POA: Diagnosis present

## 2020-01-04 DIAGNOSIS — N2889 Other specified disorders of kidney and ureter: Secondary | ICD-10-CM | POA: Diagnosis not present

## 2020-01-04 DIAGNOSIS — Z86711 Personal history of pulmonary embolism: Secondary | ICD-10-CM

## 2020-01-04 DIAGNOSIS — W19XXXA Unspecified fall, initial encounter: Secondary | ICD-10-CM

## 2020-01-04 DIAGNOSIS — N184 Chronic kidney disease, stage 4 (severe): Secondary | ICD-10-CM | POA: Diagnosis present

## 2020-01-04 DIAGNOSIS — I447 Left bundle-branch block, unspecified: Secondary | ICD-10-CM | POA: Diagnosis present

## 2020-01-04 DIAGNOSIS — Z20822 Contact with and (suspected) exposure to covid-19: Secondary | ICD-10-CM | POA: Diagnosis present

## 2020-01-04 DIAGNOSIS — I129 Hypertensive chronic kidney disease with stage 1 through stage 4 chronic kidney disease, or unspecified chronic kidney disease: Secondary | ICD-10-CM | POA: Diagnosis present

## 2020-01-04 DIAGNOSIS — Z96612 Presence of left artificial shoulder joint: Secondary | ICD-10-CM | POA: Diagnosis present

## 2020-01-04 DIAGNOSIS — S2242XA Multiple fractures of ribs, left side, initial encounter for closed fracture: Secondary | ICD-10-CM | POA: Diagnosis present

## 2020-01-04 DIAGNOSIS — I951 Orthostatic hypotension: Secondary | ICD-10-CM | POA: Diagnosis not present

## 2020-01-04 DIAGNOSIS — H9190 Unspecified hearing loss, unspecified ear: Secondary | ICD-10-CM | POA: Diagnosis present

## 2020-01-04 DIAGNOSIS — H3530 Unspecified macular degeneration: Secondary | ICD-10-CM | POA: Diagnosis present

## 2020-01-04 DIAGNOSIS — M810 Age-related osteoporosis without current pathological fracture: Secondary | ICD-10-CM | POA: Diagnosis present

## 2020-01-04 DIAGNOSIS — Z85820 Personal history of malignant melanoma of skin: Secondary | ICD-10-CM | POA: Diagnosis not present

## 2020-01-04 DIAGNOSIS — S20219A Contusion of unspecified front wall of thorax, initial encounter: Secondary | ICD-10-CM | POA: Diagnosis present

## 2020-01-04 DIAGNOSIS — Z66 Do not resuscitate: Secondary | ICD-10-CM | POA: Diagnosis present

## 2020-01-04 DIAGNOSIS — M1990 Unspecified osteoarthritis, unspecified site: Secondary | ICD-10-CM | POA: Diagnosis present

## 2020-01-04 DIAGNOSIS — E039 Hypothyroidism, unspecified: Secondary | ICD-10-CM | POA: Diagnosis present

## 2020-01-04 DIAGNOSIS — S271XXA Traumatic hemothorax, initial encounter: Secondary | ICD-10-CM | POA: Diagnosis present

## 2020-01-04 DIAGNOSIS — Z882 Allergy status to sulfonamides status: Secondary | ICD-10-CM

## 2020-01-04 DIAGNOSIS — Z7901 Long term (current) use of anticoagulants: Secondary | ICD-10-CM

## 2020-01-04 DIAGNOSIS — W01190A Fall on same level from slipping, tripping and stumbling with subsequent striking against furniture, initial encounter: Secondary | ICD-10-CM | POA: Diagnosis present

## 2020-01-04 DIAGNOSIS — S37012A Minor contusion of left kidney, initial encounter: Secondary | ICD-10-CM

## 2020-01-04 DIAGNOSIS — J942 Hemothorax: Secondary | ICD-10-CM

## 2020-01-04 DIAGNOSIS — I160 Hypertensive urgency: Secondary | ICD-10-CM | POA: Diagnosis not present

## 2020-01-04 DIAGNOSIS — I252 Old myocardial infarction: Secondary | ICD-10-CM | POA: Diagnosis not present

## 2020-01-04 DIAGNOSIS — Z79899 Other long term (current) drug therapy: Secondary | ICD-10-CM

## 2020-01-04 DIAGNOSIS — Z79890 Hormone replacement therapy (postmenopausal): Secondary | ICD-10-CM

## 2020-01-04 DIAGNOSIS — E785 Hyperlipidemia, unspecified: Secondary | ICD-10-CM | POA: Diagnosis present

## 2020-01-04 DIAGNOSIS — S2249XA Multiple fractures of ribs, unspecified side, initial encounter for closed fracture: Secondary | ICD-10-CM | POA: Diagnosis present

## 2020-01-04 DIAGNOSIS — I10 Essential (primary) hypertension: Secondary | ICD-10-CM | POA: Diagnosis present

## 2020-01-04 DIAGNOSIS — Z91013 Allergy to seafood: Secondary | ICD-10-CM

## 2020-01-04 DIAGNOSIS — K219 Gastro-esophageal reflux disease without esophagitis: Secondary | ICD-10-CM | POA: Diagnosis present

## 2020-01-04 DIAGNOSIS — Z886 Allergy status to analgesic agent status: Secondary | ICD-10-CM

## 2020-01-04 DIAGNOSIS — Y92013 Bedroom of single-family (private) house as the place of occurrence of the external cause: Secondary | ICD-10-CM

## 2020-01-04 DIAGNOSIS — Z808 Family history of malignant neoplasm of other organs or systems: Secondary | ICD-10-CM

## 2020-01-04 LAB — RESPIRATORY PANEL BY RT PCR (FLU A&B, COVID)
Influenza A by PCR: NEGATIVE
Influenza B by PCR: NEGATIVE
SARS Coronavirus 2 by RT PCR: NEGATIVE

## 2020-01-04 LAB — BASIC METABOLIC PANEL
Anion gap: 9 (ref 5–15)
BUN: 27 mg/dL — ABNORMAL HIGH (ref 8–23)
CO2: 25 mmol/L (ref 22–32)
Calcium: 9.2 mg/dL (ref 8.9–10.3)
Chloride: 105 mmol/L (ref 98–111)
Creatinine, Ser: 1.36 mg/dL — ABNORMAL HIGH (ref 0.44–1.00)
GFR calc Af Amer: 38 mL/min — ABNORMAL LOW (ref >60)
GFR calc non Af Amer: 33 mL/min — ABNORMAL LOW (ref >60)
Glucose, Bld: 97 mg/dL (ref 70–99)
Potassium: 4.3 mmol/L (ref 3.5–5.1)
Sodium: 139 mmol/L (ref 135–145)

## 2020-01-04 LAB — CBC WITH DIFFERENTIAL/PLATELET
Abs Immature Granulocytes: 0.03 10*3/uL (ref 0.00–0.07)
Basophils Absolute: 0.1 10*3/uL (ref 0.0–0.1)
Basophils Relative: 1 %
Eosinophils Absolute: 0.3 10*3/uL (ref 0.0–0.5)
Eosinophils Relative: 3 %
HCT: 35.1 % — ABNORMAL LOW (ref 36.0–46.0)
Hemoglobin: 11.7 g/dL — ABNORMAL LOW (ref 12.0–15.0)
Immature Granulocytes: 0 %
Lymphocytes Relative: 18 %
Lymphs Abs: 1.8 10*3/uL (ref 0.7–4.0)
MCH: 33.2 pg (ref 26.0–34.0)
MCHC: 33.3 g/dL (ref 30.0–36.0)
MCV: 99.7 fL (ref 80.0–100.0)
Monocytes Absolute: 1.3 10*3/uL — ABNORMAL HIGH (ref 0.1–1.0)
Monocytes Relative: 12 %
Neutro Abs: 6.6 10*3/uL (ref 1.7–7.7)
Neutrophils Relative %: 66 %
Platelets: 304 10*3/uL (ref 150–400)
RBC: 3.52 MIL/uL — ABNORMAL LOW (ref 3.87–5.11)
RDW: 14.1 % (ref 11.5–15.5)
WBC: 10.2 10*3/uL (ref 4.0–10.5)
nRBC: 0 % (ref 0.0–0.2)

## 2020-01-04 MED ORDER — ALBUTEROL SULFATE (2.5 MG/3ML) 0.083% IN NEBU: 2.5000 mg | INHALATION_SOLUTION | RESPIRATORY_TRACT | Status: DC | PRN

## 2020-01-04 MED ORDER — BISACODYL 10 MG RE SUPP: 10.0000 mg | Freq: Every day | RECTAL | Status: DC | PRN

## 2020-01-04 MED ORDER — HYPROMELLOSE (GONIOSCOPIC) 2.5 % OP SOLN
1.0000 [drp] | Freq: Every day | OPHTHALMIC | Status: DC
  Administered 2020-01-06 – 2020-01-07 (×2): 1 [drp] via OPHTHALMIC
  Filled 2020-01-04: qty 15

## 2020-01-04 MED ORDER — TRAMADOL HCL 50 MG PO TABS
50.0000 mg | ORAL_TABLET | Freq: Two times a day (BID) | ORAL | Status: DC | PRN
  Administered 2020-01-06 – 2020-01-07 (×2): 50 mg via ORAL
  Filled 2020-01-04 (×2): qty 1

## 2020-01-04 MED ORDER — ONDANSETRON HCL 4 MG PO TABS: 4.0000 mg | ORAL_TABLET | Freq: Four times a day (QID) | ORAL | Status: DC | PRN

## 2020-01-04 MED ORDER — POLYETHYLENE GLYCOL 3350 17 G PO PACK
17.0000 g | PACK | Freq: Every day | ORAL | Status: DC
  Administered 2020-01-05 – 2020-01-07 (×3): 17 g via ORAL
  Filled 2020-01-04 (×3): qty 1

## 2020-01-04 MED ORDER — PANTOPRAZOLE SODIUM 40 MG PO TBEC
40.0000 mg | DELAYED_RELEASE_TABLET | Freq: Every day | ORAL | Status: DC
  Administered 2020-01-05 – 2020-01-07 (×3): 40 mg via ORAL
  Filled 2020-01-04 (×3): qty 1

## 2020-01-04 MED ORDER — AMLODIPINE BESYLATE 5 MG PO TABS
5.0000 mg | ORAL_TABLET | Freq: Every day | ORAL | Status: DC
  Administered 2020-01-04: 5 mg via ORAL
  Filled 2020-01-04: qty 1

## 2020-01-04 MED ORDER — ONDANSETRON HCL 4 MG/2ML IJ SOLN: 4.0000 mg | Freq: Four times a day (QID) | INTRAMUSCULAR | Status: DC | PRN

## 2020-01-04 MED ORDER — MIRABEGRON ER 25 MG PO TB24
50.0000 mg | ORAL_TABLET | Freq: Every day | ORAL | Status: DC
  Administered 2020-01-05 – 2020-01-07 (×3): 50 mg via ORAL
  Filled 2020-01-04 (×3): qty 2

## 2020-01-04 MED ORDER — SODIUM CHLORIDE 0.9 % IV BOLUS
1000.0000 mL | Freq: Once | INTRAVENOUS | Status: AC
  Administered 2020-01-04: 1000 mL via INTRAVENOUS

## 2020-01-04 MED ORDER — PROTHROMBIN COMPLEX CONC HUMAN 500 UNITS IV KIT: 50.0000 [IU]/kg | PACK | Status: DC

## 2020-01-04 MED ORDER — HYDROCHLOROTHIAZIDE 12.5 MG PO CAPS
12.5000 mg | ORAL_CAPSULE | Freq: Every day | ORAL | Status: DC
  Administered 2020-01-04 – 2020-01-05 (×2): 12.5 mg via ORAL
  Filled 2020-01-04 (×2): qty 1

## 2020-01-04 MED ORDER — FENTANYL CITRATE (PF) 100 MCG/2ML IJ SOLN: 12.5000 ug | INTRAMUSCULAR | Status: DC | PRN

## 2020-01-04 MED ORDER — IOHEXOL 300 MG/ML  SOLN
75.0000 mL | Freq: Once | INTRAMUSCULAR | Status: AC | PRN
  Administered 2020-01-04: 75 mL via INTRAVENOUS

## 2020-01-04 MED ORDER — ACETAMINOPHEN 650 MG RE SUPP: 650.0000 mg | Freq: Four times a day (QID) | RECTAL | Status: DC | PRN

## 2020-01-04 MED ORDER — PROTHROMBIN COMPLEX CONC HUMAN 500 UNITS IV KIT
2161.0000 [IU] | PACK | Status: AC
  Administered 2020-01-04: 2161 [IU] via INTRAVENOUS
  Filled 2020-01-04: qty 2161

## 2020-01-04 MED ORDER — POLYETHYLENE GLYCOL 3350 17 G PO PACK: 17.0000 g | PACK | Freq: Every day | ORAL | Status: DC | PRN

## 2020-01-04 MED ORDER — TRIAMCINOLONE ACETONIDE 0.025 % EX CREA
1.0000 "application " | TOPICAL_CREAM | CUTANEOUS | Status: DC
  Administered 2020-01-07: 1 via TOPICAL
  Filled 2020-01-04: qty 15

## 2020-01-04 MED ORDER — ACETAMINOPHEN 325 MG PO TABS: 650.0000 mg | ORAL_TABLET | Freq: Four times a day (QID) | ORAL | Status: DC | PRN

## 2020-01-04 MED ORDER — FENTANYL CITRATE (PF) 100 MCG/2ML IJ SOLN
50.0000 ug | Freq: Once | INTRAMUSCULAR | Status: AC
  Administered 2020-01-04: 50 ug via INTRAVENOUS
  Filled 2020-01-04: qty 2

## 2020-01-04 MED ORDER — LEVOTHYROXINE SODIUM 75 MCG PO TABS
75.0000 ug | ORAL_TABLET | Freq: Every day | ORAL | Status: DC
  Administered 2020-01-05 – 2020-01-07 (×3): 75 ug via ORAL
  Filled 2020-01-04 (×3): qty 1

## 2020-01-04 MED ORDER — HYDRALAZINE HCL 20 MG/ML IJ SOLN: 10.0000 mg | INTRAMUSCULAR | Status: DC | PRN

## 2020-01-04 MED ORDER — PROTHROMBIN COMPLEX CONC HUMAN 500 UNITS IV KIT
2161.0000 [IU] | PACK | Status: DC
  Filled 2020-01-04: qty 2161

## 2020-01-04 MED ORDER — MORPHINE SULFATE (PF) 4 MG/ML IV SOLN
4.0000 mg | Freq: Once | INTRAVENOUS | Status: AC
  Administered 2020-01-04: 4 mg via INTRAVENOUS
  Filled 2020-01-04: qty 1

## 2020-01-04 MED ORDER — HYDROCODONE-ACETAMINOPHEN 5-325 MG PO TABS: 1.0000 | ORAL_TABLET | ORAL | Status: DC | PRN

## 2020-01-04 NOTE — ED Provider Notes (Signed)
[facility] DEPT Provider Note   CSN: [id number] Arrival date & time: 01/04/20  1338     History Chief Complaint  Patient presents with  . Fall    Cindy [name] is a 86 y.o. female with a past medical history of arthritis, hypertension, recent admission for PE (acute bilateral PEs with saddle embolus on the right with evidence of right heart strain seen on CT scan on 11/16/2019) currently on Eliquis, presenting to the ED with a chief complaint of left-sided rib pain after fall that occurred last night.  History is provided by son at the bedside who lives with the patient.  States that she lost her balance while walking to the bathroom which is typical for her from time to time due to her age.  She hit the left side of her chest on the bed frame.  Woke up this morning with continued pain to the lower rib area and now having pain under her L breast.  She was concerned because she had similar pain under her left breast last month when she was diagnosed with her PE.  She has been compliant with her Eliquis.  Denies any head injury, loss of consciousness.  She ambulates with assistance at baseline.  Denies any headache, blurry vision, numbness in arms or legs, shortness of breath, leg swelling.  HPI     Past Medical History:  Diagnosis Date  . Arthritis   . Chronic renal insufficiency, stage III (moderate)   . Depression   . Dyslipidemia   . Forgetfulness   . GERD (gastroesophageal reflux disease)    takes Omeprazole for Hital Hernia   . Hiatal hernia   . History of nuclear stress test 2005   persantine; inferoapical ischmeia w/ EF 83%; no CAD at cath  . HOH (hard of hearing)    hearing aids  . Hypertension   . Hypothyroidism   . LBBB (left bundle branch block)    chronic per Dr. Gwenlyn Found  . Lung nodule 07/01/03   right middle lobe  . Macular degeneration   . Malignant melanoma ([place]) 11/12/04   L upper arm, MOHS_ right hand, leg  . Murmur, cardiac   .  Osteoporosis   . Pneumonia 06/2018    Patient Active Problem List   Diagnosis Date Noted  . Pulmonary emboli ([place]) 11/17/2019  . Palliative care by specialist   . Goals of care, counseling/discussion   . General weakness   . Pulmonary embolism ([place]) 11/16/2019  . Dementia ([place]) 11/16/2019  . Hypothyroidism 11/16/2019  . Pancreatic mass 11/16/2019  . NSTEMI (non-ST elevated myocardial infarction) ([place]) 03/05/2019  . Pain due to onychomycosis of toenails of both feet 10/03/2018  . H/O total shoulder replacement, left 08/04/2018  . Pneumonia 06/13/2018  . Pressure injury of skin 06/13/2018  . Abdominal pain 11/08/2016  . Acute lower UTI 11/08/2016  . Acute cystitis without hematuria   . SBO (small bowel obstruction) ([place]) 06/28/2016  . Constipation, chronic 06/28/2016  . Acute distention of stomach 06/28/2016  . Eventration of left crus of diaphragm 06/27/2016  . Uncontrolled hypertension 06/27/2016  . Gastric outlet obstruction 06/27/2016  . CRI (chronic renal insufficiency), stage 4 (severe) ([place]) 06/14/2016  . Primary hypothyroidism 06/14/2016  . Osteoporosis, postmenopausal 06/14/2016  . OAB (overactive bladder) 02/25/2016  . Left bundle branch block 12/14/2012  . Essential hypertension 12/14/2012  . Hyperlipidemia 12/14/2012  . Pain in shoulder 11/11/2011  . Melanoma of left upper arm ([place]) 10/19/2011  . Lung  mass 06/16/2011  . Acquired deformities of toe 06/14/2011  . Personal history of other diseases of the digestive system 06/07/2011  . GERD (gastroesophageal reflux disease) 06/07/2011    Past Surgical History:  Procedure Laterality Date  . BLADDER REPAIR    . CARDIAC CATHETERIZATION  2005/2007   normal coronaries   . CATARACT EXTRACTION     bilat  . coccyx  1951   excision  . COLONOSCOPY    . knee     right cartilage repair  . REVERSE SHOULDER ARTHROPLASTY Left 08/04/2018   Procedure: REVERSE LEFT SHOULDER ARTHROPLASTY;  Surgeon: Netta Cedars, MD;   Location: [facility];  Service: Orthopedics;  Laterality: Left;  . ROTATOR CUFF REPAIR     right  . TOTAL ABDOMINAL HYSTERECTOMY W/ BILATERAL SALPINGOOPHORECTOMY    . TRANSTHORACIC ECHOCARDIOGRAM  2007   EF=>55%; borderline conc LVH; LA mod dilated; RA mild-mod dilated; mild TR; mild AVR with mildly sclerotic AV leavlefts; mild PV regurg      OB History   No obstetric history on file.     Family History  Problem Relation Age of Onset  . Cancer Father        NHL  . Cancer Maternal Grandmother        rectal  . Cancer Daughter        breast  . Cancer Maternal Aunt        breast  . Cancer Son        melanoma    Social History   Tobacco Use  . Smoking status: Never Smoker  . Smokeless tobacco: Never Used  Vaping Use  . Vaping Use: Never used  Substance Use Topics  . Alcohol use: No  . Drug use: No    Home Medications Prior to Admission medications   Medication Sig Start Date End Date Taking? Authorizing Provider  acetaminophen (TYLENOL) 325 MG tablet Take 1-2 tablets (325-650 mg total) by mouth every 6 (six) hours as needed for mild pain or moderate pain (pain score 1-3 or temp > 100.5). Patient not taking: Reported on 11/16/2019 08/07/18   Netta Cedars, MD  acetaminophen (TYLENOL) 500 MG tablet Take 500 mg by mouth in the morning, at noon, and at bedtime.    [provider]  apixaban (ELIQUIS) 5 MG TABS tablet Take 2 tablets (10mg ) twice daily for 7 days, then 1 tablet (5mg ) twice daily 11/19/19   Shahmehdi, Valeria Batman, MD  APIXABAN Arne [place]) VTE STARTER PACK (10MG  AND 5MG ) Take as directed on package: start with two-5mg  tablets twice daily for 7 days. On day 8, switch to one-5mg  tablet twice daily. 11/19/19   Shahmehdi, Valeria Batman, MD  ciprofloxacin (CIPRO) 250 MG tablet Take 250 mg by mouth as directed. Take PRN for Kidney Infection    [provider]  clobetasol (TEMOVATE) 0.05 % external solution Apply 1 application topically 2 (two) times a week.  04/21/18   [provider]  CRANBERRY PO Take 1 tablet by mouth daily.     [provider]  fluocinolone (SYNALAR) 0.025 % cream Apply 1 application topically 2 (two) times daily.    [provider]  glucosamine-chondroitin 500-400 MG tablet Take 1 tablet by mouth daily.    [provider]  hydrocortisone 2.5 % cream Apply 1 application topically 2 (two) times daily as needed (dry skin).  02/23/18   [provider]  hydroxypropyl methylcellulose / hypromellose (ISOPTO TEARS / GONIOVISC) 2.5 % ophthalmic solution Place 1 drop into both  eyes daily.    [provider]  ipratropium (ATROVENT) 0.06 % nasal spray Place 1-2 sprays into both nostrils every 6 (six) hours as needed (rhinorrhea).    [provider]  ketoconazole (NIZORAL) 2 % shampoo Apply 1 application topically 3 (three) times a week.  03/29/18   [provider]  levothyroxine (SYNTHROID) 75 MCG tablet Take 75 mcg by mouth daily before breakfast.  07/01/16   [provider]  mirabegron ER (MYRBETRIQ) 50 MG TB24 tablet Take 50 mg by mouth daily.  03/01/17   [provider]  Multiple Vitamins-Minerals (OCUVITE EXTRA) TABS Take 1 tablet by mouth daily.    [provider]  nitrofurantoin (MACRODANTIN) 100 MG capsule Take 100 mg by mouth as directed. Take PRN for Kidney Infection    [provider]  Omega-3 1000 MG CAPS Take 1,000 mg by mouth 2 (two) times a day.    [provider]  omega-3 acid ethyl esters (LOVAZA) 1 g capsule Take 1 g by mouth 2 (two) times daily.    [provider]  omeprazole (PRILOSEC) 20 MG capsule Take 20 mg by mouth daily.    [provider]  polyethylene glycol (MIRALAX) 17 g packet Take 17 g by mouth daily. 03/07/19   Shelly Coss, MD  simvastatin (ZOCOR) 20 MG tablet Take 1 tablet (20 mg total) by mouth every evening. 11/19/19   Shahmehdi, Valeria Batman, MD  solifenacin (VESICARE) 10 MG tablet Take 10 mg by  mouth daily. 01/26/19   [provider]  traMADol (ULTRAM) 50 MG tablet Take 50 mg by mouth every 8 (eight) hours as needed for moderate pain or severe pain.  09/12/18   [provider]  Triamcinolone Acetonide 0.025 % LOTN Apply 1 application topically 2 (two) times daily as needed (scalp).  08/24/19   [provider]  triamcinolone cream (KENALOG) 0.1 % Apply 1 application topically 2 (two) times daily.    [provider]    Allergies    Fish allergy, Diclofenac, and Sulfa antibiotics  Review of Systems   Review of Systems  Constitutional: Negative for appetite change, chills and fever.  HENT: Negative for ear pain, rhinorrhea, sneezing and sore throat.   Eyes: Negative for photophobia and visual disturbance.  Respiratory: Negative for cough, chest tightness, shortness of breath and wheezing.   Cardiovascular: Negative for chest pain and palpitations.  Gastrointestinal: Negative for abdominal pain, blood in stool, constipation, diarrhea, nausea and vomiting.  Genitourinary: Negative for dysuria, hematuria and urgency.  Musculoskeletal: Positive for arthralgias and myalgias.  Skin: Negative for rash.  Neurological: Negative for dizziness, weakness and light-headedness.    Physical Exam Updated Vital Signs BP (!) 167/77 (BP Location: Right Arm)   Pulse 60   Temp 98.8 F (37.1 C) (Oral)   Resp 14   SpO2 93%   Physical Exam Vitals and nursing note reviewed.  Constitutional:      General: She is not in acute distress.    Appearance: She is well-developed.  HENT:     Head: Normocephalic and atraumatic.     Nose: Nose normal.  Eyes:     General: No scleral icterus.       Right eye: No discharge.        Left eye: No discharge.     Conjunctiva/sclera: Conjunctivae normal.     Pupils: Pupils are equal, round, and reactive to light.  Cardiovascular:     Rate and Rhythm: Normal rate and regular rhythm.  Heart sounds: Normal heart sounds. No  murmur heard.  No friction rub. No gallop.   Pulmonary:     Effort: Pulmonary effort is normal. No respiratory distress.     Breath sounds: Normal breath sounds.  Chest:     Chest wall: Tenderness present.    Abdominal:     General: Bowel sounds are normal. There is no distension.     Palpations: Abdomen is soft.     Tenderness: There is no abdominal tenderness. There is no guarding.  Musculoskeletal:        General: Normal range of motion.     Cervical back: Normal range of motion and neck supple.  Skin:    General: Skin is warm and dry.     Findings: No rash.     Comments: No ecchymosis noted.  Neurological:     General: No focal deficit present.     Mental Status: She is alert and oriented to person, place, and time.     Cranial Nerves: No cranial nerve deficit.     Sensory: No sensory deficit.     Motor: No weakness or abnormal muscle tone.     Coordination: Coordination normal.     ED Results / Procedures / Treatments   Labs (all labs ordered are listed, but only abnormal results are displayed) Labs Reviewed  BASIC METABOLIC PANEL - Abnormal; Notable for the following components:      Result Value   BUN 27 (*)    Creatinine, Ser 1.36 (*)    GFR calc non Af Amer 33 (*)    GFR calc Af Amer 38 (*)    All other components within normal limits  CBC WITH DIFFERENTIAL/PLATELET - Abnormal; Notable for the following components:   RBC 3.52 (*)    Hemoglobin 11.7 (*)    HCT 35.1 (*)    Monocytes Absolute 1.3 (*)    All other components within normal limits    EKG EKG Interpretation  Date/Time:  Friday January 04 2020 14:41:45 EDT Ventricular Rate:  58 PR Interval:    QRS Duration: 146 QT Interval:  488 QTC Calculation: 480 R Axis:   -55 Text Interpretation: Sinus or ectopic atrial rhythm Prolonged PR interval Left bundle branch block No significant change since last tracing Confirmed by Wandra Arthurs [phone]) on 01/04/2020 3:48:04 PM   Radiology DG Ribs  Unilateral W/Chest Left  Result Date: 01/04/2020 CLINICAL DATA:  Fall.  Left rib pain. EXAM: LEFT RIBS AND CHEST - 3+ VIEW COMPARISON:  03/05/2019 FINDINGS: Elevation of the left hemidiaphragm, stable. Left base atelectasis. No effusion or pneumothorax. Heart is normal size. No visible displaced rib fracture. No visible displaced rib fracture. Prior left shoulder replacement. Chronic elevation of the left hemidiaphragm.  Left base atelectasis. IMPRESSION: Negative. Electronically Signed   By: Rolm Baptise M.D.   On: 01/04/2020 16:08    Procedures Procedures (including critical care time)  Medications Ordered in ED Medications  iohexol (OMNIPAQUE) 300 MG/ML solution 75 mL (has no administration in time range)  sodium chloride 0.9 % bolus 1,000 mL (1,000 mLs Intravenous New Bag/Given (Non-Interop) 01/04/20 1704)  fentaNYL (SUBLIMAZE) injection 50 mcg (50 mcg Intravenous Given 01/04/20 1658)    ED Course  I have reviewed the triage vital signs and the nursing notes.  Pertinent labs & imaging results that were available during my care of the patient were reviewed by me and considered in my medical decision making (see chart for details).    MDM Rules/Calculators/A&P  86 year old female with a past medical history of arthritis, hypertension, recent admission for bilateral PEs with saddle embolus currently on Eliquis presenting to the ED with a chief complaint of left-sided rib pain after fall that occurred last night.  Patient was ambulating with the assistance of her son yesterday walking to the bathroom.  She states that she lost her balance which is typical for her.  She hit the left lower chest wall on part of the bed.  Denies any head injury, loss of consciousness or fall to the ground.  Has been having pain in the area since then but woke up this morning with pain under her left breast.  Son was concerned as she had similar pain last month when she was ultimately  diagnosed with PEs.  Denies any shortness of breath, headache, vision changes, vomiting, numbness in arms or legs.  Son at bedside states that she is at her mental baseline.  Patient with no neurological deficits noted.  She does have some tenderness of the left lower rib/chest wall without any overlying skin changes.  Oxygen saturations are 98% on room air.  She does not appear in respiratory distress.  BMP with creatinine of 1.3 which is slightly elevated from her baseline, given IV fluids for this.  CBC is unremarkable.  EKG shows sinus rhythm, no changes from prior tracings. CXR shows chronic elevation of left hemidiaphragm and left atelectasis. Will obtain CT of the chest, abdomen and pelvis with contrast to evaluate for further injury. Patient discussed with and seen by the attending, Dr. Darl Householder. He will follow up on remainder of workup to determine final disposition.  All imaging, if done today, including plain films, CT scans, and ultrasounds, independently reviewed by me, and interpretations confirmed via formal radiology reads.  Portions of this note were generated with [employer]. Dictation errors may occur despite best attempts at proofreading.  Final Clinical Impression(s) / ED Diagnoses Final diagnoses:  Fall in home, initial encounter    Rx / DC Orders ED Discharge Orders    None       Delia Heady, PA-C 01/04/20 1734    Drenda Freeze, MD 01/04/20 2251

## 2020-01-04 NOTE — Consult Note (Signed)
CC: rib pain  Requesting provider: Dr Benny Lennert  HPI: Cindy [name] is an 86 y.o. female who was here recently for PE and saddle emboli, currently on Eliquis.  She presented to the ED with a chief complaint of left-sided rib pain after fall that occurred last night.  She states she stumbled coming out of her bathroom and landed against the side of her bed on her left side.  Woke up this morning with continued pain to the lower rib area.  Denies hitting her head, denies neck pain, denies abd pain.    Past Medical History:  Diagnosis Date  . Arthritis   . Chronic renal insufficiency, stage III (moderate)   . Depression   . Dyslipidemia   . Forgetfulness   . GERD (gastroesophageal reflux disease)    takes Omeprazole for Hital Hernia   . Hiatal hernia   . History of nuclear stress test 2005   persantine; inferoapical ischmeia w/ EF 83%; no CAD at cath  . HOH (hard of hearing)    hearing aids  . Hypertension   . Hypothyroidism   . LBBB (left bundle branch block)    chronic per Dr. Gwenlyn Found  . Lung nodule 07/01/03   right middle lobe  . Macular degeneration   . Malignant melanoma ([place]) 11/12/04   L upper arm, MOHS_ right hand, leg  . Murmur, cardiac   . Osteoporosis   . Pneumonia 06/2018    Past Surgical History:  Procedure Laterality Date  . BLADDER REPAIR    . CARDIAC CATHETERIZATION  2005/2007   normal coronaries   . CATARACT EXTRACTION     bilat  . coccyx  1951   excision  . COLONOSCOPY    . knee     right cartilage repair  . REVERSE SHOULDER ARTHROPLASTY Left 08/04/2018   Procedure: REVERSE LEFT SHOULDER ARTHROPLASTY;  Surgeon: Netta Cedars, MD;  Location: [facility];  Service: Orthopedics;  Laterality: Left;  . ROTATOR CUFF REPAIR     right  . TOTAL ABDOMINAL HYSTERECTOMY W/ BILATERAL SALPINGOOPHORECTOMY    . TRANSTHORACIC ECHOCARDIOGRAM  2007   EF=>55%; borderline conc LVH; LA mod dilated; RA mild-mod dilated; mild TR; mild AVR with mildly sclerotic AV leavlefts; mild PV  regurg     Family History  Problem Relation Age of Onset  . Cancer Father        NHL  . Cancer Maternal Grandmother        rectal  . Cancer Daughter        breast  . Cancer Maternal Aunt        breast  . Cancer Son        melanoma    Social:  reports that she has never smoked. She has never used smokeless tobacco. She reports that she does not drink alcohol and does not use drugs.  Allergies:  Allergies  Allergen Reactions  . Fish Allergy Hives  . Diclofenac Other (See Comments)    unknown  . Ibuprofen Other (See Comments)    Affects kidney function  . Sulfa Antibiotics     Unknown reaction    Medications: I have reviewed the patient's current medications.  Results for orders placed or performed during the hospital encounter of 01/04/20 (from the past 48 hour(s))  Basic metabolic panel     Status: Abnormal   Collection Time: 01/04/20  3:06 PM  Result Value Ref Range   Sodium 139 135 - 145 mmol/L   Potassium 4.3 3.5 - 5.1  mmol/L   Chloride 105 98 - 111 mmol/L   CO2 25 22 - 32 mmol/L   Glucose, Bld 97 70 - 99 mg/dL    Comment: Glucose reference range applies only to samples taken after fasting for at least 8 hours.   BUN 27 (H) 8 - 23 mg/dL   Creatinine, Ser 1.36 (H) 0.44 - 1.00 mg/dL   Calcium 9.2 8.9 - 10.3 mg/dL   GFR calc non Af Amer 33 (L) >60 mL/min   GFR calc Af Amer 38 (L) >60 mL/min   Anion gap 9 5 - 15    Comment: Performed at [facility], [place] [address], [place], [place] [address]  CBC with Differential     Status: Abnormal   Collection Time: 01/04/20  3:06 PM  Result Value Ref Range   WBC 10.2 4.0 - 10.5 K/uL   RBC 3.52 (L) 3.87 - 5.11 MIL/uL   Hemoglobin 11.7 (L) 12.0 - 15.0 g/dL   HCT 35.1 (L) 36 - 46 %   MCV 99.7 80.0 - 100.0 fL   MCH 33.2 26.0 - 34.0 pg   MCHC 33.3 30.0 - 36.0 g/dL   RDW 14.1 11.5 - 15.5 %   Platelets 304 150 - 400 K/uL   nRBC 0.0 0.0 - 0.2 %   Neutrophils Relative % 66 %   Neutro Abs 6.6 1.7 - 7.7 K/uL    Lymphocytes Relative 18 %   Lymphs Abs 1.8 0.7 - 4.0 K/uL   Monocytes Relative 12 %   Monocytes Absolute 1.3 (H) 0 - 1 K/uL   Eosinophils Relative 3 %   Eosinophils Absolute 0.3 0 - 0 K/uL   Basophils Relative 1 %   Basophils Absolute 0.1 0 - 0 K/uL   Immature Granulocytes 0 %   Abs Immature Granulocytes 0.03 0.00 - 0.07 K/uL    Comment: Performed at [facility], [place] [address], [place], [place] [address]    DG Ribs Unilateral W/Chest Left  Result Date: 01/04/2020 CLINICAL DATA:  Fall.  Left rib pain. EXAM: LEFT RIBS AND CHEST - 3+ VIEW COMPARISON:  03/05/2019 FINDINGS: Elevation of the left hemidiaphragm, stable. Left base atelectasis. No effusion or pneumothorax. Heart is normal size. No visible displaced rib fracture. No visible displaced rib fracture. Prior left shoulder replacement. Chronic elevation of the left hemidiaphragm.  Left base atelectasis. IMPRESSION: Negative. Electronically Signed   By: Rolm Baptise M.D.   On: 01/04/2020 16:08    ROS - all of the below systems have been reviewed with the patient and positives are indicated with bold text General: chills, fever or night sweats Eyes: blurry vision or double vision ENT: epistaxis or sore throat Allergy/Immunology: itchy/watery eyes or nasal congestion Hematologic/Lymphatic: bleeding problems, blood clots or swollen lymph nodes Endocrine: temperature intolerance or unexpected weight changes Breast: new or changing breast lumps or nipple discharge Resp: cough, shortness of breath, or wheezing CV: chest pain or dyspnea on exertion GI: as per HPI GU: dysuria, trouble voiding, or hematuria MSK: joint pain or joint stiffness Neuro: TIA or stroke symptoms Derm: pruritus and skin lesion changes Psych: anxiety and depression  PE Blood pressure (!) 210/92, pulse 71, temperature 98.8 F (37.1 C), temperature source Oral, resp. rate (!) 21, height 5' (1.524 m), weight 47.4 kg, SpO2 95 %. Constitutional:  NAD; conversant; no deformities Eyes: Moist conjunctiva; no lid lag; anicteric; PERRL Neck: Trachea midline; no thyromegaly Lungs: Normal respiratory effort; no tactile fremitus CV: RRR; no palpable  thrills; no pitting edema GI: Abd soft, nontended; no palpable hepatosplenomegaly MSK: Normal range of motion of extremities; no clubbing/cyanosis Psychiatric: Appropriate affect; alert and oriented x3 Lymphatic: No palpable cervical or axillary lymphadenopathy  Results for orders placed or performed during the hospital encounter of 01/04/20 (from the past 48 hour(s))  Basic metabolic panel     Status: Abnormal   Collection Time: 01/04/20  3:06 PM  Result Value Ref Range   Sodium 139 135 - 145 mmol/L   Potassium 4.3 3.5 - 5.1 mmol/L   Chloride 105 98 - 111 mmol/L   CO2 25 22 - 32 mmol/L   Glucose, Bld 97 70 - 99 mg/dL    Comment: Glucose reference range applies only to samples taken after fasting for at least 8 hours.   BUN 27 (H) 8 - 23 mg/dL   Creatinine, Ser 1.36 (H) 0.44 - 1.00 mg/dL   Calcium 9.2 8.9 - 10.3 mg/dL   GFR calc non Af Amer 33 (L) >60 mL/min   GFR calc Af Amer 38 (L) >60 mL/min   Anion gap 9 5 - 15    Comment: Performed at [facility], [place] [address], [place], [place] [address]  CBC with Differential     Status: Abnormal   Collection Time: 01/04/20  3:06 PM  Result Value Ref Range   WBC 10.2 4.0 - 10.5 K/uL   RBC 3.52 (L) 3.87 - 5.11 MIL/uL   Hemoglobin 11.7 (L) 12.0 - 15.0 g/dL   HCT 35.1 (L) 36 - 46 %   MCV 99.7 80.0 - 100.0 fL   MCH 33.2 26.0 - 34.0 pg   MCHC 33.3 30.0 - 36.0 g/dL   RDW 14.1 11.5 - 15.5 %   Platelets 304 150 - 400 K/uL   nRBC 0.0 0.0 - 0.2 %   Neutrophils Relative % 66 %   Neutro Abs 6.6 1.7 - 7.7 K/uL   Lymphocytes Relative 18 %   Lymphs Abs 1.8 0.7 - 4.0 K/uL   Monocytes Relative 12 %   Monocytes Absolute 1.3 (H) 0 - 1 K/uL   Eosinophils Relative 3 %   Eosinophils Absolute 0.3 0 - 0 K/uL   Basophils Relative 1 %    Basophils Absolute 0.1 0 - 0 K/uL   Immature Granulocytes 0 %   Abs Immature Granulocytes 0.03 0.00 - 0.07 K/uL    Comment: Performed at [facility], [place] [address], [place], [place] [address]    DG Ribs Unilateral W/Chest Left  Result Date: 01/04/2020 CLINICAL DATA:  Fall.  Left rib pain. EXAM: LEFT RIBS AND CHEST - 3+ VIEW COMPARISON:  03/05/2019 FINDINGS: Elevation of the left hemidiaphragm, stable. Left base atelectasis. No effusion or pneumothorax. Heart is normal size. No visible displaced rib fracture. No visible displaced rib fracture. Prior left shoulder replacement. Chronic elevation of the left hemidiaphragm.  Left base atelectasis. IMPRESSION: Negative. Electronically Signed   By: Rolm Baptise M.D.   On: 01/04/2020 16:08     A/P: Cindy [name] is an 86 y.o. female with multiple traumatic injuries after a partial fall a couple days ago.  She is having very little pain or difficulty breathing currently.  I am unable to review CT report but ED MD states it is read as L 2-7 rib fractures and perinephric hematoma. Currently reversing her anticoagulation.  Will transfer to [facility] [place] for trauma service to follow and monitor for signs of bleeding.  Discussed with on call trauma MD.  Rec good pulmonary toilet with incentive spirometry and good pain control to prevent worsening complications from ribs fractures.     Rosario Adie, MD  [facility]

## 2020-01-04 NOTE — H&P (Addendum)
Cindy [name] is an 86 y.o. female.   Chief Complaint: Left sided rib pain. HPI: The patient is a 86 yr old woman who was hospitalized in the first week of August with a saddle embolus with right heart strain. She was discharged on Eliquis with which she has been compliant. Last night upon returning from the bathroom the patient fell against the wooden foot board of her bed.  In the ED she is found to have hypertension, normal temperature, and oxygen saturation on 98%. Systolic pressures have been 329-518 and Diastolic pressure have been 78-100.  Lab studies revealed creatinine of 1.36. It appears that her baseline creatinine of 0.91. WBC is 10.2 and hemoglobin is 11.7. Her most recent hemoglobin prior to this presentation is 12.0. CT of the chest demonstrated rib fractures and left subcapsular renal hematoma.   The patient denies discomfort at this time. She denies cough, shortness of breath, chest pain, nausea, vomiting, back pain, pelvic pain, constipation, diarrhea, lesions, rashes, or sores. She denies neurological changes.  [facility] were consulted to admit the patient for further evaluation and care. Trauma surgery was consulted by EDP. They have recommended reversal of the Eliquis and initiation of heparin drip tomorrow. They also have asked that the patient be admitted to [facility].  Past Medical History:  Diagnosis Date  . Arthritis   . Chronic renal insufficiency, stage III (moderate)   . Depression   . Dyslipidemia   . Forgetfulness   . GERD (gastroesophageal reflux disease)    takes Omeprazole for Hital Hernia   . Hiatal hernia   . History of nuclear stress test 2005   persantine; inferoapical ischmeia w/ EF 83%; no CAD at cath  . HOH (hard of hearing)    hearing aids  . Hypertension   . Hypothyroidism   . LBBB (left bundle branch block)    chronic per Dr. Gwenlyn Found  . Lung nodule 07/01/03   right middle lobe  . Macular degeneration   . Malignant melanoma ([place])  11/12/04   L upper arm, MOHS_ right hand, leg  . Murmur, cardiac   . Osteoporosis   . Pneumonia 06/2018    Past Surgical History:  Procedure Laterality Date  . BLADDER REPAIR    . CARDIAC CATHETERIZATION  2005/2007   normal coronaries   . CATARACT EXTRACTION     bilat  . coccyx  1951   excision  . COLONOSCOPY    . knee     right cartilage repair  . REVERSE SHOULDER ARTHROPLASTY Left 08/04/2018   Procedure: REVERSE LEFT SHOULDER ARTHROPLASTY;  Surgeon: Netta Cedars, MD;  Location: [facility];  Service: Orthopedics;  Laterality: Left;  . ROTATOR CUFF REPAIR     right  . TOTAL ABDOMINAL HYSTERECTOMY W/ BILATERAL SALPINGOOPHORECTOMY    . TRANSTHORACIC ECHOCARDIOGRAM  2007   EF=>55%; borderline conc LVH; LA mod dilated; RA mild-mod dilated; mild TR; mild AVR with mildly sclerotic AV leavlefts; mild PV regurg     Family History  Problem Relation Age of Onset  . Cancer Father        NHL  . Cancer Maternal Grandmother        rectal  . Cancer Daughter        breast  . Cancer Maternal Aunt        breast  . Cancer Son        melanoma   Social History:  reports that she has never smoked. She has never used smokeless tobacco. She reports  that she does not drink alcohol and does not use drugs. (Not in a hospital admission)   Allergies:  Allergies  Allergen Reactions  . Fish Allergy Hives  . Diclofenac Other (See Comments)    unknown  . Ibuprofen Other (See Comments)    Affects kidney function  . Sulfa Antibiotics     Unknown reaction    Pertinent items noted in HPI and remainder of comprehensive ROS otherwise negative.   General appearance: alert, cooperative and no distress Head: Normocephalic, without obvious abnormality, atraumatic Eyes: conjunctivae/corneas clear. PERRL, EOM's intact. Fundi benign. Throat: lips, mucosa, and tongue normal; teeth and gums normal Neck: no adenopathy, no carotid bruit, no JVD, supple, symmetrical, trachea midline and thyroid not enlarged,  symmetric, no tenderness/mass/nodules Resp: No increased work of breathing. No wheezes, rales, or rhonchi. No tactile fremitus. Chest wall: no tenderness Cardio: regular rate and rhythm, S1, S2 normal, no murmur, click, rub or gallop GI: soft, non-tender; bowel sounds normal; no masses,  no organomegaly Extremities: extremities normal, atraumatic, no cyanosis or edema Pulses: 2+ and symmetric Skin: Skin color, texture, turgor normal. No rashes or lesions Lymph nodes: Cervical, supraclavicular, and axillary nodes normal. Neurologic: Alert and oriented X 3, normal strength and tone. Normal symmetric reflexes. Normal coordination and gait  Results for orders placed or performed during the hospital encounter of 01/04/20 (from the past 48 hour(s))  Basic metabolic panel     Status: Abnormal   Collection Time: 01/04/20  3:06 PM  Result Value Ref Range   Sodium 139 135 - 145 mmol/L   Potassium 4.3 3.5 - 5.1 mmol/L   Chloride 105 98 - 111 mmol/L   CO2 25 22 - 32 mmol/L   Glucose, Bld 97 70 - 99 mg/dL    Comment: Glucose reference range applies only to samples taken after fasting for at least 8 hours.   BUN 27 (H) 8 - 23 mg/dL   Creatinine, Ser 1.36 (H) 0.44 - 1.00 mg/dL   Calcium 9.2 8.9 - 10.3 mg/dL   GFR calc non Af Amer 33 (L) >60 mL/min   GFR calc Af Amer 38 (L) >60 mL/min   Anion gap 9 5 - 15    Comment: Performed at [facility], [place] [address], [place], [place] [address]  CBC with Differential     Status: Abnormal   Collection Time: 01/04/20  3:06 PM  Result Value Ref Range   WBC 10.2 4.0 - 10.5 K/uL   RBC 3.52 (L) 3.87 - 5.11 MIL/uL   Hemoglobin 11.7 (L) 12.0 - 15.0 g/dL   HCT 35.1 (L) 36 - 46 %   MCV 99.7 80.0 - 100.0 fL   MCH 33.2 26.0 - 34.0 pg   MCHC 33.3 30.0 - 36.0 g/dL   RDW 14.1 11.5 - 15.5 %   Platelets 304 150 - 400 K/uL   nRBC 0.0 0.0 - 0.2 %   Neutrophils Relative % 66 %   Neutro Abs 6.6 1.7 - 7.7 K/uL   Lymphocytes Relative 18 %   Lymphs Abs  1.8 0.7 - 4.0 K/uL   Monocytes Relative 12 %   Monocytes Absolute 1.3 (H) 0 - 1 K/uL   Eosinophils Relative 3 %   Eosinophils Absolute 0.3 0 - 0 K/uL   Basophils Relative 1 %   Basophils Absolute 0.1 0 - 0 K/uL   Immature Granulocytes 0 %   Abs Immature Granulocytes 0.03 0.00 - 0.07 K/uL    Comment: Performed at [facility]  [place] [address] [place], [place] [address]   @[email or web address] @  Blood pressure (!) 172/78, pulse 64, temperature 98.8 F (37.1 C), temperature source Oral, resp. rate (!) 21, height 5' (1.524 m), weight 47.4 kg, SpO2 95 %.   Assessment/Plan Problem  Hemorrhage of Right Kidney  Renal Hemorrhage, Left  Rib Fractures  Pulmonary Emboli (Hcc)  Dementia (Hcc)  Uncontrolled Hypertension  Cri (Chronic Renal Insufficiency), Stage 4 (Severe) (Hcc)  Essential Hypertension   Hemorrhage of right kidney: Pt has no pain. Hemoglobin appears stable. Will follow H&H. Will reverse eliquis as per recommendation of Trauma surgery and plan to start heparin drip in the morning.   Rib Fractures: Currently the patient has no pain. It does not appear to be impairing her respirations in any way. Will make pain control available and encourage use of incentive spirometry.  History of saddle embolus with right heart strain on 11/16/2019. The patient has been taking Eliquis at home. She is compliant with her medications. She states that the last time she took it was this morning. Per the recommendation of trauma surgery will hold Eliquis, give the patient K-centra to reverse its effects and plan to start on heparin in the morning.  Dementia: Noted. Pt does appear to be alert and oriented today.  Uncontrolled hypertension: The patient is not on any antihypertensives at home. I have added norvasc and hydrochlorothiazide.  I have seen and examined this patient myself. I have spent 82 minutes in her evaluation and care.  DVT Prophylaxis: SCD's CODE STATUS: DNR Family  Communication: None available Disposition: The patient will be admitted to a progressive care bed as inaptient at [facility]. Status is: Inpatient  Remains inpatient appropriate because:Inpatient level of care appropriate due to severity of illness  Dispo: The patient is from: Home              Anticipated d/c is to: Home              Anticipated d/c date is: 2 days              Patient currently is not medically stable to d/c.  Severity of Illness: The appropriate patient status for this patient is INPATIENT. Inpatient status is judged to be reasonable and necessary in order to provide the required intensity of service to ensure the patient's safety. The patient's presenting symptoms, physical exam findings, and initial radiographic and laboratory data in the context of their chronic comorbidities is felt to place them at high risk for further clinical deterioration. Furthermore, it is not anticipated that the patient will be medically stable for discharge from the hospital within 2 midnights of admission. The following factors support the patient status of inpatient.   " The patient's presenting symptoms include rib pain. " The worrisome physical exam findings include rib pain. " The initial radiographic and laboratory data are worrisome because of multiple rib fractures and subcapsular renal hemorrhage. " The chronic co-morbidities include recent history of saddle embolus with right sided heart strain. She is on eliquis  * I certify that at the point of admission it is my clinical judgment that the patient will require inpatient hospital care spanning beyond 2 midnights from the point of admission due to high intensity of service, high risk for further deterioration and high frequency of surveillance required.*  Aidan Moten 01/04/2020, 9:46 PM  ADDENDUM: Pt with new mews score of 4 due to blood pressure of 221/90. IV hydralazine ordered. Options  are limited due to the  patient's relatively low heart rate, her elevated creatinine, and the stress on her kidney from renal hemorrhage.

## 2020-01-04 NOTE — ED Triage Notes (Signed)
Patient reports loosing balance and falling into bed last night. C/o left rib pain radiating to under left breast. Denies chest pain and SOB. Hx PE.

## 2020-01-05 LAB — COMPREHENSIVE METABOLIC PANEL WITH GFR
ALT: 13 U/L (ref 0–44)
AST: 17 U/L (ref 15–41)
Albumin: 3.2 g/dL — ABNORMAL LOW (ref 3.5–5.0)
Alkaline Phosphatase: 44 U/L (ref 38–126)
Anion gap: 12 (ref 5–15)
BUN: 20 mg/dL (ref 8–23)
CO2: 22 mmol/L (ref 22–32)
Calcium: 9.1 mg/dL (ref 8.9–10.3)
Chloride: 108 mmol/L (ref 98–111)
Creatinine, Ser: 1.01 mg/dL — ABNORMAL HIGH (ref 0.44–1.00)
GFR calc Af Amer: 55 mL/min — ABNORMAL LOW (ref >60)
GFR calc non Af Amer: 47 mL/min — ABNORMAL LOW (ref >60)
Glucose, Bld: 83 mg/dL (ref 70–99)
Potassium: 4.3 mmol/L (ref 3.5–5.1)
Sodium: 142 mmol/L (ref 135–145)
Total Bilirubin: 0.8 mg/dL (ref 0.3–1.2)
Total Protein: 6.2 g/dL — ABNORMAL LOW (ref 6.5–8.1)

## 2020-01-05 LAB — CBC
HCT: 35.4 % — ABNORMAL LOW (ref 36.0–46.0)
Hemoglobin: 11.7 g/dL — ABNORMAL LOW (ref 12.0–15.0)
MCH: 32.9 pg (ref 26.0–34.0)
MCHC: 33.1 g/dL (ref 30.0–36.0)
MCV: 99.4 fL (ref 80.0–100.0)
Platelets: 288 10*3/uL (ref 150–400)
RBC: 3.56 MIL/uL — ABNORMAL LOW (ref 3.87–5.11)
RDW: 14 % (ref 11.5–15.5)
WBC: 9.5 10*3/uL (ref 4.0–10.5)
nRBC: 0 % (ref 0.0–0.2)

## 2020-01-05 MED ORDER — AMLODIPINE BESYLATE 10 MG PO TABS
10.0000 mg | ORAL_TABLET | Freq: Every day | ORAL | Status: DC
  Administered 2020-01-05 – 2020-01-06 (×2): 10 mg via ORAL
  Filled 2020-01-05 (×2): qty 1

## 2020-01-05 MED ORDER — LIDOCAINE 5 % EX PTCH
1.0000 | MEDICATED_PATCH | CUTANEOUS | Status: DC
  Administered 2020-01-05 – 2020-01-07 (×3): 1 via TRANSDERMAL
  Filled 2020-01-05 (×2): qty 1

## 2020-01-05 NOTE — ED Notes (Signed)
[facility] called for pt transfer to [facility]

## 2020-01-05 NOTE — Progress Notes (Signed)
Subjective/Chief Complaint: Patient recently transferred from [place] know why she is in the hospital   Objective: Vital signs in last 24 hours: Temp:  [97.3 F (36.3 C)-98.8 F (37.1 C)] 97.3 F (36.3 C) (09/25 0806) Pulse Rate:  [54-73] 66 (09/25 0806) Resp:  [9-29] 16 (09/25 0806) BP: (152-221)/(70-161) 165/100 (09/25 0806) SpO2:  [91 %-100 %] 91 % (09/25 0620) Weight:  [47.4 kg] 47.4 kg (09/24 2022) Last BM Date: 01/04/20  Intake/Output from previous day: 09/24 0701 - 09/25 0700 In: 80 [IV Piggyback:80] Out: -  Intake/Output this shift: No intake/output data recorded.  Elderly, frail, Awake, alert, conversant Mild L lateral chest wall tenderness Lungs - CTA B  Lab Results:  Recent Labs    01/04/20 1506 01/05/20 0525  WBC 10.2 9.5  HGB 11.7* 11.7*  HCT 35.1* 35.4*  PLT 304 288   BMET Recent Labs    01/04/20 1506 01/05/20 0525  NA 139 142  K 4.3 4.3  CL 105 108  CO2 25 22  GLUCOSE 97 83  BUN 27* 20  CREATININE 1.36* 1.01*  CALCIUM 9.2 9.1   PT/INR No results for input(s): LABPROT, INR in the last 72 hours. ABG No results for input(s): PHART, HCO3 in the last 72 hours.  Invalid input(s): PCO2, PO2  Studies/Results: DG Ribs Unilateral W/Chest Left  Result Date: 01/04/2020 CLINICAL DATA:  Fall.  Left rib pain. EXAM: LEFT RIBS AND CHEST - 3+ VIEW COMPARISON:  03/05/2019 FINDINGS: Elevation of the left hemidiaphragm, stable. Left base atelectasis. No effusion or pneumothorax. Heart is normal size. No visible displaced rib fracture. No visible displaced rib fracture. Prior left shoulder replacement. Chronic elevation of the left hemidiaphragm.  Left base atelectasis. IMPRESSION: Negative. Electronically Signed   By: Rolm Baptise M.D.   On: 01/04/2020 16:08   CT Chest W Contrast  Result Date: 01/04/2020 CLINICAL DATA:  Abdominal trauma, loss of balance and fell into bed last night with left rib pain radiating subjacent to the left  breast EXAM: CT CHEST, ABDOMEN, AND PELVIS WITH CONTRAST TECHNIQUE: Multidetector CT imaging of the chest, abdomen and pelvis was performed following the standard protocol during bolus administration of intravenous contrast. CONTRAST:  46mL OMNIPAQUE IOHEXOL 300 MG/ML  SOLN COMPARISON:  CT 11/16/2019 FINDINGS: CT CHEST FINDINGS Cardiovascular: Calcifications of the aortic leaflets. Atherosclerotic plaque within the normal caliber aorta. No acute luminal abnormality of the imaged aorta. No periaortic stranding or hemorrhage. Normal 3 vessel branching of the aortic arch. Proximal great vessels are calcified but free of acute abnormality. Evaluation is not tailored for the evaluation of the pulmonary arteries with further limitation due to extensive motion artifact. There has been interval resolution of the larger pulmonary embolic burden within the right main pulmonary artery. No new large emboli are identified. Mediastinum/Nodes: No mediastinal fluid or gas. No worrisome thyroid lesions. No acute abnormality of the trachea or esophagus. No worrisome mediastinal, hilar or axillary adenopathy. Lungs/Pleura: Interlobular septal thickening, redistribution of the pulmonary vascularity and some mild central cuffing is suggestive of interstitial pulmonary edema. There is a trace left pleural effusion. 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 No pneumothorax. No direct visible injury of the lung parenchyma is evident. Stable appearance of a 14 mm right perihilar nodule. Musculoskeletal: Minimally displaced fracture left lateral-anterolateral sixth through eleventh ribs are noted. Associated thickening of the intercostalis musculature with some contusive changes  along the left chest wall and flank. There are additional more age indeterminate left rib fractures involving the first through fifth ribs  anteriorly as well. Prior left reverse shoulder arthroplasty. No evidence of acute hardware complication. Prior distal right clavicular resection. 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 No acute vertebral body fracture or height loss. Unchanged mild stepwise anterolisthesis T2-T4. CT ABDOMEN PELVIS FINDINGS Hepatobiliary: Diffuse hepatic hypoattenuation compatible with hepatic steatosis. No worrisome focal liver lesions. Smooth liver surface contour. Few dependently layering gallstones present in the gallbladder. Normal gallbladder distention. No visible biliary ductal dilatation or intraductal gallstones. Pancreas: Atrophic appearance of the pancreas redemonstration of a cystic lesion near the pancreatic uncinate (2/57) decreased in size now measuring 1.5 x 1.0 cm, previously 2.5 x 1.6 cm. No peripancreatic inflammation or ductal dilatation. 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 No convincing acute splenic injury is identified. Adrenals/Urinary Tract: Stable lobular thickening of the adrenal glands. Intermediate attenuation, likely hemorrhage within a large cyst seen arising from the upper pole left kidney measuring up to 5.8 x 4.7 cm in size (2/55). Additional subcapsular hemorrhage seen along the posterior aspect of the left kidney as well (2/57). No discernible left renal laceration, discernible vascular injury or devascularization. No right renal injury. Few subcentimeter hypertension foci in both kidneys too small to fully characterize on CT imaging but statistically likely benign. No urolithiasis or hydronephrosis. Moderate bladder distension. No evidence of direct bladder injury. Stomach/Bowel: Much of the stomach is contained within the large diaphragmatic eventration with a degree of mesentero-axial rotation. No resulting obstruction is evident at this time. No focal small bowel thickening or dilatation. Appendix  is not visualized. No focal inflammation the vicinity of the cecum to suggest an occult appendicitis. Portion of transverse colon/splenic flexure contained within the diaphragmatic eventration as well without resulting mechanical obstruction or vascular compromise. There is some mild segmental thickening of the distal colon/sigmoid in a region of numerous colonic diverticula but without evidence of active focal diverticular inflammation at this time, could reflect a mild colitis or sequela of prior diverticulitis. Vascular/Lymphatic: No acute traumatic vascular injury within the abdomen or pelvis. No acute luminal abnormality. Atherosclerotic calcifications within the abdominal aorta and branch vessels. No aneurysm or ectasia. No enlarged abdominopelvic lymph nodes. Reproductive: Uterus is surgically absent. No concerning adnexal lesions. Other: No free fluid or free air. Contusive changes along the left flank and abdominal wall, as above. Fat containing bilateral inguinal hernias with a small amount low-attenuation fluid in the right inguinal hernia sac as well. Musculoskeletal: Small amount of hypoattenuation seen adjacent the superior aspect of left psoas (2/56), low-attenuation with could reflect a small intramuscular injury. No large intramuscular hemorrhage. Musculature is otherwise normal and symmetric with some fatty atrophy likely related to disuse/patient age. The osseous structures appear diffusely demineralized which may limit detection of small or nondisplaced fractures. Multilevel degenerative changes are present in the imaged portions of the spine. No acute vertebral body fracture or height loss. No clear fracture or traumatic malalignment of the bony pelvis. Grade 1 anterolisthesis L1 on L2 and grade 2 anterolisthesis L4 on L5 are unchanged from prior without pars defects. IMPRESSION: Traumatic 1. Minimally displaced fractures of the left lateral to anterolateral sixth through eleventh ribs with  adjacent chest wall contusive changes and thickening of the intercostal is musculature. Small left-sided effusion, likely small volume hemothorax. No pneumothorax. 2. Additional age indeterminate left rib fractures involving the first  through fifth ribs anteriorly as well. 3. AAST Grade II Renal Injury - Subcapsular hematoma along the posterior left kidney (approximately 10% surface area) as well as development of intracystic hemorrhage in a previously simple appearing cyst in the upper pole. No discernible left renal laceration, discrete vascular injury or devascularization. 4. Trace simple attenuation fluid along the superior aspect of the left psoas musculature, could suggest intramuscular injury without large intramuscular or retroperitoneal hemorrhage. Nontraumatic 1. Segmental thickening of the distal colon/sigmoid in a region of numerous colonic diverticula but without evidence of active focal diverticular inflammation at this time, could reflect a mild colitis or sequela of prior diverticulitis. 2. Interval resolution of the previously seen pulmonary embolic burden without new emboli though evaluation is significantly limited in regards to assessment of the pulmonary arteries. 3. Hepatic steatosis. 4. Cholelithiasis without evidence of acute cholecystitis. 5. Stable 14 mm right perihilar nodule since 11/16/2019 though within overall increase in size from 2013 where this measured only 11 mm. Consider outpatient pulmonology referral if not previously obtained. 6. Redemonstration of a cystic lesion near the pancreatic uncinate measuring only 1.5 cm at this time. Consider reimaging every 2 years to assess for interval change with pancreatic protocol CT or MR imaging. This recommendation follows ACR consensus guidelines: Management of Incidental Pancreatic Cysts: A White Paper of the ACR Incidental Findings Committee. J Am Coll Radiol 0865;78:469-629. 7. Aortic Atherosclerosis (ICD10-I70.0). Electronically Signed    By: Lovena Le M.D.   On: 01/04/2020 19:53   CT ABDOMEN PELVIS W CONTRAST  Result Date: 01/04/2020 CLINICAL DATA:  Abdominal trauma, loss of balance and fell into bed last night with left rib pain radiating subjacent to the left breast EXAM: CT CHEST, ABDOMEN, AND PELVIS WITH CONTRAST TECHNIQUE: Multidetector CT imaging of the chest, abdomen and pelvis was performed following the standard protocol during bolus administration of intravenous contrast. CONTRAST:  59mL OMNIPAQUE IOHEXOL 300 MG/ML  SOLN COMPARISON:  CT 11/16/2019 FINDINGS: CT CHEST FINDINGS Cardiovascular: Calcifications of the aortic leaflets. Atherosclerotic plaque within the normal caliber aorta. No acute luminal abnormality of the imaged aorta. No periaortic stranding or hemorrhage. Normal 3 vessel branching of the aortic arch. Proximal great vessels are calcified but free of acute abnormality. Evaluation is not tailored for the evaluation of the pulmonary arteries with further limitation due to extensive motion artifact. There has been interval resolution of the larger pulmonary embolic burden within the right main pulmonary artery. No new large emboli are identified. Mediastinum/Nodes: No mediastinal fluid or gas. No worrisome thyroid lesions. No acute abnormality of the trachea or esophagus. No worrisome mediastinal, hilar or axillary adenopathy. Lungs/Pleura: Interlobular septal thickening, redistribution of the pulmonary vascularity and some mild central cuffing is suggestive of interstitial pulmonary edema. There is a trace left pleural effusion. 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 No pneumothorax. No direct visible injury of the lung parenchyma is evident. Stable appearance of a 14 mm right perihilar nodule. Musculoskeletal: Minimally displaced fracture left lateral-anterolateral sixth through eleventh  ribs are noted. Associated thickening of the intercostalis musculature with some contusive changes along the left chest wall and flank. There are additional more age indeterminate left rib fractures involving the first through fifth ribs anteriorly as well. Prior left reverse shoulder arthroplasty. No evidence of acute hardware complication. Prior distal right clavicular resection. 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  No acute vertebral body fracture or height loss. Unchanged mild stepwise anterolisthesis T2-T4. CT ABDOMEN PELVIS FINDINGS Hepatobiliary: Diffuse hepatic hypoattenuation compatible with hepatic steatosis. No worrisome focal liver lesions. Smooth liver surface contour. Few dependently layering gallstones present in the gallbladder. Normal gallbladder distention. No visible biliary ductal dilatation or intraductal gallstones. Pancreas: Atrophic appearance of the pancreas redemonstration of a cystic lesion near the pancreatic uncinate (2/57) decreased in size now measuring 1.5 x 1.0 cm, previously 2.5 x 1.6 cm. No peripancreatic inflammation or ductal dilatation. 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 No convincing acute splenic injury is identified. Adrenals/Urinary Tract: Stable lobular thickening of the adrenal glands. Intermediate attenuation, likely hemorrhage within a large cyst seen arising from the upper pole left kidney measuring up to 5.8 x 4.7 cm in size (2/55). Additional subcapsular hemorrhage seen along the posterior aspect of the left kidney as well (2/57). No discernible left renal laceration, discernible vascular injury or devascularization. No right renal injury. Few subcentimeter hypertension foci in both kidneys too small to fully characterize on CT imaging but statistically likely benign. No urolithiasis or hydronephrosis. Moderate bladder distension. No evidence of direct bladder injury.  Stomach/Bowel: Much of the stomach is contained within the large diaphragmatic eventration with a degree of mesentero-axial rotation. No resulting obstruction is evident at this time. No focal small bowel thickening or dilatation. Appendix is not visualized. No focal inflammation the vicinity of the cecum to suggest an occult appendicitis. Portion of transverse colon/splenic flexure contained within the diaphragmatic eventration as well without resulting mechanical obstruction or vascular compromise. There is some mild segmental thickening of the distal colon/sigmoid in a region of numerous colonic diverticula but without evidence of active focal diverticular inflammation at this time, could reflect a mild colitis or sequela of prior diverticulitis. Vascular/Lymphatic: No acute traumatic vascular injury within the abdomen or pelvis. No acute luminal abnormality. Atherosclerotic calcifications within the abdominal aorta and branch vessels. No aneurysm or ectasia. No enlarged abdominopelvic lymph nodes. Reproductive: Uterus is surgically absent. No concerning adnexal lesions. Other: No free fluid or free air. Contusive changes along the left flank and abdominal wall, as above. Fat containing bilateral inguinal hernias with a small amount low-attenuation fluid in the right inguinal hernia sac as well. Musculoskeletal: Small amount of hypoattenuation seen adjacent the superior aspect of left psoas (2/56), low-attenuation with could reflect a small intramuscular injury. No large intramuscular hemorrhage. Musculature is otherwise normal and symmetric with some fatty atrophy likely related to disuse/patient age. The osseous structures appear diffusely demineralized which may limit detection of small or nondisplaced fractures. Multilevel degenerative changes are present in the imaged portions of the spine. No acute vertebral body fracture or height loss. No clear fracture or traumatic malalignment of the bony pelvis. Grade  1 anterolisthesis L1 on L2 and grade 2 anterolisthesis L4 on L5 are unchanged from prior without pars defects. IMPRESSION: Traumatic 1. Minimally displaced fractures of the left lateral to anterolateral sixth through eleventh ribs with adjacent chest wall contusive changes and thickening of the intercostal is musculature. Small left-sided effusion, likely small volume hemothorax. No pneumothorax. 2. Additional age indeterminate left rib fractures involving the first through fifth ribs anteriorly as well. 3. AAST Grade II Renal Injury - Subcapsular hematoma along the posterior left kidney (approximately 10% surface area) as well as development of intracystic hemorrhage in a previously simple appearing cyst in the upper pole. No discernible left renal laceration, discrete vascular injury or devascularization. 4. Trace simple attenuation fluid  along the superior aspect of the left psoas musculature, could suggest intramuscular injury without large intramuscular or retroperitoneal hemorrhage. Nontraumatic 1. Segmental thickening of the distal colon/sigmoid in a region of numerous colonic diverticula but without evidence of active focal diverticular inflammation at this time, could reflect a mild colitis or sequela of prior diverticulitis. 2. Interval resolution of the previously seen pulmonary embolic burden without new emboli though evaluation is significantly limited in regards to assessment of the pulmonary arteries. 3. Hepatic steatosis. 4. Cholelithiasis without evidence of acute cholecystitis. 5. Stable 14 mm right perihilar nodule since 11/16/2019 though within overall increase in size from 2013 where this measured only 11 mm. Consider outpatient pulmonology referral if not previously obtained. 6. Redemonstration of a cystic lesion near the pancreatic uncinate measuring only 1.5 cm at this time. Consider reimaging every 2 years to assess for interval change with pancreatic protocol CT or MR imaging. This  recommendation follows ACR consensus guidelines: Management of Incidental Pancreatic Cysts: A White Paper of the ACR Incidental Findings Committee. J Am Coll Radiol 3428;76:[id number]. 7. Aortic Atherosclerosis (ICD10-I70.0). Electronically Signed   By: Lovena Le M.D.   On: 01/04/2020 19:53    Anti-infectives: Anti-infectives (From admission, onward)   None      Assessment/Plan: Fall on anticoagulation Left 6-11 rib fractures with chest wall contusion Small left effusion/ hemothorax Grade II left renal hematoma - Hgb stable  OK to restart anticoagulation - no boluses if using heparin Incentive spirometer OOB to chair  LOS: 1 day    Maia Petties 01/05/2020

## 2020-01-05 NOTE — ED Notes (Signed)
Attempted to call report for pt transfer to [facility], nurse unavailable at this time. Will await call back

## 2020-01-05 NOTE — Progress Notes (Signed)
PROGRESS NOTE  Cindy [name]  DOB: Oct 14, 1922  PCP: Lorne Skeens, MD KDX:[id number]  DOA: 01/04/2020  LOS: 1 day   Chief Complaint  Patient presents with  . Fall   Brief narrative: Cindy [name] is a 86 y.o. female with PMH of HTN, HLD, CAD, CKD 3, GERD, hypothyroidism, osteoporosis, arthritis who was hospitalized in August with a saddle pulmonary embolism and discharged on Eliquis.  Patient presented to the ED on 01/04/2020 after a fall at home.   Patient lives at home, has a caregiver during the day.  I spoke with her caregiver Mr. Maurice March to clarify the history.  On the morning of 9/23 while returning from the bathroom, she lost her balance and fell against a tall wooden footboard of the bed hitting the left side of her chest.  This was witnessed by her caregiver who states she did not fall to the floor.  She did not have any premonition symptoms of lightheadedness, chest pain or dizziness. She had minimal pain only at that time.  Over the next 24 hours, pain started to worsen and hence she was brought to the ED on 9/24.  In the ED, her blood pressure was elevated upto 200, otherwise hemodynamically stable. CT of the chest showed minimally displaced fractures of the 6th-11th ribs on the left as well as age-indeterminate fractures of the 1st-5th ribs on the same side.  It also showed left subcapsular renal hematoma.  Trauma surgery was consulted by EDP. They recommended reversal of the Eliquis and hence the patient Kcentra.   Patient was admitted to hospitalist service.    Subjective: Patient was seen and examined this morning. Chart reviewed No fever, bradycardic in 50s, blood pressure remains elevated between 180s to 220s. Repeat blood work this morning with no change in hemoglobin at 11.7  Assessment/Plan: Left sided rib fractures -Acute fracture of the 6th-11th ribs and age-indeterminate fractures of 1st-5th ribs. -Patient may have had a fall previously as well. -Pain  control with lidocaine patch, oral pain medicines. -Encourage use of incentive spirometry  Left kidney subcapsular hematoma -Hemoglobin stable. -Eliquis on hold.  Fall due to loss of balance -PT eval ordered  Recent history of saddle pulmonary embolus with right heart strain on 11/16/2019 -Was taking Eliquis at home.  Last dose on the morning of 9/24.  -Eliquis held.  Kcentra given.    Hypertensive urgency -Blood pressure persistently elevated over 180s to up to 220s -Patient's caregiver states that she used to be on blood pressure medicines in the past but they gradually stopped and her blood pressure at home usually is in 120s and 130s.  -On admission, she was started on Norvasc and HCTZ.  I would avoid HCTZ because of her advanced age.  Monitor on amlodipine 10 mg daily. -Continue monitor blood pressure.  IV hydralazine as needed.  Hypothyroidism -Continue Synthroid  Hyperlipidemia -Continue simvastatin   Dementia -Supportive care  Mobility: PT eval Code Status:   Code Status: DNR  Nutritional status: Body mass index is 20.43 kg/m.     Diet Order            Diet regular Room service appropriate? Yes; Fluid consistency: Thin  Diet effective now                 DVT prophylaxis: Place and maintain sequential compression device Start: 01/05/20 1126SCDs   Antimicrobials:  None Fluid: None Consultants: Trauma surgery Family Communication:  Discussed with caregiver on the phone  Status is: Inpatient  Remains inpatient appropriate because:Ongoing active pain requiring inpatient pain management, Ongoing diagnostic testing needed not appropriate for outpatient work up and IV treatments appropriate due to intensity of illness or inability to take PO   Dispo: The patient is from: Home              Anticipated d/c is to: Home              Anticipated d/c date is: 2 days              Patient currently is not medically stable to d/c.       Infusions:     Scheduled Meds: . amLODipine  10 mg Oral Daily  . hydroxypropyl methylcellulose / hypromellose  1 drop Both Eyes Daily  . levothyroxine  75 mcg Oral QAC breakfast  . lidocaine  1 patch Transdermal Q24H  . mirabegron ER  50 mg Oral Daily  . pantoprazole  40 mg Oral Daily  . polyethylene glycol  17 g Oral Daily  . triamcinolone  1 application Topical QODAY    Antimicrobials: Anti-infectives (From admission, onward)   None      PRN meds: acetaminophen **OR** acetaminophen, albuterol, bisacodyl, hydrALAZINE, ondansetron **OR** ondansetron (ZOFRAN) IV, polyethylene glycol, traMADol   Objective: Vitals:   01/05/20 0620 01/05/20 0806  BP: (!) 205/161 (!) 165/100  Pulse: 73 66  Resp: (!) 22 16  Temp: (!) 97.4 F (36.3 C) (!) 97.3 F (36.3 C)  SpO2: 91%     Intake/Output Summary (Last 24 hours) at 01/05/2020 1127 Last data filed at 01/05/2020 0030 Gross per 24 hour  Intake 80 ml  Output --  Net 80 ml   Filed Weights   01/04/20 2022  Weight: 47.4 kg   Weight change:  Body mass index is 20.43 kg/m.   Physical Exam: General exam: Appears calm and comfortable.  Not in distress Skin: No rashes, lesions or ulcers. HEENT: Atraumatic, normocephalic, supple neck, no obvious bleeding Lungs: Clear to auscultation bilaterally.  Left chest wall has only minimal tenderness CVS: Regular rate and rhythm, no murmur GI/Abd soft, nontender, nondistended, bowel sound present CNS: Alert, awake, oriented to place and person which is her baseline Psychiatry: Mood appropriate Extremities: No pedal edema, no calf tenderness  Data Review: I have personally reviewed the laboratory data and studies available.  Recent Labs  Lab 01/04/20 1506 01/05/20 0525  WBC 10.2 9.5  NEUTROABS 6.6  --   HGB 11.7* 11.7*  HCT 35.1* 35.4*  MCV 99.7 99.4  PLT 304 288   Recent Labs  Lab 01/04/20 1506 01/05/20 0525  NA 139 142  K 4.3 4.3  CL 105 108  CO2 25 22  GLUCOSE 97 83  BUN 27* 20   CREATININE 1.36* 1.01*  CALCIUM 9.2 9.1    F/u labs ordered  Signed, Terrilee Croak, MD [facility] 01/05/2020

## 2020-01-05 NOTE — Progress Notes (Signed)
Spoke with Dr. Myna Hidalgo regarding patient code order is DNR, but patient wants to be FULL code. RN and Dr. Myna Hidalgo discussed that the attending should talk to the patient about the code status due to her age and comorbidities.

## 2020-01-06 LAB — BASIC METABOLIC PANEL
Anion gap: 9 (ref 5–15)
BUN: 19 mg/dL (ref 8–23)
CO2: 21 mmol/L — ABNORMAL LOW (ref 22–32)
Calcium: 9 mg/dL (ref 8.9–10.3)
Chloride: 108 mmol/L (ref 98–111)
Creatinine, Ser: 1.19 mg/dL — ABNORMAL HIGH (ref 0.44–1.00)
GFR calc Af Amer: 45 mL/min — ABNORMAL LOW (ref >60)
GFR calc non Af Amer: 39 mL/min — ABNORMAL LOW (ref >60)
Glucose, Bld: 102 mg/dL — ABNORMAL HIGH (ref 70–99)
Potassium: 4.1 mmol/L (ref 3.5–5.1)
Sodium: 138 mmol/L (ref 135–145)

## 2020-01-06 LAB — CBC WITH DIFFERENTIAL/PLATELET
Abs Immature Granulocytes: 0.02 10*3/uL (ref 0.00–0.07)
Basophils Absolute: 0.1 10*3/uL (ref 0.0–0.1)
Basophils Relative: 1 %
Eosinophils Absolute: 0.3 10*3/uL (ref 0.0–0.5)
Eosinophils Relative: 3 %
HCT: 34 % — ABNORMAL LOW (ref 36.0–46.0)
Hemoglobin: 11.2 g/dL — ABNORMAL LOW (ref 12.0–15.0)
Immature Granulocytes: 0 %
Lymphocytes Relative: 16 %
Lymphs Abs: 1.4 10*3/uL (ref 0.7–4.0)
MCH: 32.1 pg (ref 26.0–34.0)
MCHC: 32.9 g/dL (ref 30.0–36.0)
MCV: 97.4 fL (ref 80.0–100.0)
Monocytes Absolute: 0.9 10*3/uL (ref 0.1–1.0)
Monocytes Relative: 10 %
Neutro Abs: 6.1 10*3/uL (ref 1.7–7.7)
Neutrophils Relative %: 70 %
Platelets: 312 10*3/uL (ref 150–400)
RBC: 3.49 MIL/uL — ABNORMAL LOW (ref 3.87–5.11)
RDW: 13.6 % (ref 11.5–15.5)
WBC: 8.7 10*3/uL (ref 4.0–10.5)
nRBC: 0 % (ref 0.0–0.2)

## 2020-01-06 MED ORDER — APIXABAN 5 MG PO TABS
5.0000 mg | ORAL_TABLET | Freq: Two times a day (BID) | ORAL | Status: DC
  Administered 2020-01-06 – 2020-01-07 (×2): 5 mg via ORAL
  Filled 2020-01-06 (×2): qty 1

## 2020-01-06 MED ORDER — AMLODIPINE BESYLATE 5 MG PO TABS: 5.0000 mg | ORAL_TABLET | Freq: Every day | ORAL | Status: DC

## 2020-01-06 MED ORDER — APIXABAN 5 MG PO TABS: 5.0000 mg | ORAL_TABLET | Freq: Two times a day (BID) | ORAL | Status: DC

## 2020-01-06 MED ORDER — APIXABAN 2.5 MG PO TABS
2.5000 mg | ORAL_TABLET | Freq: Two times a day (BID) | ORAL | Status: DC
  Administered 2020-01-06: 2.5 mg via ORAL
  Filled 2020-01-06: qty 1

## 2020-01-06 NOTE — Evaluation (Signed)
Physical Therapy Evaluation Patient Details Name: Cindy [name] MRN: [id number] DOB: 08-10-1922 Today's Date: 01/06/2020   History of Present Illness  The patient is a 86 yr old woman who was hospitalized in the first week of August with a saddle embolus with right heart strain. She was discharged on Eliquis with which she has been compliant. Last night upon returning from the bathroom the patient fell against the wooden foot board of her bed.  CT of the chest demonstrated rib fractures and left subcapsular renal hematoma  Clinical Impression   Pt admitted with above diagnosis. Comes from home where she lives in a single level house with a few stairs to enter from the garage, and ramped entrance in front; Reports she walks with a RW, and has a caregiver, [name], who assists during waking hours, and can stay 24 hours if needed; Presents to PT with genralized weakness, decr activity tolerance, dependencies in functional mobility;  Pt currently with functional limitations due to the deficits listed below (see PT Problem List). Pt will benefit from skilled PT to increase their independence and safety with mobility to allow discharge to the venue listed below.    Orthostatic BPs with notable drop in SBP in standing with concurrent request to sit; I'm curious about getting a standing BP at 3 minutes to see if her system can respond and regulate BPs with postural changes; Discussed with her Nurse.    01/06/20 0904  Vital Signs  Patient Position (if appropriate) Orthostatic Vitals  Orthostatic Lying   BP- Lying 147/85  Pulse- Lying 68  Orthostatic Sitting  BP- Sitting 145/75  Pulse- Sitting 70  Orthostatic Standing at 0 minutes  BP- Standing at 0 minutes 112/79  Pulse- Standing at 0 minutes 80 (Requesting to sit)  Orthostatic Standing at 3 minutes  BP- Standing at 3 minutes  (Did not stand long enough to obtain)        Follow Up Recommendations Home health PT;Supervision/Assistance - 24 hour  (Consider HHOT as well)    Equipment Recommendations  3in1 (PT)    Recommendations for Other Services       Precautions / Restrictions Precautions Precautions: Fall      Mobility  Bed Mobility Overal bed mobility: Needs Assistance Bed Mobility: Supine to Sit     Supine to sit: Min assist     General bed mobility comments: Min handheld assist to pull to sit  Transfers Overall transfer level: Needs assistance Equipment used: Rolling walker (2 wheeled) Transfers: Sit to/from Stand Sit to Stand: Min guard         General transfer comment: Close guard for safety; tends to pull up on RW; cues for hand placement and safety; did not need physical assist  Ambulation/Gait Ambulation/Gait assistance: Min guard Gait Distance (Feet): 4 Feet Assistive device: Rolling walker (2 wheeled)       General Gait Details: Pivotal steps bed to recliner; cues to self-monitor for activity tolerance  Stairs            Wheelchair Mobility    Modified Rankin (Stroke Patients Only)       Balance Overall balance assessment: Needs assistance Sitting-balance support: Feet supported Sitting balance-Leahy Scale: Good       Standing balance-Leahy Scale: Fair Standing balance comment: Notable decr standing tolerance, requested to sit while getting initial standing BP  Pertinent Vitals/Pain Pain Assessment: Faces Faces Pain Scale: Hurts a little bit Pain Location: L ribs Pain Descriptors / Indicators: Sore Pain Intervention(s): Monitored during session    Home Living Family/patient expects to be discharged to:: Private residence Living Arrangements: Non-relatives/Friends Available Help at Discharge: Personal care attendant;Available 24 hours/day (CG, Roger at home from early AM to when pt goes to bed. Pt stated that [name] can stay 24/7 if needed.) Type of Home: House Home Access: Ramped entrance;Stairs to enter Entrance Stairs-Rails:  Right (and [name] helps) Technical brewer of Steps: 5 (at garage) Home Layout: One level Home Equipment: Bedside commode;Shower seat;Cane - single point;Walker - 2 wheels      Prior Function Level of Independence: Needs assistance   Gait / Transfers Assistance Needed: supervision for ambulation  ADL's / Homemaking Assistance Needed: assist for bathing/dressing as needed by caregiver. [name] also completes all IADLs including housekeeping, laundry and cooking, and provides transportation.  Pt has a medical alert bracelet when she is home alone.        Hand Dominance   Dominant Hand: Right    Extremity/Trunk Assessment   Upper Extremity Assessment Upper Extremity Assessment: Generalized weakness    Lower Extremity Assessment Lower Extremity Assessment: Generalized weakness       Communication   Communication: No difficulties  Cognition Arousal/Alertness: Awake/alert Behavior During Therapy: WFL for tasks assessed/performed Overall Cognitive Status: Impaired/Different from baseline Area of Impairment: Orientation                 Orientation Level: Disoriented to;Place;Situation (incorrectly named year, correctly named the president)             General Comments: Noted history of dementia, likely at baseline      General Comments General comments (skin integrity, edema, etc.): HR ranged 68 to 80 bpm with activity; O2 sats stabl eon Room Air; see above for discussion re: serial BPs    Exercises     Assessment/Plan    PT Assessment Patient needs continued PT services  PT Problem List Decreased strength;Decreased activity tolerance;Decreased balance;Decreased mobility;Decreased coordination;Decreased cognition;Decreased knowledge of use of DME;Decreased safety awareness;Decreased knowledge of precautions;Cardiopulmonary status limiting activity;Pain       PT Treatment Interventions DME instruction;Gait training;Stair training;Functional mobility  training;Therapeutic activities;Therapeutic exercise;Balance training;Patient/family education    PT Goals (Current goals can be found in the Care Plan section)  Acute Rehab PT Goals Patient Stated Goal: Did not state, but agreeable to getting OOB PT Goal Formulation: With patient Time For Goal Achievement: 01/20/20 Potential to Achieve Goals: Good    Frequency Min 3X/week   Barriers to discharge Other (comment) Request that [name], her caregiver, stay 24 hours initally at dc     Co-evaluation               AM-PAC PT "6 Clicks" Mobility  Outcome Measure Help needed turning from your back to your side while in a flat bed without using bedrails?: A Little Help needed moving from lying on your back to sitting on the side of a flat bed without using bedrails?: A Little Help needed moving to and from a bed to a chair (including a wheelchair)?: A Little Help needed standing up from a chair using your arms (e.g., wheelchair or bedside chair)?: A Little Help needed to walk in hospital room?: A Little Help needed climbing 3-5 steps with a railing? : A Little 6 Click Score: 18    End of Session Equipment Utilized During Treatment: Gait belt Activity Tolerance:  Patient tolerated treatment well;Other (comment) (though I'm curious re: BP at 3 min standing) Patient left: in chair;with call bell/phone within reach;with chair alarm set Nurse Communication: Mobility status PT Visit Diagnosis: Unsteadiness on feet (R26.81);Other abnormalities of gait and mobility (R26.89);History of falling (Z91.81)    Time: 3794-4461 PT Time Calculation (min) (ACUTE ONLY): 27 min   Charges:   PT Evaluation $PT Eval Moderate Complexity: 1 Mod PT Treatments $Therapeutic Activity: 8-22 mins        Roney Marion, PT  Acute Rehabilitation Services Pager [phone] Office [phone]   [place] 01/06/2020, 9:20 AM

## 2020-01-06 NOTE — TOC Initial Note (Addendum)
Transition of Care [facility]) - Initial/Assessment Note    Patient Details  Name: Cindy [name] MRN: [id number] Date of Birth: 24-Jul-1922  Transition of Care [facility]) CM/SW Contact:    Carles Collet, RN Phone Number: 01/06/2020, 11:54 AM  Clinical Narrative:       Could not reach son, LVM. Spoke w caretaker [name]. He states that he provides care from 8am to 8:30/9:00 at night Monday through Sunday. Patient has an emergency alert bracelet as well. She recently finished services with [facility]. [name] would like those reinstated. He confirms that patient has all needed DME at home. [name] states that he will provide transportation home at [place] notified [employer]. Will need HH PT OT RN resumption orders.    [facility] (Caregiver) Other   [phone]  Darlina Guys   [phone]               Expected Discharge Plan: Home w Home Health Services Barriers to Discharge: Continued Medical Work up   Patient Goals and CMS Choice Patient states their goals for this hospitalization and ongoing recovery are:: to go home CMS Medicare.gov Compare Post Acute Care list provided to:: Other (Comment Required)    Expected Discharge Plan and Services Expected Discharge Plan: [place] Choice: Home Health                             HH Arranged: PT, OT, RN [facility] Agency: [facility] Date [place]: 01/06/20 Time HH Agency Contacted: 42 Representative spoke with at [place]: [place]  Prior Living Arrangements/Services   Lives with:: Self                   Activities of Daily Living      Permission Sought/Granted                  Emotional Assessment              Admission diagnosis:  Hemothorax on left [J94.2] Hemorrhage of right kidney [N28.89] Renal hemorrhage, left [N28.89] Hematoma of left kidney, initial encounter [S37.012A] Fall in home, initial encounter [W19.XXXA, Y92.009] Closed  fracture of multiple ribs of left side, initial encounter [S22.42XA] Patient Active Problem List   Diagnosis Date Noted  . Hemorrhage of right kidney 01/04/2020  . Renal hemorrhage, left 01/04/2020  . Rib fractures 01/04/2020  . Pulmonary emboli ([place]) 11/17/2019  . Palliative care by specialist   . Goals of care, counseling/discussion   . General weakness   . Pulmonary embolism ([place]) 11/16/2019  . Dementia ([place]) 11/16/2019  . Hypothyroidism 11/16/2019  . Pancreatic mass 11/16/2019  . NSTEMI (non-ST elevated myocardial infarction) ([place]) 03/05/2019  . Pain due to onychomycosis of toenails of both feet 10/03/2018  . H/O total shoulder replacement, left 08/04/2018  . Pneumonia 06/13/2018  . Pressure injury of skin 06/13/2018  . Abdominal pain 11/08/2016  . Acute lower UTI 11/08/2016  . Acute cystitis without hematuria   . SBO (small bowel obstruction) ([place]) 06/28/2016  . Constipation, chronic 06/28/2016  . Acute distention of stomach 06/28/2016  . Eventration of left crus of diaphragm 06/27/2016  . Uncontrolled hypertension 06/27/2016  . Gastric outlet obstruction 06/27/2016  . CRI (chronic renal insufficiency), stage 4 (severe) ([place]) 06/14/2016  . Primary hypothyroidism 06/14/2016  . Osteoporosis, postmenopausal 06/14/2016  . OAB (overactive bladder) 02/25/2016  . Left bundle branch block  12/14/2012  . Essential hypertension 12/14/2012  . Hyperlipidemia 12/14/2012  . Pain in shoulder 11/11/2011  . Melanoma of left upper arm ([place]) 10/19/2011  . Lung mass 06/16/2011  . Acquired deformities of toe 06/14/2011  . Personal history of other diseases of the digestive system 06/07/2011  . GERD (gastroesophageal reflux disease) 06/07/2011   PCP:  Lorne Skeens, MD Pharmacy:   [facility] [phone] - [address], [address] AT [facility] [address] [place] [address] Phone: [phone] Fax: [phone]     Social Determinants of  Health ([place]) Interventions    Readmission Risk Interventions No flowsheet data found.

## 2020-01-06 NOTE — Progress Notes (Signed)
PROGRESS NOTE  Cindy [name]  DOB: 1922/06/06  PCP: Lorne Skeens, MD QAS:[id number]  DOA: 01/04/2020  LOS: 2 days   Chief Complaint  Patient presents with  . Fall   Brief narrative: Cindy [name] is a 86 y.o. female with PMH of HTN, HLD, CAD, CKD 3, GERD, hypothyroidism, osteoporosis, arthritis who was hospitalized in August with a saddle pulmonary embolism and discharged on Eliquis.  Patient presented to the ED on 01/04/2020 after a fall at home.   Patient lives at home, has a caregiver during the day.  I spoke with her caregiver Mr. Maurice March to clarify the history.  On the morning of 9/23 while returning from the bathroom, she lost her balance and fell against a tall wooden footboard of the bed hitting the left side of her chest.  This was witnessed by her caregiver who states she did not fall to the floor.  She did not have any premonition symptoms of lightheadedness, chest pain or dizziness. She had minimal pain only at that time.  Over the next 24 hours, pain started to worsen and hence she was brought to the ED on 9/24.  In the ED, her blood pressure was elevated upto 200, otherwise hemodynamically stable. CT of the chest showed minimally displaced fractures of the 6th-11th ribs on the left as well as age-indeterminate fractures of the 1st-5th ribs on the same side.  It also showed left subcapsular renal hematoma.  Trauma surgery was consulted by EDP. They recommended reversal of the Eliquis and hence the patient Kcentra.   Patient was admitted to hospitalist service.    Subjective: Patient was seen and examined this morning. Sitting up in chair.  Not in distress.  Patient has dementia and has short-term memory loss. This morning, on evaluation by physical therapy, she was noted to have orthostatic blood pressure drop 145/75 to 112/79 on standing up.  Assessment/Plan: Left sided rib fractures -Acute fracture of the 6th-11th ribs and age-indeterminate fractures of 1st-5th  ribs. -Patient may have had a fall previously as well. -Pain control with lidocaine patch, oral pain medicines. -Encourage use of incentive spirometry  Left kidney subcapsular hematoma -Hemoglobin stable. -Eliquis was kept on hold.  Okay to resume per trauma surgery.  Fall due to loss of balance -PT eval obtained.  Home health PT recommended.  Recent history of saddle pulmonary embolus with right heart strain on 11/16/2019 -Was taking Eliquis at home. -Eliquis was kept on hold.  Kcentra had to be given because of fall.  Okay to resume Eliquis today per trauma surgery.    Hypertensive urgency Orthostatic hypotension -Blood pressure on arrival in next several hours remained elevated over 180s to up to 220s -Patient's caregiver reported that she used to be on blood pressure medicines in the past but they gradually stopped and her blood pressure at home usually is in 120s and 130s.  -On admission, she was started on antihypertensives.  Blood pressure is better but she is having orthostatic hypotension.  Stop antihypertensive.  Monitor for another 24 hours.  Repeat orthostatic vital signs tomorrow.    Hypothyroidism -Continue Synthroid  Hyperlipidemia -Continue simvastatin   Dementia -Supportive care  Mobility: PT eval Code Status:   Code Status: DNR  Nutritional status: Body mass index is 20.43 kg/m.     Diet Order            Diet regular Room service appropriate? Yes; Fluid consistency: Thin  Diet effective now  DVT prophylaxis: Place and maintain sequential compression device Start: 01/05/20 1126SCDs   Antimicrobials:  None Fluid: None Consultants: Trauma surgery Family Communication:  Discussed with caregiver on the phone  Status is: Inpatient  Remains inpatient appropriate because -monitor for next 24 hours on anticoagulation.  Also has orthostatic hypotension.  Dispo: The patient is from: Home              Anticipated d/c is to: Home               Anticipated d/c date is: 2 days              Patient currently is not medically stable to d/c.    Infusions:    Scheduled Meds: . hydroxypropyl methylcellulose / hypromellose  1 drop Both Eyes Daily  . levothyroxine  75 mcg Oral QAC breakfast  . lidocaine  1 patch Transdermal Q24H  . mirabegron ER  50 mg Oral Daily  . pantoprazole  40 mg Oral Daily  . polyethylene glycol  17 g Oral Daily  . triamcinolone  1 application Topical QODAY    Antimicrobials: Anti-infectives (From admission, onward)   None      PRN meds: acetaminophen **OR** acetaminophen, albuterol, bisacodyl, hydrALAZINE, ondansetron **OR** ondansetron (ZOFRAN) IV, polyethylene glycol, traMADol   Objective: Vitals:   01/06/20 0403 01/06/20 0811  BP: (!) 146/88 (!) 161/78  Pulse: 65 68  Resp: 17 (!) 23  Temp: 98.2 F (36.8 C) (!) 97.5 F (36.4 C)  SpO2: 97%    No intake or output data in the 24 hours ending 01/06/20 1053 Filed Weights   01/04/20 2022  Weight: 47.4 kg   Weight change:  Body mass index is 20.43 kg/m.   Physical Exam: General exam: Appears calm and comfortable.  Not in distress Skin: No rashes, lesions or ulcers. HEENT: Atraumatic, normocephalic, supple neck, no obvious bleeding Lungs: Clear to auscultation bilaterally.  Minimal tenderness on left chest wall. CVS: Regular rate and rhythm, no murmur GI/Abd soft, nontender, nondistended, bowel sound present CNS: Alert, awake, oriented to place and person which is her baseline Psychiatry: Mood appropriate Extremities: No pedal edema, no calf tenderness  Data Review: I have personally reviewed the laboratory data and studies available.  Recent Labs  Lab 01/04/20 1506 01/05/20 0525 01/06/20 0240  WBC 10.2 9.5 8.7  NEUTROABS 6.6  --  6.1  HGB 11.7* 11.7* 11.2*  HCT 35.1* 35.4* 34.0*  MCV 99.7 99.4 97.4  PLT 304 288 312   Recent Labs  Lab 01/04/20 1506 01/05/20 0525 01/06/20 0240  NA 139 142 138  K 4.3 4.3 4.1  CL  105 108 108  CO2 25 22 21*  GLUCOSE 97 83 102*  BUN 27* 20 19  CREATININE 1.36* 1.01* 1.19*  CALCIUM 9.2 9.1 9.0    F/u labs ordered  Signed, Terrilee Croak, MD [facility] 01/06/2020

## 2020-01-06 NOTE — Progress Notes (Addendum)
Subjective/Chief Complaint: No complaints Pleasantly mildly confused Not requiring any pain medication Hgb stable  Objective: Vital signs in last 24 hours: Temp:  [97.5 F (36.4 C)-98.2 F (36.8 C)] 97.5 F (36.4 C) (09/26 0811) Pulse Rate:  [65-73] 68 (09/26 0811) Resp:  [16-24] 23 (09/26 0811) BP: (136-164)/(69-88) 161/78 (09/26 0811) SpO2:  [92 %-97 %] 97 % (09/26 0403) Last BM Date: 01/05/20  Intake/Output from previous day: No intake/output data recorded. Intake/Output this shift: No intake/output data recorded.  Elderly female in NAD Awake, alert, conversant Minimal L chest wall tenderness Lungs - CTA B  Lab Results:  Recent Labs    01/05/20 0525 01/06/20 0240  WBC 9.5 8.7  HGB 11.7* 11.2*  HCT 35.4* 34.0*  PLT 288 312   BMET Recent Labs    01/05/20 0525 01/06/20 0240  NA 142 138  K 4.3 4.1  CL 108 108  CO2 22 21*  GLUCOSE 83 102*  BUN 20 19  CREATININE 1.01* 1.19*  CALCIUM 9.1 9.0   PT/INR No results for input(s): LABPROT, INR in the last 72 hours. ABG No results for input(s): PHART, HCO3 in the last 72 hours.  Invalid input(s): PCO2, PO2  Studies/Results: DG Ribs Unilateral W/Chest Left  Result Date: 01/04/2020 CLINICAL DATA:  Fall.  Left rib pain. EXAM: LEFT RIBS AND CHEST - 3+ VIEW COMPARISON:  03/05/2019 FINDINGS: Elevation of the left hemidiaphragm, stable. Left base atelectasis. No effusion or pneumothorax. Heart is normal size. No visible displaced rib fracture. No visible displaced rib fracture. Prior left shoulder replacement. Chronic elevation of the left hemidiaphragm.  Left base atelectasis. IMPRESSION: Negative. Electronically Signed   By: Rolm Baptise M.D.   On: 01/04/2020 16:08   CT Chest W Contrast  Result Date: 01/04/2020 CLINICAL DATA:  Abdominal trauma, loss of balance and fell into bed last night with left rib pain radiating subjacent to the left breast EXAM: CT CHEST, ABDOMEN, AND PELVIS WITH CONTRAST TECHNIQUE:  Multidetector CT imaging of the chest, abdomen and pelvis was performed following the standard protocol during bolus administration of intravenous contrast. CONTRAST:  50mL OMNIPAQUE IOHEXOL 300 MG/ML  SOLN COMPARISON:  CT 11/16/2019 FINDINGS: CT CHEST FINDINGS Cardiovascular: Calcifications of the aortic leaflets. Atherosclerotic plaque within the normal caliber aorta. No acute luminal abnormality of the imaged aorta. No periaortic stranding or hemorrhage. Normal 3 vessel branching of the aortic arch. Proximal great vessels are calcified but free of acute abnormality. Evaluation is not tailored for the evaluation of the pulmonary arteries with further limitation due to extensive motion artifact. There has been interval resolution of the larger pulmonary embolic burden within the right main pulmonary artery. No new large emboli are identified. Mediastinum/Nodes: No mediastinal fluid or gas. No worrisome thyroid lesions. No acute abnormality of the trachea or esophagus. No worrisome mediastinal, hilar or axillary adenopathy. Lungs/Pleura: Interlobular septal thickening, redistribution of the pulmonary vascularity and some mild central cuffing is suggestive of interstitial pulmonary edema. There is a trace left pleural effusion. 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 No pneumothorax. No direct visible injury of the lung parenchyma is evident. Stable appearance of a 14 mm right perihilar nodule. Musculoskeletal: Minimally displaced fracture left lateral-anterolateral sixth through eleventh ribs are noted. Associated thickening of the intercostalis musculature with some contusive changes along the left chest wall and flank. There are additional more age indeterminate left rib fractures involving the  first through fifth ribs anteriorly as well. Prior left reverse shoulder arthroplasty. No  evidence of acute hardware complication. Prior distal right clavicular resection. 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 No acute vertebral body fracture or height loss. Unchanged mild stepwise anterolisthesis T2-T4. CT ABDOMEN PELVIS FINDINGS Hepatobiliary: Diffuse hepatic hypoattenuation compatible with hepatic steatosis. No worrisome focal liver lesions. Smooth liver surface contour. Few dependently layering gallstones present in the gallbladder. Normal gallbladder distention. No visible biliary ductal dilatation or intraductal gallstones. Pancreas: Atrophic appearance of the pancreas redemonstration of a cystic lesion near the pancreatic uncinate (2/57) decreased in size now measuring 1.5 x 1.0 cm, previously 2.5 x 1.6 cm. No peripancreatic inflammation or ductal dilatation. 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 No convincing acute splenic injury is identified. Adrenals/Urinary Tract: Stable lobular thickening of the adrenal glands. Intermediate attenuation, likely hemorrhage within a large cyst seen arising from the upper pole left kidney measuring up to 5.8 x 4.7 cm in size (2/55). Additional subcapsular hemorrhage seen along the posterior aspect of the left kidney as well (2/57). No discernible left renal laceration, discernible vascular injury or devascularization. No right renal injury. Few subcentimeter hypertension foci in both kidneys too small to fully characterize on CT imaging but statistically likely benign. No urolithiasis or hydronephrosis. Moderate bladder distension. No evidence of direct bladder injury. Stomach/Bowel: Much of the stomach is contained within the large diaphragmatic eventration with a degree of mesentero-axial rotation. No resulting obstruction is evident at this time. No focal small bowel thickening or dilatation. Appendix is not visualized. No focal inflammation the vicinity of the  cecum to suggest an occult appendicitis. Portion of transverse colon/splenic flexure contained within the diaphragmatic eventration as well without resulting mechanical obstruction or vascular compromise. There is some mild segmental thickening of the distal colon/sigmoid in a region of numerous colonic diverticula but without evidence of active focal diverticular inflammation at this time, could reflect a mild colitis or sequela of prior diverticulitis. Vascular/Lymphatic: No acute traumatic vascular injury within the abdomen or pelvis. No acute luminal abnormality. Atherosclerotic calcifications within the abdominal aorta and branch vessels. No aneurysm or ectasia. No enlarged abdominopelvic lymph nodes. Reproductive: Uterus is surgically absent. No concerning adnexal lesions. Other: No free fluid or free air. Contusive changes along the left flank and abdominal wall, as above. Fat containing bilateral inguinal hernias with a small amount low-attenuation fluid in the right inguinal hernia sac as well. Musculoskeletal: Small amount of hypoattenuation seen adjacent the superior aspect of left psoas (2/56), low-attenuation with could reflect a small intramuscular injury. No large intramuscular hemorrhage. Musculature is otherwise normal and symmetric with some fatty atrophy likely related to disuse/patient age. The osseous structures appear diffusely demineralized which may limit detection of small or nondisplaced fractures. Multilevel degenerative changes are present in the imaged portions of the spine. No acute vertebral body fracture or height loss. No clear fracture or traumatic malalignment of the bony pelvis. Grade 1 anterolisthesis L1 on L2 and grade 2 anterolisthesis L4 on L5 are unchanged from prior without pars defects. IMPRESSION: Traumatic 1. Minimally displaced fractures of the left lateral to anterolateral sixth through eleventh ribs with adjacent chest wall contusive changes and thickening of the  intercostal is musculature. Small left-sided effusion, likely small volume hemothorax. No pneumothorax. 2. Additional age indeterminate left rib fractures involving the first through fifth ribs anteriorly as well. 3. AAST Grade II Renal Injury - Subcapsular hematoma along the posterior  left kidney (approximately 10% surface area) as well as development of intracystic hemorrhage in a previously simple appearing cyst in the upper pole. No discernible left renal laceration, discrete vascular injury or devascularization. 4. Trace simple attenuation fluid along the superior aspect of the left psoas musculature, could suggest intramuscular injury without large intramuscular or retroperitoneal hemorrhage. Nontraumatic 1. Segmental thickening of the distal colon/sigmoid in a region of numerous colonic diverticula but without evidence of active focal diverticular inflammation at this time, could reflect a mild colitis or sequela of prior diverticulitis. 2. Interval resolution of the previously seen pulmonary embolic burden without new emboli though evaluation is significantly limited in regards to assessment of the pulmonary arteries. 3. Hepatic steatosis. 4. Cholelithiasis without evidence of acute cholecystitis. 5. Stable 14 mm right perihilar nodule since 11/16/2019 though within overall increase in size from 2013 where this measured only 11 mm. Consider outpatient pulmonology referral if not previously obtained. 6. Redemonstration of a cystic lesion near the pancreatic uncinate measuring only 1.5 cm at this time. Consider reimaging every 2 years to assess for interval change with pancreatic protocol CT or MR imaging. This recommendation follows ACR consensus guidelines: Management of Incidental Pancreatic Cysts: A White Paper of the ACR Incidental Findings Committee. J Am Coll Radiol 8588;50:277-412. 7. Aortic Atherosclerosis (ICD10-I70.0). Electronically Signed   By: Lovena Le M.D.   On: 01/04/2020 19:53   CT  ABDOMEN PELVIS W CONTRAST  Result Date: 01/04/2020 CLINICAL DATA:  Abdominal trauma, loss of balance and fell into bed last night with left rib pain radiating subjacent to the left breast EXAM: CT CHEST, ABDOMEN, AND PELVIS WITH CONTRAST TECHNIQUE: Multidetector CT imaging of the chest, abdomen and pelvis was performed following the standard protocol during bolus administration of intravenous contrast. CONTRAST:  66mL OMNIPAQUE IOHEXOL 300 MG/ML  SOLN COMPARISON:  CT 11/16/2019 FINDINGS: CT CHEST FINDINGS Cardiovascular: Calcifications of the aortic leaflets. Atherosclerotic plaque within the normal caliber aorta. No acute luminal abnormality of the imaged aorta. No periaortic stranding or hemorrhage. Normal 3 vessel branching of the aortic arch. Proximal great vessels are calcified but free of acute abnormality. Evaluation is not tailored for the evaluation of the pulmonary arteries with further limitation due to extensive motion artifact. There has been interval resolution of the larger pulmonary embolic burden within the right main pulmonary artery. No new large emboli are identified. Mediastinum/Nodes: No mediastinal fluid or gas. No worrisome thyroid lesions. No acute abnormality of the trachea or esophagus. No worrisome mediastinal, hilar or axillary adenopathy. Lungs/Pleura: Interlobular septal thickening, redistribution of the pulmonary vascularity and some mild central cuffing is suggestive of interstitial pulmonary edema. There is a trace left pleural effusion. 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 No pneumothorax. No direct visible injury of the lung parenchyma is evident. Stable appearance of a 14 mm right perihilar nodule. Musculoskeletal: Minimally displaced fracture left lateral-anterolateral sixth through eleventh ribs are noted. Associated thickening of the  intercostalis musculature with some contusive changes along the left chest wall and flank. There are additional more age indeterminate left rib fractures involving the first through fifth ribs anteriorly as well. Prior left reverse shoulder arthroplasty. No evidence of acute hardware complication. Prior distal right clavicular resection. 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 No acute vertebral body fracture or height loss. Unchanged mild stepwise anterolisthesis T2-T4. CT ABDOMEN PELVIS FINDINGS Hepatobiliary:  Diffuse hepatic hypoattenuation compatible with hepatic steatosis. No worrisome focal liver lesions. Smooth liver surface contour. Few dependently layering gallstones present in the gallbladder. Normal gallbladder distention. No visible biliary ductal dilatation or intraductal gallstones. Pancreas: Atrophic appearance of the pancreas redemonstration of a cystic lesion near the pancreatic uncinate (2/57) decreased in size now measuring 1.5 x 1.0 cm, previously 2.5 x 1.6 cm. No peripancreatic inflammation or ductal dilatation. 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 No convincing acute splenic injury is identified. Adrenals/Urinary Tract: Stable lobular thickening of the adrenal glands. Intermediate attenuation, likely hemorrhage within a large cyst seen arising from the upper pole left kidney measuring up to 5.8 x 4.7 cm in size (2/55). Additional subcapsular hemorrhage seen along the posterior aspect of the left kidney as well (2/57). No discernible left renal laceration, discernible vascular injury or devascularization. No right renal injury. Few subcentimeter hypertension foci in both kidneys too small to fully characterize on CT imaging but statistically likely benign. No urolithiasis or hydronephrosis. Moderate bladder distension. No evidence of direct bladder injury. Stomach/Bowel: Much of the stomach is  contained within the large diaphragmatic eventration with a degree of mesentero-axial rotation. No resulting obstruction is evident at this time. No focal small bowel thickening or dilatation. Appendix is not visualized. No focal inflammation the vicinity of the cecum to suggest an occult appendicitis. Portion of transverse colon/splenic flexure contained within the diaphragmatic eventration as well without resulting mechanical obstruction or vascular compromise. There is some mild segmental thickening of the distal colon/sigmoid in a region of numerous colonic diverticula but without evidence of active focal diverticular inflammation at this time, could reflect a mild colitis or sequela of prior diverticulitis. Vascular/Lymphatic: No acute traumatic vascular injury within the abdomen or pelvis. No acute luminal abnormality. Atherosclerotic calcifications within the abdominal aorta and branch vessels. No aneurysm or ectasia. No enlarged abdominopelvic lymph nodes. Reproductive: Uterus is surgically absent. No concerning adnexal lesions. Other: No free fluid or free air. Contusive changes along the left flank and abdominal wall, as above. Fat containing bilateral inguinal hernias with a small amount low-attenuation fluid in the right inguinal hernia sac as well. Musculoskeletal: Small amount of hypoattenuation seen adjacent the superior aspect of left psoas (2/56), low-attenuation with could reflect a small intramuscular injury. No large intramuscular hemorrhage. Musculature is otherwise normal and symmetric with some fatty atrophy likely related to disuse/patient age. The osseous structures appear diffusely demineralized which may limit detection of small or nondisplaced fractures. Multilevel degenerative changes are present in the imaged portions of the spine. No acute vertebral body fracture or height loss. No clear fracture or traumatic malalignment of the bony pelvis. Grade 1 anterolisthesis L1 on L2 and grade 2  anterolisthesis L4 on L5 are unchanged from prior without pars defects. IMPRESSION: Traumatic 1. Minimally displaced fractures of the left lateral to anterolateral sixth through eleventh ribs with adjacent chest wall contusive changes and thickening of the intercostal is musculature. Small left-sided effusion, likely small volume hemothorax. No pneumothorax. 2. Additional age indeterminate left rib fractures involving the first through fifth ribs anteriorly as well. 3. AAST Grade II Renal Injury - Subcapsular hematoma along the posterior left kidney (approximately 10% surface area) as well as development of intracystic hemorrhage in a previously simple appearing cyst in the upper pole. No discernible left renal laceration, discrete vascular injury or devascularization. 4. Trace simple attenuation fluid along the superior aspect of the left psoas musculature, could suggest intramuscular injury without large intramuscular or retroperitoneal  hemorrhage. Nontraumatic 1. Segmental thickening of the distal colon/sigmoid in a region of numerous colonic diverticula but without evidence of active focal diverticular inflammation at this time, could reflect a mild colitis or sequela of prior diverticulitis. 2. Interval resolution of the previously seen pulmonary embolic burden without new emboli though evaluation is significantly limited in regards to assessment of the pulmonary arteries. 3. Hepatic steatosis. 4. Cholelithiasis without evidence of acute cholecystitis. 5. Stable 14 mm right perihilar nodule since 11/16/2019 though within overall increase in size from 2013 where this measured only 11 mm. Consider outpatient pulmonology referral if not previously obtained. 6. Redemonstration of a cystic lesion near the pancreatic uncinate measuring only 1.5 cm at this time. Consider reimaging every 2 years to assess for interval change with pancreatic protocol CT or MR imaging. This recommendation follows ACR consensus  guidelines: Management of Incidental Pancreatic Cysts: A White Paper of the ACR Incidental Findings Committee. J Am Coll Radiol 5053;97:[id number]. 7. Aortic Atherosclerosis (ICD10-I70.0). Electronically Signed   By: Lovena Le M.D.   On: 01/04/2020 19:53    Anti-infectives: Anti-infectives (From admission, onward)   None      Assessment/Plan: Fall on anticoagulation Left 6-11 rib fractures with chest wall contusion Small left effusion/ hemothorax -  Grade II left renal hematoma - Hgb stable  OK to restart anticoagulation  Incentive spirometer OOB to chair  Primary team is considering discharge.  May need 24- hour care since she is prone to falls on anticoagulation.  May follow-up with PCP   LOS: 2 days    Maia Petties 01/06/2020

## 2020-01-07 DIAGNOSIS — S2242XA Multiple fractures of ribs, left side, initial encounter for closed fracture: Secondary | ICD-10-CM

## 2020-01-07 MED ORDER — LIDOCAINE 5 % EX PTCH: 1.0000 | MEDICATED_PATCH | CUTANEOUS | 0 refills | Status: DC

## 2020-01-07 MED ORDER — APIXABAN 2.5 MG PO TABS: 2.5000 mg | ORAL_TABLET | Freq: Two times a day (BID) | ORAL | 0 refills | Status: DC

## 2020-01-07 NOTE — Progress Notes (Signed)
Pt stated she wears glasses, but that her glasses was accidentally thrown out by staff here. Patient states glasses are frameless.

## 2020-01-07 NOTE — Discharge Summary (Signed)
Physician Discharge Summary  Cindy [name] YTK:[id number] DOB: Oct 08, 1922 DOA: 01/04/2020  PCP: Lorne Skeens, MD  Admit date: 01/04/2020 Discharge date: 01/07/2020  Admitted From: Home Discharge disposition: Home with home health PT   Code Status: DNR  Diet Recommendation: Cardiac diet  Discharge Diagnosis:   Principal Problem:   Rib fractures Active Problems:   Pulmonary emboli (HCC)   Hemorrhage of right kidney   Renal hemorrhage, left   Uncontrolled hypertension   CRI (chronic renal insufficiency), stage 4 (severe) (HCC)   Dementia ([place])   History of Present Illness / Brief narrative:  Cindy [name] is a 86 y.o. female with PMH of HTN, HLD, CAD, CKD 3, GERD, hypothyroidism, osteoporosis, arthritis who was hospitalized in August with a saddle pulmonary embolism and discharged on Eliquis.  Patient presented to the ED on 01/04/2020 after a fall at home.   Patient lives at home, has a caregiver during the day.  I spoke with her caregiver Mr. Maurice March to clarify the history.  On the morning of 9/23 while returning from the bathroom, she lost her balance and fell against a tall wooden footboard of the bed hitting the left side of her chest.  This was witnessed by her caregiver who states she did not fall to the floor.  She did not have any premonition symptoms of lightheadedness, chest pain or dizziness. She had minimal pain only at that time.  Over the next 24 hours, pain started to worsen and hence she was brought to the ED on 9/24.  In the ED, her blood pressure was elevated upto 200, otherwise hemodynamically stable. CT of the chest showed minimally displaced fractures of the 6th-11th ribs on the left as well as age-indeterminate fractures of the 1st-5th ribs on the same side.  It also showed left subcapsular renal hematoma.  Trauma surgery was consulted by EDP. They recommended reversal of the Eliquis and hence the patient Kcentra.   Patient was admitted to hospitalist  service.    Subjective:  Seen and examined this morning. Lying down in bed.  Not in distress.  No new symptoms.  Hospital Course:  Left sided rib fractures -Acute fracture of the 6th-11th ribs and age-indeterminate fractures of 1st-5th ribs. -Currently pain is controlled with lidocaine patch. -Seen by trauma surgery.  No need of surgical intervention. -Encourage use of incentive spirometry  Left kidney subcapsular hematoma -Hemoglobin stable. -Eliquis resumed.  Fall due to loss of balance -PT eval obtained.  Home health PT recommended.  Recent history of saddle pulmonary embolus with right heart strain on 11/16/2019 -Was taking Eliquis at home.  Eliquis resumed at adjusted dose of 2.5 mg twice daily.  Hypertensive urgency Orthostatic hypotension -Blood pressure on arrival in next several hours remained elevated over 180s to up to 220s -Patient's caregiver reported that she used to be on blood pressure medicines in the past but they gradually stopped and her blood pressure at home usually is in 120s and 130s.  -On admission, she was started on antihypertensives.  Blood pressure improved but patient started having orthostatic hypotension.  Medicine stopped.  Repeat orthostatic vital signs this morning was positive as well but without symptoms.  CKD stage IV Recent Labs    02/18/19 1743 02/18/19 1750 03/05/19 1202 03/06/19 0758 03/07/19 0519 11/16/19 1433 11/17/19 0500 01/04/20 1506 01/05/20 0525 01/06/20 0240  BUN 63* 54* 95* 89* 74* 29* 25* 27* 20 19  CREATININE 2.62* 2.60* 2.68* 2.28* 2.18* 1.04* 0.90 1.36* 1.01* 1.19*   Hypothyroidism -  Continue Synthroid  Hyperlipidemia -Continue simvastatin   Dementia -Supportive care  Wound care:    Discharge Exam:   Vitals:   01/06/20 1935 01/06/20 2300 01/07/20 0242 01/07/20 0734  BP:    (!) 149/66  Pulse:    (!) 56  Resp:    18  Temp: 97.8 F (36.6 C) 97.9 F (36.6 C) 97.9 F (36.6 C) 98 F (36.7 C)    TempSrc: Oral Oral Oral Oral  SpO2:      Weight:      Height:        Body mass index is 20.43 kg/m.  General exam: Appears calm and comfortable.  No physical distress Skin: No rashes, lesions or ulcers. HEENT: Atraumatic, normocephalic, supple neck, no obvious bleeding Lungs: Clear to auscultation bilaterally CVS: Regular rate and rhythm, no murmur GI/Abd soft, nontender, nondistended, bowel sound present CNS: Alert, awake, oriented to place.  Demented at baseline Psychiatry: Mood appropriate Extremities: No pedal edema, no calf redness  Follow ups:   Discharge Instructions    Diet - low sodium heart healthy   Complete by: As directed    Increase activity slowly   Complete by: As directed       Follow-up Information    Schedule an appointment as soon as possible for a visit  with Altheimer, Legrand Como, MD.   Specialty: Endocrinology Contact information: [place] [address] [phone]        [facility] DEPT.   Specialty: Emergency Medicine Why: If symptoms worsen Contact information: [place] [phone] [place], [place] Follow up.   Specialty: Home Health Services Why: For home health services. Contact information: [place] [id number] [phone]        Altheimer, Legrand Como, MD Follow up.   Specialty: Endocrinology Contact information: [place] [address] [phone]        Lorretta Harp, MD .   Specialties: Cardiology, Radiology Contact information: [address] [place] [address] [phone]               Recommendations for Outpatient Follow-Up:   1. Follow-up with PCP as an outpatient  Discharge Instructions:  Follow with Primary MD Altheimer, Legrand Como, MD in 7 days   Get CBC/BMP checked in next visit  within 1 week by PCP or SNF MD ( we routinely change or add medications that can affect your baseline labs and fluid status, therefore we recommend that you get the mentioned basic workup next visit with your PCP, your PCP may decide not to get them or add new tests based on their clinical decision)  On your next visit with your PCP, please Get Medicines reviewed and adjusted.  Please request your PCP  to go over all Hospital Tests and Procedure/Radiological results at the follow up, please get all Hospital records sent to your Prim MD by signing hospital release before you go home.  Activity: As tolerated with Full fall precautions use walker/cane & assistance as needed  For Heart failure patients - Check your Weight same time everyday, if you gain over 2 pounds, or you develop in leg swelling, experience more shortness of breath or chest pain, call your Primary MD immediately. Follow Cardiac Low Salt Diet and 1.5 lit/day fluid restriction.  If you have smoked or chewed Tobacco in the last 2 yrs  please stop smoking, stop any regular Alcohol  and or any Recreational drug use.  If you experience worsening of your admission symptoms, develop shortness of breath, life threatening emergency, suicidal or homicidal thoughts you must seek medical attention immediately by calling 911 or calling your MD immediately  if symptoms less severe.  You Must read complete instructions/literature along with all the possible adverse reactions/side effects for all the Medicines you take and that have been prescribed to you. Take any new Medicines after you have completely understood and accpet all the possible adverse reactions/side effects.   Do not drive, operate heavy machinery, perform activities at heights, swimming or participation in water activities or provide baby sitting services if your were admitted for syncope or siezures until you have seen by Primary MD or a Neurologist and advised to do so again.  Do  not drive when taking Pain medications.  Do not take more than prescribed Pain, Sleep and Anxiety Medications  Wear Seat belts while driving.   Please note You were cared for by a hospitalist during your hospital stay. If you have any questions about your discharge medications or the care you received while you were in the hospital after you are discharged, you can call the unit and asked to speak with the hospitalist on call if the hospitalist that took care of you is not available. Once you are discharged, your primary care physician will handle any further medical issues. Please note that NO REFILLS for any discharge medications will be authorized once you are discharged, as it is imperative that you return to your primary care physician (or establish a relationship with a primary care physician if you do not have one) for your aftercare needs so that they can reassess your need for medications and monitor your lab values.    Allergies as of 01/07/2020      Reactions   Fish Allergy Hives   Diclofenac Other (See Comments)   unknown   Ibuprofen Other (See Comments)   Affects kidney function   Sulfa Antibiotics    Unknown reaction      Medication List    TAKE these medications   acetaminophen 325 MG tablet Commonly known as: TYLENOL Take 1-2 tablets (325-650 mg total) by mouth every 6 (six) hours as needed for mild pain or moderate pain (pain score 1-3 or temp > 100.5). What changed: how much to take   apixaban 2.5 MG Tabs tablet Commonly known as: Eliquis Take 1 tablet (2.5 mg total) by mouth 2 (two) times daily. What changed:   medication strength  how much to take  how to take this  when to take this  additional instructions  Another medication with the same name was removed. Continue taking this medication, and follow the directions you see here.   B COMPLEX 100 PO Take 1 tablet by mouth daily.   CRANBERRY PO Take 1 tablet by mouth daily.     glucosamine-chondroitin 500-400 MG tablet Take 1 tablet by mouth daily.   hydrocortisone 2.5 % cream Apply 1 application topically 2 (two) times daily as needed (dry skin).   hydroxypropyl methylcellulose / hypromellose 2.5 % ophthalmic solution Commonly known as: ISOPTO TEARS / GONIOVISC Place 1 drop into both eyes daily.   levothyroxine 75 MCG tablet Commonly known as: SYNTHROID Take 75 mcg by mouth daily before breakfast.   lidocaine 5 % Commonly known as: LIDODERM Place 1 patch onto the skin daily. Remove & Discard patch within 12 hours or  as directed by MD   Myrbetriq 50 MG Tb24 tablet Generic drug: mirabegron ER Take 50 mg by mouth daily.   Ocuvite Extra Tabs Take 1 tablet by mouth daily.   omega-3 acid ethyl esters 1 g capsule Commonly known as: LOVAZA Take 1 g by mouth daily.   omeprazole 20 MG capsule Commonly known as: PRILOSEC Take 20 mg by mouth daily.   polyethylene glycol 17 g packet Commonly known as: MiraLax Take 17 g by mouth daily.   simvastatin 20 MG tablet Commonly known as: ZOCOR Take 1 tablet (20 mg total) by mouth every evening.   solifenacin 10 MG tablet Commonly known as: VESICARE Take 10 mg by mouth daily.   traMADol 50 MG tablet Commonly known as: ULTRAM Take 50 mg by mouth 2 (two) times daily as needed for moderate pain or severe pain.   triamcinolone cream 0.1 % Commonly known as: KENALOG Apply 1 application topically every other day.   Triamcinolone Acetonide 0.025 % Lotn Apply 1 application topically every other day. For scalp   TURMERIC-GINGER PO Take 1 tablet by mouth daily.   Vitamin D 125 MCG (5000 UT) Caps Take 1 tablet by mouth daily.       Time coordinating discharge: 35 minutes  The results of significant diagnostics from this hospitalization (including imaging, microbiology, ancillary and laboratory) are listed below for reference.    Procedures and Diagnostic Studies:   DG Ribs Unilateral W/Chest  Left  Result Date: 01/04/2020 CLINICAL DATA:  Fall.  Left rib pain. EXAM: LEFT RIBS AND CHEST - 3+ VIEW COMPARISON:  03/05/2019 FINDINGS: Elevation of the left hemidiaphragm, stable. Left base atelectasis. No effusion or pneumothorax. Heart is normal size. No visible displaced rib fracture. No visible displaced rib fracture. Prior left shoulder replacement. Chronic elevation of the left hemidiaphragm.  Left base atelectasis. IMPRESSION: Negative. Electronically Signed   By: Rolm Baptise M.D.   On: 01/04/2020 16:08   CT Chest W Contrast  Result Date: 01/04/2020 CLINICAL DATA:  Abdominal trauma, loss of balance and fell into bed last night with left rib pain radiating subjacent to the left breast EXAM: CT CHEST, ABDOMEN, AND PELVIS WITH CONTRAST TECHNIQUE: Multidetector CT imaging of the chest, abdomen and pelvis was performed following the standard protocol during bolus administration of intravenous contrast. CONTRAST:  29mL OMNIPAQUE IOHEXOL 300 MG/ML  SOLN COMPARISON:  CT 11/16/2019 FINDINGS: CT CHEST FINDINGS Cardiovascular: Calcifications of the aortic leaflets. Atherosclerotic plaque within the normal caliber aorta. No acute luminal abnormality of the imaged aorta. No periaortic stranding or hemorrhage. Normal 3 vessel branching of the aortic arch. Proximal great vessels are calcified but free of acute abnormality. Evaluation is not tailored for the evaluation of the pulmonary arteries with further limitation due to extensive motion artifact. There has been interval resolution of the larger pulmonary embolic burden within the right main pulmonary artery. No new large emboli are identified. Mediastinum/Nodes: No mediastinal fluid or gas. No worrisome thyroid lesions. No acute abnormality of the trachea or esophagus. No worrisome mediastinal, hilar or axillary adenopathy. Lungs/Pleura: Interlobular septal thickening, redistribution of the pulmonary vascularity and some mild central cuffing is suggestive of  interstitial pulmonary edema. There is a trace left pleural effusion. 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 No pneumothorax. No direct visible injury of the lung parenchyma is evident. Stable appearance of a 14 mm right perihilar nodule. Musculoskeletal: Minimally  displaced fracture left lateral-anterolateral sixth through eleventh ribs are noted. Associated thickening of the intercostalis musculature with some contusive changes along the left chest wall and flank. There are additional more age indeterminate left rib fractures involving the first through fifth ribs anteriorly as well. Prior left reverse shoulder arthroplasty. No evidence of acute hardware complication. Prior distal right clavicular resection. 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 No acute vertebral body fracture or height loss. Unchanged mild stepwise anterolisthesis T2-T4. CT ABDOMEN PELVIS FINDINGS Hepatobiliary: Diffuse hepatic hypoattenuation compatible with hepatic steatosis. No worrisome focal liver lesions. Smooth liver surface contour. Few dependently layering gallstones present in the gallbladder. Normal gallbladder distention. No visible biliary ductal dilatation or intraductal gallstones. Pancreas: Atrophic appearance of the pancreas redemonstration of a cystic lesion near the pancreatic uncinate (2/57) decreased in size now measuring 1.5 x 1.0 cm, previously 2.5 x 1.6 cm. No peripancreatic inflammation or ductal dilatation. 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 No convincing acute splenic injury is identified. Adrenals/Urinary Tract: Stable lobular thickening of the adrenal glands. Intermediate attenuation, likely hemorrhage within a large cyst seen arising from the upper pole left kidney measuring up to 5.8 x 4.7  cm in size (2/55). Additional subcapsular hemorrhage seen along the posterior aspect of the left kidney as well (2/57). No discernible left renal laceration, discernible vascular injury or devascularization. No right renal injury. Few subcentimeter hypertension foci in both kidneys too small to fully characterize on CT imaging but statistically likely benign. No urolithiasis or hydronephrosis. Moderate bladder distension. No evidence of direct bladder injury. Stomach/Bowel: Much of the stomach is contained within the large diaphragmatic eventration with a degree of mesentero-axial rotation. No resulting obstruction is evident at this time. No focal small bowel thickening or dilatation. Appendix is not visualized. No focal inflammation the vicinity of the cecum to suggest an occult appendicitis. Portion of transverse colon/splenic flexure contained within the diaphragmatic eventration as well without resulting mechanical obstruction or vascular compromise. There is some mild segmental thickening of the distal colon/sigmoid in a region of numerous colonic diverticula but without evidence of active focal diverticular inflammation at this time, could reflect a mild colitis or sequela of prior diverticulitis. Vascular/Lymphatic: No acute traumatic vascular injury within the abdomen or pelvis. No acute luminal abnormality. Atherosclerotic calcifications within the abdominal aorta and branch vessels. No aneurysm or ectasia. No enlarged abdominopelvic lymph nodes. Reproductive: Uterus is surgically absent. No concerning adnexal lesions. Other: No free fluid or free air. Contusive changes along the left flank and abdominal wall, as above. Fat containing bilateral inguinal hernias with a small amount low-attenuation fluid in the right inguinal hernia sac as well. Musculoskeletal: Small amount of hypoattenuation seen adjacent the superior aspect of left psoas (2/56), low-attenuation with could reflect a small intramuscular  injury. No large intramuscular hemorrhage. Musculature is otherwise normal and symmetric with some fatty atrophy likely related to disuse/patient age. The osseous structures appear diffusely demineralized which may limit detection of small or nondisplaced fractures. Multilevel degenerative changes are present in the imaged portions of the spine. No acute vertebral body fracture or height loss. No clear fracture or traumatic malalignment of the bony pelvis. Grade 1 anterolisthesis L1 on L2 and grade 2 anterolisthesis L4 on L5 are unchanged from prior without pars defects. IMPRESSION: Traumatic 1. Minimally displaced fractures of the left lateral to anterolateral sixth through eleventh ribs with adjacent chest wall contusive changes and thickening of the intercostal is musculature.  Small left-sided effusion, likely small volume hemothorax. No pneumothorax. 2. Additional age indeterminate left rib fractures involving the first through fifth ribs anteriorly as well. 3. AAST Grade II Renal Injury - Subcapsular hematoma along the posterior left kidney (approximately 10% surface area) as well as development of intracystic hemorrhage in a previously simple appearing cyst in the upper pole. No discernible left renal laceration, discrete vascular injury or devascularization. 4. Trace simple attenuation fluid along the superior aspect of the left psoas musculature, could suggest intramuscular injury without large intramuscular or retroperitoneal hemorrhage. Nontraumatic 1. Segmental thickening of the distal colon/sigmoid in a region of numerous colonic diverticula but without evidence of active focal diverticular inflammation at this time, could reflect a mild colitis or sequela of prior diverticulitis. 2. Interval resolution of the previously seen pulmonary embolic burden without new emboli though evaluation is significantly limited in regards to assessment of the pulmonary arteries. 3. Hepatic steatosis. 4. Cholelithiasis  without evidence of acute cholecystitis. 5. Stable 14 mm right perihilar nodule since 11/16/2019 though within overall increase in size from 2013 where this measured only 11 mm. Consider outpatient pulmonology referral if not previously obtained. 6. Redemonstration of a cystic lesion near the pancreatic uncinate measuring only 1.5 cm at this time. Consider reimaging every 2 years to assess for interval change with pancreatic protocol CT or MR imaging. This recommendation follows ACR consensus guidelines: Management of Incidental Pancreatic Cysts: A White Paper of the ACR Incidental Findings Committee. J Am Coll Radiol 1245;80:998-338. 7. Aortic Atherosclerosis (ICD10-I70.0). Electronically Signed   By: Lovena Le M.D.   On: 01/04/2020 19:53   CT ABDOMEN PELVIS W CONTRAST  Result Date: 01/04/2020 CLINICAL DATA:  Abdominal trauma, loss of balance and fell into bed last night with left rib pain radiating subjacent to the left breast EXAM: CT CHEST, ABDOMEN, AND PELVIS WITH CONTRAST TECHNIQUE: Multidetector CT imaging of the chest, abdomen and pelvis was performed following the standard protocol during bolus administration of intravenous contrast. CONTRAST:  22mL OMNIPAQUE IOHEXOL 300 MG/ML  SOLN COMPARISON:  CT 11/16/2019 FINDINGS: CT CHEST FINDINGS Cardiovascular: Calcifications of the aortic leaflets. Atherosclerotic plaque within the normal caliber aorta. No acute luminal abnormality of the imaged aorta. No periaortic stranding or hemorrhage. Normal 3 vessel branching of the aortic arch. Proximal great vessels are calcified but free of acute abnormality. Evaluation is not tailored for the evaluation of the pulmonary arteries with further limitation due to extensive motion artifact. There has been interval resolution of the larger pulmonary embolic burden within the right main pulmonary artery. No new large emboli are identified. Mediastinum/Nodes: No mediastinal fluid or gas. No worrisome thyroid lesions. No  acute abnormality of the trachea or esophagus. No worrisome mediastinal, hilar or axillary adenopathy. Lungs/Pleura: Interlobular septal thickening, redistribution of the pulmonary vascularity and some mild central cuffing is suggestive of interstitial pulmonary edema. There is a trace left pleural effusion. 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 No pneumothorax. No direct visible injury of the lung parenchyma is evident. Stable appearance of a 14 mm right perihilar nodule. Musculoskeletal: Minimally displaced fracture left lateral-anterolateral sixth through eleventh ribs are noted. Associated thickening of the intercostalis musculature with some contusive changes along the left chest wall and flank. There are additional more age indeterminate left rib fractures involving the first through fifth ribs anteriorly as well. Prior left reverse shoulder arthroplasty. No evidence of acute hardware complication.  Prior distal right clavicular resection. 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 No acute vertebral body fracture or height loss. Unchanged mild stepwise anterolisthesis T2-T4. CT ABDOMEN PELVIS FINDINGS Hepatobiliary: Diffuse hepatic hypoattenuation compatible with hepatic steatosis. No worrisome focal liver lesions. Smooth liver surface contour. Few dependently layering gallstones present in the gallbladder. Normal gallbladder distention. No visible biliary ductal dilatation or intraductal gallstones. Pancreas: Atrophic appearance of the pancreas redemonstration of a cystic lesion near the pancreatic uncinate (2/57) decreased in size now measuring 1.5 x 1.0 cm, previously 2.5 x 1.6 cm. No peripancreatic inflammation or ductal dilatation. 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 No convincing  acute splenic injury is identified. Adrenals/Urinary Tract: Stable lobular thickening of the adrenal glands. Intermediate attenuation, likely hemorrhage within a large cyst seen arising from the upper pole left kidney measuring up to 5.8 x 4.7 cm in size (2/55). Additional subcapsular hemorrhage seen along the posterior aspect of the left kidney as well (2/57). No discernible left renal laceration, discernible vascular injury or devascularization. No right renal injury. Few subcentimeter hypertension foci in both kidneys too small to fully characterize on CT imaging but statistically likely benign. No urolithiasis or hydronephrosis. Moderate bladder distension. No evidence of direct bladder injury. Stomach/Bowel: Much of the stomach is contained within the large diaphragmatic eventration with a degree of mesentero-axial rotation. No resulting obstruction is evident at this time. No focal small bowel thickening or dilatation. Appendix is not visualized. No focal inflammation the vicinity of the cecum to suggest an occult appendicitis. Portion of transverse colon/splenic flexure contained within the diaphragmatic eventration as well without resulting mechanical obstruction or vascular compromise. There is some mild segmental thickening of the distal colon/sigmoid in a region of numerous colonic diverticula but without evidence of active focal diverticular inflammation at this time, could reflect a mild colitis or sequela of prior diverticulitis. Vascular/Lymphatic: No acute traumatic vascular injury within the abdomen or pelvis. No acute luminal abnormality. Atherosclerotic calcifications within the abdominal aorta and branch vessels. No aneurysm or ectasia. No enlarged abdominopelvic lymph nodes. Reproductive: Uterus is surgically absent. No concerning adnexal lesions. Other: No free fluid or free air. Contusive changes along the left flank and abdominal wall, as above. Fat containing bilateral inguinal hernias with  a small amount low-attenuation fluid in the right inguinal hernia sac as well. Musculoskeletal: Small amount of hypoattenuation seen adjacent the superior aspect of left psoas (2/56), low-attenuation with could reflect a small intramuscular injury. No large intramuscular hemorrhage. Musculature is otherwise normal and symmetric with some fatty atrophy likely related to disuse/patient age. The osseous structures appear diffusely demineralized which may limit detection of small or nondisplaced fractures. Multilevel degenerative changes are present in the imaged portions of the spine. No acute vertebral body fracture or height loss. No clear fracture or traumatic malalignment of the bony pelvis. Grade 1 anterolisthesis L1 on L2 and grade 2 anterolisthesis L4 on L5 are unchanged from prior without pars defects. IMPRESSION: Traumatic 1. Minimally displaced fractures of the left lateral to anterolateral sixth through eleventh ribs with adjacent chest wall contusive changes and thickening of the intercostal is musculature. Small left-sided effusion, likely small volume hemothorax. No pneumothorax. 2. Additional age indeterminate left rib fractures involving the first through fifth ribs anteriorly as well. 3. AAST Grade II Renal Injury - Subcapsular hematoma along the posterior left kidney (approximately 10% surface area) as well as development of intracystic hemorrhage in a previously simple appearing cyst  in the upper pole. No discernible left renal laceration, discrete vascular injury or devascularization. 4. Trace simple attenuation fluid along the superior aspect of the left psoas musculature, could suggest intramuscular injury without large intramuscular or retroperitoneal hemorrhage. Nontraumatic 1. Segmental thickening of the distal colon/sigmoid in a region of numerous colonic diverticula but without evidence of active focal diverticular inflammation at this time, could reflect a mild colitis or sequela of prior  diverticulitis. 2. Interval resolution of the previously seen pulmonary embolic burden without new emboli though evaluation is significantly limited in regards to assessment of the pulmonary arteries. 3. Hepatic steatosis. 4. Cholelithiasis without evidence of acute cholecystitis. 5. Stable 14 mm right perihilar nodule since 11/16/2019 though within overall increase in size from 2013 where this measured only 11 mm. Consider outpatient pulmonology referral if not previously obtained. 6. Redemonstration of a cystic lesion near the pancreatic uncinate measuring only 1.5 cm at this time. Consider reimaging every 2 years to assess for interval change with pancreatic protocol CT or MR imaging. This recommendation follows ACR consensus guidelines: Management of Incidental Pancreatic Cysts: A White Paper of the ACR Incidental Findings Committee. J Am Coll Radiol 8242;35:361-443. 7. Aortic Atherosclerosis (ICD10-I70.0). Electronically Signed   By: Lovena Le M.D.   On: 01/04/2020 19:53     Labs:   Basic Metabolic Panel: Recent Labs  Lab 01/04/20 1506 01/04/20 1506 01/05/20 0525 01/06/20 0240  NA 139  --  142 138  K 4.3   < > 4.3 4.1  CL 105  --  108 108  CO2 25  --  22 21*  GLUCOSE 97  --  83 102*  BUN 27*  --  20 19  CREATININE 1.36*  --  1.01* 1.19*  CALCIUM 9.2  --  9.1 9.0   < > = values in this interval not displayed.   GFR Estimated Creatinine Clearance: 20.3 mL/min (A) (by C-G formula based on SCr of 1.19 mg/dL (H)). Liver Function Tests: Recent Labs  Lab 01/05/20 0525  AST 17  ALT 13  ALKPHOS 44  BILITOT 0.8  PROT 6.2*  ALBUMIN 3.2*   No results for input(s): LIPASE, AMYLASE in the last 168 hours. No results for input(s): AMMONIA in the last 168 hours. Coagulation profile No results for input(s): INR, PROTIME in the last 168 hours.  CBC: Recent Labs  Lab 01/04/20 1506 01/05/20 0525 01/06/20 0240  WBC 10.2 9.5 8.7  NEUTROABS 6.6  --  6.1  HGB 11.7* 11.7* 11.2*  HCT  35.1* 35.4* 34.0*  MCV 99.7 99.4 97.4  PLT 304 288 312   Cardiac Enzymes: No results for input(s): CKTOTAL, CKMB, CKMBINDEX, TROPONINI in the last 168 hours. BNP: Invalid input(s): POCBNP CBG: No results for input(s): GLUCAP in the last 168 hours. D-Dimer No results for input(s): DDIMER in the last 72 hours. Hgb A1c No results for input(s): HGBA1C in the last 72 hours. Lipid Profile No results for input(s): CHOL, HDL, LDLCALC, TRIG, CHOLHDL, LDLDIRECT in the last 72 hours. Thyroid function studies No results for input(s): TSH, T4TOTAL, T3FREE, THYROIDAB in the last 72 hours.  Invalid input(s): FREET3 Anemia work up No results for input(s): VITAMINB12, FOLATE, FERRITIN, TIBC, IRON, RETICCTPCT in the last 72 hours. Microbiology Recent Results (from the past 240 hour(s))  Respiratory Panel by RT PCR (Flu A&B, Covid) - Nasopharyngeal Swab     Status: None   Collection Time: 01/04/20  9:08 PM   Specimen: Nasopharyngeal Swab  Result Value Ref Range Status  SARS Coronavirus 2 by RT PCR NEGATIVE NEGATIVE Final    Comment: (NOTE) SARS-CoV-2 target nucleic acids are NOT DETECTED.  The SARS-CoV-2 RNA is generally detectable in upper respiratoy specimens during the acute phase of infection. The lowest concentration of SARS-CoV-2 viral copies this assay can detect is 131 copies/mL. A negative result does not preclude SARS-Cov-2 infection and should not be used as the sole basis for treatment or other patient management decisions. A negative result may occur with  improper specimen collection/handling, submission of specimen other than nasopharyngeal swab, presence of viral mutation(s) within the areas targeted by this assay, and inadequate number of viral copies (<131 copies/mL). A negative result must be combined with clinical observations, patient history, and epidemiological information. The expected result is Negative.  Fact Sheet for Patients:    PinkCheek.be  Fact Sheet for Healthcare Providers:  GravelBags.it  This test is no t yet approved or cleared by the [place] FDA and  has been authorized for detection and/or diagnosis of SARS-CoV-2 by FDA under an Emergency Use Authorization (EUA). This EUA will remain  in effect (meaning this test can be used) for the duration of the COVID-19 declaration under Section 564(b)(1) of the Act, 21 U.S.C. section 360bbb-3(b)(1), unless the authorization is terminated or revoked sooner.     Influenza A by PCR NEGATIVE NEGATIVE Final   Influenza B by PCR NEGATIVE NEGATIVE Final    Comment: (NOTE) The Xpert Xpress SARS-CoV-2/FLU/RSV assay is intended as an aid in  the diagnosis of influenza from Nasopharyngeal swab specimens and  should not be used as a sole basis for treatment. Nasal washings and  aspirates are unacceptable for Xpert Xpress SARS-CoV-2/FLU/RSV  testing.  Fact Sheet for Patients: PinkCheek.be  Fact Sheet for Healthcare Providers: GravelBags.it  This test is not yet approved or cleared by the [place] FDA and  has been authorized for detection and/or diagnosis of SARS-CoV-2 by  FDA under an Emergency Use Authorization (EUA). This EUA will remain  in effect (meaning this test can be used) for the duration of the  Covid-19 declaration under Section 564(b)(1) of the Act, 21  U.S.C. section 360bbb-3(b)(1), unless the authorization is  terminated or revoked. Performed at [facility], [place] [address] [place], [place] [address]      Signed: Marlowe Aschoff Castiel Lauricella  [facility] 01/07/2020, 11:19 AM

## 2020-01-07 NOTE — TOC CAGE-AID Note (Signed)
Transition of Care [facility]) - CAGE-AID Screening   Patient Details  Name: Cindy [name] MRN: [id number] Date of Birth: March 03, 1923  [facility] CM/SW Contact:    Emeterio Reeve, [place] Phone Number: 01/07/2020, 11:51 AM   Clinical Narrative:  CSW met with pt at bedside. CSW introduced self and explained her role at the hospital.  PT denies alcohol use and substance use. Pt did not need any resources at this time.    CAGE-AID Screening:    Have You Ever Felt You Ought to Cut Down on Your Drinking or Drug Use?: No Have People Annoyed You By Critizing Your Drinking Or Drug Use?: No Have You Felt Bad Or Guilty About Your Drinking Or Drug Use?: No Have You Ever Had a Drink or Used Drugs First Thing In The Morning to Steady Your Nerves or to Get Rid of a Hangover?: No CAGE-AID Score: 0  Substance Abuse Education Offered: Yes    Blima Ledger, [place] Social Worker [phone]

## 2020-01-07 NOTE — TOC Transition Note (Signed)
Transition of Care [facility]) - CM/SW Discharge Note   Patient Details  Name: Moet Mikulski MRN: [id number] Date of Birth: [birth year]  [facility] CM/SW Contact:  Joanne Chars, LCSW Phone Number: 01/07/2020, 12:11 PM   Clinical Narrative:   Pt to DC home today. CSW spoke with pt and also with son [name].   [name] reports he is pt POA but is not legal guardian.  [facility] will resume services.  Discussed equipment needs and pt already has bedside commode, no equipment needs identified by pt or by caregiver, [name].        Barriers to Discharge: Barriers Resolved   Patient Goals and CMS Choice Patient states their goals for this hospitalization and ongoing recovery are:: to go home CMS Medicare.gov Compare Post Acute Care list provided to:: Other (Comment Required)    Discharge Placement                  Name of family member notified: [name], Pandora Leiter Patient and family notified of of transfer: 01/07/20  Discharge Plan and Services     Post Acute Care Choice: Home Health          DME Arranged: N/A         HH Arranged: PT, OT, RN [place] Agency: [facility] Date [place]: 01/07/20 Time [place]: 1205 Representative spoke with at [place]: [place] Interventions     Readmission Risk Interventions No flowsheet data found.

## 2020-01-08 ENCOUNTER — Telehealth: Payer: Self-pay | Admitting: Cardiovascular Disease

## 2020-01-08 NOTE — Telephone Encounter (Signed)
Patient needs a hospital f/u visit with Dr. Gwenlyn Found or assistant. Told them I have nothing available but they wanted to see if a nurse could her in immediately

## 2020-01-09 NOTE — Telephone Encounter (Signed)
Pt has appt 01/10/20 with Dr. Gwenlyn Found will close out this encounter.

## 2020-01-10 ENCOUNTER — Ambulatory Visit (INDEPENDENT_AMBULATORY_CARE_PROVIDER_SITE_OTHER): Payer: Medicare Other | Admitting: Cardiovascular Disease

## 2020-01-10 ENCOUNTER — Other Ambulatory Visit: Payer: Self-pay

## 2020-01-10 ENCOUNTER — Encounter: Payer: Self-pay | Admitting: Cardiovascular Disease

## 2020-01-10 DIAGNOSIS — E782 Mixed hyperlipidemia: Secondary | ICD-10-CM | POA: Diagnosis not present

## 2020-01-10 DIAGNOSIS — I10 Essential (primary) hypertension: Secondary | ICD-10-CM

## 2020-01-10 DIAGNOSIS — I2692 Saddle embolus of pulmonary artery without acute cor pulmonale: Secondary | ICD-10-CM | POA: Diagnosis not present

## 2020-01-10 NOTE — Patient Instructions (Signed)
Medication Instructions:  No Changes In Medications at this time.  *If you need a refill on your cardiac medications before your next appointment, please call your pharmacy*  Lab Work: None Ordered At This Time.  If you have labs (blood work) drawn today and your tests are completely normal, you will receive your results only by: Marland Kitchen MyChart Message (if you have MyChart) OR . A paper copy in the mail If you have any lab test that is abnormal or we need to change your treatment, we will call you to review the results.  Testing/Procedures: None Ordered At This Time.   Follow-Up: At [facility], you and your health needs are our priority.  As part of our continuing mission to provide you with exceptional heart care, we have created designated Provider Care Teams.  These Care Teams include your primary Cardiologist (physician) and Advanced Practice Providers (APPs -  Physician Assistants and Nurse Practitioners) who all work together to provide you with the care you need, when you need it.  We recommend signing up for the patient portal called "MyChart".  Sign up information is provided on this After Visit Summary.  MyChart is used to connect with patients for Virtual Visits (Telemedicine).  Patients are able to view lab/test results, encounter notes, upcoming appointments, etc.  Non-urgent messages can be sent to your provider as well.   To learn more about what you can do with MyChart, go to [email or web address].    Your next appointment:   3 month(s)  The format for your next appointment:   In Person  Provider:   You will see one of the following Advanced Practice Providers on your designated Care Team:    Kerin Ransom, [place]  Then, Quay Burow, MD will plan to see you again in 6 month(s).

## 2020-01-10 NOTE — Assessment & Plan Note (Signed)
History of hyperlipidemia on statin therapy with lipid profile performed 03/05/2019 revealing total cholesterol 202, LDL 120 and HDL 56.

## 2020-01-10 NOTE — Assessment & Plan Note (Signed)
History of essential hypertension a blood pressure measured today at 96/64.  She is not on antihypertensive medications.

## 2020-01-10 NOTE — Assessment & Plan Note (Signed)
Recent admission on August 6 with abdominal pain and lower chest pain ultimately found to have bilateral submassive pulmonary emboli.  She was placed on Eliquis 5 mg a twice daily which was recently decreased to 2.5 mg p.o. twice daily.

## 2020-01-10 NOTE — Progress Notes (Signed)
01/10/2020 Cindy [name]   September 28, 1922  [id number]  Primary Physician [place], Legrand Como, MD Primary Cardiologist: Lorretta Harp MD Lupe Carney, [place]  HPI:  Cindy [name] is a 86 y.o.  thin and frail appearing married Caucasian female whose husband [name] a patient of mine.Unfortunately show passed away on 03-27-16 after they have been married for 68 years. She has 2 living children and 6 grandchildren.I last saw her in the office  06/08/2019.Marland KitchenMarland KitchenShe is accompanied by her caregiver [name] today. She has a history of normal coronary arteries by cath in 2005 and again in 2007. She has chronic left bundle branch block, hypertension and hyperlipidemia. She also has GERD, followed by Dr. Carol Ada with an endoscopy in the past. [name] followed her lipid profile.She is fairly independent and is accompanied by her caregiver who is here for several hours in the morning. She does live alone and continues to drive and to her daily chores.  Since I saw her in the office 8 months ago she was hospitalized on August 6 with bilateral submassive pulmonary emboli and was placed on Eliquis.  She was recently hospitalized with rib fracture after coming out of her bathroom and hitting the side of a piece of furniture.  She still taking care of by Maurice March, her caregiver.  She denies chest pain or shortness of breath.   Current Meds  Medication Sig  . acetaminophen (TYLENOL) 325 MG tablet Take 1-2 tablets (325-650 mg total) by mouth every 6 (six) hours as needed for mild pain or moderate pain (pain score 1-3 or temp > 100.5).  Marland Kitchen apixaban (ELIQUIS) 2.5 MG TABS tablet Take 1 tablet (2.5 mg total) by mouth 2 (two) times daily.  . B Complex Vitamins (B COMPLEX 100 PO) Take 1 tablet by mouth daily.  . Cholecalciferol (VITAMIN D) 125 MCG (5000 UT) CAPS Take 1 tablet by mouth daily.  Marland Kitchen CRANBERRY PO Take 1 tablet by mouth daily.   Marland Kitchen glucosamine-chondroitin 500-400 MG tablet Take 1  tablet by mouth daily.  . hydrocortisone 2.5 % cream Apply 1 application topically 2 (two) times daily as needed (dry skin).   . hydroxypropyl methylcellulose / hypromellose (ISOPTO TEARS / GONIOVISC) 2.5 % ophthalmic solution Place 1 drop into both eyes daily.  Marland Kitchen levothyroxine (SYNTHROID) 75 MCG tablet Take 75 mcg by mouth daily before breakfast.   . lidocaine (LIDODERM) 5 % Place 1 patch onto the skin daily. Remove & Discard patch within 12 hours or as directed by MD  . mirabegron ER (MYRBETRIQ) 50 MG TB24 tablet Take 50 mg by mouth daily.   . Multiple Vitamins-Minerals (HAIR SKIN & NAILS ADVANCED) TABS Take 3 tablets by mouth daily.  . Multiple Vitamins-Minerals (OCUVITE EXTRA) TABS Take 1 tablet by mouth daily.  Marland Kitchen omega-3 acid ethyl esters (LOVAZA) 1 g capsule Take 1 g by mouth daily.   Marland Kitchen omeprazole (PRILOSEC) 20 MG capsule Take 20 mg by mouth daily.  . polyethylene glycol (MIRALAX) 17 g packet Take 17 g by mouth daily.  . simvastatin (ZOCOR) 20 MG tablet Take 1 tablet (20 mg total) by mouth every evening.  . solifenacin (VESICARE) 10 MG tablet Take 10 mg by mouth daily.  . traMADol (ULTRAM) 50 MG tablet Take 50 mg by mouth 2 (two) times daily as needed for moderate pain or severe pain.   . Triamcinolone Acetonide 0.025 % LOTN Apply 1 application topically every other day. For scalp  . triamcinolone cream (KENALOG) 0.1 %  Apply 1 application topically every other day.   . TURMERIC-GINGER PO Take 1 tablet by mouth daily.     Allergies  Allergen Reactions  . Fish Allergy Hives  . Diclofenac Other (See Comments)    unknown  . Ibuprofen Other (See Comments)    Affects kidney function  . Sulfa Antibiotics     Unknown reaction    Social History   Socioeconomic History  . Marital status: Widowed    Spouse name: Not on file  . Number of children: Not on file  . Years of education: Not on file  . Highest education level: Not on file  Occupational History  . Occupation: Retired    Tobacco Use  . Smoking status: Never Smoker  . Smokeless tobacco: Never Used  Vaping Use  . Vaping Use: Never used  Substance and Sexual Activity  . Alcohol use: No  . Drug use: No  . Sexual activity: Not on file  Other Topics Concern  . Not on file  Social History Narrative   Has a caregiver daily   Social Determinants of Health   Financial Resource Strain:   . Difficulty of Paying Living Expenses: Not on file  Food Insecurity:   . Worried About Charity fundraiser in the Last Year: Not on file  . Ran Out of Food in the Last Year: Not on file  Transportation Needs:   . Lack of Transportation (Medical): Not on file  . Lack of Transportation (Non-Medical): Not on file  Physical Activity:   . Days of Exercise per Week: Not on file  . Minutes of Exercise per Session: Not on file  Stress:   . Feeling of Stress : Not on file  Social Connections:   . Frequency of Communication with Friends and Family: Not on file  . Frequency of Social Gatherings with Friends and Family: Not on file  . Attends Religious Services: Not on file  . Active Member of Clubs or Organizations: Not on file  . Attends Archivist Meetings: Not on file  . Marital Status: Not on file  Intimate Partner Violence:   . Fear of Current or Ex-Partner: Not on file  . Emotionally Abused: Not on file  . Physically Abused: Not on file  . Sexually Abused: Not on file     Review of Systems: General: negative for chills, fever, night sweats or weight changes.  Cardiovascular: negative for chest pain, dyspnea on exertion, edema, orthopnea, palpitations, paroxysmal nocturnal dyspnea or shortness of breath Dermatological: negative for rash Respiratory: negative for cough or wheezing Urologic: negative for hematuria Abdominal: negative for nausea, vomiting, diarrhea, bright red blood per rectum, melena, or hematemesis Neurologic: negative for visual changes, syncope, or dizziness All other systems reviewed  and are otherwise negative except as noted above.    Blood pressure 96/64, pulse 64, height 5' (1.524 m), weight 99 lb 12.8 oz (45.3 kg), SpO2 95 %.  General appearance: alert and no distress Neck: no adenopathy, no carotid bruit, no JVD, supple, symmetrical, trachea midline and thyroid not enlarged, symmetric, no tenderness/mass/nodules Lungs: clear to auscultation bilaterally Heart: regular rate and rhythm, S1, S2 normal, no murmur, click, rub or gallop Extremities: extremities normal, atraumatic, no cyanosis or edema Pulses: 2+ and symmetric Skin: Skin color, texture, turgor normal. No rashes or lesions Neurologic: Alert and oriented X 3, normal strength and tone. Normal symmetric reflexes. Normal coordination and gait  EKG not performed today  ASSESSMENT AND PLAN:   Essential hypertension  History of essential hypertension a blood pressure measured today at 96/64.  She is not on antihypertensive medications.  Hyperlipidemia History of hyperlipidemia on statin therapy with lipid profile performed 03/05/2019 revealing total cholesterol 202, LDL 120 and HDL 56.  Pulmonary emboli (HCC) Recent admission on August 6 with abdominal pain and lower chest pain ultimately found to have bilateral submassive pulmonary emboli.  She was placed on Eliquis 5 mg a twice daily which was recently decreased to 2.5 mg p.o. twice daily.      Lorretta Harp MD FACP,FACC,FAHA, [facility] 01/10/2020 3:15 PM

## 2020-04-09 ENCOUNTER — Ambulatory Visit: Payer: Medicare Other | Admitting: Cardiology

## 2020-06-09 ENCOUNTER — Emergency Department (HOSPITAL_COMMUNITY): Payer: Medicare Other

## 2020-06-09 ENCOUNTER — Other Ambulatory Visit: Payer: Self-pay

## 2020-06-09 ENCOUNTER — Encounter (HOSPITAL_COMMUNITY): Payer: Self-pay | Admitting: *Deleted

## 2020-06-09 ENCOUNTER — Observation Stay (HOSPITAL_COMMUNITY)
Admission: EM | Admit: 2020-06-09 | Discharge: 2020-06-10 | Disposition: A | Discharge status: Home / Self Care | Payer: Medicare Other | Attending: Internal Medicine | Admitting: Internal Medicine

## 2020-06-09 DIAGNOSIS — I129 Hypertensive chronic kidney disease with stage 1 through stage 4 chronic kidney disease, or unspecified chronic kidney disease: Secondary | ICD-10-CM | POA: Insufficient documentation

## 2020-06-09 DIAGNOSIS — R52 Pain, unspecified: Secondary | ICD-10-CM

## 2020-06-09 DIAGNOSIS — W19XXXA Unspecified fall, initial encounter: Secondary | ICD-10-CM | POA: Diagnosis not present

## 2020-06-09 DIAGNOSIS — Z791 Long term (current) use of non-steroidal anti-inflammatories (NSAID): Secondary | ICD-10-CM | POA: Diagnosis not present

## 2020-06-09 DIAGNOSIS — Z86711 Personal history of pulmonary embolism: Secondary | ICD-10-CM | POA: Insufficient documentation

## 2020-06-09 DIAGNOSIS — T148XXA Other injury of unspecified body region, initial encounter: Secondary | ICD-10-CM

## 2020-06-09 DIAGNOSIS — M25551 Pain in right hip: Secondary | ICD-10-CM | POA: Diagnosis not present

## 2020-06-09 DIAGNOSIS — M25552 Pain in left hip: Secondary | ICD-10-CM | POA: Insufficient documentation

## 2020-06-09 DIAGNOSIS — G8911 Acute pain due to trauma: Secondary | ICD-10-CM | POA: Diagnosis not present

## 2020-06-09 DIAGNOSIS — M8589 Other specified disorders of bone density and structure, multiple sites: Secondary | ICD-10-CM | POA: Insufficient documentation

## 2020-06-09 DIAGNOSIS — N390 Urinary tract infection, site not specified: Secondary | ICD-10-CM | POA: Insufficient documentation

## 2020-06-09 DIAGNOSIS — I2510 Atherosclerotic heart disease of native coronary artery without angina pectoris: Secondary | ICD-10-CM | POA: Diagnosis not present

## 2020-06-09 DIAGNOSIS — Z23 Encounter for immunization: Secondary | ICD-10-CM | POA: Insufficient documentation

## 2020-06-09 DIAGNOSIS — M170 Bilateral primary osteoarthritis of knee: Secondary | ICD-10-CM | POA: Insufficient documentation

## 2020-06-09 DIAGNOSIS — E039 Hypothyroidism, unspecified: Secondary | ICD-10-CM | POA: Diagnosis present

## 2020-06-09 DIAGNOSIS — M47816 Spondylosis without myelopathy or radiculopathy, lumbar region: Secondary | ICD-10-CM | POA: Diagnosis not present

## 2020-06-09 DIAGNOSIS — S52502A Unspecified fracture of the lower end of left radius, initial encounter for closed fracture: Principal | ICD-10-CM | POA: Insufficient documentation

## 2020-06-09 DIAGNOSIS — I2699 Other pulmonary embolism without acute cor pulmonale: Secondary | ICD-10-CM | POA: Diagnosis present

## 2020-06-09 DIAGNOSIS — N1831 Chronic kidney disease, stage 3a: Secondary | ICD-10-CM | POA: Diagnosis present

## 2020-06-09 DIAGNOSIS — T68XXXA Hypothermia, initial encounter: Secondary | ICD-10-CM

## 2020-06-09 DIAGNOSIS — Z7901 Long term (current) use of anticoagulants: Secondary | ICD-10-CM | POA: Insufficient documentation

## 2020-06-09 DIAGNOSIS — I6782 Cerebral ischemia: Secondary | ICD-10-CM | POA: Insufficient documentation

## 2020-06-09 DIAGNOSIS — N3000 Acute cystitis without hematuria: Secondary | ICD-10-CM | POA: Diagnosis not present

## 2020-06-09 DIAGNOSIS — K219 Gastro-esophageal reflux disease without esophagitis: Secondary | ICD-10-CM | POA: Diagnosis not present

## 2020-06-09 DIAGNOSIS — W108XXA Fall (on) (from) other stairs and steps, initial encounter: Secondary | ICD-10-CM | POA: Diagnosis not present

## 2020-06-09 DIAGNOSIS — I10 Essential (primary) hypertension: Secondary | ICD-10-CM | POA: Diagnosis present

## 2020-06-09 DIAGNOSIS — Z20822 Contact with and (suspected) exposure to covid-19: Secondary | ICD-10-CM | POA: Diagnosis not present

## 2020-06-09 DIAGNOSIS — I700 Atherosclerosis of aorta: Secondary | ICD-10-CM | POA: Insufficient documentation

## 2020-06-09 DIAGNOSIS — I2692 Saddle embolus of pulmonary artery without acute cor pulmonale: Secondary | ICD-10-CM

## 2020-06-09 DIAGNOSIS — Z79899 Other long term (current) drug therapy: Secondary | ICD-10-CM | POA: Insufficient documentation

## 2020-06-09 DIAGNOSIS — Y92009 Unspecified place in unspecified non-institutional (private) residence as the place of occurrence of the external cause: Secondary | ICD-10-CM

## 2020-06-09 DIAGNOSIS — S62102A Fracture of unspecified carpal bone, left wrist, initial encounter for closed fracture: Secondary | ICD-10-CM | POA: Diagnosis present

## 2020-06-09 LAB — CBC WITH DIFFERENTIAL/PLATELET
Abs Immature Granulocytes: 0.05 10*3/uL (ref 0.00–0.07)
Abs Immature Granulocytes: 0.02 10*3/uL (ref 0.00–0.07)
Basophils Absolute: 0 10*3/uL (ref 0.0–0.1)
Basophils Absolute: 0 10*3/uL (ref 0.0–0.1)
Basophils Relative: 0 %
Basophils Relative: 0 %
Eosinophils Absolute: 0.1 10*3/uL (ref 0.0–0.5)
Eosinophils Absolute: 0.1 10*3/uL (ref 0.0–0.5)
Eosinophils Relative: 1 %
Eosinophils Relative: 1 %
HCT: 41.3 % (ref 36.0–46.0)
HCT: 38.8 % (ref 36.0–46.0)
Hemoglobin: 13.4 g/dL (ref 12.0–15.0)
Hemoglobin: 12.9 g/dL (ref 12.0–15.0)
Immature Granulocytes: 0 %
Immature Granulocytes: 0 %
Lymphocytes Relative: 8 %
Lymphocytes Relative: 14 %
Lymphs Abs: 1 10*3/uL (ref 0.7–4.0)
Lymphs Abs: 1.6 10*3/uL (ref 0.7–4.0)
MCH: 31.7 pg (ref 26.0–34.0)
MCH: 32 pg (ref 26.0–34.0)
MCHC: 32.4 g/dL (ref 30.0–36.0)
MCHC: 33.2 g/dL (ref 30.0–36.0)
MCV: 97.6 fL (ref 80.0–100.0)
MCV: 96.3 fL (ref 80.0–100.0)
Monocytes Absolute: 0.9 10*3/uL (ref 0.1–1.0)
Monocytes Absolute: 1.1 10*3/uL — ABNORMAL HIGH (ref 0.1–1.0)
Monocytes Relative: 7 %
Monocytes Relative: 9 %
Neutro Abs: 10.7 10*3/uL — ABNORMAL HIGH (ref 1.7–7.7)
Neutro Abs: 8.5 10*3/uL — ABNORMAL HIGH (ref 1.7–7.7)
Neutrophils Relative %: 84 %
Neutrophils Relative %: 76 %
Platelets: 273 10*3/uL (ref 150–400)
Platelets: 259 10*3/uL (ref 150–400)
RBC: 4.23 MIL/uL (ref 3.87–5.11)
RBC: 4.03 MIL/uL (ref 3.87–5.11)
RDW: 12.6 % (ref 11.5–15.5)
RDW: 12.6 % (ref 11.5–15.5)
WBC: 12.8 10*3/uL — ABNORMAL HIGH (ref 4.0–10.5)
WBC: 11.3 10*3/uL — ABNORMAL HIGH (ref 4.0–10.5)
nRBC: 0 % (ref 0.0–0.2)
nRBC: 0 % (ref 0.0–0.2)

## 2020-06-09 LAB — COMPREHENSIVE METABOLIC PANEL
ALT: 15 U/L (ref 0–44)
AST: 20 U/L (ref 15–41)
Albumin: 3.3 g/dL — ABNORMAL LOW (ref 3.5–5.0)
Alkaline Phosphatase: 48 U/L (ref 38–126)
Anion gap: 11 (ref 5–15)
BUN: 22 mg/dL (ref 8–23)
CO2: 23 mmol/L (ref 22–32)
Calcium: 9.3 mg/dL (ref 8.9–10.3)
Chloride: 107 mmol/L (ref 98–111)
Creatinine, Ser: 1.2 mg/dL — ABNORMAL HIGH (ref 0.44–1.00)
GFR, Estimated: 42 mL/min — ABNORMAL LOW (ref >60)
Glucose, Bld: 109 mg/dL — ABNORMAL HIGH (ref 70–99)
Potassium: 4.3 mmol/L (ref 3.5–5.1)
Sodium: 141 mmol/L (ref 135–145)
Total Bilirubin: 0.6 mg/dL (ref 0.3–1.2)
Total Protein: 6.4 g/dL — ABNORMAL LOW (ref 6.5–8.1)

## 2020-06-09 LAB — URINALYSIS, ROUTINE W REFLEX MICROSCOPIC
Bacteria, UA: NONE SEEN
Bilirubin Urine: NEGATIVE
Glucose, UA: NEGATIVE mg/dL
Ketones, ur: NEGATIVE mg/dL
Nitrite: POSITIVE — AB
Protein, ur: 100 mg/dL — AB
Specific Gravity, Urine: 1.014 (ref 1.005–1.030)
pH: 6 (ref 5.0–8.0)

## 2020-06-09 LAB — MAGNESIUM: Magnesium: 1.7 mg/dL (ref 1.7–2.4)

## 2020-06-09 LAB — RESP PANEL BY RT-PCR (FLU A&B, COVID) ARPGX2
Influenza A by PCR: NEGATIVE
Influenza B by PCR: NEGATIVE
SARS Coronavirus 2 by RT PCR: NEGATIVE

## 2020-06-09 LAB — I-STAT CHEM 8, ED
BUN: 30 mg/dL — ABNORMAL HIGH (ref 8–23)
Calcium, Ion: 1.2 mmol/L (ref 1.15–1.40)
Chloride: 107 mmol/L (ref 98–111)
Creatinine, Ser: 1.3 mg/dL — ABNORMAL HIGH (ref 0.44–1.00)
Glucose, Bld: 114 mg/dL — ABNORMAL HIGH (ref 70–99)
HCT: 41 % (ref 36.0–46.0)
Hemoglobin: 13.9 g/dL (ref 12.0–15.0)
Potassium: 4.3 mmol/L (ref 3.5–5.1)
Sodium: 141 mmol/L (ref 135–145)
TCO2: 24 mmol/L (ref 22–32)

## 2020-06-09 LAB — PROTIME-INR
INR: 1.2 (ref 0.8–1.2)
Prothrombin Time: 14.4 seconds (ref 11.4–15.2)

## 2020-06-09 LAB — APTT: aPTT: 30 seconds (ref 24–36)

## 2020-06-09 MED ORDER — TRAMADOL HCL 50 MG PO TABS
25.0000 mg | ORAL_TABLET | Freq: Four times a day (QID) | ORAL | Status: DC | PRN
  Administered 2020-06-09: 25 mg via ORAL
  Filled 2020-06-09: qty 1

## 2020-06-09 MED ORDER — TETANUS-DIPHTH-ACELL PERTUSSIS 5-2.5-18.5 LF-MCG/0.5 IM SUSY
0.5000 mL | PREFILLED_SYRINGE | Freq: Once | INTRAMUSCULAR | Status: AC
  Administered 2020-06-09: 0.5 mL via INTRAMUSCULAR
  Filled 2020-06-09: qty 0.5

## 2020-06-09 MED ORDER — HYDRALAZINE HCL 20 MG/ML IJ SOLN: 10.0000 mg | Freq: Four times a day (QID) | INTRAMUSCULAR | Status: DC | PRN

## 2020-06-09 MED ORDER — SIMVASTATIN 20 MG PO TABS
20.0000 mg | ORAL_TABLET | Freq: Every evening | ORAL | Status: DC
  Administered 2020-06-09: 20 mg via ORAL
  Filled 2020-06-09: qty 1

## 2020-06-09 MED ORDER — HYDRALAZINE HCL 10 MG PO TABS
10.0000 mg | ORAL_TABLET | ORAL | Status: DC
  Filled 2020-06-09: qty 1

## 2020-06-09 MED ORDER — HYDRALAZINE HCL 20 MG/ML IJ SOLN: 5.0000 mg | Freq: Four times a day (QID) | INTRAMUSCULAR | Status: DC | PRN

## 2020-06-09 MED ORDER — TRAMADOL HCL 50 MG PO TABS: 50.0000 mg | ORAL_TABLET | Freq: Four times a day (QID) | ORAL | Status: DC | PRN

## 2020-06-09 MED ORDER — APIXABAN 2.5 MG PO TABS: 2.5000 mg | ORAL_TABLET | Freq: Two times a day (BID) | ORAL | Status: DC

## 2020-06-09 MED ORDER — APIXABAN 2.5 MG PO TABS
2.5000 mg | ORAL_TABLET | Freq: Two times a day (BID) | ORAL | Status: DC
  Administered 2020-06-09: 2.5 mg via ORAL
  Filled 2020-06-09 (×2): qty 1

## 2020-06-09 MED ORDER — MUPIROCIN 2 % EX OINT: 1.0000 "application " | TOPICAL_OINTMENT | Freq: Two times a day (BID) | CUTANEOUS | 0 refills | Status: DC

## 2020-06-09 MED ORDER — SODIUM CHLORIDE 0.9 % IV SOLN
1.0000 g | Freq: Once | INTRAVENOUS | Status: AC
  Administered 2020-06-09: 1 g via INTRAVENOUS
  Filled 2020-06-09: qty 10

## 2020-06-09 MED ORDER — PANTOPRAZOLE SODIUM 40 MG PO TBEC
40.0000 mg | DELAYED_RELEASE_TABLET | Freq: Every day | ORAL | Status: DC
  Administered 2020-06-09 – 2020-06-10 (×2): 40 mg via ORAL
  Filled 2020-06-09 (×2): qty 1

## 2020-06-09 MED ORDER — ONDANSETRON HCL 4 MG/2ML IJ SOLN: 4.0000 mg | Freq: Four times a day (QID) | INTRAMUSCULAR | Status: DC | PRN

## 2020-06-09 MED ORDER — HYPROMELLOSE (GONIOSCOPIC) 2.5 % OP SOLN
1.0000 [drp] | Freq: Every day | OPHTHALMIC | Status: DC
  Administered 2020-06-09 – 2020-06-10 (×2): 1 [drp] via OPHTHALMIC
  Filled 2020-06-09: qty 15

## 2020-06-09 MED ORDER — TRAMADOL HCL 50 MG PO TABS: 50.0000 mg | ORAL_TABLET | Freq: Two times a day (BID) | ORAL | 0 refills | Status: DC | PRN

## 2020-06-09 MED ORDER — ACETAMINOPHEN 650 MG RE SUPP: 650.0000 mg | Freq: Four times a day (QID) | RECTAL | Status: DC | PRN

## 2020-06-09 MED ORDER — POLYETHYLENE GLYCOL 3350 17 G PO PACK
17.0000 g | PACK | Freq: Every day | ORAL | Status: DC
  Administered 2020-06-09 – 2020-06-10 (×2): 17 g via ORAL
  Filled 2020-06-09 (×2): qty 1

## 2020-06-09 MED ORDER — SODIUM CHLORIDE 0.9 % IV SOLN: INTRAVENOUS | Status: DC

## 2020-06-09 MED ORDER — AMLODIPINE BESYLATE 5 MG PO TABS
10.0000 mg | ORAL_TABLET | Freq: Once | ORAL | Status: AC
  Administered 2020-06-09: 10 mg via ORAL
  Filled 2020-06-09: qty 2

## 2020-06-09 MED ORDER — ACETAMINOPHEN 500 MG PO TABS
1000.0000 mg | ORAL_TABLET | Freq: Once | ORAL | Status: AC
  Administered 2020-06-09: 1000 mg via ORAL

## 2020-06-09 MED ORDER — BACITRACIN ZINC 500 UNIT/GM EX OINT
TOPICAL_OINTMENT | Freq: Two times a day (BID) | CUTANEOUS | Status: DC
  Administered 2020-06-09: 1 via TOPICAL
  Filled 2020-06-09: qty 1.8
  Filled 2020-06-09: qty 28.4
  Filled 2020-06-09: qty 0.9

## 2020-06-09 MED ORDER — ACETAMINOPHEN 325 MG PO TABS
650.0000 mg | ORAL_TABLET | Freq: Four times a day (QID) | ORAL | Status: DC | PRN
  Administered 2020-06-10: 650 mg via ORAL
  Filled 2020-06-09: qty 2

## 2020-06-09 MED ORDER — POLYETHYLENE GLYCOL 3350 17 G PO PACK: 17.0000 g | PACK | Freq: Every day | ORAL | Status: DC | PRN

## 2020-06-09 MED ORDER — SODIUM CHLORIDE 0.9 % IV SOLN: INTRAVENOUS | Status: AC

## 2020-06-09 MED ORDER — SODIUM CHLORIDE 0.9 % IV SOLN
1.0000 g | INTRAVENOUS | Status: DC
  Administered 2020-06-10: 1 g via INTRAVENOUS
  Filled 2020-06-09: qty 10

## 2020-06-09 MED ORDER — CARVEDILOL 6.25 MG PO TABS
6.2500 mg | ORAL_TABLET | Freq: Two times a day (BID) | ORAL | Status: DC
  Administered 2020-06-09 (×2): 6.25 mg via ORAL
  Filled 2020-06-09 (×2): qty 1

## 2020-06-09 MED ORDER — ONDANSETRON HCL 4 MG PO TABS: 4.0000 mg | ORAL_TABLET | Freq: Four times a day (QID) | ORAL | Status: DC | PRN

## 2020-06-09 MED ORDER — LEVOTHYROXINE SODIUM 75 MCG PO TABS
75.0000 ug | ORAL_TABLET | Freq: Every day | ORAL | Status: DC
  Administered 2020-06-09 – 2020-06-10 (×2): 75 ug via ORAL
  Filled 2020-06-09 (×2): qty 1

## 2020-06-09 NOTE — ED Notes (Signed)
Applied bacitracin and non stick bandage to skin tears and abrasions.

## 2020-06-09 NOTE — H&P (Signed)
History and Physical    Cindy [name] SRP:[id number] DOB: [birth year] DOA: 06/09/2020  PCP: Lorne Skeens, MD  Patient coming from: Home   Chief Complaint:  Chief Complaint  Patient presents with  . 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  Patient explains that early this morning she woke from sleep because she was suffering her usual bilateral knee pain secondary to osteoarthritis.  She suddenly recalled that she received a coupon for a new topical analgesic for arthritic pain and could not remember if she threw it away in the garbage can in the garage.  She then proceeded to get out of bed and walk to the garage but as she entered the garage she slipped down the stairs.  Patient denies loss of consciousness precipitated the fall.  Patient does state that she struck her head on the concrete for the garage.  Immediately after the fall patient experienced severe left hand and wrist pain, sharp in quality, radiating proximally, worse with movement of the affected extremity.  Upon further questioning patient denies recent fevers weakness, dysuria poor appetite or abdominal pain.  EMS was called and the patient was promptly brought to [facility] emergency department for evaluation.  Upon evaluation in the emergency department patient was found to have a impacted intra-articular distal radius fracture with mildly displaced ulnar styloid fracture.  Remainder of trauma survey ended up being negative.  Patient also suffered multiple soft tissue injuries which were addressed by the emergency department provider.  Urinalysis was found to be suggestive of urinary tract infection with associated  leukocytosis and the patient was administered intravenous ceftriaxone.  The hospitalist group was then called to assess patient for admission to the hospital.  Review of Systems:   Review of Systems  Musculoskeletal: Positive for falls and joint pain.  All other systems reviewed and are negative.   Past Medical History:  Diagnosis Date  . Arthritis   . Chronic renal insufficiency, stage III (moderate) (HCC)   . Depression   . Dyslipidemia   . Forgetfulness   . GERD (gastroesophageal reflux disease)    takes Omeprazole for Hital Hernia   . Hiatal hernia   . History of nuclear stress test 2005   persantine; inferoapical ischmeia w/ EF 83%; no CAD at cath  . HOH (hard of hearing)    hearing aids  . Hypertension   . Hypothyroidism   . LBBB (left bundle branch block)    chronic per Dr. Gwenlyn Found  . Lung nodule 07/01/03   right middle lobe  . Macular degeneration   . Malignant melanoma ([place]) 11/12/04   L upper arm, MOHS_ right hand, leg  . Murmur, cardiac   . Osteoporosis   . Pneumonia 06/2018    Past Surgical History:  Procedure Laterality Date  . BLADDER REPAIR    . CARDIAC CATHETERIZATION  2005/2007   normal coronaries   . CATARACT EXTRACTION     bilat  . coccyx  1951   excision  . COLONOSCOPY    . knee     right cartilage repair  . REVERSE SHOULDER ARTHROPLASTY Left 08/04/2018   Procedure: REVERSE LEFT SHOULDER ARTHROPLASTY;  Surgeon: Netta Cedars, MD;  Location: [place];  Service: Orthopedics;  Laterality: Left;  .  ROTATOR CUFF REPAIR     right  . TOTAL ABDOMINAL HYSTERECTOMY W/ BILATERAL SALPINGOOPHORECTOMY    . TRANSTHORACIC ECHOCARDIOGRAM  2007   EF=>55%; borderline conc LVH; LA mod dilated; RA mild-mod dilated; mild TR; mild AVR with mildly sclerotic AV leavlefts; mild PV regurg      reports that she has never smoked. She has never used smokeless tobacco. She reports that she does not drink alcohol and does not use drugs.  Allergies  Allergen Reactions  . Fish  Allergy Hives  . Diclofenac Other (See Comments)    unknown  . Ibuprofen Other (See Comments)    Affects kidney function  . Sulfa Antibiotics     Unknown reaction    Family History  Problem Relation Age of Onset  . Cancer Father        NHL  . Cancer Maternal Grandmother        rectal  . Cancer Daughter        breast  . Cancer Maternal Aunt        breast  . Cancer Son        melanoma     Prior to Admission medications   Medication Sig Start Date End Date Taking? Authorizing Provider  mupirocin ointment (BACTROBAN) 2 % Apply 1 application topically 2 (two) times daily. 06/09/20  Yes Palumbo, April, MD  traMADol (ULTRAM) 50 MG tablet Take 1 tablet (50 mg total) by mouth every 12 (twelve) hours as needed for severe pain. 06/09/20  Yes Palumbo, April, MD  acetaminophen (TYLENOL) 325 MG tablet Take 1-2 tablets (325-650 mg total) by mouth every 6 (six) hours as needed for mild pain or moderate pain (pain score 1-3 or temp > 100.5). 08/07/18   Netta Cedars, MD  apixaban (ELIQUIS) 2.5 MG TABS tablet Take 1 tablet (2.5 mg total) by mouth 2 (two) times daily. 01/07/20 02/06/20  Terrilee Croak, MD  B Complex Vitamins (B COMPLEX 100 PO) Take 1 tablet by mouth daily.    [provider]  Cholecalciferol (VITAMIN D) 125 MCG (5000 UT) CAPS Take 1 tablet by mouth daily.    [provider]  CRANBERRY PO Take 1 tablet by mouth daily.     [provider]  glucosamine-chondroitin 500-400 MG tablet Take 1 tablet by mouth daily.    [provider]  hydrocortisone 2.5 % cream Apply 1 application topically 2 (two) times daily as needed (dry skin).  02/23/18   [provider]  hydroxypropyl methylcellulose / hypromellose (ISOPTO TEARS / GONIOVISC) 2.5 % ophthalmic solution Place 1 drop into both eyes daily.    [provider]  levothyroxine (SYNTHROID) 75 MCG tablet Take 75 mcg by mouth daily before breakfast.  07/01/16   [provider]  lidocaine  (LIDODERM) 5 % Place 1 patch onto the skin daily. Remove & Discard patch within 12 hours or as directed by MD 01/07/20   Terrilee Croak, MD  mirabegron ER (MYRBETRIQ) 50 MG TB24 tablet Take 50 mg by mouth daily.  03/01/17   [provider]  Multiple Vitamins-Minerals (HAIR SKIN & NAILS ADVANCED) TABS Take 3 tablets by mouth daily.    [provider]  Multiple Vitamins-Minerals (OCUVITE EXTRA) TABS Take 1 tablet by mouth daily.    [provider]  omega-3 acid ethyl esters (LOVAZA) 1 g capsule Take 1 g by mouth daily.     [provider]  omeprazole (PRILOSEC) 20 MG capsule Take 20 mg by mouth daily.    [provider]  polyethylene glycol (MIRALAX) 17 g packet Take 17 g by mouth daily. 03/07/19   Shelly Coss, MD  simvastatin (ZOCOR) 20 MG tablet Take 1 tablet (20 mg total) by mouth every evening. 11/19/19   Shahmehdi, Valeria Batman, MD  solifenacin (VESICARE) 10 MG tablet Take 10 mg by mouth daily. 01/26/19   [provider]  traMADol (ULTRAM) 50 MG tablet Take 50 mg by mouth 2 (two) times daily as needed for moderate pain or severe pain.  09/12/18   [provider]  Triamcinolone Acetonide 0.025 % LOTN Apply 1 application topically every other day. For scalp 08/24/19   [provider]  triamcinolone cream (KENALOG) 0.1 % Apply 1 application topically every other day.     [provider]  TURMERIC-GINGER PO Take 1 tablet by mouth daily.    [provider]    Physical Exam: Vitals:   06/09/20 0400 06/09/20 0600 06/09/20 0630 06/09/20 0645  BP: (!) 158/82 (!) 123/105 (!) 137/101   Pulse: 65 74 72 64  Resp: 17 19 (!) 24 19  Temp:      TempSrc:      SpO2: 93%  91% 94%  Weight:        Constitutional: Acute alert and oriented x3, no associated distress.   Skin: Notable significant skin tear over left face and left temporal scalp that are covered in dressings that are clean dry and intact.  Additional wounds noted  of the bilateral forearms with dressings that are clean dry and intact.  Poor skin turgor noted.   Eyes: Pupils are equally reactive to light.  No evidence of scleral icterus or conjunctival pallor.  ENMT: Somewhat dry mucous membranes noted.  Posterior pharynx clear of any exudate or lesions.   Neck: normal, supple, no masses, no thyromegaly.  No evidence of jugular venous distension.   Respiratory: clear to auscultation bilaterally, no wheezing, no crackles. Normal respiratory effort. No accessory muscle use.  Cardiovascular: Regular rate and rhythm, no murmurs / rubs / gallops. No extremity edema. 2+ pedal pulses. No carotid bruits.  Chest:   Nontender without crepitus or deformity.   Back:   Nontender without crepitus or deformity. Abdomen: Mild lower abdominal tenderness that is soft.  No evidence of intra-abdominal masses.  Positive bowel sounds noted in all quadrants.   Musculoskeletal: No joint deformity upper and lower extremities. Good ROM, no contractures. Normal muscle tone.  Neurologic: CN 2-12 grossly intact. Sensation intact.  Patient moving all 4 extremities spontaneously.  Patient is following all commands.  Patient is responsive to verbal stimuli.   Psychiatric: Patient exhibits normal mood with appropriate affect.  Patient seems to possess insight as to their current situation.     Labs on Admission: I have personally reviewed following labs and imaging studies -   CBC: Recent Labs  Lab 06/09/20 0252 06/09/20 0342  WBC 12.8*  --   NEUTROABS 10.7*  --   HGB 13.4 13.9  HCT 41.3 41.0  MCV 97.6  --   PLT 273  --    Basic Metabolic Panel: Recent Labs  Lab 06/09/20 0342  NA 141  K 4.3  CL 107  GLUCOSE 114*  BUN 30*  CREATININE 1.30*   GFR: Estimated Creatinine Clearance: 18.3 mL/min (A) (by C-G formula based on SCr of 1.3 mg/dL (H)). Liver Function Tests: No results for input(s): AST, ALT, ALKPHOS, BILITOT, PROT, ALBUMIN in the last 168 hours. No results for  input(s): LIPASE, AMYLASE in the  last 168 hours. No results for input(s): AMMONIA in the last 168 hours. Coagulation Profile: No results for input(s): INR, PROTIME in the last 168 hours. Cardiac Enzymes: No results for input(s): CKTOTAL, CKMB, CKMBINDEX, TROPONINI in the last 168 hours. BNP (last 3 results) No results for input(s): PROBNP in the last 8760 hours. HbA1C: No results for input(s): HGBA1C in the last 72 hours. CBG: No results for input(s): GLUCAP in the last 168 hours. Lipid Profile: No results for input(s): CHOL, HDL, LDLCALC, TRIG, CHOLHDL, LDLDIRECT in the last 72 hours. Thyroid Function Tests: No results for input(s): TSH, T4TOTAL, FREET4, T3FREE, THYROIDAB in the last 72 hours. Anemia Panel: No results for input(s): VITAMINB12, FOLATE, FERRITIN, TIBC, IRON, RETICCTPCT in the last 72 hours. Urine analysis:    Component Value Date/Time   COLORURINE YELLOW 06/09/2020 0252   APPEARANCEUR HAZY (A) 06/09/2020 0252   LABSPEC 1.014 06/09/2020 0252   PHURINE 6.0 06/09/2020 0252   GLUCOSEU NEGATIVE 06/09/2020 0252   HGBUR SMALL (A) 06/09/2020 0252   BILIRUBINUR NEGATIVE 06/09/2020 0252   KETONESUR NEGATIVE 06/09/2020 0252   PROTEINUR 100 (A) 06/09/2020 0252   UROBILINOGEN 0.2 11/22/2013 2218   NITRITE POSITIVE (A) 06/09/2020 0252   LEUKOCYTESUR SMALL (A) 06/09/2020 0252    Radiological Exams on Admission - Personally Reviewed: DG Chest 1 View  Result Date: 06/09/2020 CLINICAL DATA:  Pain status post fall EXAM: CHEST  1 VIEW COMPARISON:  01/04/2020 FINDINGS: Again noted is chronic elevation of the left hemidiaphragm. The heart size appears stable. There is no pneumothorax. No large pleural effusion. Atelectasis is noted at the lung bases, left greater than right. There is no acute osseous abnormality. Aortic calcifications are noted. IMPRESSION: No active disease. Electronically Signed   By: Constance Holster M.D.   On: 06/09/2020 01:31   DG Wrist Complete  Left  Result Date: 06/09/2020 CLINICAL DATA:  Fall EXAM: LEFT WRIST - COMPLETE 3+ VIEW COMPARISON:  None. FINDINGS: Comminuted impacted intra-articular distal radius fracture is seen. There is a mildly displaced ulnar styloid fracture. Dorsal soft tissue swelling is noted. Advanced first [facility] joint osteoarthritis is noted. There is diffuse osteopenia which somewhat limits evaluation of the carpals. IMPRESSION: Comminuted impacted intra-articular distal radius fracture. Mildly displaced ulnar styloid fracture. Electronically Signed   By: Prudencio Pair M.D.   On: 06/09/2020 01:23   CT Head Wo Contrast  Result Date: 06/09/2020 CLINICAL DATA:  Fall EXAM: CT HEAD WITHOUT CONTRAST TECHNIQUE: Contiguous axial images were obtained from the base of the skull through the vertex without intravenous contrast. COMPARISON:  None. FINDINGS: Brain: No evidence of acute territorial infarction, hemorrhage, hydrocephalus,extra-axial collection or mass lesion/mass effect. There is dilatation the ventricles and sulci consistent with age-related atrophy. Low-attenuation changes in the deep white matter consistent with small vessel ischemia. Vascular: No hyperdense vessel or unexpected calcification. Skull: The skull is intact. No fracture or focal lesion identified. Sinuses/Orbits: The visualized paranasal sinuses and mastoid air cells are clear. The orbits and globes intact. Other: None Cervical spine: Alignment: There is a minimal anterolisthesis of C3 on C4. Skull base and vertebrae: Visualized skull base is intact. No atlanto-occipital dissociation. Capsular hypertrophy is seen at the atlantoaxial articulation causing mild canal narrowing. The vertebral body heights are well maintained. No fracture or pathologic osseous lesion seen. Soft tissues and spinal canal: The visualized paraspinal soft tissues are unremarkable. No prevertebral soft tissue swelling is seen. The spinal canal is grossly unremarkable, no large epidural  collection or significant canal narrowing. Disc levels:  Multilevel cervical spine spondylosis is seen with disc osteophyte complex and uncovertebral osteophytes most notable at C4-C5 with moderate central canal stenosis and severe bilateral neural foraminal narrowing. For Upper chest: Mild biapical scarring is noted. Thoracic inlet is within normal limits. Other: None IMPRESSION: No acute intracranial abnormality. Findings consistent with age related atrophy and chronic small vessel ischemia No acute fracture or malalignment of the spine. Electronically Signed   By: Prudencio Pair M.D.   On: 06/09/2020 01:06   CT Cervical Spine Wo Contrast  Result Date: 06/09/2020 CLINICAL DATA:  Fall EXAM: CT HEAD WITHOUT CONTRAST TECHNIQUE: Contiguous axial images were obtained from the base of the skull through the vertex without intravenous contrast. COMPARISON:  None. FINDINGS: Brain: No evidence of acute territorial infarction, hemorrhage, hydrocephalus,extra-axial collection or mass lesion/mass effect. There is dilatation the ventricles and sulci consistent with age-related atrophy. Low-attenuation changes in the deep white matter consistent with small vessel ischemia. Vascular: No hyperdense vessel or unexpected calcification. Skull: The skull is intact. No fracture or focal lesion identified. Sinuses/Orbits: The visualized paranasal sinuses and mastoid air cells are clear. The orbits and globes intact. Other: None Cervical spine: Alignment: There is a minimal anterolisthesis of C3 on C4. Skull base and vertebrae: Visualized skull base is intact. No atlanto-occipital dissociation. Capsular hypertrophy is seen at the atlantoaxial articulation causing mild canal narrowing. The vertebral body heights are well maintained. No fracture or pathologic osseous lesion seen. Soft tissues and spinal canal: The visualized paraspinal soft tissues are unremarkable. No prevertebral soft tissue swelling is seen. The spinal canal is grossly  unremarkable, no large epidural collection or significant canal narrowing. Disc levels: Multilevel cervical spine spondylosis is seen with disc osteophyte complex and uncovertebral osteophytes most notable at C4-C5 with moderate central canal stenosis and severe bilateral neural foraminal narrowing. For Upper chest: Mild biapical scarring is noted. Thoracic inlet is within normal limits. Other: None IMPRESSION: No acute intracranial abnormality. Findings consistent with age related atrophy and chronic small vessel ischemia No acute fracture or malalignment of the spine. Electronically Signed   By: Prudencio Pair M.D.   On: 06/09/2020 01:06   CT PELVIS WO CONTRAST  Result Date: 06/09/2020 CLINICAL DATA:  Acute pain due to trauma EXAM: CT PELVIS WITHOUT CONTRAST TECHNIQUE: Multidetector CT imaging of the pelvis was performed following the standard protocol without intravenous contrast. COMPARISON:  None. FINDINGS: Urinary Tract:  No abnormality visualized. Bowel: There is diffuse sigmoid diverticulosis without CT evidence for diverticulitis. Vascular/Lymphatic: Vascular calcifications are noted of the visualized abdominal aorta. There are no pathologically enlarged lymph nodes. Reproductive:  The patient is status post prior hysterectomy. Other:  There are bilateral fat containing inguinal hernias. Musculoskeletal: There are advanced degenerative changes at the L4-L5 level resulting in a grade 1-2 anterolisthesis of L4 on L5. There is a small fluid collection lateral to the greater trochanter of the proximal left femur. This may represent a small hematoma. There is no acute displaced fracture. However, evaluation is limited by osteopenia. IMPRESSION: 1. No acute displaced fracture. However, evaluation is limited by osteopenia. 2. Small fluid collection lateral to the greater trochanter of the proximal left femur. This may represent a small hematoma. 3. There are advanced degenerative changes of the lower lumbar  spine as detailed above. Aortic Atherosclerosis (ICD10-I70.0). Electronically Signed   By: Constance Holster M.D.   On: 06/09/2020 02:46   DG Hand Complete Right  Result Date: 06/09/2020 CLINICAL DATA:  Pain EXAM: RIGHT HAND - COMPLETE 3+ VIEW COMPARISON:  None. FINDINGS: There is osteopenia which limits detection of nondisplaced fractures. There are advanced degenerative changes of the first carpometacarpal joint. There is no acute displaced fracture or dislocation. There is a probable old fracture of the ulnar styloid process. Degenerative changes are noted of the wrist and interphalangeal joints. IMPRESSION: 1. No acute displaced fracture or dislocation. 2. Advanced degenerative changes of the first carpometacarpal joint. 3. Osteopenia. Electronically Signed   By: Constance Holster M.D.   On: 06/09/2020 03:31   DG HIP UNILAT WITH PELVIS 2-3 VIEWS LEFT  Result Date: 06/09/2020 CLINICAL DATA:  Pain status post fall EXAM: DG HIP (WITH OR WITHOUT PELVIS) 2-3V LEFT; DG HIP (WITH OR WITHOUT PELVIS) 2-3V RIGHT COMPARISON:  CT dated 11/16/2019. FINDINGS: There is diffuse osteopenia which limits detection of nondisplaced fractures. There are moderate degenerative changes of both hips. There is no acute displaced fracture or dislocation. Vascular calcifications are noted. IMPRESSION: 1. Diffuse osteopenia which limits detection of nondisplaced fractures. 2. No acute displaced fracture or dislocation. 3. Moderate degenerative changes of both hips. Electronically Signed   By: Constance Holster M.D.   On: 06/09/2020 01:30   DG HIP UNILAT WITH PELVIS 2-3 VIEWS RIGHT  Result Date: 06/09/2020 CLINICAL DATA:  Pain status post fall EXAM: DG HIP (WITH OR WITHOUT PELVIS) 2-3V LEFT; DG HIP (WITH OR WITHOUT PELVIS) 2-3V RIGHT COMPARISON:  CT dated 11/16/2019. FINDINGS: There is diffuse osteopenia which limits detection of nondisplaced fractures. There are moderate degenerative changes of both hips. There is no acute  displaced fracture or dislocation. Vascular calcifications are noted. IMPRESSION: 1. Diffuse osteopenia which limits detection of nondisplaced fractures. 2. No acute displaced fracture or dislocation. 3. Moderate degenerative changes of both hips. Electronically Signed   By: Constance Holster M.D.   On: 06/09/2020 01:30   Telemetry: Personally reviewed.  Rhythm is normal sinus rhythm at 75BPM.  Assessment/Plan Principal Problem:   Fall at home, initial encounter   Patient suffering mechanical fall at home resulting in left forearm/wrist trauma including impacted intra-articular distal radius fracture and mild displaced ulnar styloid fracture.  While patient denies symptoms that precipitated the fall, patient does appear clinically slightly dry with mild lower abdominal tenderness on examination and urinalysis suggestive of urinary tract infection  Hydrating patient with intravenous isotonic fluids  Treating patient for suspected urinary tract infection  Will contact Ortho hand surgery on day shift for evaluation of injuries  Monitoring patient on telemetry  Low-dose opiate-based analgesics for associated pain  Active Problems:   Acute cystitis without hematuria   Urinalysis suggestive of urinary tract infection  While patient denies symptoms patient appears somewhat clinically dry with lower abdominal tenderness on exam  Considering advanced age fall and leukocytosis initiating treatment with intravenous ceftriaxone  Urine culture obtained, will de-escalate antibiotic therapy based on these results.    Left wrist fracture, closed, initial encounter   Left wrist x-ray reveals impacted intra-articular distal radius fracture and mild displaced ulnar styloid fracture.  Currently splinted by emergency department provider  Providing patient with low-dose opiate-based analgesics for associated pain  Will obtain orthopedic hand consultation on day shift    Essential  hypertension   Uncontrolled blood pressure  As of late, patient has not taken any antihypertensives as initiation of any chronic therapy seems to result in hypotension -this is detailed further in notes pertaining to patient's last hospitalization  We will simply provide as needed intravenous) for markedly elevated blood pressures.    Pulmonary embolism ([place])   On  chronic Eliquis therapy since diagnosis of saddle pulmonary embolus in August 2021    Hypothyroidism  . Resume home regimen of Synthroid    Chronic kidney disease, stage 3a ([place])   . Strict intake and output monitoring . Creatinine near baseline . Minimizing nephrotoxic agents as much as possible . Serial chemistries to monitor renal function and electrolytes    Code Status:  DNR Family Communication: Patient has requested we not call her children this early in the morning.  Of note, patient's son has power of attorney however patient is currently capable for making her own decisions  Status is: Observation  The patient remains OBS appropriate and will d/c before 2 midnights.  Dispo: The patient is from: Home              Anticipated d/c is to: Home              Patient currently is not medically stable to d/c.   Difficult to place patient No        Vernelle Emerald MD [facility] Pager [phone]  If 7PM-7AM, please contact night-coverage [email or web address] Use universal [place] password for that web site. If you do not have the password, please call the hospital operator.  06/09/2020, 7:02 AM

## 2020-06-09 NOTE — Consult Note (Addendum)
WOC Nurse Consult Note: Reason for Consult: Abrasion resulting in full thickness skin loss to left face and neck (15cm) with area over eyebrow approximated with steri-strips (4). Also Right index finger with 1.4cm x 0.2cm laceration surrounded by ecchymosis, edema. Left arm is s/p surgical intervention. Wound type: trauma, full thickness Pressure Injury POA: N/A Measurement:15cm x 4cm x 0.2cm Wound bed:red, moist Drainage (amount, consistency, odor) serosanguinous in a small amount Periwound: ecchymosis Dressing procedure/placement/frequency: I will provide Nursing with conservative care orders for the face using xeroform gauze, topped with dry gauze and secured with paper tape.  The right index finger will have a similar treatment.  Bilateral heel boots, a sacral prophylactic foam and a pressure redistribution chair pad are provided for PI prevention.  The sacrum is reddened, but blanches.  [facility] nursing team will not follow, but will remain available to this patient, the nursing and medical teams.  Please re-consult if needed. Thanks, Maudie Flakes, MSN, RN, [place], Arther Abbott  Pager# [phone]

## 2020-06-09 NOTE — Consult Note (Signed)
Reason for Consult:Left wrist fx Referring Physician: Ronnie Derby Time called: 0820 Time at bedside: [name] is an 87 y.o. female.  HPI: Cindy [name] was in her garage looking for a coupon when she lost her balance and fell. She tried to catch herself on some shelves but failed and fell to the floor. She had immediate bilateral wrist pain and hit her face. She was brought to the ED where x-rays showed a left wrist fx. Hand surgery was consulted the following morning. She lives at home independently with the help of an aide and is RHD.  Past Medical History:  Diagnosis Date  . Arthritis   . Chronic renal insufficiency, stage III (moderate) (HCC)   . Depression   . Dyslipidemia   . Forgetfulness   . GERD (gastroesophageal reflux disease)    takes Omeprazole for Hital Hernia   . Hiatal hernia   . History of nuclear stress test 2005   persantine; inferoapical ischmeia w/ EF 83%; no CAD at cath  . HOH (hard of hearing)    hearing aids  . Hypertension   . Hypothyroidism   . LBBB (left bundle branch block)    chronic per Dr. Gwenlyn Found  . Lung nodule 07/01/03   right middle lobe  . Macular degeneration   . Malignant melanoma ([place]) 11/12/04   L upper arm, MOHS_ right hand, leg  . Murmur, cardiac   . Osteoporosis   . Pneumonia 06/2018    Past Surgical History:  Procedure Laterality Date  . BLADDER REPAIR    . CARDIAC CATHETERIZATION  2005/2007   normal coronaries   . CATARACT EXTRACTION     bilat  . coccyx  1951   excision  . COLONOSCOPY    . knee     right cartilage repair  . REVERSE SHOULDER ARTHROPLASTY Left 08/04/2018   Procedure: REVERSE LEFT SHOULDER ARTHROPLASTY;  Surgeon: Netta Cedars, MD;  Location: [facility];  Service: Orthopedics;  Laterality: Left;  . ROTATOR CUFF REPAIR     right  . TOTAL ABDOMINAL HYSTERECTOMY W/ BILATERAL SALPINGOOPHORECTOMY    . TRANSTHORACIC ECHOCARDIOGRAM  2007   EF=>55%; borderline conc LVH; LA mod dilated; RA mild-mod dilated; mild TR; mild AVR  with mildly sclerotic AV leavlefts; mild PV regurg     Family History  Problem Relation Age of Onset  . Cancer Father        NHL  . Cancer Maternal Grandmother        rectal  . Cancer Daughter        breast  . Cancer Maternal Aunt        breast  . Cancer Son        melanoma    Social History:  reports that she has never smoked. She has never used smokeless tobacco. She reports that she does not drink alcohol and does not use drugs.  Allergies:  Allergies  Allergen Reactions  . Fish Allergy Hives  . Diclofenac Other (See Comments)    unknown  . Ibuprofen Other (See Comments)    Affects kidney function  . Sulfa Antibiotics     Unknown reaction    Medications: I have reviewed the patient's current medications.  Results for orders placed or performed during the hospital encounter of 06/09/20 (from the past 48 hour(s))  CBC with Differential/Platelet     Status: Abnormal   Collection Time: 06/09/20  2:52 AM  Result Value Ref Range   WBC 12.8 (H) 4.0 - 10.5 K/uL  RBC 4.23 3.87 - 5.11 MIL/uL   Hemoglobin 13.4 12.0 - 15.0 g/dL   HCT 41.3 36.0 - 46.0 %   MCV 97.6 80.0 - 100.0 fL   MCH 31.7 26.0 - 34.0 pg   MCHC 32.4 30.0 - 36.0 g/dL   RDW 12.6 11.5 - 15.5 %   Platelets 273 150 - 400 K/uL   nRBC 0.0 0.0 - 0.2 %   Neutrophils Relative % 84 %   Neutro Abs 10.7 (H) 1.7 - 7.7 K/uL   Lymphocytes Relative 8 %   Lymphs Abs 1.0 0.7 - 4.0 K/uL   Monocytes Relative 7 %   Monocytes Absolute 0.9 0.1 - 1.0 K/uL   Eosinophils Relative 1 %   Eosinophils Absolute 0.1 0.0 - 0.5 K/uL   Basophils Relative 0 %   Basophils Absolute 0.0 0.0 - 0.1 K/uL   Immature Granulocytes 0 %   Abs Immature Granulocytes 0.05 0.00 - 0.07 K/uL    Comment: Performed at [facility] [address], [place], [place] [address]  Urinalysis, Routine w reflex microscopic Urine, Catheterized     Status: Abnormal   Collection Time: 06/09/20  2:52 AM  Result Value Ref Range   Color, Urine YELLOW YELLOW    APPearance HAZY (A) CLEAR   Specific Gravity, Urine 1.014 1.005 - 1.030   pH 6.0 5.0 - 8.0   Glucose, UA NEGATIVE NEGATIVE mg/dL   Hgb urine dipstick SMALL (A) NEGATIVE   Bilirubin Urine NEGATIVE NEGATIVE   Ketones, ur NEGATIVE NEGATIVE mg/dL   Protein, ur 100 (A) NEGATIVE mg/dL   Nitrite POSITIVE (A) NEGATIVE   Leukocytes,Ua SMALL (A) NEGATIVE   RBC / HPF 6-10 0 - 5 RBC/hpf   WBC, UA 21-50 0 - 5 WBC/hpf   Bacteria, UA NONE SEEN NONE SEEN   Mucus PRESENT     Comment: Performed at [facility] [address], [place],  [address]  Resp Panel by RT-PCR (Flu A&B, Covid) Nasopharyngeal Swab     Status: None   Collection Time: 06/09/20  2:52 AM   Specimen: Nasopharyngeal Swab; Nasopharyngeal(NP) swabs in vial transport medium  Result Value Ref Range   SARS Coronavirus 2 by RT PCR NEGATIVE NEGATIVE    Comment: (NOTE) SARS-CoV-2 target nucleic acids are NOT DETECTED.  The SARS-CoV-2 RNA is generally detectable in upper respiratory specimens during the acute phase of infection. The lowest concentration of SARS-CoV-2 viral copies this assay can detect is 138 copies/mL. A negative result does not preclude SARS-Cov-2 infection and should not be used as the sole basis for treatment or other patient management decisions. A negative result may occur with  improper specimen collection/handling, submission of specimen other than nasopharyngeal swab, presence of viral mutation(s) within the areas targeted by this assay, and inadequate number of viral copies(<138 copies/mL). A negative result must be combined with clinical observations, patient history, and epidemiological information. The expected result is Negative.  Fact Sheet for Patients:  EntrepreneurPulse.com.au  Fact Sheet for Healthcare Providers:  IncredibleEmployment.be  This test is no t yet approved or cleared by the [place] FDA and  has been authorized for detection  and/or diagnosis of SARS-CoV-2 by FDA under an Emergency Use Authorization (EUA). This EUA will remain  in effect (meaning this test can be used) for the duration of the COVID-19 declaration under Section 564(b)(1) of the Act, 21 U.S.C.section 360bbb-3(b)(1), unless the authorization is terminated  or revoked sooner.       Influenza A  by PCR NEGATIVE NEGATIVE   Influenza B by PCR NEGATIVE NEGATIVE    Comment: (NOTE) The Xpert Xpress SARS-CoV-2/FLU/RSV plus assay is intended as an aid in the diagnosis of influenza from Nasopharyngeal swab specimens and should not be used as a sole basis for treatment. Nasal washings and aspirates are unacceptable for Xpert Xpress SARS-CoV-2/FLU/RSV testing.  Fact Sheet for Patients: EntrepreneurPulse.com.au  Fact Sheet for Healthcare Providers: IncredibleEmployment.be  This test is not yet approved or cleared by the [place] FDA and has been authorized for detection and/or diagnosis of SARS-CoV-2 by FDA under an Emergency Use Authorization (EUA). This EUA will remain in effect (meaning this test can be used) for the duration of the COVID-19 declaration under Section 564(b)(1) of the Act, 21 U.S.C. section 360bbb-3(b)(1), unless the authorization is terminated or revoked.  Performed at [facility], [place] [address] [place], [place] [address]   I-stat chem 8, ED (not at [facility] or [facility])     Status: Abnormal   Collection Time: 06/09/20  3:42 AM  Result Value Ref Range   Sodium 141 135 - 145 mmol/L   Potassium 4.3 3.5 - 5.1 mmol/L   Chloride 107 98 - 111 mmol/L   BUN 30 (H) 8 - 23 mg/dL   Creatinine, Ser 1.30 (H) 0.44 - 1.00 mg/dL   Glucose, Bld 114 (H) 70 - 99 mg/dL    Comment: Glucose reference range applies only to samples taken after fasting for at least 8 hours.   Calcium, Ion 1.20 1.15 - 1.40 mmol/L   TCO2 24 22 - 32 mmol/L   Hemoglobin 13.9 12.0 - 15.0 g/dL   HCT 41.0 36.0 - 46.0 %   Comprehensive metabolic panel     Status: Abnormal   Collection Time: 06/09/20  7:34 AM  Result Value Ref Range   Sodium 141 135 - 145 mmol/L   Potassium 4.3 3.5 - 5.1 mmol/L   Chloride 107 98 - 111 mmol/L   CO2 23 22 - 32 mmol/L   Glucose, Bld 109 (H) 70 - 99 mg/dL    Comment: Glucose reference range applies only to samples taken after fasting for at least 8 hours.   BUN 22 8 - 23 mg/dL   Creatinine, Ser 1.20 (H) 0.44 - 1.00 mg/dL   Calcium 9.3 8.9 - 10.3 mg/dL   Total Protein 6.4 (L) 6.5 - 8.1 g/dL   Albumin 3.3 (L) 3.5 - 5.0 g/dL   AST 20 15 - 41 U/L   ALT 15 0 - 44 U/L   Alkaline Phosphatase 48 38 - 126 U/L   Total Bilirubin 0.6 0.3 - 1.2 mg/dL   GFR, Estimated 42 (L) >60 mL/min    Comment: (NOTE) Calculated using the CKD-EPI Creatinine Equation (2021)    Anion gap 11 5 - 15    Comment: Performed at [facility], [place] [address] [place], [place] [address]  Magnesium     Status: None   Collection Time: 06/09/20  7:34 AM  Result Value Ref Range   Magnesium 1.7 1.7 - 2.4 mg/dL    Comment: Performed at [facility] [address], [place],  [address]  CBC WITH DIFFERENTIAL     Status: Abnormal   Collection Time: 06/09/20  7:34 AM  Result Value Ref Range   WBC 11.3 (H) 4.0 - 10.5 K/uL   RBC 4.03 3.87 - 5.11 MIL/uL   Hemoglobin 12.9 12.0 - 15.0 g/dL   HCT 38.8 36.0 - 46.0 %  MCV 96.3 80.0 - 100.0 fL   MCH 32.0 26.0 - 34.0 pg   MCHC 33.2 30.0 - 36.0 g/dL   RDW 12.6 11.5 - 15.5 %   Platelets 259 150 - 400 K/uL   nRBC 0.0 0.0 - 0.2 %   Neutrophils Relative % 76 %   Neutro Abs 8.5 (H) 1.7 - 7.7 K/uL   Lymphocytes Relative 14 %   Lymphs Abs 1.6 0.7 - 4.0 K/uL   Monocytes Relative 9 %   Monocytes Absolute 1.1 (H) 0.1 - 1.0 K/uL   Eosinophils Relative 1 %   Eosinophils Absolute 0.1 0.0 - 0.5 K/uL   Basophils Relative 0 %   Basophils Absolute 0.0 0.0 - 0.1 K/uL   Immature Granulocytes 0 %   Abs Immature Granulocytes 0.02 0.00 - 0.07 K/uL    Comment:  Performed at [facility] [address], [place], [place] [address]  APTT     Status: None   Collection Time: 06/09/20  7:34 AM  Result Value Ref Range   aPTT 30 24 - 36 seconds    Comment: Performed at [facility] [address], [place], [place] [address]  Protime-INR     Status: None   Collection Time: 06/09/20  7:34 AM  Result Value Ref Range   Prothrombin Time 14.4 11.4 - 15.2 seconds   INR 1.2 0.8 - 1.2    Comment: (NOTE) INR goal varies based on device and disease states. Performed at [facility], [place] [address] [place], [place] [address]     DG Chest 1 View  Result Date: 06/09/2020 CLINICAL DATA:  Pain status post fall EXAM: CHEST  1 VIEW COMPARISON:  01/04/2020 FINDINGS: Again noted is chronic elevation of the left hemidiaphragm. The heart size appears stable. There is no pneumothorax. No large pleural effusion. Atelectasis is noted at the lung bases, left greater than right. There is no acute osseous abnormality. Aortic calcifications are noted. IMPRESSION: No active disease. Electronically Signed   By: Constance Holster M.D.   On: 06/09/2020 01:31   DG Wrist Complete Left  Result Date: 06/09/2020 CLINICAL DATA:  Fall EXAM: LEFT WRIST - COMPLETE 3+ VIEW COMPARISON:  None. FINDINGS: Comminuted impacted intra-articular distal radius fracture is seen. There is a mildly displaced ulnar styloid fracture. Dorsal soft tissue swelling is noted. [facility] joint osteoarthritis is noted. There is diffuse osteopenia which somewhat limits evaluation of the carpals. IMPRESSION: Comminuted impacted intra-articular distal radius fracture. Mildly displaced ulnar styloid fracture. Electronically Signed   By: Prudencio Pair M.D.   On: 06/09/2020 01:23   CT Head Wo Contrast  Result Date: 06/09/2020 CLINICAL DATA:  Fall EXAM: CT HEAD WITHOUT CONTRAST TECHNIQUE: Contiguous axial images were obtained from the base of the skull through the vertex without intravenous  contrast. COMPARISON:  None. FINDINGS: Brain: No evidence of acute territorial infarction, hemorrhage, hydrocephalus,extra-axial collection or mass lesion/mass effect. There is dilatation the ventricles and sulci consistent with age-related atrophy. Low-attenuation changes in the deep white matter consistent with small vessel ischemia. Vascular: No hyperdense vessel or unexpected calcification. Skull: The skull is intact. No fracture or focal lesion identified. Sinuses/Orbits: The visualized paranasal sinuses and mastoid air cells are clear. The orbits and globes intact. Other: None Cervical spine: Alignment: There is a minimal anterolisthesis of C3 on C4. Skull base and vertebrae: Visualized skull base is intact. No atlanto-occipital dissociation. Capsular hypertrophy is seen at the atlantoaxial articulation causing mild canal narrowing. The  vertebral body heights are well maintained. No fracture or pathologic osseous lesion seen. Soft tissues and spinal canal: The visualized paraspinal soft tissues are unremarkable. No prevertebral soft tissue swelling is seen. The spinal canal is grossly unremarkable, no large epidural collection or significant canal narrowing. Disc levels: Multilevel cervical spine spondylosis is seen with disc osteophyte complex and uncovertebral osteophytes most notable at C4-C5 with moderate central canal stenosis and severe bilateral neural foraminal narrowing. For Upper chest: Mild biapical scarring is noted. Thoracic inlet is within normal limits. Other: None IMPRESSION: No acute intracranial abnormality. Findings consistent with age related atrophy and chronic small vessel ischemia No acute fracture or malalignment of the spine. Electronically Signed   By: Prudencio Pair M.D.   On: 06/09/2020 01:06   CT Cervical Spine Wo Contrast  Result Date: 06/09/2020 CLINICAL DATA:  Fall EXAM: CT HEAD WITHOUT CONTRAST TECHNIQUE: Contiguous axial images were obtained from the base of the skull  through the vertex without intravenous contrast. COMPARISON:  None. FINDINGS: Brain: No evidence of acute territorial infarction, hemorrhage, hydrocephalus,extra-axial collection or mass lesion/mass effect. There is dilatation the ventricles and sulci consistent with age-related atrophy. Low-attenuation changes in the deep white matter consistent with small vessel ischemia. Vascular: No hyperdense vessel or unexpected calcification. Skull: The skull is intact. No fracture or focal lesion identified. Sinuses/Orbits: The visualized paranasal sinuses and mastoid air cells are clear. The orbits and globes intact. Other: None Cervical spine: Alignment: There is a minimal anterolisthesis of C3 on C4. Skull base and vertebrae: Visualized skull base is intact. No atlanto-occipital dissociation. Capsular hypertrophy is seen at the atlantoaxial articulation causing mild canal narrowing. The vertebral body heights are well maintained. No fracture or pathologic osseous lesion seen. Soft tissues and spinal canal: The visualized paraspinal soft tissues are unremarkable. No prevertebral soft tissue swelling is seen. The spinal canal is grossly unremarkable, no large epidural collection or significant canal narrowing. Disc levels: Multilevel cervical spine spondylosis is seen with disc osteophyte complex and uncovertebral osteophytes most notable at C4-C5 with moderate central canal stenosis and severe bilateral neural foraminal narrowing. For Upper chest: Mild biapical scarring is noted. Thoracic inlet is within normal limits. Other: None IMPRESSION: No acute intracranial abnormality. Findings consistent with age related atrophy and chronic small vessel ischemia No acute fracture or malalignment of the spine. Electronically Signed   By: Prudencio Pair M.D.   On: 06/09/2020 01:06   CT PELVIS WO CONTRAST  Result Date: 06/09/2020 CLINICAL DATA:  Acute pain due to trauma EXAM: CT PELVIS WITHOUT CONTRAST TECHNIQUE: Multidetector CT  imaging of the pelvis was performed following the standard protocol without intravenous contrast. COMPARISON:  None. FINDINGS: Urinary Tract:  No abnormality visualized. Bowel: There is diffuse sigmoid diverticulosis without CT evidence for diverticulitis. Vascular/Lymphatic: Vascular calcifications are noted of the visualized abdominal aorta. There are no pathologically enlarged lymph nodes. Reproductive:  The patient is status post prior hysterectomy. Other:  There are bilateral fat containing inguinal hernias. Musculoskeletal: There are advanced degenerative changes at the L4-L5 level resulting in a grade 1-2 anterolisthesis of L4 on L5. There is a small fluid collection lateral to the greater trochanter of the proximal left femur. This may represent a small hematoma. There is no acute displaced fracture. However, evaluation is limited by osteopenia. IMPRESSION: 1. No acute displaced fracture. However, evaluation is limited by osteopenia. 2. Small fluid collection lateral to the greater trochanter of the proximal left femur. This may represent a small hematoma. 3. There are advanced degenerative  changes of the lower lumbar spine as detailed above. Aortic Atherosclerosis (ICD10-I70.0). Electronically Signed   By: Constance Holster M.D.   On: 06/09/2020 02:46   DG Hand Complete Right  Result Date: 06/09/2020 CLINICAL DATA:  Pain EXAM: RIGHT HAND - COMPLETE 3+ VIEW COMPARISON:  None. FINDINGS: There is osteopenia which limits detection of nondisplaced fractures. There are advanced degenerative changes of the first carpometacarpal joint. There is no acute displaced fracture or dislocation. There is a probable old fracture of the ulnar styloid process. Degenerative changes are noted of the wrist and interphalangeal joints. IMPRESSION: 1. No acute displaced fracture or dislocation. 2. Advanced degenerative changes of the first carpometacarpal joint. 3. Osteopenia. Electronically Signed   By: Constance Holster  M.D.   On: 06/09/2020 03:31   DG HIP UNILAT WITH PELVIS 2-3 VIEWS LEFT  Result Date: 06/09/2020 CLINICAL DATA:  Pain status post fall EXAM: DG HIP (WITH OR WITHOUT PELVIS) 2-3V LEFT; DG HIP (WITH OR WITHOUT PELVIS) 2-3V RIGHT COMPARISON:  CT dated 11/16/2019. FINDINGS: There is diffuse osteopenia which limits detection of nondisplaced fractures. There are moderate degenerative changes of both hips. There is no acute displaced fracture or dislocation. Vascular calcifications are noted. IMPRESSION: 1. Diffuse osteopenia which limits detection of nondisplaced fractures. 2. No acute displaced fracture or dislocation. 3. Moderate degenerative changes of both hips. Electronically Signed   By: Constance Holster M.D.   On: 06/09/2020 01:30   DG HIP UNILAT WITH PELVIS 2-3 VIEWS RIGHT  Result Date: 06/09/2020 CLINICAL DATA:  Pain status post fall EXAM: DG HIP (WITH OR WITHOUT PELVIS) 2-3V LEFT; DG HIP (WITH OR WITHOUT PELVIS) 2-3V RIGHT COMPARISON:  CT dated 11/16/2019. FINDINGS: There is diffuse osteopenia which limits detection of nondisplaced fractures. There are moderate degenerative changes of both hips. There is no acute displaced fracture or dislocation. Vascular calcifications are noted. IMPRESSION: 1. Diffuse osteopenia which limits detection of nondisplaced fractures. 2. No acute displaced fracture or dislocation. 3. Moderate degenerative changes of both hips. Electronically Signed   By: Constance Holster M.D.   On: 06/09/2020 01:30    Review of Systems  HENT: Negative for ear discharge, ear pain, hearing loss and tinnitus.   Eyes: Negative for photophobia and pain.  Respiratory: Negative for cough and shortness of breath.   Cardiovascular: Negative for chest pain.  Gastrointestinal: Negative for abdominal pain, nausea and vomiting.  Genitourinary: Negative for dysuria, flank pain, frequency and urgency.  Musculoskeletal: Positive for arthralgias (Bilateral wrists, L>R). Negative for back pain,  myalgias and neck pain.  Neurological: Negative for dizziness and headaches.  Hematological: Does not bruise/bleed easily.  Psychiatric/Behavioral: The patient is not nervous/anxious.    Blood pressure (!) 184/157, pulse 75, temperature 97.6 F (36.4 C), temperature source Oral, resp. rate 20, weight 44.9 kg, SpO2 93 %. Physical Exam Constitutional:      General: She is not in acute distress.    Appearance: She is well-developed and well-nourished. She is not diaphoretic.  HENT:     Head: Normocephalic and atraumatic.  Eyes:     General: No scleral icterus.       Right eye: No discharge.        Left eye: No discharge.     Conjunctiva/sclera: Conjunctivae normal.  Cardiovascular:     Rate and Rhythm: Normal rate and regular rhythm.  Pulmonary:     Effort: Pulmonary effort is normal. No respiratory distress.  Musculoskeletal:     Cervical back: Normal range of motion.  Comments: Left shoulder, elbow, wrist, digits- no skin wounds, short arm splint in place, no instability, no blocks to motion  Sens  Ax/R/M/U grossly intact  Mot   Ax/ R/ PIN/ M/ AIN/ U grossly intact  Cap refill <2s  Skin:    General: Skin is warm and dry.  Neurological:     Mental Status: She is alert.  Psychiatric:        Mood and Affect: Mood and affect normal.        Behavior: Behavior normal.     Assessment/Plan: Left wrist fx -- Continue splint, NWB. Anticipate non-operative management. F/u with Dr. Caralyn Guile or Dr. Amedeo Plenty in 1-2 weeks.    Lisette Abu, PA-C Orthopedic Surgery [phone] 06/09/2020, 9:38 AM

## 2020-06-09 NOTE — ED Triage Notes (Signed)
Pt is brought in by ems from home where she fell down 3 steps. Pt is on elaquis.  Pt denies any LOC or striking head but she does have a skin tear on left temporal, left cheek and left side of neck and an area and abrasion to left foread, left wrist deformed.  Pt is alert and oriented. Lac to right pointer finger.

## 2020-06-09 NOTE — ED Provider Notes (Addendum)
[facility] EMERGENCY DEPARTMENT Provider Note   CSN: [id number] Arrival date & time: 06/09/20  0038     History Chief Complaint  Patient presents with  . Trauma  . Fall    Nena Hampe is a 87 y.o. female.  The history is provided by the EMS personnel. The history is limited by the condition of the patient (level 5 dementia ).  Trauma Mechanism of injury: fall Injury location: face (skin tears of the left face and neck left wrist ) Injury location detail: forehead Incident location: home Arrived directly from scene: yes   Fall:      Fall occurred: down stairs (2 steps to the garage )      Impact surface: hard floor      Point of impact: head      Entrapped after fall: no  Protective equipment:       None      Suspicion of drug use: no  EMS/PTA data:      Bystander interventions: none      Ambulatory at scene: yes      Blood loss: minimal      Responsiveness: alert      Oriented to: person      Loss of consciousness: no      Airway interventions: none      Breathing interventions: none      IV access: none      IO access: none      Fluids administered: none      Cardiac interventions: none      Medications administered: none      Immobilization: none      Airway condition since incident: stable      Breathing condition since incident: stable      Circulation condition since incident: stable      Mental status condition since incident: stable      Disability condition since incident: stable  Current symptoms:      Pain quality: aching      Pain timing: constant      Associated symptoms:            Denies abdominal pain, back pain, loss of consciousness and nausea.   Relevant PMH:      Medical risk factors:            No asthma.       Pharmacological risk factors:            Anticoagulation therapy.       Tetanus status: unknown      The patient has not been admitted to the hospital due to injury in the past year, and has not been  treated and released from the ED due to injury in the past year. Fall Pertinent negatives include no abdominal pain.  Patient fell down 2-3 stairs into her garage while "looking for a coupon".       Past Medical History:  Diagnosis Date  . Arthritis   . Chronic renal insufficiency, stage III (moderate) (HCC)   . Depression   . Dyslipidemia   . Forgetfulness   . GERD (gastroesophageal reflux disease)    takes Omeprazole for Hital Hernia   . Hiatal hernia   . History of nuclear stress test 2005   persantine; inferoapical ischmeia w/ EF 83%; no CAD at cath  . HOH (hard of hearing)    hearing aids  . Hypertension   . Hypothyroidism   . LBBB (left bundle branch block)  chronic per Dr. Gwenlyn Found  . Lung nodule 07/01/03   right middle lobe  . Macular degeneration   . Malignant melanoma ([place]) 11/12/04   L upper arm, MOHS_ right hand, leg  . Murmur, cardiac   . Osteoporosis   . Pneumonia 06/2018    Patient Active Problem List   Diagnosis Date Noted  . Hemorrhage of right kidney 01/04/2020  . Renal hemorrhage, left 01/04/2020  . Rib fractures 01/04/2020  . Pulmonary emboli ([place]) 11/17/2019  . Palliative care by specialist   . Goals of care, counseling/discussion   . General weakness   . Pulmonary embolism ([place]) 11/16/2019  . Dementia ([place]) 11/16/2019  . Hypothyroidism 11/16/2019  . Pancreatic mass 11/16/2019  . NSTEMI (non-ST elevated myocardial infarction) ([place]) 03/05/2019  . Pain due to onychomycosis of toenails of both feet 10/03/2018  . H/O total shoulder replacement, left 08/04/2018  . Pneumonia 06/13/2018  . Pressure injury of skin 06/13/2018  . Abdominal pain 11/08/2016  . Acute lower UTI 11/08/2016  . Acute cystitis without hematuria   . SBO (small bowel obstruction) ([place]) 06/28/2016  . Constipation, chronic 06/28/2016  . Acute distention of stomach 06/28/2016  . Eventration of left crus of diaphragm 06/27/2016  . Uncontrolled hypertension 06/27/2016  . Gastric  outlet obstruction 06/27/2016  . CRI (chronic renal insufficiency), stage 4 (severe) ([place]) 06/14/2016  . Primary hypothyroidism 06/14/2016  . Osteoporosis, postmenopausal 06/14/2016  . OAB (overactive bladder) 02/25/2016  . Left bundle branch block 12/14/2012  . Essential hypertension 12/14/2012  . Hyperlipidemia 12/14/2012  . Pain in shoulder 11/11/2011  . Melanoma of left upper arm ([place]) 10/19/2011  . Lung mass 06/16/2011  . Acquired deformities of toe 06/14/2011  . Personal history of other diseases of the digestive system 06/07/2011  . GERD (gastroesophageal reflux disease) 06/07/2011    Past Surgical History:  Procedure Laterality Date  . BLADDER REPAIR    . CARDIAC CATHETERIZATION  2005/2007   normal coronaries   . CATARACT EXTRACTION     bilat  . coccyx  1951   excision  . COLONOSCOPY    . knee     right cartilage repair  . REVERSE SHOULDER ARTHROPLASTY Left 08/04/2018   Procedure: REVERSE LEFT SHOULDER ARTHROPLASTY;  Surgeon: Netta Cedars, MD;  Location: [facility];  Service: Orthopedics;  Laterality: Left;  . ROTATOR CUFF REPAIR     right  . TOTAL ABDOMINAL HYSTERECTOMY W/ BILATERAL SALPINGOOPHORECTOMY    . TRANSTHORACIC ECHOCARDIOGRAM  2007   EF=>55%; borderline conc LVH; LA mod dilated; RA mild-mod dilated; mild TR; mild AVR with mildly sclerotic AV leavlefts; mild PV regurg      OB History   No obstetric history on file.     Family History  Problem Relation Age of Onset  . Cancer Father        NHL  . Cancer Maternal Grandmother        rectal  . Cancer Daughter        breast  . Cancer Maternal Aunt        breast  . Cancer Son        melanoma    Social History   Tobacco Use  . Smoking status: Never Smoker  . Smokeless tobacco: Never Used  Vaping Use  . Vaping Use: Never used  Substance Use Topics  . Alcohol use: No  . Drug use: No    Home Medications Prior to Admission medications   Medication Sig Start Date End Date Taking? Authorizing  Provider  acetaminophen (TYLENOL) 325 MG tablet Take 1-2 tablets (325-650 mg total) by mouth every 6 (six) hours as needed for mild pain or moderate pain (pain score 1-3 or temp > 100.5). 08/07/18   Netta Cedars, MD  apixaban (ELIQUIS) 2.5 MG TABS tablet Take 1 tablet (2.5 mg total) by mouth 2 (two) times daily. 01/07/20 02/06/20  Terrilee Croak, MD  B Complex Vitamins (B COMPLEX 100 PO) Take 1 tablet by mouth daily.    [provider]  Cholecalciferol (VITAMIN D) 125 MCG (5000 UT) CAPS Take 1 tablet by mouth daily.    [provider]  CRANBERRY PO Take 1 tablet by mouth daily.     [provider]  glucosamine-chondroitin 500-400 MG tablet Take 1 tablet by mouth daily.    [provider]  hydrocortisone 2.5 % cream Apply 1 application topically 2 (two) times daily as needed (dry skin).  02/23/18   [provider]  hydroxypropyl methylcellulose / hypromellose (ISOPTO TEARS / GONIOVISC) 2.5 % ophthalmic solution Place 1 drop into both eyes daily.    [provider]  levothyroxine (SYNTHROID) 75 MCG tablet Take 75 mcg by mouth daily before breakfast.  07/01/16   [provider]  lidocaine (LIDODERM) 5 % Place 1 patch onto the skin daily. Remove & Discard patch within 12 hours or as directed by MD 01/07/20   Terrilee Croak, MD  mirabegron ER (MYRBETRIQ) 50 MG TB24 tablet Take 50 mg by mouth daily.  03/01/17   [provider]  Multiple Vitamins-Minerals (HAIR SKIN & NAILS ADVANCED) TABS Take 3 tablets by mouth daily.    [provider]  Multiple Vitamins-Minerals (OCUVITE EXTRA) TABS Take 1 tablet by mouth daily.    [provider]  omega-3 acid ethyl esters (LOVAZA) 1 g capsule Take 1 g by mouth daily.     [provider]  omeprazole (PRILOSEC) 20 MG capsule Take 20 mg by mouth daily.    [provider]  polyethylene glycol (MIRALAX) 17 g packet Take 17 g by mouth daily. 03/07/19   Shelly Coss, MD   simvastatin (ZOCOR) 20 MG tablet Take 1 tablet (20 mg total) by mouth every evening. 11/19/19   Shahmehdi, Valeria Batman, MD  solifenacin (VESICARE) 10 MG tablet Take 10 mg by mouth daily. 01/26/19   [provider]  traMADol (ULTRAM) 50 MG tablet Take 50 mg by mouth 2 (two) times daily as needed for moderate pain or severe pain.  09/12/18   [provider]  Triamcinolone Acetonide 0.025 % LOTN Apply 1 application topically every other day. For scalp 08/24/19   [provider]  triamcinolone cream (KENALOG) 0.1 % Apply 1 application topically every other day.     [provider]  TURMERIC-GINGER PO Take 1 tablet by mouth daily.    [provider]    Allergies    Fish allergy, Diclofenac, Ibuprofen, and Sulfa antibiotics  Review of Systems   Review of Systems  Unable to perform ROS: Dementia  Constitutional: Negative for fever.  Gastrointestinal: Negative for abdominal pain and nausea.  Musculoskeletal: Negative for back pain.  Skin: Positive for wound.  Neurological: Negative for loss of consciousness and facial asymmetry.  Psychiatric/Behavioral: Negative for agitation.    Physical Exam Updated Vital Signs Wt 44.9 kg   BMI 19.33 kg/m   Physical Exam Vitals and nursing note reviewed.  Constitutional:      General: She is not in acute distress.    Appearance: Normal appearance.  HENT:     Head: Normocephalic.      Nose: Nose normal.  Eyes:     Conjunctiva/sclera: Conjunctivae normal.     Pupils: Pupils are equal, round, and reactive to light.  Cardiovascular:     Rate and Rhythm: Normal rate and regular rhythm.     Pulses: Normal pulses.     Heart sounds: Normal heart sounds.  Pulmonary:     Effort: Pulmonary effort is normal.     Breath sounds: Normal breath sounds.  Abdominal:     General: Abdomen is flat. Bowel sounds are normal.     Palpations: Abdomen is soft.     Tenderness: There is no abdominal tenderness. There is no  guarding.  Musculoskeletal:     Cervical back: Normal. No tenderness.     Thoracic back: Normal.     Lumbar back: Normal.  Lymphadenopathy:     Cervical: No cervical adenopathy.  Skin:    General: Skin is warm and dry.     Capillary Refill: Capillary refill takes less than 2 seconds.     Findings: No rash.  Neurological:     General: No focal deficit present.     Mental Status: She is alert.     Cranial Nerves: No cranial nerve deficit.     Deep Tendon Reflexes: Reflexes normal.  Psychiatric:        Mood and Affect: Mood normal.     ED Results / Procedures / Treatments   Labs (all labs ordered are listed, but only abnormal results are displayed) Results for orders placed or performed during the hospital encounter of 06/09/20  Resp Panel by RT-PCR (Flu A&B, Covid) Nasopharyngeal Swab   Specimen: Nasopharyngeal Swab; Nasopharyngeal(NP) swabs in vial transport medium  Result Value Ref Range   SARS Coronavirus 2 by RT PCR NEGATIVE NEGATIVE   Influenza A by PCR NEGATIVE NEGATIVE   Influenza B by PCR NEGATIVE NEGATIVE  CBC with Differential/Platelet  Result Value Ref Range   WBC 12.8 (H) 4.0 - 10.5 K/uL   RBC 4.23 3.87 - 5.11 MIL/uL   Hemoglobin 13.4 12.0 - 15.0 g/dL   HCT 41.3 36.0 - 46.0 %   MCV 97.6 80.0 - 100.0 fL   MCH 31.7 26.0 - 34.0 pg   MCHC 32.4 30.0 - 36.0 g/dL   RDW 12.6 11.5 - 15.5 %   Platelets 273 150 - 400 K/uL   nRBC 0.0 0.0 - 0.2 %   Neutrophils Relative % 84 %   Neutro Abs 10.7 (H) 1.7 - 7.7 K/uL   Lymphocytes Relative 8 %   Lymphs Abs 1.0 0.7 - 4.0 K/uL   Monocytes Relative 7 %   Monocytes Absolute 0.9 0.1 - 1.0 K/uL   Eosinophils Relative 1 %   Eosinophils Absolute 0.1 0.0 - 0.5 K/uL   Basophils Relative 0 %   Basophils Absolute 0.0 0.0 - 0.1 K/uL   Immature Granulocytes 0 %   Abs Immature Granulocytes 0.05 0.00 - 0.07 K/uL  I-stat chem 8, ED (not at [facility] or [facility])  Result Value Ref Range   Sodium 141 135 - 145 mmol/L   Potassium 4.3 3.5 - 5.1  mmol/L   Chloride 107 98 - 111 mmol/L   BUN 30 (H) 8 - 23 mg/dL   Creatinine, Ser 1.30 (H) 0.44 - 1.00 mg/dL   Glucose, Bld 114 (H) 70 - 99 mg/dL   Calcium, Ion 1.20 1.15 - 1.40 mmol/L   TCO2 24 22 - 32 mmol/L  Hemoglobin 13.9 12.0 - 15.0 g/dL   HCT 41.0 36.0 - 46.0 %   DG Chest 1 View  Result Date: 06/09/2020 CLINICAL DATA:  Pain status post fall EXAM: CHEST  1 VIEW COMPARISON:  01/04/2020 FINDINGS: Again noted is chronic elevation of the left hemidiaphragm. The heart size appears stable. There is no pneumothorax. No large pleural effusion. Atelectasis is noted at the lung bases, left greater than right. There is no acute osseous abnormality. Aortic calcifications are noted. IMPRESSION: No active disease. Electronically Signed   By: Constance Holster M.D.   On: 06/09/2020 01:31   DG Wrist Complete Left  Result Date: 06/09/2020 CLINICAL DATA:  Fall EXAM: LEFT WRIST - COMPLETE 3+ VIEW COMPARISON:  None. FINDINGS: Comminuted impacted intra-articular distal radius fracture is seen. There is a mildly displaced ulnar styloid fracture. Dorsal soft tissue swelling is noted. Advanced first [facility] joint osteoarthritis is noted. There is diffuse osteopenia which somewhat limits evaluation of the carpals. IMPRESSION: Comminuted impacted intra-articular distal radius fracture. Mildly displaced ulnar styloid fracture. Electronically Signed   By: Prudencio Pair M.D.   On: 06/09/2020 01:23   CT Head Wo Contrast  Result Date: 06/09/2020 CLINICAL DATA:  Fall EXAM: CT HEAD WITHOUT CONTRAST TECHNIQUE: Contiguous axial images were obtained from the base of the skull through the vertex without intravenous contrast. COMPARISON:  None. FINDINGS: Brain: No evidence of acute territorial infarction, hemorrhage, hydrocephalus,extra-axial collection or mass lesion/mass effect. There is dilatation the ventricles and sulci consistent with age-related atrophy. Low-attenuation changes in the deep white matter consistent with small  vessel ischemia. Vascular: No hyperdense vessel or unexpected calcification. Skull: The skull is intact. No fracture or focal lesion identified. Sinuses/Orbits: The visualized paranasal sinuses and mastoid air cells are clear. The orbits and globes intact. Other: None Cervical spine: Alignment: There is a minimal anterolisthesis of C3 on C4. Skull base and vertebrae: Visualized skull base is intact. No atlanto-occipital dissociation. Capsular hypertrophy is seen at the atlantoaxial articulation causing mild canal narrowing. The vertebral body heights are well maintained. No fracture or pathologic osseous lesion seen. Soft tissues and spinal canal: The visualized paraspinal soft tissues are unremarkable. No prevertebral soft tissue swelling is seen. The spinal canal is grossly unremarkable, no large epidural collection or significant canal narrowing. Disc levels: Multilevel cervical spine spondylosis is seen with disc osteophyte complex and uncovertebral osteophytes most notable at C4-C5 with moderate central canal stenosis and severe bilateral neural foraminal narrowing. For Upper chest: Mild biapical scarring is noted. Thoracic inlet is within normal limits. Other: None IMPRESSION: No acute intracranial abnormality. Findings consistent with age related atrophy and chronic small vessel ischemia No acute fracture or malalignment of the spine. Electronically Signed   By: Prudencio Pair M.D.   On: 06/09/2020 01:06   CT Cervical Spine Wo Contrast  Result Date: 06/09/2020 CLINICAL DATA:  Fall EXAM: CT HEAD WITHOUT CONTRAST TECHNIQUE: Contiguous axial images were obtained from the base of the skull through the vertex without intravenous contrast. COMPARISON:  None. FINDINGS: Brain: No evidence of acute territorial infarction, hemorrhage, hydrocephalus,extra-axial collection or mass lesion/mass effect. There is dilatation the ventricles and sulci consistent with age-related atrophy. Low-attenuation changes in the deep  white matter consistent with small vessel ischemia. Vascular: No hyperdense vessel or unexpected calcification. Skull: The skull is intact. No fracture or focal lesion identified. Sinuses/Orbits: The visualized paranasal sinuses and mastoid air cells are clear. The orbits and globes intact. Other: None Cervical spine: Alignment: There is a minimal anterolisthesis of C3  on C4. Skull base and vertebrae: Visualized skull base is intact. No atlanto-occipital dissociation. Capsular hypertrophy is seen at the atlantoaxial articulation causing mild canal narrowing. The vertebral body heights are well maintained. No fracture or pathologic osseous lesion seen. Soft tissues and spinal canal: The visualized paraspinal soft tissues are unremarkable. No prevertebral soft tissue swelling is seen. The spinal canal is grossly unremarkable, no large epidural collection or significant canal narrowing. Disc levels: Multilevel cervical spine spondylosis is seen with disc osteophyte complex and uncovertebral osteophytes most notable at C4-C5 with moderate central canal stenosis and severe bilateral neural foraminal narrowing. For Upper chest: Mild biapical scarring is noted. Thoracic inlet is within normal limits. Other: None IMPRESSION: No acute intracranial abnormality. Findings consistent with age related atrophy and chronic small vessel ischemia No acute fracture or malalignment of the spine. Electronically Signed   By: Prudencio Pair M.D.   On: 06/09/2020 01:06   CT PELVIS WO CONTRAST  Result Date: 06/09/2020 CLINICAL DATA:  Acute pain due to trauma EXAM: CT PELVIS WITHOUT CONTRAST TECHNIQUE: Multidetector CT imaging of the pelvis was performed following the standard protocol without intravenous contrast. COMPARISON:  None. FINDINGS: Urinary Tract:  No abnormality visualized. Bowel: There is diffuse sigmoid diverticulosis without CT evidence for diverticulitis. Vascular/Lymphatic: Vascular calcifications are noted of the  visualized abdominal aorta. There are no pathologically enlarged lymph nodes. Reproductive:  The patient is status post prior hysterectomy. Other:  There are bilateral fat containing inguinal hernias. Musculoskeletal: There are advanced degenerative changes at the L4-L5 level resulting in a grade 1-2 anterolisthesis of L4 on L5. There is a small fluid collection lateral to the greater trochanter of the proximal left femur. This may represent a small hematoma. There is no acute displaced fracture. However, evaluation is limited by osteopenia. IMPRESSION: 1. No acute displaced fracture. However, evaluation is limited by osteopenia. 2. Small fluid collection lateral to the greater trochanter of the proximal left femur. This may represent a small hematoma. 3. There are advanced degenerative changes of the lower lumbar spine as detailed above. Aortic Atherosclerosis (ICD10-I70.0). Electronically Signed   By: Constance Holster M.D.   On: 06/09/2020 02:46   DG Hand Complete Right  Result Date: 06/09/2020 CLINICAL DATA:  Pain EXAM: RIGHT HAND - COMPLETE 3+ VIEW COMPARISON:  None. FINDINGS: There is osteopenia which limits detection of nondisplaced fractures. There are advanced degenerative changes of the first carpometacarpal joint. There is no acute displaced fracture or dislocation. There is a probable old fracture of the ulnar styloid process. Degenerative changes are noted of the wrist and interphalangeal joints. IMPRESSION: 1. No acute displaced fracture or dislocation. 2. Advanced degenerative changes of the first carpometacarpal joint. 3. Osteopenia. Electronically Signed   By: Constance Holster M.D.   On: 06/09/2020 03:31   DG HIP UNILAT WITH PELVIS 2-3 VIEWS LEFT  Result Date: 06/09/2020 CLINICAL DATA:  Pain status post fall EXAM: DG HIP (WITH OR WITHOUT PELVIS) 2-3V LEFT; DG HIP (WITH OR WITHOUT PELVIS) 2-3V RIGHT COMPARISON:  CT dated 11/16/2019. FINDINGS: There is diffuse osteopenia which limits  detection of nondisplaced fractures. There are moderate degenerative changes of both hips. There is no acute displaced fracture or dislocation. Vascular calcifications are noted. IMPRESSION: 1. Diffuse osteopenia which limits detection of nondisplaced fractures. 2. No acute displaced fracture or dislocation. 3. Moderate degenerative changes of both hips. Electronically Signed   By: Constance Holster M.D.   On: 06/09/2020 01:30   DG HIP UNILAT WITH PELVIS 2-3 VIEWS RIGHT  Result Date: 06/09/2020 CLINICAL DATA:  Pain status post fall EXAM: DG HIP (WITH OR WITHOUT PELVIS) 2-3V LEFT; DG HIP (WITH OR WITHOUT PELVIS) 2-3V RIGHT COMPARISON:  CT dated 11/16/2019. FINDINGS: There is diffuse osteopenia which limits detection of nondisplaced fractures. There are moderate degenerative changes of both hips. There is no acute displaced fracture or dislocation. Vascular calcifications are noted. IMPRESSION: 1. Diffuse osteopenia which limits detection of nondisplaced fractures. 2. No acute displaced fracture or dislocation. 3. Moderate degenerative changes of both hips. Electronically Signed   By: Constance Holster M.D.   On: 06/09/2020 01:30    Radiology No results found.  Procedures Procedures   Medications Ordered in ED Medications  bacitracin ointment (1 application Topical Given 06/09/20 0156)  cefTRIAXone (ROCEPHIN) 1 g in sodium chloride 0.9 % 100 mL IVPB (has no administration in time range)  Tdap (BOOSTRIX) injection 0.5 mL (0.5 mLs Intramuscular Given 06/09/20 0153)  amLODipine (NORVASC) tablet 10 mg (10 mg Oral Given 06/09/20 0209)  acetaminophen (TYLENOL) tablet 1,000 mg (1,000 mg Oral Given 06/09/20 0152)    Finger laceration is superficial and does not require a stitch.  Skin tears cannot be closed. Wound care provided.  Will start antibiotic ointment.  Wrist fracture splinted.  Pain medication prescribed.  Will refer to hand surgery for definitive care.    Chenelle Benning was evaluated in  Emergency Department on 06/09/2020 for the symptoms described in the history of present illness. She was evaluated in the context of the global COVID-19 pandemic, which necessitated consideration that the patient might be at risk for infection with the SARS-CoV-2 virus that causes COVID-19. Institutional protocols and algorithms that pertain to the evaluation of patients at risk for COVID-19 are in a state of rapid change based on information released by regulatory bodies including the CDC and federal and state organizations. These policies and algorithms were followed during the patient's care in the ED.  ED Course  I have reviewed the triage vital signs and the nursing notes.  Pertinent labs & imaging results that were available during my care of the patient were reviewed by me and considered in my medical decision making (see chart for details).  Candis Kabel was evaluated in Emergency Department on 06/09/2020 for the symptoms described in the history of present illness. She was evaluated in the context of the global COVID-19 pandemic, which necessitated consideration that the patient might be at risk for infection with the SARS-CoV-2 virus that causes COVID-19. Institutional protocols and algorithms that pertain to the evaluation of patients at risk for COVID-19 are in a state of rapid change based on information released by regulatory bodies including the CDC and federal and state organizations. These policies and algorithms were followed during the patient's care in the ED.  Final Clinical Impression(s) / ED Diagnoses Final diagnoses:  Fall, initial encounter   Given white count, fall, hypothermia and UTI I will admit to medicine.  Patient has been on the [facility] but has now successfully come off and is only under warm blankets.     Palumbo, April, MD 06/09/20 [phone]

## 2020-06-09 NOTE — ED Notes (Signed)
Chart gave me an alert that pt has a "legal guardian" but no information found on who the legal guardian is.  Per daughter and caretaker son makes decisions, called [employer] and left one message.  Also called daughter in law and left message.  Spoke with daughter.  Await call back.

## 2020-06-09 NOTE — Evaluation (Addendum)
Physical Therapy Evaluation Patient Details Name: Cindy [name] MRN: [id number] DOB: 11-22-1922 Today's Date: 06/09/2020   History of Present Illness  Pt is a 87 y/o female admitted after fall. Found to have L distal radius fx. PMH includes HTN, PE, CKD, CAD, and GERD.  Clinical Impression  Pt admitted secondary to problem above with deficits below. Pt requiring min A to come to sitting. Upon sitting, pt dizzy; BP checked and at 94/59. Returned to supine. Pt with notable memory deficits throughout. Does report she has an aide from when she wakes up to when she goes to bed. States the aide can provide 24/7 if needed. Will need to ensure 24/7 assist at d/c. Pt reports she uses RW, but given NWB on LUE, will need to progress mobility to determine safest DME for mobility at d/c. Will continue to follow acutely.     Follow Up Recommendations Home health PT;Supervision/Assistance - 24 hour (vs SNF pending progression)    Equipment Recommendations  Other (comment) (TBD pending progression)    Recommendations for Other Services       Precautions / Restrictions Precautions Precautions: Fall Restrictions Weight Bearing Restrictions: Yes LUE Weight Bearing: Non weight bearing      Mobility  Bed Mobility Overal bed mobility: Needs Assistance Bed Mobility: Supine to Sit;Sit to Supine;Rolling Rolling: Min assist   Supine to sit: Min assist Sit to supine: Min guard   General bed mobility comments: Min A for trunk elevation to come to sitting. Pt reporting dizziness upon sitting and BP at 94/59. Further mobility deferred. Was able to roll from side to side with min A for clean up as bed was wet.    Transfers                    Ambulation/Gait                Stairs            Wheelchair Mobility    Modified Rankin (Stroke Patients Only)       Balance Overall balance assessment: Needs assistance Sitting-balance support: No upper extremity supported Sitting  balance-Leahy Scale: Fair                                       Pertinent Vitals/Pain Pain Assessment: Faces Faces Pain Scale: Hurts little more Pain Location: R shoulder and L wrist Pain Descriptors / Indicators: Grimacing;Guarding Pain Intervention(s): Limited activity within patient's tolerance;Monitored during session;Repositioned    Home Living Family/patient expects to be discharged to:: Private residence Living Arrangements: Alone Available Help at Discharge: Personal care attendant;Available 24 hours/day (states aide is normally there from early AM until when she goes to bed; can stay 24/7 if needed.) Type of Home: House Home Access: Ramped entrance;Stairs to enter Entrance Stairs-Rails: Right Entrance Stairs-Number of Steps: 4 Home Layout: One level Home Equipment: Shower seat;Cane - single point;Walker - 2 wheels;Wheelchair - manual      Prior Function Level of Independence: Needs assistance   Gait / Transfers Assistance Needed: supervision for ambulation with use of RW  ADL's / Homemaking Assistance Needed: assist for bathing/dressing as needed by caregiver. [name] also completes all IADLs including housekeeping, laundry and cooking, and provides transportation.  Pt has a medical alert bracelet when she is home alone.        Hand Dominance        Extremity/Trunk Assessment  Upper Extremity Assessment Upper Extremity Assessment: LUE deficits/detail;Defer to OT evaluation LUE Deficits / Details: LUE in splint    Lower Extremity Assessment Lower Extremity Assessment: Generalized weakness (Pt reports she has "bad knees" at baseline.)    Cervical / Trunk Assessment Cervical / Trunk Assessment: Kyphotic  Communication   Communication: No difficulties  Cognition Arousal/Alertness: Awake/alert Behavior During Therapy: WFL for tasks assessed/performed Overall Cognitive Status: No family/caregiver present to determine baseline cognitive  functioning                                 General Comments: Memory deficits noted as pt very repetitive throughout session.      General Comments      Exercises     Assessment/Plan    PT Assessment Patient needs continued PT services  PT Problem List Decreased strength;Decreased activity tolerance;Decreased balance;Decreased mobility;Decreased knowledge of use of DME;Decreased cognition;Decreased safety awareness;Decreased knowledge of precautions       PT Treatment Interventions DME instruction;Stair training;Gait training;Functional mobility training;Therapeutic activities;Therapeutic exercise;Balance training;Patient/family education;Cognitive remediation    PT Goals (Current goals can be found in the Care Plan section)  Acute Rehab PT Goals Patient Stated Goal: to go home PT Goal Formulation: With patient Time For Goal Achievement: 06/23/20 Potential to Achieve Goals: Fair    Frequency Min 3X/week   Barriers to discharge        Co-evaluation               AM-PAC PT "6 Clicks" Mobility  Outcome Measure Help needed turning from your back to your side while in a flat bed without using bedrails?: A Little Help needed moving from lying on your back to sitting on the side of a flat bed without using bedrails?: A Little Help needed moving to and from a bed to a chair (including a wheelchair)?: A Little Help needed standing up from a chair using your arms (e.g., wheelchair or bedside chair)?: A Little Help needed to walk in hospital room?: A Lot Help needed climbing 3-5 steps with a railing? : A Lot 6 Click Score: 16    End of Session   Activity Tolerance: Treatment limited secondary to medical complications (Comment) (low BP) Patient left: in bed;with call bell/phone within reach;with bed alarm set Nurse Communication: Mobility status PT Visit Diagnosis: Other abnormalities of gait and mobility (R26.89);History of falling (Z91.81);Repeated falls  (R29.6)    Time: 8469-6295 PT Time Calculation (min) (ACUTE ONLY): 28 min   Charges:   PT Evaluation $PT Eval Moderate Complexity: 1 Mod PT Treatments $Therapeutic Activity: 8-22 mins        Lou Miner, DPT  Acute Rehabilitation Services  Pager: [phone] Office: [phone]   Rudean Hitt 06/09/2020, 2:22 PM

## 2020-06-09 NOTE — ED Notes (Signed)
Pt caretaker has left and advised to call him if she is d/c.  Pt does not have a legal guardian but son is POA

## 2020-06-09 NOTE — Progress Notes (Signed)
Orthopedic Tech Progress Note Patient Details:  Cindy [name] 03/23/1923 [id number]  Ortho Devices Type of Ortho Device: Thumb spica splint Splint Material: Fiberglass Ortho Device/Splint Location: lue Ortho Device/Splint Interventions: Ordered,Application,Adjustment   Post Interventions Patient Tolerated: Well Instructions Provided: Care of device,Adjustment of device   Karolee Stamps 06/09/2020, 3:18 AM

## 2020-06-09 NOTE — Plan of Care (Signed)
  Problem: Clinical Measurements: Goal: Diagnostic test results will improve Outcome: Progressing   Problem: Pain Managment: Goal: General experience of comfort will improve Outcome: Progressing   Problem: Safety: Goal: Ability to remain free from injury will improve Outcome: Progressing   Problem: Skin Integrity: Goal: Risk for impaired skin integrity will decrease Outcome: Progressing   

## 2020-06-09 NOTE — Progress Notes (Signed)
PROGRESS NOTE                                                                                                                                                                                                             Patient Demographics:    Cindy [name], is a 87 y.o. female, DOB - 1922-04-16, KGU:[id number]  Outpatient Primary MD for the patient is Altheimer, Legrand Como, MD    LOS - 0  Admit date - 06/09/2020    Chief Complaint  Patient presents with   Fall       Brief Narrative (HPI from H&P)  - 87 year old Caucasian female with history of PE in 11/30/2019 on Eliquis, hypertension, CKD stage IIIa, CAD, GERD, hypothyroidism who has had multiple falls at home was brought in to the ER after she sustained another mechanical fall in her garage with lot of facial injury and bruising.   Subjective:    [name] today has, No headache, No chest pain, No abdominal pain - No Nausea, No new weakness tingling or numbness, no cough or shortness of breath.   Assessment  & Plan :     1.  Mechanical fall with left radial fracture along with soft tissue facial injury and bruising.  Overall stable, will be seen by PT-OT, she is adamant that she wants to go back home and not to a nursing home.  Also discussed the plan with patient's son and explained my concerns of multiple falls at home with her being on Eliquis.  For now they want her to go back home and then reevaluate the situation in a few days.  2.  Mechanical fall with left radial fracture.  Currently in splint.  Orthopedics to see.  3.  History of PE in 11/30/2019.  Hold Eliquis for 48 hours due to his extensive facial bruising, resume with caution.  4.  CKD stage IIIa.  Creatinine close to baseline after hydration with IV fluids.  5.  UTI.  Placed on Rocephin.  6.  Hypothyroidism.  On home dose Synthroid.  7.  Essential hypertension.  For now continue home regimen, BP  seems to be quite labile will monitor closely.      Condition - Extremely Guarded  Family Communication  :  Son [name] [phone] - 06/09/20  Code Status :  DNR  Consults  :  None  PUD Prophylaxis : None   Procedures  :     CT abdomen-pelvis, head and C-spine - nonacute.      Disposition Plan  :    Status is: Observation  The patient remains OBS appropriate and will d/c before 2 midnights.  Dispo: The patient is from: Home              Anticipated d/c is to: Home              Patient currently is not medically stable to d/c.   Difficult to place patient No   DVT Prophylaxis  :  SCDs - Eliquis  Lab Results  Component Value Date   PLT 259 06/09/2020    Diet :  Diet Order            Diet Heart Room service appropriate? No; Fluid consistency: Thin  Diet effective now                  Inpatient Medications  Scheduled Meds:  apixaban  2.5 mg Oral BID   bacitracin   Topical BID   carvedilol  6.25 mg Oral BID WC   hydroxypropyl methylcellulose / hypromellose  1 drop Both Eyes Daily   levothyroxine  75 mcg Oral Q0600   pantoprazole  40 mg Oral Daily   polyethylene glycol  17 g Oral Daily   simvastatin  20 mg Oral QPM   Continuous Infusions:  sodium chloride 100 mL/hr at 06/09/20 0723   [START ON 06/10/2020] cefTRIAXone (ROCEPHIN)  IV     PRN Meds:.acetaminophen **OR** acetaminophen, hydrALAZINE, [DISCONTINUED] ondansetron **OR** ondansetron (ZOFRAN) IV, polyethylene glycol, traMADol **OR** traMADol  Antibiotics  :    Anti-infectives (From admission, onward)   Start     Dose/Rate Route Frequency Ordered Stop   06/10/20 0800  cefTRIAXone (ROCEPHIN) 1 g in sodium chloride 0.9 % 100 mL IVPB        1 g 200 mL/hr over 30 Minutes Intravenous Every 24 hours 06/09/20 0729 06/13/20 0759   06/09/20 0415  cefTRIAXone (ROCEPHIN) 1 g in sodium chloride 0.9 % 100 mL IVPB        1 g 200 mL/hr over 30 Minutes Intravenous  Once 06/09/20 0410 06/09/20 0520        Time Spent in minutes  30   Lala Lund M.D on 06/09/2020 at 12:50 PM  To page go to [email or web address]   [facility] -  Office  [phone]    See all Orders from today for further details    Objective:   Vitals:   06/09/20 0630 06/09/20 0645 06/09/20 0721 06/09/20 1155  BP: (!) 137/101  (!) 184/157 (!) 94/59  Pulse: 72 64 75 62  Resp: (!) 24 19 20 16   Temp:   97.6 F (36.4 C) 97.9 F (36.6 C)  TempSrc:   Oral Oral  SpO2: 91% 94% 93% 92%  Weight:        Wt Readings from Last 3 Encounters:  06/09/20 44.9 kg  01/10/20 45.3 kg  01/04/20 47.4 kg     Intake/Output Summary (Last 24 hours) at 06/09/2020 1250 Last data filed at 06/09/2020 0520 Gross per 24 hour  Intake 100 ml  Output --  Net 100 ml     Physical Exam  Awake Alert, No new F.N deficits, Normal affect [place].AT, facial laceration and bruising stable, left arm in a splint Supple Neck,No JVD, No cervical lymphadenopathy appriciated.  Symmetrical Chest wall movement, Good air movement  bilaterally, CTAB RRR,No Gallops,Rubs or new Murmurs, No Parasternal Heave +ve B.Sounds, Abd Soft, No tenderness, No organomegaly appriciated, No rebound - guarding or rigidity. No Cyanosis, Clubbing or edema,      Data Review:    CBC Recent Labs  Lab 06/09/20 0252 06/09/20 0342 06/09/20 0734  WBC 12.8*  --  11.3*  HGB 13.4 13.9 12.9  HCT 41.3 41.0 38.8  PLT 273  --  259  MCV 97.6  --  96.3  MCH 31.7  --  32.0  MCHC 32.4  --  33.2  RDW 12.6  --  12.6  LYMPHSABS 1.0  --  1.6  MONOABS 0.9  --  1.1*  EOSABS 0.1  --  0.1  BASOSABS 0.0  --  0.0    Recent Labs  Lab 06/09/20 0342 06/09/20 0734  NA 141 141  K 4.3 4.3  CL 107 107  CO2  --  23  GLUCOSE 114* 109*  BUN 30* 22  CREATININE 1.30* 1.20*  CALCIUM  --  9.3  AST  --  20  ALT  --  15  ALKPHOS  --  48  BILITOT  --  0.6  ALBUMIN  --  3.3*  MG  --  1.7  INR  --  1.2     ------------------------------------------------------------------------------------------------------------------ No results for input(s): CHOL, HDL, LDLCALC, TRIG, CHOLHDL, LDLDIRECT in the last 72 hours.  Lab Results  Component Value Date   HGBA1C 6.0 (H) 03/06/2019   ------------------------------------------------------------------------------------------------------------------ No results for input(s): TSH, T4TOTAL, T3FREE, THYROIDAB in the last 72 hours.  Invalid input(s): FREET3  Cardiac Enzymes No results for input(s): CKMB, TROPONINI, MYOGLOBIN in the last 168 hours.  Invalid input(s): CK ------------------------------------------------------------------------------------------------------------------    Component Value Date/Time   BNP 409.5 (H) 11/17/2019 0756    Micro Results Recent Results (from the past 240 hour(s))  Resp Panel by RT-PCR (Flu A&B, Covid) Nasopharyngeal Swab     Status: None   Collection Time: 06/09/20  2:52 AM   Specimen: Nasopharyngeal Swab; Nasopharyngeal(NP) swabs in vial transport medium  Result Value Ref Range Status   SARS Coronavirus 2 by RT PCR NEGATIVE NEGATIVE Final    Comment: (NOTE) SARS-CoV-2 target nucleic acids are NOT DETECTED.  The SARS-CoV-2 RNA is generally detectable in upper respiratory specimens during the acute phase of infection. The lowest concentration of SARS-CoV-2 viral copies this assay can detect is 138 copies/mL. A negative result does not preclude SARS-Cov-2 infection and should not be used as the sole basis for treatment or other patient management decisions. A negative result may occur with  improper specimen collection/handling, submission of specimen other than nasopharyngeal swab, presence of viral mutation(s) within the areas targeted by this assay, and inadequate number of viral copies(<138 copies/mL). A negative result must be combined with clinical observations, patient history, and  epidemiological information. The expected result is Negative.  Fact Sheet for Patients:  EntrepreneurPulse.com.au  Fact Sheet for Healthcare Providers:  IncredibleEmployment.be  This test is no t yet approved or cleared by the [place] FDA and  has been authorized for detection and/or diagnosis of SARS-CoV-2 by FDA under an Emergency Use Authorization (EUA). This EUA will remain  in effect (meaning this test can be used) for the duration of the COVID-19 declaration under Section 564(b)(1) of the Act, 21 U.S.C.section 360bbb-3(b)(1), unless the authorization is terminated  or revoked sooner.       Influenza A by PCR NEGATIVE NEGATIVE Final   Influenza B by PCR NEGATIVE NEGATIVE  Final    Comment: (NOTE) The Xpert Xpress SARS-CoV-2/FLU/RSV plus assay is intended as an aid in the diagnosis of influenza from Nasopharyngeal swab specimens and should not be used as a sole basis for treatment. Nasal washings and aspirates are unacceptable for Xpert Xpress SARS-CoV-2/FLU/RSV testing.  Fact Sheet for Patients: EntrepreneurPulse.com.au  Fact Sheet for Healthcare Providers: IncredibleEmployment.be  This test is not yet approved or cleared by the [place] FDA and has been authorized for detection and/or diagnosis of SARS-CoV-2 by FDA under an Emergency Use Authorization (EUA). This EUA will remain in effect (meaning this test can be used) for the duration of the COVID-19 declaration under Section 564(b)(1) of the Act, 21 U.S.C. section 360bbb-3(b)(1), unless the authorization is terminated or revoked.  Performed at [facility], [place] [address] [place], [place] [address]     Radiology Reports DG Chest 1 View  Result Date: 06/09/2020 CLINICAL DATA:  Pain status post fall EXAM: CHEST  1 VIEW COMPARISON:  01/04/2020 FINDINGS: Again noted is chronic elevation of the left hemidiaphragm. The heart  size appears stable. There is no pneumothorax. No large pleural effusion. Atelectasis is noted at the lung bases, left greater than right. There is no acute osseous abnormality. Aortic calcifications are noted. IMPRESSION: No active disease. Electronically Signed   By: Constance Holster M.D.   On: 06/09/2020 01:31   DG Wrist Complete Left  Result Date: 06/09/2020 CLINICAL DATA:  Fall EXAM: LEFT WRIST - COMPLETE 3+ VIEW COMPARISON:  None. FINDINGS: Comminuted impacted intra-articular distal radius fracture is seen. There is a mildly displaced ulnar styloid fracture. Dorsal soft tissue swelling is noted. Advanced first [facility] joint osteoarthritis is noted. There is diffuse osteopenia which somewhat limits evaluation of the carpals. IMPRESSION: Comminuted impacted intra-articular distal radius fracture. Mildly displaced ulnar styloid fracture. Electronically Signed   By: Prudencio Pair M.D.   On: 06/09/2020 01:23   CT Head Wo Contrast  Result Date: 06/09/2020 CLINICAL DATA:  Fall EXAM: CT HEAD WITHOUT CONTRAST TECHNIQUE: Contiguous axial images were obtained from the base of the skull through the vertex without intravenous contrast. COMPARISON:  None. FINDINGS: Brain: No evidence of acute territorial infarction, hemorrhage, hydrocephalus,extra-axial collection or mass lesion/mass effect. There is dilatation the ventricles and sulci consistent with age-related atrophy. Low-attenuation changes in the deep white matter consistent with small vessel ischemia. Vascular: No hyperdense vessel or unexpected calcification. Skull: The skull is intact. No fracture or focal lesion identified. Sinuses/Orbits: The visualized paranasal sinuses and mastoid air cells are clear. The orbits and globes intact. Other: None Cervical spine: Alignment: There is a minimal anterolisthesis of C3 on C4. Skull base and vertebrae: Visualized skull base is intact. No atlanto-occipital dissociation. Capsular hypertrophy is seen at the atlantoaxial  articulation causing mild canal narrowing. The vertebral body heights are well maintained. No fracture or pathologic osseous lesion seen. Soft tissues and spinal canal: The visualized paraspinal soft tissues are unremarkable. No prevertebral soft tissue swelling is seen. The spinal canal is grossly unremarkable, no large epidural collection or significant canal narrowing. Disc levels: Multilevel cervical spine spondylosis is seen with disc osteophyte complex and uncovertebral osteophytes most notable at C4-C5 with moderate central canal stenosis and severe bilateral neural foraminal narrowing. For Upper chest: Mild biapical scarring is noted. Thoracic inlet is within normal limits. Other: None IMPRESSION: No acute intracranial abnormality. Findings consistent with age related atrophy and chronic small vessel ischemia No acute fracture or malalignment of the spine. Electronically Signed   By: Kerby Moors  Avutu M.D.   On: 06/09/2020 01:06   CT Cervical Spine Wo Contrast  Result Date: 06/09/2020 CLINICAL DATA:  Fall EXAM: CT HEAD WITHOUT CONTRAST TECHNIQUE: Contiguous axial images were obtained from the base of the skull through the vertex without intravenous contrast. COMPARISON:  None. FINDINGS: Brain: No evidence of acute territorial infarction, hemorrhage, hydrocephalus,extra-axial collection or mass lesion/mass effect. There is dilatation the ventricles and sulci consistent with age-related atrophy. Low-attenuation changes in the deep white matter consistent with small vessel ischemia. Vascular: No hyperdense vessel or unexpected calcification. Skull: The skull is intact. No fracture or focal lesion identified. Sinuses/Orbits: The visualized paranasal sinuses and mastoid air cells are clear. The orbits and globes intact. Other: None Cervical spine: Alignment: There is a minimal anterolisthesis of C3 on C4. Skull base and vertebrae: Visualized skull base is intact. No atlanto-occipital dissociation. Capsular  hypertrophy is seen at the atlantoaxial articulation causing mild canal narrowing. The vertebral body heights are well maintained. No fracture or pathologic osseous lesion seen. Soft tissues and spinal canal: The visualized paraspinal soft tissues are unremarkable. No prevertebral soft tissue swelling is seen. The spinal canal is grossly unremarkable, no large epidural collection or significant canal narrowing. Disc levels: Multilevel cervical spine spondylosis is seen with disc osteophyte complex and uncovertebral osteophytes most notable at C4-C5 with moderate central canal stenosis and severe bilateral neural foraminal narrowing. For Upper chest: Mild biapical scarring is noted. Thoracic inlet is within normal limits. Other: None IMPRESSION: No acute intracranial abnormality. Findings consistent with age related atrophy and chronic small vessel ischemia No acute fracture or malalignment of the spine. Electronically Signed   By: Prudencio Pair M.D.   On: 06/09/2020 01:06   CT PELVIS WO CONTRAST  Result Date: 06/09/2020 CLINICAL DATA:  Acute pain due to trauma EXAM: CT PELVIS WITHOUT CONTRAST TECHNIQUE: Multidetector CT imaging of the pelvis was performed following the standard protocol without intravenous contrast. COMPARISON:  None. FINDINGS: Urinary Tract:  No abnormality visualized. Bowel: There is diffuse sigmoid diverticulosis without CT evidence for diverticulitis. Vascular/Lymphatic: Vascular calcifications are noted of the visualized abdominal aorta. There are no pathologically enlarged lymph nodes. Reproductive:  The patient is status post prior hysterectomy. Other:  There are bilateral fat containing inguinal hernias. Musculoskeletal: There are advanced degenerative changes at the L4-L5 level resulting in a grade 1-2 anterolisthesis of L4 on L5. There is a small fluid collection lateral to the greater trochanter of the proximal left femur. This may represent a small hematoma. There is no acute  displaced fracture. However, evaluation is limited by osteopenia. IMPRESSION: 1. No acute displaced fracture. However, evaluation is limited by osteopenia. 2. Small fluid collection lateral to the greater trochanter of the proximal left femur. This may represent a small hematoma. 3. There are advanced degenerative changes of the lower lumbar spine as detailed above. Aortic Atherosclerosis (ICD10-I70.0). Electronically Signed   By: Constance Holster M.D.   On: 06/09/2020 02:46   DG Hand Complete Right  Result Date: 06/09/2020 CLINICAL DATA:  Pain EXAM: RIGHT HAND - COMPLETE 3+ VIEW COMPARISON:  None. FINDINGS: There is osteopenia which limits detection of nondisplaced fractures. There are advanced degenerative changes of the first carpometacarpal joint. There is no acute displaced fracture or dislocation. There is a probable old fracture of the ulnar styloid process. Degenerative changes are noted of the wrist and interphalangeal joints. IMPRESSION: 1. No acute displaced fracture or dislocation. 2. Advanced degenerative changes of the first carpometacarpal joint. 3. Osteopenia. Electronically Signed   By:  Constance Holster M.D.   On: 06/09/2020 03:31   DG HIP UNILAT WITH PELVIS 2-3 VIEWS LEFT  Result Date: 06/09/2020 CLINICAL DATA:  Pain status post fall EXAM: DG HIP (WITH OR WITHOUT PELVIS) 2-3V LEFT; DG HIP (WITH OR WITHOUT PELVIS) 2-3V RIGHT COMPARISON:  CT dated 11/16/2019. FINDINGS: There is diffuse osteopenia which limits detection of nondisplaced fractures. There are moderate degenerative changes of both hips. There is no acute displaced fracture or dislocation. Vascular calcifications are noted. IMPRESSION: 1. Diffuse osteopenia which limits detection of nondisplaced fractures. 2. No acute displaced fracture or dislocation. 3. Moderate degenerative changes of both hips. Electronically Signed   By: Constance Holster M.D.   On: 06/09/2020 01:30   DG HIP UNILAT WITH PELVIS 2-3 VIEWS RIGHT  Result  Date: 06/09/2020 CLINICAL DATA:  Pain status post fall EXAM: DG HIP (WITH OR WITHOUT PELVIS) 2-3V LEFT; DG HIP (WITH OR WITHOUT PELVIS) 2-3V RIGHT COMPARISON:  CT dated 11/16/2019. FINDINGS: There is diffuse osteopenia which limits detection of nondisplaced fractures. There are moderate degenerative changes of both hips. There is no acute displaced fracture or dislocation. Vascular calcifications are noted. IMPRESSION: 1. Diffuse osteopenia which limits detection of nondisplaced fractures. 2. No acute displaced fracture or dislocation. 3. Moderate degenerative changes of both hips. Electronically Signed   By: Constance Holster M.D.   On: 06/09/2020 01:30

## 2020-06-10 DIAGNOSIS — Y92009 Unspecified place in unspecified non-institutional (private) residence as the place of occurrence of the external cause: Secondary | ICD-10-CM | POA: Diagnosis not present

## 2020-06-10 DIAGNOSIS — W19XXXA Unspecified fall, initial encounter: Secondary | ICD-10-CM | POA: Diagnosis not present

## 2020-06-10 DIAGNOSIS — I10 Essential (primary) hypertension: Secondary | ICD-10-CM | POA: Diagnosis not present

## 2020-06-10 DIAGNOSIS — S52502A Unspecified fracture of the lower end of left radius, initial encounter for closed fracture: Secondary | ICD-10-CM | POA: Diagnosis not present

## 2020-06-10 LAB — CBC
HCT: 33.8 % — ABNORMAL LOW (ref 36.0–46.0)
Hemoglobin: 11.7 g/dL — ABNORMAL LOW (ref 12.0–15.0)
MCH: 32.9 pg (ref 26.0–34.0)
MCHC: 34.6 g/dL (ref 30.0–36.0)
MCV: 94.9 fL (ref 80.0–100.0)
Platelets: 221 10*3/uL (ref 150–400)
RBC: 3.56 MIL/uL — ABNORMAL LOW (ref 3.87–5.11)
RDW: 13 % (ref 11.5–15.5)
WBC: 8.5 10*3/uL (ref 4.0–10.5)
nRBC: 0 % (ref 0.0–0.2)

## 2020-06-10 LAB — COMPREHENSIVE METABOLIC PANEL
ALT: 11 U/L (ref 0–44)
AST: 20 U/L (ref 15–41)
Albumin: 2.8 g/dL — ABNORMAL LOW (ref 3.5–5.0)
Alkaline Phosphatase: 41 U/L (ref 38–126)
Anion gap: 10 (ref 5–15)
BUN: 21 mg/dL (ref 8–23)
CO2: 18 mmol/L — ABNORMAL LOW (ref 22–32)
Calcium: 8.8 mg/dL — ABNORMAL LOW (ref 8.9–10.3)
Chloride: 111 mmol/L (ref 98–111)
Creatinine, Ser: 1.22 mg/dL — ABNORMAL HIGH (ref 0.44–1.00)
GFR, Estimated: 41 mL/min — ABNORMAL LOW (ref >60)
Glucose, Bld: 92 mg/dL (ref 70–99)
Potassium: 4.3 mmol/L (ref 3.5–5.1)
Sodium: 139 mmol/L (ref 135–145)
Total Bilirubin: 0.5 mg/dL (ref 0.3–1.2)
Total Protein: 5.6 g/dL — ABNORMAL LOW (ref 6.5–8.1)

## 2020-06-10 MED ORDER — CEPHALEXIN 500 MG PO CAPS
500.0000 mg | ORAL_CAPSULE | Freq: Three times a day (TID) | ORAL | 0 refills | Status: AC
Start: 2020-06-10 — End: 2020-06-13

## 2020-06-10 MED ORDER — APIXABAN 2.5 MG PO TABS: 2.5000 mg | ORAL_TABLET | Freq: Two times a day (BID) | ORAL | 0 refills | Status: DC

## 2020-06-10 MED ORDER — DOXYCYCLINE HYCLATE 100 MG PO CAPS: 100.0000 mg | ORAL_CAPSULE | Freq: Two times a day (BID) | ORAL | 0 refills | Status: DC

## 2020-06-10 NOTE — Discharge Summary (Addendum)
Cindy [name] KYH:[id number] DOB: [birth year] DOA: 06/09/2020  PCP: Lorne Skeens, MD  Admit date: 06/09/2020  Discharge date: 06/10/2020  Admitted From: Home   Disposition:  Home   Recommendations for Outpatient Follow-up:   Follow up with PCP in 1-2 weeks  PCP Please obtain BMP/CBC, 2 view CXR in 1week,  (see Discharge instructions)   PCP Please follow up on the following pending results: Examine facial bruises and laceration within 7 to 10 days, outpatient orthopedics follow-up   Home Health: PT,RN  Equipment/Devices: Roling walker Consultations: Ortho Discharge Condition: Stable    CODE STATUS: Full    Diet Recommendation: Heart Healthy   Diet Order            Diet - low sodium heart healthy           Diet Heart Room service appropriate? No; Fluid consistency: Thin  Diet effective now                  Chief Complaint  Patient presents with  . Fall     Brief history of present illness from the day of admission and additional interim summary    87 year old Caucasian female with history of PE in 11/30/2019 on Eliquis, hypertension, CKD stage IIIa, CAD, GERD, hypothyroidism who has had multiple falls at home was brought in to the ER after she sustained another mechanical fall in her garage with lot of facial injury and bruising.                                                                 Hospital Course    1.  Mechanical fall with left radial fracture along with soft tissue facial injury and bruising.  Overall stable, was seen by PT-OT, she is adamant that she wants to go back home and not to a nursing home.  Also discussed the plan with patient's son and explained my concerns of multiple falls at home with her being on Eliquis.   Clinically she is back to her baseline will be discharged home  with outpatient PCP follow-up, resume Eliquis from 06/11/2020, request PCP to examine her facial injury and bruises in 7 to 10 days.  2.  Mechanical fall with left radial fracture.  Currently in splint.  Orthopedics saw the patient, outpatient follow-up in 7 to 10 days.  3.  History of PE in 11/30/2019.  Hold Eliquis for 48 hours due to his extensive facial bruising, resume with caution on 06/11/2020.  4.  CKD stage IIIa.  Creatinine close to baseline after hydration with IV fluids.  5.  UTI.  Placed on Rocephin in the hospital will transition to 3 more days of oral Keflex upon discharge.  Addendum post discharge.  Clinically she has responded well to cephalosporin treatment however urine cultures post discharge  came back positive for staph aureus 100,000 colonies, likely colonization however will give 5 days of oral doxycycline along with Keflex for 3 days.  6.  Hypothyroidism.  On home dose Synthroid.  7.  Essential hypertension.  For now continue home regimen, follow with PCP.   Discharge diagnosis     Principal Problem:   Fall at home, initial encounter Active Problems:   Essential hypertension   Acute cystitis without hematuria   Pulmonary embolism ([place])   Hypothyroidism   Left wrist fracture, closed, initial encounter   Chronic kidney disease, stage 3a ([place])    Discharge instructions    Discharge Instructions    Diet - low sodium heart healthy   Complete by: As directed    Discharge instructions   Complete by: As directed    Follow with Primary MD Altheimer, Legrand Como, MD in 7 days   Get CBC, CMP, 2 view Chest X ray -  checked next visit within 1 week by Primary MD    Activity: As tolerated with Full fall precautions use walker/cane & assistance as needed, do not lift anything heavier than 1 pound with your left hand. Keep your facial skin clean and dry at all times.  Disposition Home    Diet: Heart Healthy   Special Instructions: If you have smoked or chewed  Tobacco  in the last 2 yrs please stop smoking, stop any regular Alcohol  and or any Recreational drug use.  On your next visit with your primary care physician please Get Medicines reviewed and adjusted.  Please request your Prim.MD to go over all Hospital Tests and Procedure/Radiological results at the follow up, please get all Hospital records sent to your Prim MD by signing hospital release before you go home.  If you experience worsening of your admission symptoms, develop shortness of breath, life threatening emergency, suicidal or homicidal thoughts you must seek medical attention immediately by calling 911 or calling your MD immediately  if symptoms less severe.  You Must read complete instructions/literature along with all the possible adverse reactions/side effects for all the Medicines you take and that have been prescribed to you. Take any new Medicines after you have completely understood and accpet all the possible adverse reactions/side effects.   Discharge wound care:   Complete by: As directed    Wound care to left side of face and neck, also to right index finger:  Cleanse with NS, pat gently dry. Apply folded piece of xeroform gauze Kellie Simmering # 294) to wounds, top with dry gauze and secure with paper tape. Change once daily and PRN dressing dislodgement or drainage strike-through.   Increase activity slowly   Complete by: As directed       Discharge Medications   Allergies as of 06/10/2020      Reactions   Fish Allergy Hives   Diclofenac Other (See Comments)   unknown   Ibuprofen Other (See Comments)   Affects kidney function   Sulfa Antibiotics    Unknown reaction      Medication List    TAKE these medications   acetaminophen 325 MG tablet Commonly known as: TYLENOL Take 1-2 tablets (325-650 mg total) by mouth every 6 (six) hours as needed for mild pain or moderate pain (pain score 1-3 or temp > 100.5). What changed:   how much to take  when to take this    apixaban 2.5 MG Tabs tablet Commonly known as: Eliquis Take 1 tablet (2.5 mg total) by mouth 2 (two) times  daily. Start taking on: June 11, 2020   B COMPLEX 100 PO Take 1 tablet by mouth daily.   cephALEXin 500 MG capsule Commonly known as: KEFLEX Take 1 capsule (500 mg total) by mouth 3 (three) times daily for 3 days.   CRANBERRY PO Take 1 tablet by mouth daily.   doxycycline 100 MG capsule Commonly known as: VIBRAMYCIN Take 1 capsule (100 mg total) by mouth 2 (two) times daily.   glucosamine-chondroitin 500-400 MG tablet Take 1 tablet by mouth daily.   hydrocortisone 2.5 % cream Apply 1 application topically 2 (two) times daily as needed (dry skin).   hydroxypropyl methylcellulose / hypromellose 2.5 % ophthalmic solution Commonly known as: ISOPTO TEARS / GONIOVISC Place 1 drop into both eyes daily.   levothyroxine 75 MCG tablet Commonly known as: SYNTHROID Take 75 mcg by mouth daily before breakfast.   lidocaine 5 % Commonly known as: LIDODERM Place 1 patch onto the skin daily. Remove & Discard patch within 12 hours or as directed by MD What changed: additional instructions   mirabegron ER 50 MG Tb24 tablet Commonly known as: MYRBETRIQ Take 50 mg by mouth daily.   mupirocin ointment 2 % Commonly known as: BACTROBAN Apply 1 application topically 2 (two) times daily.   Ocuvite Extra Tabs Take 1 tablet by mouth daily.   Hair Skin & Nails Advanced Tabs Take 3 tablets by mouth daily.   omega-3 acid ethyl esters 1 g capsule Commonly known as: LOVAZA Take 1 g by mouth daily.   omeprazole 20 MG capsule Commonly known as: PRILOSEC Take 20 mg by mouth daily.   polyethylene glycol 17 g packet Commonly known as: MiraLax Take 17 g by mouth daily.   simvastatin 20 MG tablet Commonly known as: ZOCOR Take 1 tablet (20 mg total) by mouth every evening.   solifenacin 10 MG tablet Commonly known as: VESICARE Take 10 mg by mouth daily.   traMADol 50 MG  tablet Commonly known as: ULTRAM Take 50 mg by mouth 2 (two) times daily as needed for moderate pain or severe pain. What changed: Another medication with the same name was added. Make sure you understand how and when to take each.   traMADol 50 MG tablet Commonly known as: ULTRAM Take 1 tablet (50 mg total) by mouth every 12 (twelve) hours as needed for severe pain. What changed: You were already taking a medication with the same name, and this prescription was added. Make sure you understand how and when to take each.   triamcinolone 0.1 % Commonly known as: KENALOG Apply 1 application topically every other day.   Triamcinolone Acetonide 0.025 % Lotn Apply 1 application topically every other day. For scalp   TURMERIC-GINGER PO Take 1 tablet by mouth daily.   Vitamin D 125 MCG (5000 UT) Caps Take 5,000 Units by mouth daily.            Durable Medical Equipment  (From admission, onward)         Start     Ordered   06/10/20 1053  For home use only DME Other see comment  Once       Comments: Left arm platform for rolling walker  Question:  Length of Need  Answer:  Lifetime   06/10/20 1052   06/10/20 0906  For home use only DME Walker rolling  Once       Comments: 5 wheel  Question Answer Comment  Walker: With 5 Inch Wheels   Patient needs a  walker to treat with the following condition Weakness      06/10/20 0905           Discharge Care Instructions  (From admission, onward)         Start     Ordered   06/10/20 0000  Discharge wound care:       Comments: Wound care to left side of face and neck, also to right index finger:  Cleanse with NS, pat gently dry. Apply folded piece of xeroform gauze Kellie Simmering # 294) to wounds, top with dry gauze and secure with paper tape. Change once daily and PRN dressing dislodgement or drainage strike-through.   06/10/20 0857           Follow-up Information    Altheimer, Legrand Como, MD. Schedule an appointment as soon as  possible for a visit in 1 week(s).   Specialty: Endocrinology Contact information: [place] [address] [phone]        Lorretta Harp, MD .   Specialties: Cardiology, Radiology Contact information: [address] [place] [address] [phone]        Roseanne Kaufman, MD. Schedule an appointment as soon as possible for a visit in 1 week(s).   Specialty: Orthopedic Surgery Contact information: [address] [place] [address] [place] [address] [phone]        [facility] Follow up.   Specialty: [place] Why: for continuation of Home Health Services Contact information: [place]  [address] [phone]               Major procedures and Radiology Reports - PLEASE review detailed and final reports thoroughly  -       DG Chest 1 View  Result Date: 06/09/2020 CLINICAL DATA:  Pain status post fall EXAM: CHEST  1 VIEW COMPARISON:  01/04/2020 FINDINGS: Again noted is chronic elevation of the left hemidiaphragm. The heart size appears stable. There is no pneumothorax. No large pleural effusion. Atelectasis is noted at the lung bases, left greater than right. There is no acute osseous abnormality. Aortic calcifications are noted. IMPRESSION: No active disease. Electronically Signed   By: Constance Holster M.D.   On: 06/09/2020 01:31   DG Wrist Complete Left  Result Date: 06/09/2020 CLINICAL DATA:  Fall EXAM: LEFT WRIST - COMPLETE 3+ VIEW COMPARISON:  None. FINDINGS: Comminuted impacted intra-articular distal radius fracture is seen. There is a mildly displaced ulnar styloid fracture. Dorsal soft tissue swelling is noted. Advanced first [facility] joint osteoarthritis is noted. There is diffuse osteopenia which somewhat limits evaluation of the carpals. IMPRESSION: Comminuted impacted intra-articular distal radius fracture. Mildly displaced ulnar styloid fracture.  Electronically Signed   By: Prudencio Pair M.D.   On: 06/09/2020 01:23   CT Head Wo Contrast  Result Date: 06/09/2020 CLINICAL DATA:  Fall EXAM: CT HEAD WITHOUT CONTRAST TECHNIQUE: Contiguous axial images were obtained from the base of the skull through the vertex without intravenous contrast. COMPARISON:  None. FINDINGS: Brain: No evidence of acute territorial infarction, hemorrhage, hydrocephalus,extra-axial collection or mass lesion/mass effect. There is dilatation the ventricles and sulci consistent with age-related atrophy. Low-attenuation changes in the deep white matter consistent with small vessel ischemia. Vascular: No hyperdense vessel or unexpected calcification. Skull: The skull is intact. No fracture or focal lesion identified. Sinuses/Orbits: The visualized paranasal sinuses and mastoid air cells are clear. The orbits and globes intact. Other: None Cervical spine: Alignment: There is  a minimal anterolisthesis of C3 on C4. Skull base and vertebrae: Visualized skull base is intact. No atlanto-occipital dissociation. Capsular hypertrophy is seen at the atlantoaxial articulation causing mild canal narrowing. The vertebral body heights are well maintained. No fracture or pathologic osseous lesion seen. Soft tissues and spinal canal: The visualized paraspinal soft tissues are unremarkable. No prevertebral soft tissue swelling is seen. The spinal canal is grossly unremarkable, no large epidural collection or significant canal narrowing. Disc levels: Multilevel cervical spine spondylosis is seen with disc osteophyte complex and uncovertebral osteophytes most notable at C4-C5 with moderate central canal stenosis and severe bilateral neural foraminal narrowing. For Upper chest: Mild biapical scarring is noted. Thoracic inlet is within normal limits. Other: None IMPRESSION: No acute intracranial abnormality. Findings consistent with age related atrophy and chronic small vessel ischemia No acute fracture or  malalignment of the spine. Electronically Signed   By: Prudencio Pair M.D.   On: 06/09/2020 01:06   CT Cervical Spine Wo Contrast  Result Date: 06/09/2020 CLINICAL DATA:  Fall EXAM: CT HEAD WITHOUT CONTRAST TECHNIQUE: Contiguous axial images were obtained from the base of the skull through the vertex without intravenous contrast. COMPARISON:  None. FINDINGS: Brain: No evidence of acute territorial infarction, hemorrhage, hydrocephalus,extra-axial collection or mass lesion/mass effect. There is dilatation the ventricles and sulci consistent with age-related atrophy. Low-attenuation changes in the deep white matter consistent with small vessel ischemia. Vascular: No hyperdense vessel or unexpected calcification. Skull: The skull is intact. No fracture or focal lesion identified. Sinuses/Orbits: The visualized paranasal sinuses and mastoid air cells are clear. The orbits and globes intact. Other: None Cervical spine: Alignment: There is a minimal anterolisthesis of C3 on C4. Skull base and vertebrae: Visualized skull base is intact. No atlanto-occipital dissociation. Capsular hypertrophy is seen at the atlantoaxial articulation causing mild canal narrowing. The vertebral body heights are well maintained. No fracture or pathologic osseous lesion seen. Soft tissues and spinal canal: The visualized paraspinal soft tissues are unremarkable. No prevertebral soft tissue swelling is seen. The spinal canal is grossly unremarkable, no large epidural collection or significant canal narrowing. Disc levels: Multilevel cervical spine spondylosis is seen with disc osteophyte complex and uncovertebral osteophytes most notable at C4-C5 with moderate central canal stenosis and severe bilateral neural foraminal narrowing. For Upper chest: Mild biapical scarring is noted. Thoracic inlet is within normal limits. Other: None IMPRESSION: No acute intracranial abnormality. Findings consistent with age related atrophy and chronic small  vessel ischemia No acute fracture or malalignment of the spine. Electronically Signed   By: Prudencio Pair M.D.   On: 06/09/2020 01:06   CT PELVIS WO CONTRAST  Result Date: 06/09/2020 CLINICAL DATA:  Acute pain due to trauma EXAM: CT PELVIS WITHOUT CONTRAST TECHNIQUE: Multidetector CT imaging of the pelvis was performed following the standard protocol without intravenous contrast. COMPARISON:  None. FINDINGS: Urinary Tract:  No abnormality visualized. Bowel: There is diffuse sigmoid diverticulosis without CT evidence for diverticulitis. Vascular/Lymphatic: Vascular calcifications are noted of the visualized abdominal aorta. There are no pathologically enlarged lymph nodes. Reproductive:  The patient is status post prior hysterectomy. Other:  There are bilateral fat containing inguinal hernias. Musculoskeletal: There are advanced degenerative changes at the L4-L5 level resulting in a grade 1-2 anterolisthesis of L4 on L5. There is a small fluid collection lateral to the greater trochanter of the proximal left femur. This may represent a small hematoma. There is no acute displaced fracture. However, evaluation is limited by osteopenia. IMPRESSION: 1. No acute displaced  fracture. However, evaluation is limited by osteopenia. 2. Small fluid collection lateral to the greater trochanter of the proximal left femur. This may represent a small hematoma. 3. There are advanced degenerative changes of the lower lumbar spine as detailed above. Aortic Atherosclerosis (ICD10-I70.0). Electronically Signed   By: Constance Holster M.D.   On: 06/09/2020 02:46   DG Hand Complete Right  Result Date: 06/09/2020 CLINICAL DATA:  Pain EXAM: RIGHT HAND - COMPLETE 3+ VIEW COMPARISON:  None. FINDINGS: There is osteopenia which limits detection of nondisplaced fractures. There are advanced degenerative changes of the first carpometacarpal joint. There is no acute displaced fracture or dislocation. There is a probable old fracture of the  ulnar styloid process. Degenerative changes are noted of the wrist and interphalangeal joints. IMPRESSION: 1. No acute displaced fracture or dislocation. 2. Advanced degenerative changes of the first carpometacarpal joint. 3. Osteopenia. Electronically Signed   By: Constance Holster M.D.   On: 06/09/2020 03:31   DG HIP UNILAT WITH PELVIS 2-3 VIEWS LEFT  Result Date: 06/09/2020 CLINICAL DATA:  Pain status post fall EXAM: DG HIP (WITH OR WITHOUT PELVIS) 2-3V LEFT; DG HIP (WITH OR WITHOUT PELVIS) 2-3V RIGHT COMPARISON:  CT dated 11/16/2019. FINDINGS: There is diffuse osteopenia which limits detection of nondisplaced fractures. There are moderate degenerative changes of both hips. There is no acute displaced fracture or dislocation. Vascular calcifications are noted. IMPRESSION: 1. Diffuse osteopenia which limits detection of nondisplaced fractures. 2. No acute displaced fracture or dislocation. 3. Moderate degenerative changes of both hips. Electronically Signed   By: Constance Holster M.D.   On: 06/09/2020 01:30   DG HIP UNILAT WITH PELVIS 2-3 VIEWS RIGHT  Result Date: 06/09/2020 CLINICAL DATA:  Pain status post fall EXAM: DG HIP (WITH OR WITHOUT PELVIS) 2-3V LEFT; DG HIP (WITH OR WITHOUT PELVIS) 2-3V RIGHT COMPARISON:  CT dated 11/16/2019. FINDINGS: There is diffuse osteopenia which limits detection of nondisplaced fractures. There are moderate degenerative changes of both hips. There is no acute displaced fracture or dislocation. Vascular calcifications are noted. IMPRESSION: 1. Diffuse osteopenia which limits detection of nondisplaced fractures. 2. No acute displaced fracture or dislocation. 3. Moderate degenerative changes of both hips. Electronically Signed   By: Constance Holster M.D.   On: 06/09/2020 01:30    Micro Results     Recent Results (from the past 240 hour(s))  Resp Panel by RT-PCR (Flu A&B, Covid) Nasopharyngeal Swab     Status: None   Collection Time: 06/09/20  2:52 AM   Specimen:  Nasopharyngeal Swab; Nasopharyngeal(NP) swabs in vial transport medium  Result Value Ref Range Status   SARS Coronavirus 2 by RT PCR NEGATIVE NEGATIVE Final    Comment: (NOTE) SARS-CoV-2 target nucleic acids are NOT DETECTED.  The SARS-CoV-2 RNA is generally detectable in upper respiratory specimens during the acute phase of infection. The lowest concentration of SARS-CoV-2 viral copies this assay can detect is 138 copies/mL. A negative result does not preclude SARS-Cov-2 infection and should not be used as the sole basis for treatment or other patient management decisions. A negative result may occur with  improper specimen collection/handling, submission of specimen other than nasopharyngeal swab, presence of viral mutation(s) within the areas targeted by this assay, and inadequate number of viral copies(<138 copies/mL). A negative result must be combined with clinical observations, patient history, and epidemiological information. The expected result is Negative.  Fact Sheet for Patients:  EntrepreneurPulse.com.au  Fact Sheet for Healthcare Providers:  IncredibleEmployment.be  This test is no t  yet approved or cleared by the [place] and  has been authorized for detection and/or diagnosis of SARS-CoV-2 by FDA under an Emergency Use Authorization (EUA). This EUA will remain  in effect (meaning this test can be used) for the duration of the COVID-19 declaration under Section 564(b)(1) of the Act, 21 U.S.C.section 360bbb-3(b)(1), unless the authorization is terminated  or revoked sooner.       Influenza A by PCR NEGATIVE NEGATIVE Final   Influenza B by PCR NEGATIVE NEGATIVE Final    Comment: (NOTE) The Xpert Xpress SARS-CoV-2/FLU/RSV plus assay is intended as an aid in the diagnosis of influenza from Nasopharyngeal swab specimens and should not be used as a sole basis for treatment. Nasal washings and aspirates are unacceptable for  Xpert Xpress SARS-CoV-2/FLU/RSV testing.  Fact Sheet for Patients: EntrepreneurPulse.com.au  Fact Sheet for Healthcare Providers: IncredibleEmployment.be  This test is not yet approved or cleared by the [place] FDA and has been authorized for detection and/or diagnosis of SARS-CoV-2 by FDA under an Emergency Use Authorization (EUA). This EUA will remain in effect (meaning this test can be used) for the duration of the COVID-19 declaration under Section 564(b)(1) of the Act, 21 U.S.C. section 360bbb-3(b)(1), unless the authorization is terminated or revoked.  Performed at [facility], [place] [address], [place], [place] [address]   Culture, Urine     Status: Abnormal (Preliminary result)   Collection Time: 06/09/20  2:52 AM   Specimen: Urine, Catheterized  Result Value Ref Range Status   Specimen Description URINE, CATHETERIZED  Final   Special Requests NONE  Final   Culture (A)  Final    >=100,000 COLONIES/mL STAPHYLOCOCCUS AUREUS SUSCEPTIBILITIES TO FOLLOW Performed at [facility], [place] [address] [place], [place] [address]    Report Status PENDING  Incomplete    Today   Subjective    Colbie Sliker today has no headache,no chest abdominal pain,no new weakness tingling or numbness, feels much better wants to go home today    Objective   Blood pressure (!) 126/95, pulse 62, temperature 99.3 F (37.4 C), temperature source Oral, resp. rate 20, height 5\' 3"  (1.6 m), weight 49.2 kg, SpO2 90 %.   Intake/Output Summary (Last 24 hours) at 06/10/2020 1228 Last data filed at 06/10/2020 1000 Gross per 24 hour  Intake 1001.35 ml  Output 801 ml  Net 200.35 ml    Exam  Awake Alert, No new F.N deficits, Normal affect [place].AT, facial bruises stable, bandage on the left temple, left arm/wrist in splint Supple Neck,No JVD, No cervical lymphadenopathy appriciated.  Symmetrical Chest wall movement, Good air movement bilaterally,  CTAB RRR,No Gallops,Rubs or new Murmurs, No Parasternal Heave +ve B.Sounds, Abd Soft, Non tender, No organomegaly appriciated, No rebound -guarding or rigidity. No Cyanosis, Clubbing or edema, No new Rash or bruise   Data Review   CBC w Diff:  Lab Results  Component Value Date   WBC 8.5 06/10/2020   HGB 11.7 (L) 06/10/2020   HGB 11.9 11/04/2014   HCT 33.8 (L) 06/10/2020   HCT 36.2 11/04/2014   PLT 221 06/10/2020   PLT 260 11/04/2014   LYMPHOPCT 14 06/09/2020   LYMPHOPCT 19.5 11/04/2014   MONOPCT 9 06/09/2020   MONOPCT 9.5 11/04/2014   EOSPCT 1 06/09/2020   EOSPCT 4.9 11/04/2014   BASOPCT 0 06/09/2020   BASOPCT 0.8 11/04/2014    CMP:  Lab Results  Component Value Date   NA 139 06/10/2020   NA  143 11/04/2014   K 4.3 06/10/2020   K 4.3 11/04/2014   CL 111 06/10/2020   CO2 18 (L) 06/10/2020   CO2 25 11/04/2014   BUN 21 06/10/2020   BUN 32.6 (H) 11/04/2014   CREATININE 1.22 (H) 06/10/2020   CREATININE 1.4 (H) 11/04/2014   PROT 5.6 (L) 06/10/2020   PROT 6.9 11/04/2014   ALBUMIN 2.8 (L) 06/10/2020   ALBUMIN 3.5 11/04/2014   BILITOT 0.5 06/10/2020   BILITOT 0.36 11/04/2014   ALKPHOS 41 06/10/2020   ALKPHOS 45 11/04/2014   AST 20 06/10/2020   AST 22 11/04/2014   ALT 11 06/10/2020   ALT 16 11/04/2014  .   Total Time in preparing paper work, data evaluation and todays exam - 56 minutes  Lala Lund M.D on 06/10/2020 at 12:28 PM  [facility]

## 2020-06-10 NOTE — TOC Transition Note (Signed)
Transition of Care [facility] - CM/SW Discharge Note   Patient Details  Name: Cindy [name] MRN: [id number] Date of Birth: [birth year]  [facility]) CM/SW Contact:  Carles Collet, RN Phone Number: 06/10/2020, 11:03 AM   Clinical Narrative:   Jolaine Artist of [facility] who confirms that patient is active for services and they will continue to serve for [facility] RN PT at DC. L side platform for RW ordered to be sent to room prior to DC. Per nurse, caregiver will transport home. She will instruct on dressing changes.     Final next level of care: [place] Barriers to Discharge: No Barriers Identified   Patient Goals and CMS Choice Patient states their goals for this hospitalization and ongoing recovery are:: to go home CMS Medicare.gov Compare Post Acute Care list provided to:: Other (Comment Required) Choice offered to / list presented to : NA  Discharge Placement                       Discharge Plan and Services                DME Arranged: Gilford Rile platform DME Agency: [employer] Date DME Agency Contacted: 06/10/20 Time DME Agency Contacted: [phone] Representative spoke with at DME Agency: [place]: PT,RN [place]: [place] Date [place]: 06/10/20 Time [place]: 1103 Representative spoke with at [place]: [place] ([place]) Interventions     Readmission Risk Interventions No flowsheet data found.

## 2020-06-10 NOTE — Progress Notes (Signed)
Physical Therapy Treatment Patient Details Name: Cindy [name] MRN: [id number] DOB: March 15, 1923 Today's Date: 06/10/2020    History of Present Illness Pt is a 87 y.o. female admitted 06/09/20 after fall sustaining L distal radius fx; plan for non-operative management. PMH includes HTN, PE, CKD, CAD.   PT Comments    Pt progressing with mobility. Today's session focused on transfer and gait training with LUE platform walker to allow pt to WB through L elbow for additional support; pt still requiring min-modA to stand and maintain balance. Spoke with pt's caregiver [name]) regarding use of this DME and assist level pt currently needing; [name] reports understanding. Pt preparing for d/c home this afternoon with [facility] services.   Follow Up Recommendations  Home health PT;Supervision/Assistance - 24 hour     Equipment Recommendations  Other (LUE platform for pt's walker)    Recommendations for Other Services       Precautions / Restrictions Precautions Precautions: Fall;Other (comment) Precaution Comments: monitor O2, orthostatic BP Restrictions Weight Bearing Restrictions: Yes LUE Weight Bearing: Weight bear through elbow only    Mobility  Bed Mobility               General bed mobility comments: Received sitting in recliner    Transfers Overall transfer level: Needs assistance Equipment used: Left platform walker;1 person hand held assist Transfers: Sit to/from Stand Sit to Stand: Min assist;Mod assist         General transfer comment: Multiple sit<>stands from recliner and BSC to L platform walker with initial minA for trnk elevation, regressing to modA with fatigue; repeated cues for correct hand placement and assist to place/remove LUE from platform  Ambulation/Gait Ambulation/Gait assistance: Min assist;Min guard Gait Distance (Feet): 10 Feet Assistive device: Left platform walker Gait Pattern/deviations: Step-to pattern;Trunk flexed;Antalgic Gait velocity:  Decreased   General Gait Details: Slow, unsteady gait with L platform RW and intermittent minA for stability; distance limited by urinary urgency requiring transfer to [employer]; pt with increased fatigue and pain after this. When asked if pt enjoyed having support of platform walker, pt states, "I can't remember"   Stairs             Wheelchair Mobility    Modified Rankin (Stroke Patients Only)       Balance Overall balance assessment: Needs assistance Sitting-balance support: No upper extremity supported Sitting balance-Leahy Scale: Fair     Standing balance support: Single extremity supported;During functional activity Standing balance-Leahy Scale: Poor Standing balance comment: Reliant on UE support; dependent for posterior pericare                            Cognition Arousal/Alertness: Awake/alert Behavior During Therapy: WFL for tasks assessed/performed Overall Cognitive Status: No family/caregiver present to determine baseline cognitive functioning                                 General Comments: Pt pleasant and able to verbalize situation well though noted memory deficits, repetitive reporting [name] (her aide) will have to bring her clothes > 4 times during session      Exercises Other Exercises Other Exercises: L fingers AROM - pt with limited motion due to pain and swelling    General Comments General comments (skin integrity, edema, etc.): SpO2 92% on RA, HR 59-68, post-mobility BP 127/71. Spoke with pt's caregiver [name]) regarding recommendation for LUE platform walker; [name] in  agreement and case manger notified      Pertinent Vitals/Pain Pain Assessment: Faces Faces Pain Scale: Hurts little more Pain Location: LUE, L knee, R finger laceration Pain Descriptors / Indicators: Grimacing;Guarding Pain Intervention(s): Monitored during session;Limited activity within patient's tolerance    Home Living                       Prior Function            PT Goals (current goals can now be found in the care plan section) Progress towards PT goals: Progressing toward goals    Frequency    Min 3X/week      PT Plan Current plan remains appropriate    Co-evaluation              AM-PAC PT "6 Clicks" Mobility   Outcome Measure  Help needed turning from your back to your side while in a flat bed without using bedrails?: A Little Help needed moving from lying on your back to sitting on the side of a flat bed without using bedrails?: A Little Help needed moving to and from a bed to a chair (including a wheelchair)?: A Little Help needed standing up from a chair using your arms (e.g., wheelchair or bedside chair)?: A Lot Help needed to walk in hospital room?: A Little Help needed climbing 3-5 steps with a railing? : A Lot 6 Click Score: 16    End of Session Equipment Utilized During Treatment: Gait belt Activity Tolerance: Patient tolerated treatment well;Patient limited by fatigue Patient left: in chair;with call bell/phone within reach;with chair alarm set Nurse Communication: Mobility status PT Visit Diagnosis: Other abnormalities of gait and mobility (R26.89);History of falling (Z91.81);Repeated falls (R29.6)     Time: 5732-2025 PT Time Calculation (min) (ACUTE ONLY): 36 min  Charges:  $Therapeutic Activity: 23-37 mins                     Mabeline Caras, PT, DPT Acute Rehabilitation Services  Pager [phone] Office [place] 06/10/2020, 12:12 PM

## 2020-06-10 NOTE — TOC CAGE-AID Note (Signed)
Transition of Care [facility]) - CAGE-AID Screening   Patient Details  Name: Cindy [name] MRN: [id number] Date of Birth: [birth year]  [facility]) CM/SW Contact:    Benard Halsted, LCSW Phone Number: 06/10/2020, 12:17 PM   Clinical Narrative: Patient is disoriented and unable to participate in screening.    CAGE-AID Screening: Substance Abuse Screening unable to be completed due to: : Patient unable to participate

## 2020-06-10 NOTE — Evaluation (Signed)
Occupational Therapy Evaluation Patient Details Name: Cindy [name] MRN: [id number] DOB: Aug 04, 1922 Today's Date: 06/10/2020    History of Present Illness Pt is a 87 y/o female admitted after fall. Found to have L distal radius fx. PMH includes HTN, PE, CKD, CAD, and GERD.   Clinical Impression   PTA, pt lives alone and has PCA who is present in AM when pt wakes up until PM when pt goes to bed. Pt reports assist from PCA for bathing/dressing, IADLs in the home and transportation. Pt reports not using AD for mobility but will hold to PCA's arm as needed. Pt presents now with deficits in use of L nondominant UE, standing balance, strength, endurance and safety awareness. Pt overall Min A for bed mobility, short mobility in room using handheld assist from OT. With handheld assist, pt noted to still be reaching out for extra support and would benefit from L platform walker trial to maintain precautions and improve stability. Pt requires Min A for UB ADLs and Mod A for LB ADLs due to deficits. Encouraged frequent ROM and elevation of L UE to decrease swelling. Pt endorses dizziness in standing but BP readings WFL (see below). Pt poses a high fall risk and would require 24/7 supervision/assist initially to ensure safety with ADLs/mobility. Pt reports PCA can provide this 24/7 assist, but will need short term rehab at SNF if PCA unavailable for 24/7.     Follow Up Recommendations  Home health OT;Supervision/Assistance - 24 hour (24/7 initially. SNF if 24/7 unable to be provided)    Equipment Recommendations  Other (comment) (L platform walker pending education in use)    Recommendations for Other Services       Precautions / Restrictions Precautions Precautions: Fall;Other (comment) Precaution Comments: monitor O2, orthostatic BP Restrictions Weight Bearing Restrictions: Yes LUE Weight Bearing: Weight bear through elbow only      Mobility Bed Mobility Overal bed mobility: Needs Assistance Bed  Mobility: Supine to Sit     Supine to sit: Min assist;HOB elevated     General bed mobility comments: Min A with hand held assist to bring trunk forward, Min A for scooting hips    Transfers Overall transfer level: Needs assistance Equipment used: 1 person hand held assist Transfers: Sit to/from Stand Sit to Stand: Min assist         General transfer comment: Min A for sit to stand at bedside, cueing to avoid pushing up from L UE. Min A for mobility around bedside to chair with MinA for maintaining balance    Balance Overall balance assessment: Needs assistance Sitting-balance support: No upper extremity supported Sitting balance-Leahy Scale: Fair     Standing balance support: Single extremity supported;During functional activity Standing balance-Leahy Scale: Poor Standing balance comment: reliant on at least one UE support, would benefit from B UE support                           ADL either performed or assessed with clinical judgement   ADL Overall ADL's : Needs assistance/impaired Eating/Feeding: Set up;Sitting Eating/Feeding Details (indicate cue type and reason): Setup to drink from cup, used B hands to hold cup Grooming: Sitting;Minimal assistance Grooming Details (indicate cue type and reason): setup to wash face, will likely need Min A for bimanual tasks such as brushing teeth Upper Body Bathing: Minimal assistance;Sitting   Lower Body Bathing: Minimal assistance;Sit to/from stand   Upper Body Dressing : Minimal assistance;Sitting   Lower Body  Dressing: Moderate assistance;Sit to/from stand Lower Body Dressing Details (indicate cue type and reason): Assistance for managing socks Toilet Transfer: Minimal assistance;Ambulation   Toileting- Clothing Manipulation and Hygiene: Moderate assistance;Sit to/from stand       Functional mobility during ADLs: Minimal assistance General ADL Comments: Pt with limitations due to chronic knee pain, standing  balance deficits without UE support and UE precautions (though nondominant hand)     Vision Baseline Vision/History: Wears glasses Patient Visual Report: No change from baseline Vision Assessment?: No apparent visual deficits     Perception     Praxis      Pertinent Vitals/Pain Pain Assessment: Faces Faces Pain Scale: Hurts little more Pain Location: L knee after mobility Pain Descriptors / Indicators: Grimacing;Guarding Pain Intervention(s): Limited activity within patient's tolerance;Monitored during session;Repositioned     Hand Dominance Right   Extremity/Trunk Assessment Upper Extremity Assessment Upper Extremity Assessment: LUE deficits/detail LUE Deficits / Details: L UE thumb spica splint, digits edematous LUE: Unable to fully assess due to immobilization LUE Coordination: decreased fine motor   Lower Extremity Assessment Lower Extremity Assessment: Defer to PT evaluation   Cervical / Trunk Assessment Cervical / Trunk Assessment: Kyphotic   Communication Communication Communication: No difficulties   Cognition Arousal/Alertness: Awake/alert Behavior During Therapy: WFL for tasks assessed/performed Overall Cognitive Status: No family/caregiver present to determine baseline cognitive functioning                                 General Comments: Pt pleasant and able to verbalize situation well though noted memory deficits, repetitive reporting [name] (her aide) will have to bring her clothes > 4 times during session   General Comments  Received on 1 L O2, reports does not use at baseline. Trialed on RA with SpO2 88% after activity, replace 1 L O2. Assessed BP with 144/72 sitting EOB, 126/95 standing as pt reported dizziness with initial standing that subsided. Encouraged ROM of digits and propping L UE up to decrease swelling    Exercises     Shoulder Instructions      Home Living Family/patient expects to be discharged to:: Private  residence Living Arrangements: Alone Available Help at Discharge: Personal care attendant;Available PRN/intermittently (aide at home with pt from morning until she goes to bed in evening, can stay 24/7 if needed) Type of Home: House Home Access: Ramped entrance;Stairs to enter Entrance Stairs-Number of Steps: 4 Entrance Stairs-Rails: Right Home Layout: One level     Bathroom Shower/Tub: Occupational psychologist: [place]: Shower seat;Cane - single point;Walker - 2 wheels;Wheelchair - manual   Additional Comments: pt reports the equipment was her late husband's      Prior Functioning/Environment Level of Independence: Needs assistance  Gait / Transfers Assistance Needed: supervision for ambulation without AD. Handheld assist from aide as needed ADL's / Homemaking Assistance Needed: assist for bathing/dressing as needed by caregiver. [name] also completes all IADLs including housekeeping, laundry and cooking, and provides transportation.  Pt has a medical alert bracelet when she is home alone.            OT Problem List: Decreased strength;Decreased activity tolerance;Impaired balance (sitting and/or standing);Decreased range of motion;Decreased coordination;Decreased cognition;Decreased safety awareness;Decreased knowledge of use of DME or AE;Decreased knowledge of precautions;Impaired UE functional use;Pain;Increased edema      OT Treatment/Interventions: Self-care/ADL training;Therapeutic exercise;DME and/or AE instruction;Therapeutic activities;Patient/family education;Balance training    OT  Goals(Current goals can be found in the care plan section) Acute Rehab OT Goals Patient Stated Goal: go home, have [name] bring me some clothes OT Goal Formulation: With patient Time For Goal Achievement: 06/24/20 Potential to Achieve Goals: Good ADL Goals Pt Will Perform Grooming: with set-up;standing Pt Will Transfer to Toilet: with supervision;ambulating Pt  Will Perform Toileting - Clothing Manipulation and hygiene: with set-up;sit to/from stand;sitting/lateral leans Pt/caregiver will Perform Home Exercise Program: Increased ROM;Left upper extremity;With Supervision;With written HEP provided Additional ADL Goal #1: Pt to verbalize at least 2 fall prevention strategies  OT Frequency: Min 2X/week   Barriers to D/C:            Co-evaluation              AM-PAC OT "6 Clicks" Daily Activity     Outcome Measure Help from another person eating meals?: A Little Help from another person taking care of personal grooming?: A Little Help from another person toileting, which includes using toliet, bedpan, or urinal?: A Lot Help from another person bathing (including washing, rinsing, drying)?: A Little Help from another person to put on and taking off regular upper body clothing?: A Little Help from another person to put on and taking off regular lower body clothing?: A Lot 6 Click Score: 16   End of Session Equipment Utilized During Treatment: Gait belt;Oxygen Nurse Communication: Mobility status;Weight bearing status;Other (comment) (bandage coming off of face, IV beeping)  Activity Tolerance: Patient tolerated treatment well Patient left: in chair;with call bell/phone within reach;with chair alarm set  OT Visit Diagnosis: Unsteadiness on feet (R26.81);Other abnormalities of gait and mobility (R26.89);Muscle weakness (generalized) (M62.81);History of falling (Z91.81);Pain Pain - Right/Left: Left Pain - part of body: Knee                Time: 6226-3335 OT Time Calculation (min): 29 min Charges:  OT General Charges $OT Visit: 1 Visit OT Evaluation $OT Eval Moderate Complexity: 1 Mod OT Treatments $Self Care/Home Management : 8-22 mins  Malachy Chamber, OTR/L Acute Rehab Services Office: [phone]  Layla Maw 06/10/2020, 8:08 AM

## 2020-06-10 NOTE — Progress Notes (Signed)
[name] to be D/C'd home per MD order. D/C instructions discussed withRoger Caregiver and all questions answered.  An After Visit Summary was printed and given to the caregiver.  Patient escorted via [place], and D/C home via private auto. Patient belongings and valuables sent with patient. DME (platform wheelchair, and dressing change supplied) given to patient. Dressing change demonstrated to caregiver.  Manasseh Pittsley J Hoover Grewe  06/10/2020 11:03 AM

## 2020-06-10 NOTE — Progress Notes (Signed)
Page to Dr. Candiss Norse: Cindy [name], urine culture back as staph aureus. Not sure if it changes the treatment. Still waiting for DME before DC  R: Treatment will be updated.

## 2020-06-10 NOTE — Care Management Obs Status (Signed)
[place] NOTIFICATION   Patient Details  Name: Cindy [name] MRN: [id number] Date of Birth: 1922-08-10   Medicare Observation Status Notification Given:  Yes    Carles Collet, RN 06/10/2020, 11:19 AM

## 2020-06-10 NOTE — Discharge Instructions (Signed)
Follow with Primary MD [place], Legrand Como, MD in 7 days   Get CBC, CMP, 2 view Chest X ray -  checked next visit within 1 week by Primary MD    Activity: As tolerated with Full fall precautions use walker/cane & assistance as needed, do not lift anything heavier than 1 pound with your left hand. Keep your facial skin clean and dry at all times.  Disposition Home    Diet: Heart Healthy   Special Instructions: If you have smoked or chewed Tobacco  in the last 2 yrs please stop smoking, stop any regular Alcohol  and or any Recreational drug use.  On your next visit with your primary care physician please Get Medicines reviewed and adjusted.  Please request your Prim.MD to go over all Hospital Tests and Procedure/Radiological results at the follow up, please get all Hospital records sent to your Prim MD by signing hospital release before you go home.  If you experience worsening of your admission symptoms, develop shortness of breath, life threatening emergency, suicidal or homicidal thoughts you must seek medical attention immediately by calling 911 or calling your MD immediately  if symptoms less severe.  You Must read complete instructions/literature along with all the possible adverse reactions/side effects for all the Medicines you take and that have been prescribed to you. Take any new Medicines after you have completely understood and accpet all the possible adverse reactions/side effects.         Information on my medicine - ELIQUIS (apixaban)  This medication education was reviewed with me or my healthcare representative as part of my discharge preparation.  The pharmacist that spoke with me during my hospital stay was:  Onnie Boer, RPH-CPP  Why was Eliquis prescribed for you? Eliquis was prescribed to treat blood clots that may have been found in the veins of your legs (deep vein thrombosis) or in your lungs (pulmonary embolism) and to reduce the risk of them occurring  again.  What do You need to know about Eliquis ? Continue Eliquis 2.5mg  taken TWICE daily.  Eliquis may be taken with or without food.   Try to take the dose about the same time in the morning and in the evening. If you have difficulty swallowing the tablet whole please discuss with your pharmacist how to take the medication safely.  Take Eliquis exactly as prescribed and DO NOT stop taking Eliquis without talking to the doctor who prescribed the medication.  Stopping may increase your risk of developing a new blood clot.  Refill your prescription before you run out.  After discharge, you should have regular check-up appointments with your healthcare provider that is prescribing your Eliquis.    What do you do if you miss a dose? If a dose of ELIQUIS is not taken at the scheduled time, take it as soon as possible on the same day and twice-daily administration should be resumed. The dose should not be doubled to make up for a missed dose.  Important Safety Information A possible side effect of Eliquis is bleeding. You should call your healthcare provider right away if you experience any of the following: ? Bleeding from an injury or your nose that does not stop. ? Unusual colored urine (red or dark brown) or unusual colored stools (red or black). ? Unusual bruising for unknown reasons. ? A serious fall or if you hit your head (even if there is no bleeding).  Some medicines may interact with Eliquis and might increase your risk of  bleeding or clotting while on Eliquis. To help avoid this, consult your healthcare provider or pharmacist prior to using any new prescription or non-prescription medications, including herbals, vitamins, non-steroidal anti-inflammatory drugs (NSAIDs) and supplements.  This website has more information on Eliquis (apixaban): http://www.eliquis.com/eliquis/home

## 2020-06-11 LAB — URINE CULTURE: Culture: >=100000 — AB

## 2020-06-15 IMAGING — CR LEFT SHOULDER - 2+ VIEW
3 series · 3 of 3 positions shown · non-contrast
Comparison: None.

CLINICAL DATA: Left shoulder pain after fall.

EXAM:
LEFT SHOULDER - 2+ VIEW

[x shoulder ap left (1 of 3)]
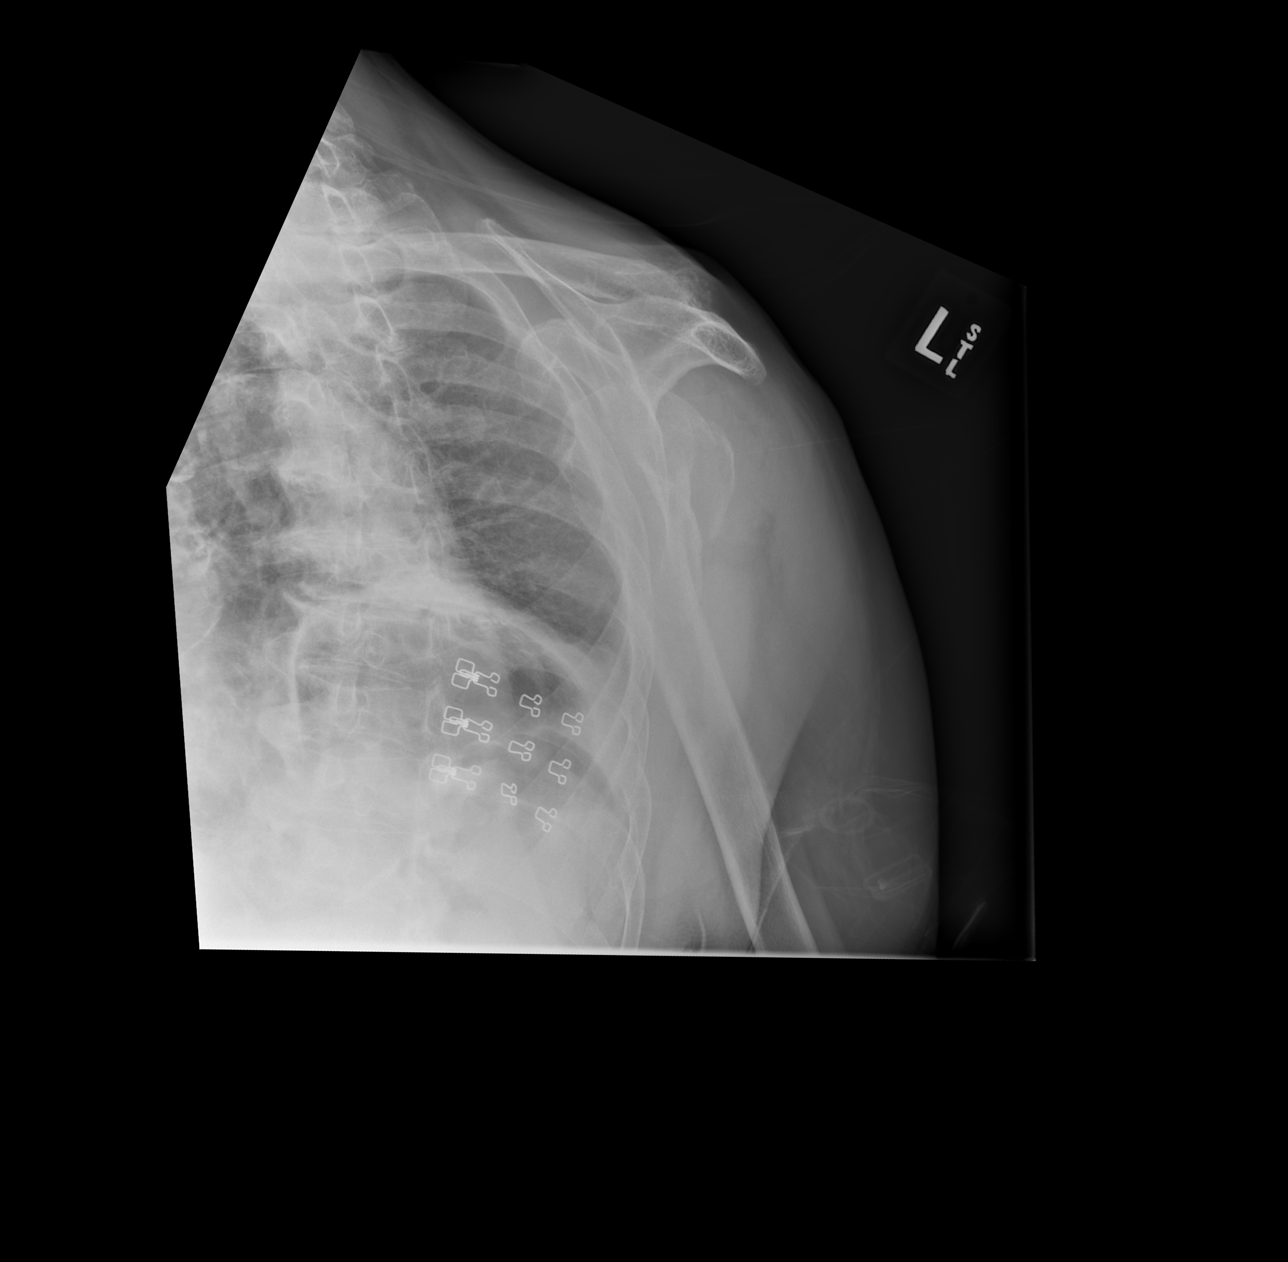

[x shoulder ap left (2 of 3)]
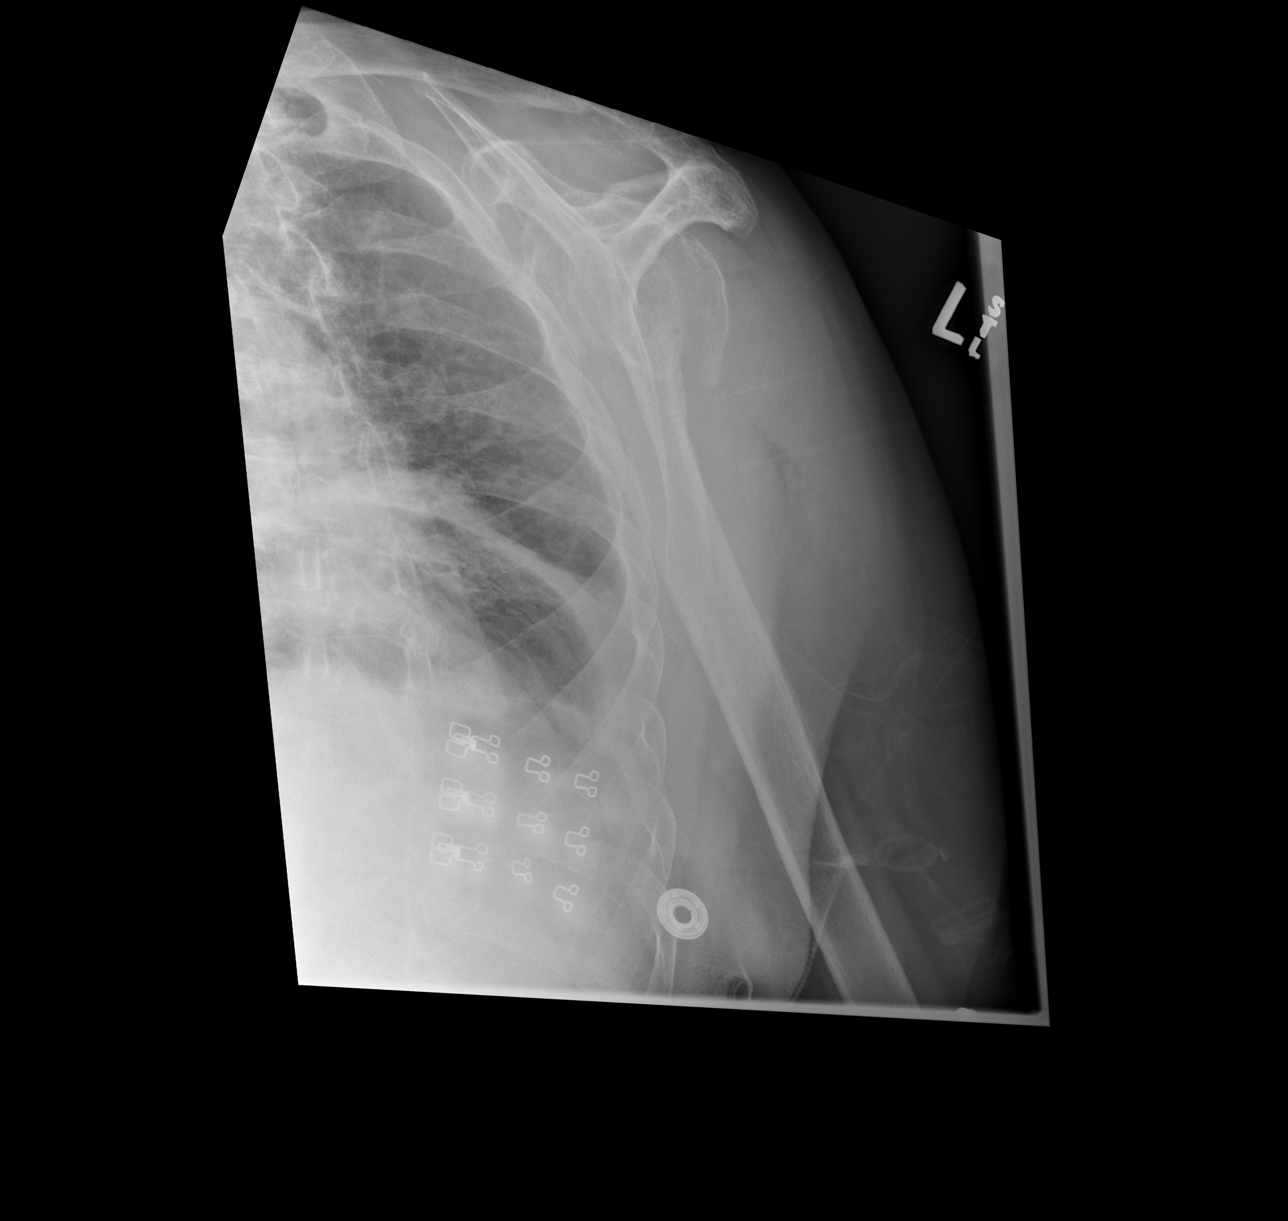

[x shoulder ap left (3 of 3)]
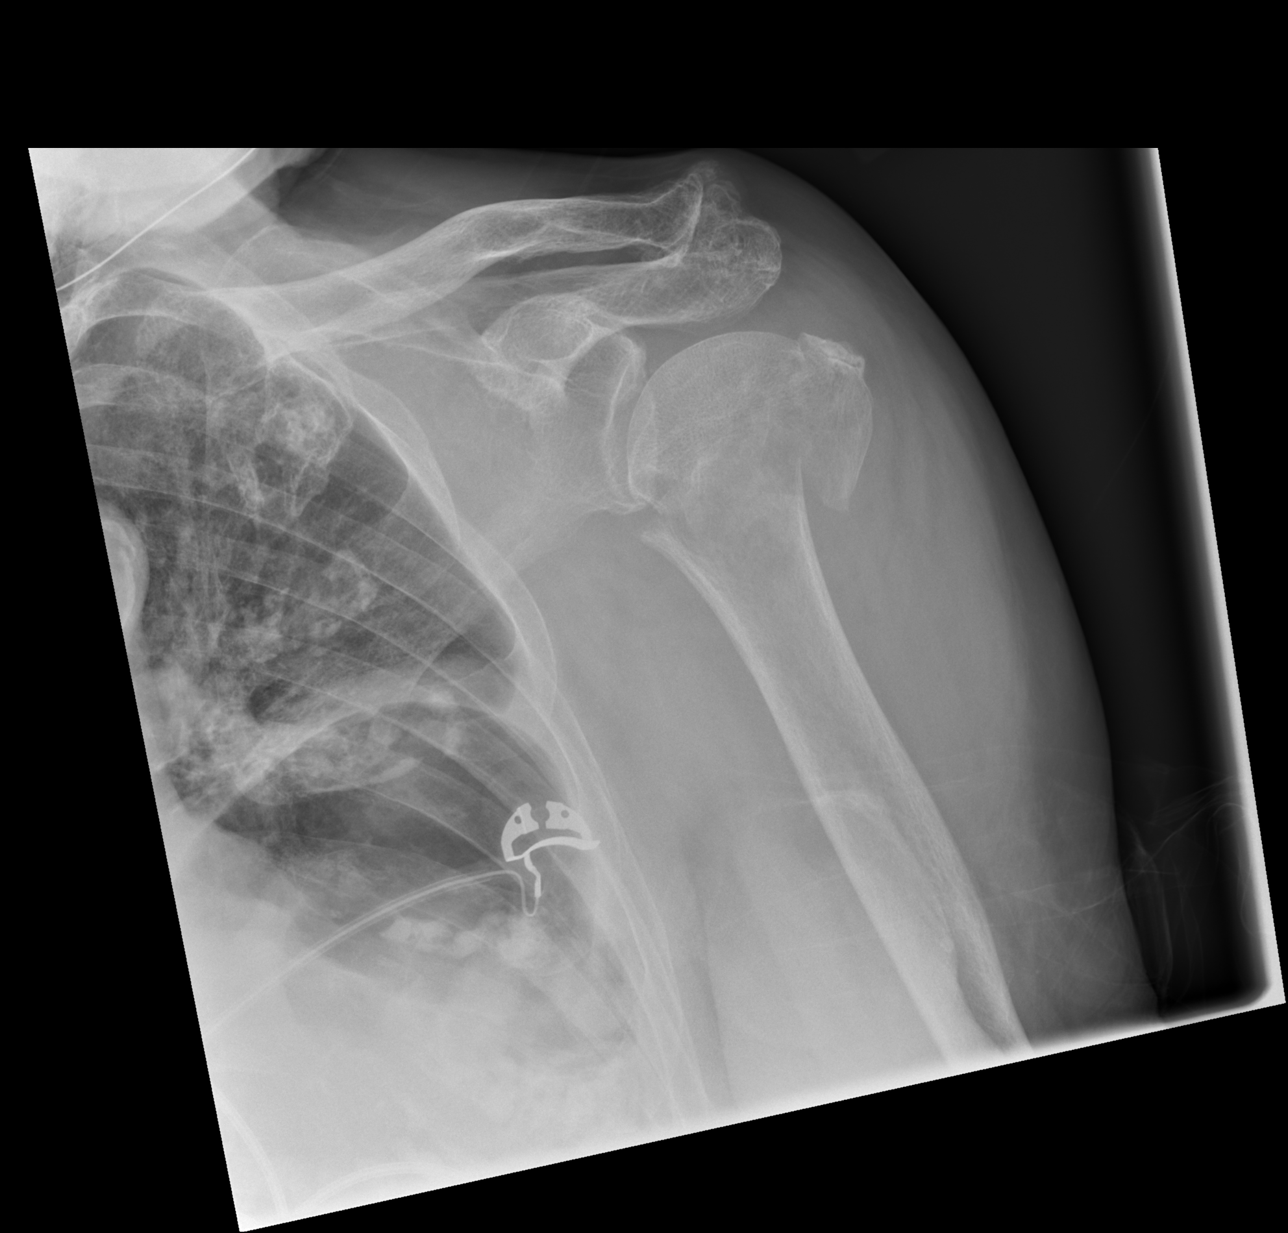

[3 of 3 positions shown; findings below may reference images not displayed]

FINDINGS: Moderately displaced fracture is seen involving the proximal left
humeral head and neck. Severe degenerative changes seen involving
the left acromioclavicular joint. Visualized ribs are unremarkable.
IMPRESSION: Moderately displaced proximal left humeral head and neck fracture.

## 2020-07-01 ENCOUNTER — Other Ambulatory Visit: Payer: Self-pay

## 2020-07-01 ENCOUNTER — Ambulatory Visit (INDEPENDENT_AMBULATORY_CARE_PROVIDER_SITE_OTHER): Payer: Medicare Other | Admitting: Cardiovascular Disease

## 2020-07-01 ENCOUNTER — Encounter: Payer: Self-pay | Admitting: Cardiovascular Disease

## 2020-07-01 VITALS — BP 126/62 | HR 54 | Ht 63.0 in | Wt 96.8 lb

## 2020-07-01 DIAGNOSIS — I10 Essential (primary) hypertension: Secondary | ICD-10-CM | POA: Diagnosis not present

## 2020-07-01 DIAGNOSIS — I214 Non-ST elevation (NSTEMI) myocardial infarction: Secondary | ICD-10-CM

## 2020-07-01 DIAGNOSIS — I447 Left bundle-branch block, unspecified: Secondary | ICD-10-CM | POA: Diagnosis not present

## 2020-07-01 DIAGNOSIS — I2692 Saddle embolus of pulmonary artery without acute cor pulmonale: Secondary | ICD-10-CM | POA: Diagnosis not present

## 2020-07-01 NOTE — Progress Notes (Signed)
07/01/2020 Cindy [name]   1922/07/01  [id number]  Primary Physician Althe[place], Legrand Como, MD Primary Cardiologist: Lorretta Harp MD Lupe Carney, [place]  HPI:  Cindy [name] is a 87 y.o.  thin and frail appearing married Caucasian female whose husband [name] a patient of mine.Unfortunately show passed away on 03-28-16 after they have been married for 68 years. She has 2 living children and 6 grandchildren.I last saw her in the office 06/08/2019.Marland KitchenMarland KitchenShe is accompanied by her caregiver [name] today. She has a history of normal coronary arteries by cath in 2005 and again in 2007. She has chronic left bundle branch block, hypertension and hyperlipidemia. She also has GERD, followed by Dr. Carol Ada with an endoscopy in the past. [name] followed her lipid profile.She is fairly independent and is accompanied by her caregiver who is here for several hours in the morning. She does live alone and continues to drive and to her daily chores.  She was hospitalized on November 16, 2019  with bilateral submassive pulmonary emboli and was placed on Eliquis.  She was recently hospitalized with rib fracture after coming out of her bathroom and hitting the side of a piece of furniture.  She still taking care of by Maurice March, her caregiver.    Since I saw her in September of last year she was admitted to the hospital earlier this month with a mechanical fall and wrist fracture.   Current Meds  Medication Sig  . acetaminophen (TYLENOL) 325 MG tablet Take 1-2 tablets (325-650 mg total) by mouth every 6 (six) hours as needed for mild pain or moderate pain (pain score 1-3 or temp > 100.5). (Patient taking differently: Take 650 mg by mouth in the morning, at noon, and at bedtime.)  . apixaban (ELIQUIS) 2.5 MG TABS tablet Take 1 tablet (2.5 mg total) by mouth 2 (two) times daily.  . B Complex Vitamins (B COMPLEX 100 PO) Take 1 tablet by mouth daily.  . Cholecalciferol (VITAMIN D) 125  MCG (5000 UT) CAPS Take 5,000 Units by mouth daily.  Marland Kitchen CRANBERRY PO Take 1 tablet by mouth daily.   Marland Kitchen glucosamine-chondroitin 500-400 MG tablet Take 1 tablet by mouth daily.  . hydroxypropyl methylcellulose / hypromellose (ISOPTO TEARS / GONIOVISC) 2.5 % ophthalmic solution Place 1 drop into both eyes daily.  Marland Kitchen levothyroxine (SYNTHROID) 75 MCG tablet Take 75 mcg by mouth daily before breakfast.   . lidocaine (LIDODERM) 5 % Place 1 patch onto the skin daily. Remove & Discard patch within 12 hours or as directed by MD (Patient taking differently: Place 1 patch onto the skin daily. Remove & Discard patch within 12 hours or as directed by MD. Apply to knee)  . mirabegron ER (MYRBETRIQ) 50 MG TB24 tablet Take 50 mg by mouth daily.   . Multiple Vitamins-Minerals (HAIR SKIN & NAILS ADVANCED) TABS Take 3 tablets by mouth daily.  . Multiple Vitamins-Minerals (OCUVITE EXTRA) TABS Take 1 tablet by mouth daily.  Marland Kitchen omega-3 acid ethyl esters (LOVAZA) 1 g capsule Take 1 g by mouth daily.   Marland Kitchen omeprazole (PRILOSEC) 20 MG capsule Take 20 mg by mouth daily.  . polyethylene glycol (MIRALAX) 17 g packet Take 17 g by mouth daily.  . simvastatin (ZOCOR) 20 MG tablet Take 1 tablet (20 mg total) by mouth every evening.  . solifenacin (VESICARE) 10 MG tablet Take 10 mg by mouth daily.  . traMADol (ULTRAM) 50 MG tablet Take 50 mg by mouth 2 (two) times daily  as needed for moderate pain or severe pain.   . Triamcinolone Acetonide 0.025 % LOTN Apply 1 application topically every other day. For scalp  . TURMERIC-GINGER PO Take 1 tablet by mouth daily.     Allergies  Allergen Reactions  . Fish Allergy Hives  . Diclofenac Other (See Comments)    unknown  . Ibuprofen Other (See Comments)    Affects kidney function  . Sulfa Antibiotics     Unknown reaction    Social History   Socioeconomic History  . Marital status: Widowed    Spouse name: Not on file  . Number of children: Not on file  . Years of education: Not  on file  . Highest education level: Not on file  Occupational History  . Occupation: Retired  Tobacco Use  . Smoking status: Never Smoker  . Smokeless tobacco: Never Used  Vaping Use  . Vaping Use: Never used  Substance and Sexual Activity  . Alcohol use: No  . Drug use: No  . Sexual activity: Not on file  Other Topics Concern  . Not on file  Social History Narrative   Has a caregiver daily   Social Determinants of Health   Financial Resource Strain: Not on file  Food Insecurity: Not on file  Transportation Needs: Not on file  Physical Activity: Not on file  Stress: Not on file  Social Connections: Not on file  Intimate Partner Violence: Not on file     Review of Systems: General: negative for chills, fever, night sweats or weight changes.  Cardiovascular: negative for chest pain, dyspnea on exertion, edema, orthopnea, palpitations, paroxysmal nocturnal dyspnea or shortness of breath Dermatological: negative for rash Respiratory: negative for cough or wheezing Urologic: negative for hematuria Abdominal: negative for nausea, vomiting, diarrhea, bright red blood per rectum, melena, or hematemesis Neurologic: negative for visual changes, syncope, or dizziness All other systems reviewed and are otherwise negative except as noted above.    Blood pressure 126/62, pulse (!) 54, height 5\' 3"  (1.6 m), weight 96 lb 12.8 oz (43.9 kg).  General appearance: alert and no distress Neck: no adenopathy, no carotid bruit, no JVD, supple, symmetrical, trachea midline and thyroid not enlarged, symmetric, no tenderness/mass/nodules Lungs: clear to auscultation bilaterally Heart: regular rate and rhythm, S1, S2 normal, no murmur, click, rub or gallop Extremities: extremities normal, atraumatic, no cyanosis or edema Pulses: 2+ and symmetric Skin: Skin color, texture, turgor normal. No rashes or lesions Neurologic: Alert and oriented X 3, normal strength and tone. Normal symmetric reflexes.  Normal coordination and gait  EKG sinus bradycardia at 54 with left bundle branch block.  I personally reviewed this EKG.  ASSESSMENT AND PLAN:   Left bundle branch block Chronic  Essential hypertension History of essential hypertension blood pressure measured today 126/62.  She is not on antihypertensive medications.  Hyperlipidemia History of hyperlipidemia on statin therapy with lipid profile performed 03/05/2019 revealing a total cholesterol of 202, LDL of 120 and HDL of 56.  Pulmonary embolism ([place]) History of submassive pulmonary embolism in August of last year on Eliquis oral anticoagulation.      Lorretta Harp MD FACP,FACC,FAHA, [facility] 07/01/2020 3:54 PM

## 2020-07-01 NOTE — Assessment & Plan Note (Signed)
History of submassive pulmonary embolism in August of last year on Eliquis oral anticoagulation.

## 2020-07-01 NOTE — Assessment & Plan Note (Signed)
History of hyperlipidemia on statin therapy with lipid profile performed 03/05/2019 revealing a total cholesterol of 202, LDL of 120 and HDL of 56.

## 2020-07-01 NOTE — Assessment & Plan Note (Signed)
History of essential hypertension blood pressure measured today 126/62.  She is not on antihypertensive medications.

## 2020-07-01 NOTE — Patient Instructions (Signed)
Medication Instructions:  Your physician recommends that you continue on your current medications as directed. Please refer to the Current Medication list given to you today.  *If you need a refill on your cardiac medications before your next appointment, please call your pharmacy*   Follow-Up: At [facility], you and your health needs are our priority.  As part of our continuing mission to provide you with exceptional heart care, we have created designated Provider Care Teams.  These Care Teams include your primary Cardiologist (physician) and Advanced Practice Providers (APPs -  Physician Assistants and Nurse Practitioners) who all work together to provide you with the care you need, when you need it.  We recommend signing up for the patient portal called "MyChart".  Sign up information is provided on this After Visit Summary.  MyChart is used to connect with patients for Virtual Visits (Telemedicine).  Patients are able to view lab/test results, encounter notes, upcoming appointments, etc.  Non-urgent messages can be sent to your provider as well.   To learn more about what you can do with MyChart, go to [email or web address].    Your next appointment:   6 month(s)  The format for your next appointment:   In Person  Provider:   You will see one of the following Advanced Practice Providers on your designated Care Team:    Callie Goodrich, PA-C  Jesse Cleaver, FNP  Then, Jonathan Berry, MD will plan to see you again in 12 month(s). 

## 2020-07-01 NOTE — Assessment & Plan Note (Signed)
Chronic. 

## 2021-10-01 IMAGING — CT CT ABD-PELV W/ CM
3 of 7 series · 11 of 46 positions shown, 16 images · IV contrast (OMNIPAQUE 300)
Comparison: CT 03/05/2019, 02/18/2019, 11/08/2016

CLINICAL DATA: Abdominal pain with leukocytosis

EXAM:
CT ABDOMEN AND PELVIS WITH CONTRAST
TECHNIQUE: Multidetector CT imaging of the abdomen and pelvis was performed
using the standard protocol following bolus administration of
intravenous contrast.
CONTRAST:  80mL OMNIPAQUE IOHEXOL 300 MG/ML  SOLN

[Series 7: coronal st · coronal · 0.67mm/px · 3 of 107 slices shown]
[im 36/107  soft-tissue]
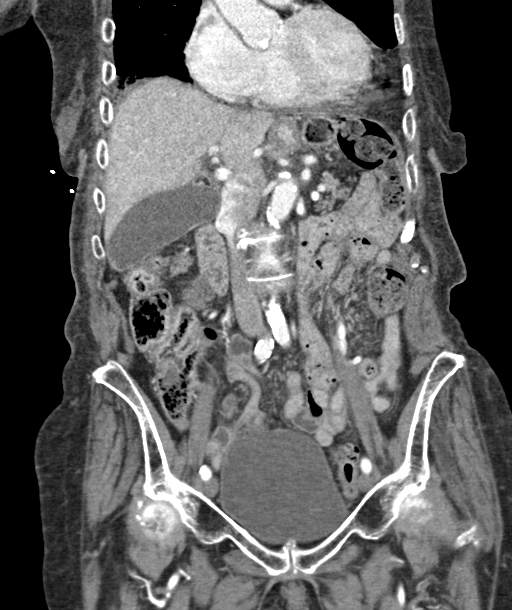
[im 48/107  soft-tissue]
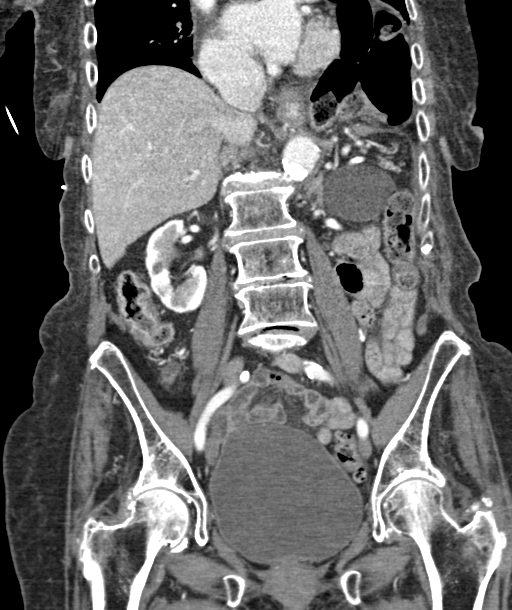
[im 59/107  soft-tissue]
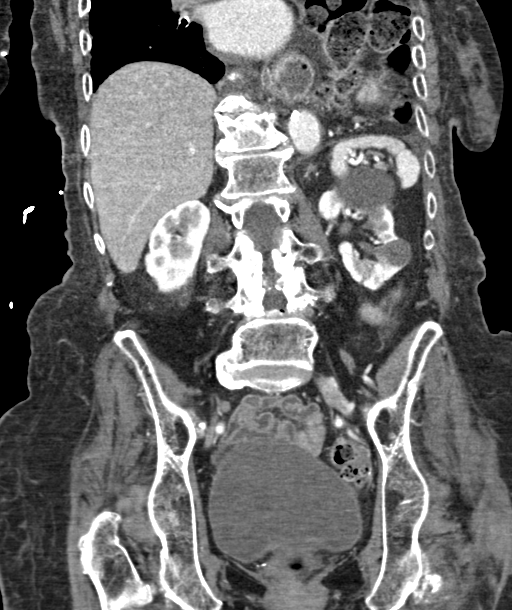

[Series 8: sagittal st · sagittal · 0.42mm/px · 1 of 151 slices shown]
[im 51/151  soft-tissue]
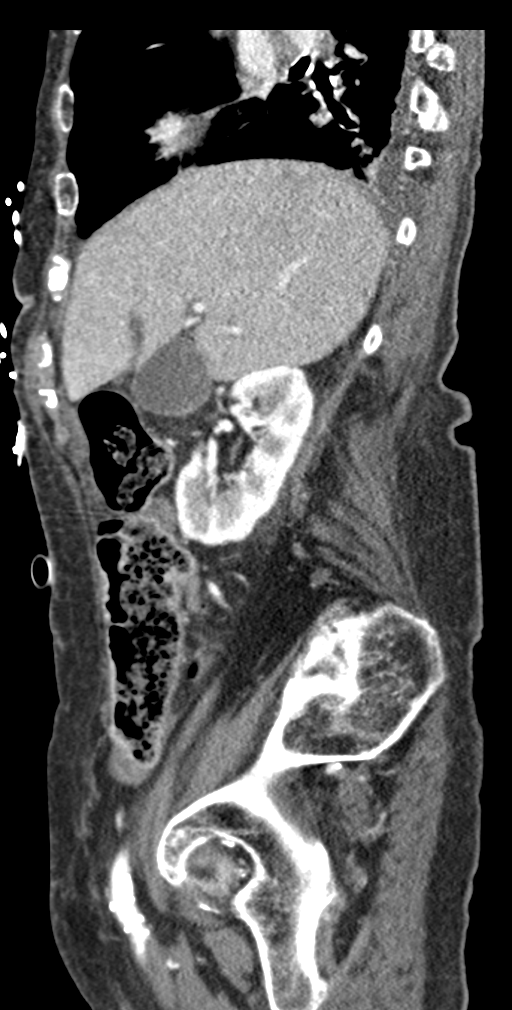

[Series 9: axial st · axial · 0.72mm/px · z∈[+1118,+1433]mm · 7 of 85 slices shown, 12 images]
[im 11/85  soft-tissue]
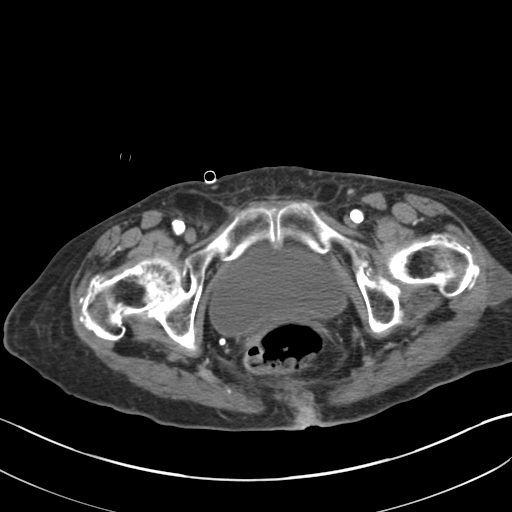
[im 11/85  bone]
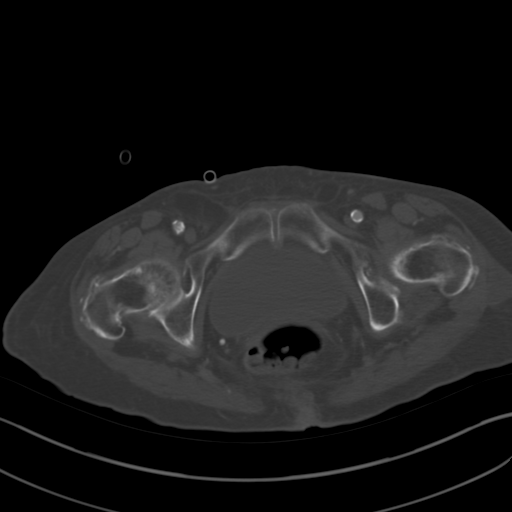
[im 22/85  soft-tissue]
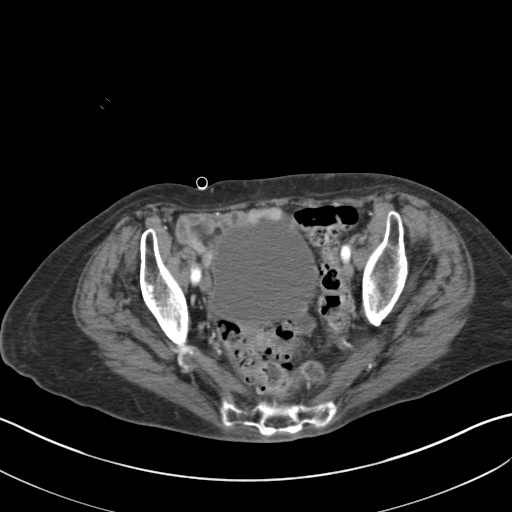
[im 32/85  soft-tissue]
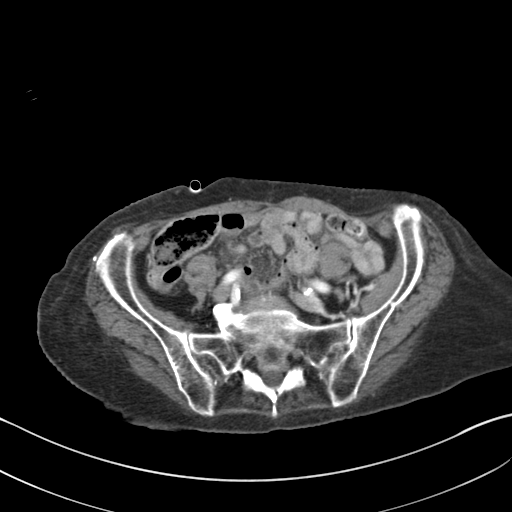
[im 43/85  soft-tissue]
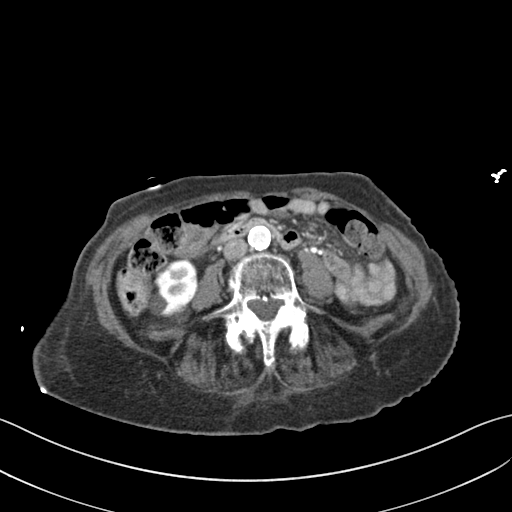
[im 43/85  lung]
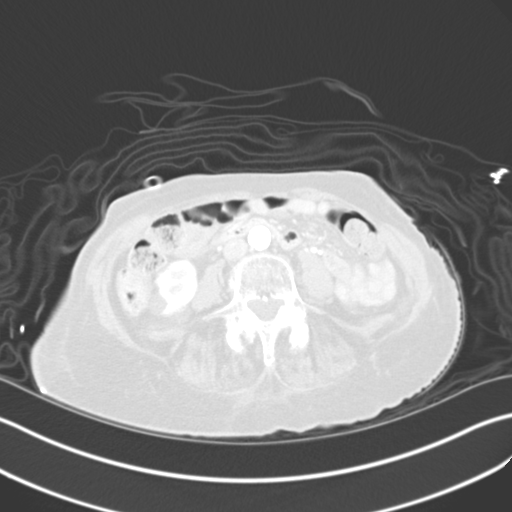
[im 53/85  soft-tissue]
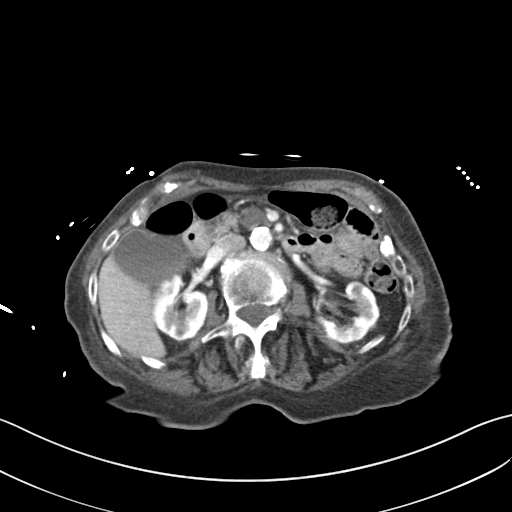
[im 53/85  lung]
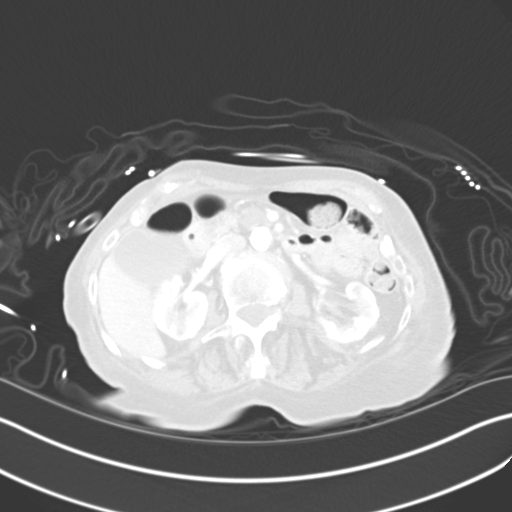
[im 64/85  soft-tissue]
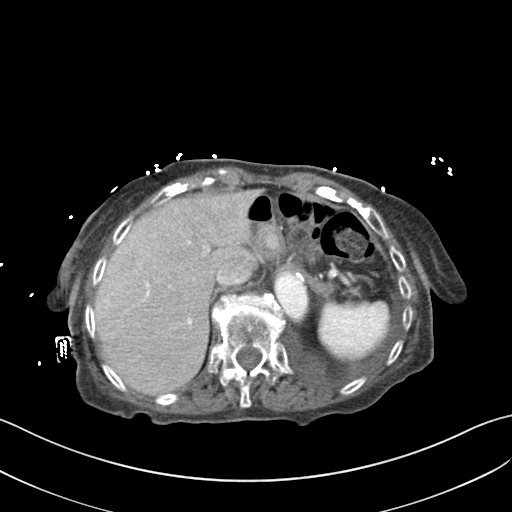
[im 64/85  lung]
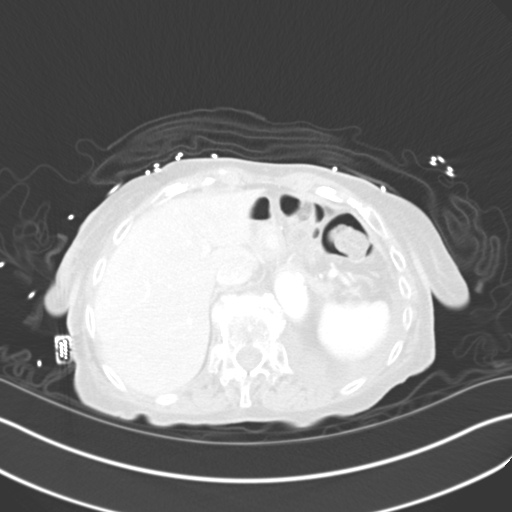
[im 74/85  soft-tissue]
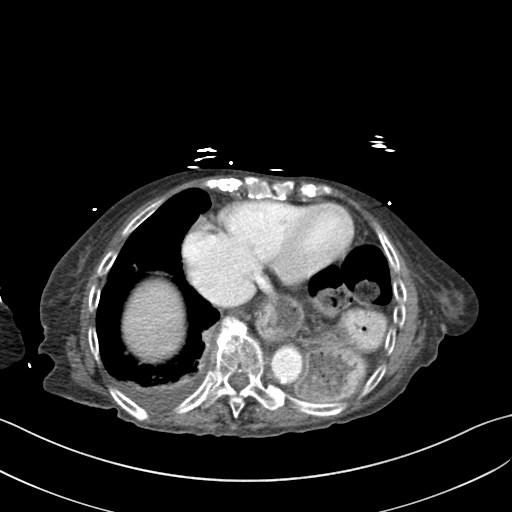
[im 74/85  lung]
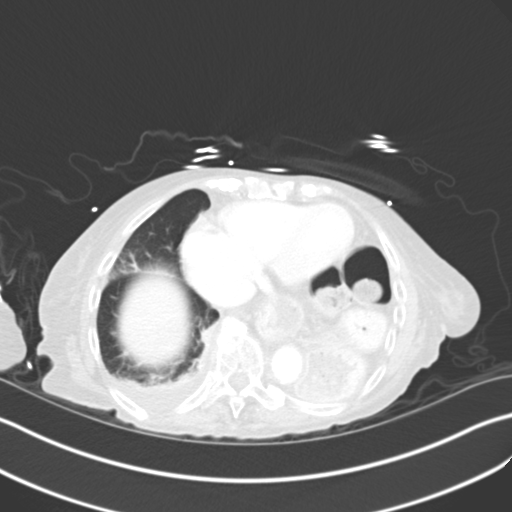

[11 of 46 positions shown; findings below may reference images not displayed]

FINDINGS: Lower chest: Small right-sided pleural effusion 14 mm right middle
lobe pulmonary nodule near the hilum, series 6, image number 8,
previously 13 mm. Large left-sided hernia containing stomach,
mesentery and colon. Incompletely visualized filling defects within
the distal right pulmonary artery, extending into the right lower
lobe pulmonary arteries consistent with acute pulmonary embolus.

Hepatobiliary: No focal liver abnormality is seen. Punctate stones
within the gallbladder. No biliary dilatation.

Pancreas: Atrophic. No inflammatory change. 1.6 x 2.5 cm cystic
lesion near the uncinate process of the pancreas.

Spleen: Normal in size without focal abnormality.

Adrenals/Urinary Tract: Adrenal glands are normal. There are cysts
within the bilateral kidneys with additional subcentimeter hypodense
lesions too small to further characterize. Urinary bladder is
unremarkable

Stomach/Bowel: Stomach herniated into the left chest. No dilated
small bowel. No bowel wall thickening. Negative appendix. Descending
and sigmoid colon diverticular disease without acute inflammatory
change.

Vascular/Lymphatic: Moderate severe aortic atherosclerosis. No
aneurysm. No suspicious nodes.

Reproductive: Status post hysterectomy. No adnexal masses.

Other: Negative for free air or free fluid.

Musculoskeletal: Degenerative changes of the lumbar spine.
Anterolisthesis L4 on L5, L3 on L4 with retrolisthesis L2 on L1. No
acute osseous abnormality.
IMPRESSION: 1. Incompletely visualized filling defects within the distal right
pulmonary artery, extending into the right lower lobe pulmonary
arteries consistent with acute pulmonary embolus.
2. Small right-sided pleural effusion. 14 mm right middle lobe
pulmonary nodule, previously 13 mm.
3. Large left-sided hernia containing stomach, mesentery, and colon.
No evidence for bowel obstruction.
4. Colonic diverticular disease without acute inflammatory change.
5. Cholelithiasis.
6. 2.5 cm cystic lesion near the uncinate process of the pancreas,
intermittently visualized, slowly enlarging over time. Recommend
clinical management given patient age.

Critical Value/emergent results were called by telephone at the time
of interpretation on 11/16/2019 at [DATE] to provider TUVIA SEPIASHVILI
, who verbally acknowledged these results.

Aortic Atherosclerosis ([id number].Z).

## 2022-04-30 ENCOUNTER — Other Ambulatory Visit: Payer: Self-pay

## 2022-04-30 ENCOUNTER — Emergency Department (HOSPITAL_COMMUNITY): Payer: Medicare Other

## 2022-04-30 ENCOUNTER — Encounter (HOSPITAL_COMMUNITY): Payer: Self-pay

## 2022-04-30 ENCOUNTER — Inpatient Hospital Stay (HOSPITAL_COMMUNITY)
Admission: EM | Admit: 2022-04-30 | Discharge: 2022-05-06 | DRG: 175 | Disposition: A | Discharge status: Skilled Nursing Facility | Payer: Medicare Other | Attending: Internal Medicine | Admitting: Internal Medicine

## 2022-04-30 DIAGNOSIS — Y9301 Activity, walking, marching and hiking: Secondary | ICD-10-CM | POA: Diagnosis present

## 2022-04-30 DIAGNOSIS — Z9842 Cataract extraction status, left eye: Secondary | ICD-10-CM

## 2022-04-30 DIAGNOSIS — Z96612 Presence of left artificial shoulder joint: Secondary | ICD-10-CM | POA: Diagnosis present

## 2022-04-30 DIAGNOSIS — K219 Gastro-esophageal reflux disease without esophagitis: Secondary | ICD-10-CM | POA: Diagnosis present

## 2022-04-30 DIAGNOSIS — T17908A Unspecified foreign body in respiratory tract, part unspecified causing other injury, initial encounter: Secondary | ICD-10-CM | POA: Diagnosis not present

## 2022-04-30 DIAGNOSIS — W449XXA Unspecified foreign body entering into or through a natural orifice, initial encounter: Secondary | ICD-10-CM | POA: Diagnosis not present

## 2022-04-30 DIAGNOSIS — S32020A Wedge compression fracture of second lumbar vertebra, initial encounter for closed fracture: Secondary | ICD-10-CM

## 2022-04-30 DIAGNOSIS — I2699 Other pulmonary embolism without acute cor pulmonale: Principal | ICD-10-CM | POA: Diagnosis present

## 2022-04-30 DIAGNOSIS — N1831 Chronic kidney disease, stage 3a: Secondary | ICD-10-CM | POA: Diagnosis not present

## 2022-04-30 DIAGNOSIS — Y92009 Unspecified place in unspecified non-institutional (private) residence as the place of occurrence of the external cause: Secondary | ICD-10-CM

## 2022-04-30 DIAGNOSIS — R0902 Hypoxemia: Secondary | ICD-10-CM | POA: Diagnosis not present

## 2022-04-30 DIAGNOSIS — Z86711 Personal history of pulmonary embolism: Secondary | ICD-10-CM

## 2022-04-30 DIAGNOSIS — R109 Unspecified abdominal pain: Secondary | ICD-10-CM | POA: Diagnosis present

## 2022-04-30 DIAGNOSIS — Z91013 Allergy to seafood: Secondary | ICD-10-CM

## 2022-04-30 DIAGNOSIS — M545 Low back pain, unspecified: Secondary | ICD-10-CM

## 2022-04-30 DIAGNOSIS — Y92239 Unspecified place in hospital as the place of occurrence of the external cause: Secondary | ICD-10-CM | POA: Diagnosis not present

## 2022-04-30 DIAGNOSIS — J9811 Atelectasis: Secondary | ICD-10-CM | POA: Diagnosis present

## 2022-04-30 DIAGNOSIS — N390 Urinary tract infection, site not specified: Secondary | ICD-10-CM

## 2022-04-30 DIAGNOSIS — J189 Pneumonia, unspecified organism: Secondary | ICD-10-CM

## 2022-04-30 DIAGNOSIS — E782 Mixed hyperlipidemia: Secondary | ICD-10-CM | POA: Diagnosis present

## 2022-04-30 DIAGNOSIS — J9601 Acute respiratory failure with hypoxia: Secondary | ICD-10-CM | POA: Diagnosis present

## 2022-04-30 DIAGNOSIS — I951 Orthostatic hypotension: Secondary | ICD-10-CM | POA: Diagnosis present

## 2022-04-30 DIAGNOSIS — Z9841 Cataract extraction status, right eye: Secondary | ICD-10-CM

## 2022-04-30 DIAGNOSIS — Z1152 Encounter for screening for COVID-19: Secondary | ICD-10-CM

## 2022-04-30 DIAGNOSIS — S32029A Unspecified fracture of second lumbar vertebra, initial encounter for closed fracture: Secondary | ICD-10-CM

## 2022-04-30 DIAGNOSIS — Z888 Allergy status to other drugs, medicaments and biological substances status: Secondary | ICD-10-CM

## 2022-04-30 DIAGNOSIS — W19XXXA Unspecified fall, initial encounter: Principal | ICD-10-CM

## 2022-04-30 DIAGNOSIS — I10 Essential (primary) hypertension: Secondary | ICD-10-CM

## 2022-04-30 DIAGNOSIS — Z66 Do not resuscitate: Secondary | ICD-10-CM | POA: Diagnosis present

## 2022-04-30 DIAGNOSIS — Z7901 Long term (current) use of anticoagulants: Secondary | ICD-10-CM

## 2022-04-30 DIAGNOSIS — Z882 Allergy status to sulfonamides status: Secondary | ICD-10-CM

## 2022-04-30 DIAGNOSIS — I252 Old myocardial infarction: Secondary | ICD-10-CM

## 2022-04-30 DIAGNOSIS — E039 Hypothyroidism, unspecified: Secondary | ICD-10-CM | POA: Diagnosis present

## 2022-04-30 DIAGNOSIS — Z9181 History of falling: Secondary | ICD-10-CM

## 2022-04-30 DIAGNOSIS — W010XXA Fall on same level from slipping, tripping and stumbling without subsequent striking against object, initial encounter: Secondary | ICD-10-CM | POA: Diagnosis present

## 2022-04-30 DIAGNOSIS — Z85820 Personal history of malignant melanoma of skin: Secondary | ICD-10-CM

## 2022-04-30 DIAGNOSIS — I447 Left bundle-branch block, unspecified: Secondary | ICD-10-CM | POA: Diagnosis present

## 2022-04-30 DIAGNOSIS — I129 Hypertensive chronic kidney disease with stage 1 through stage 4 chronic kidney disease, or unspecified chronic kidney disease: Secondary | ICD-10-CM | POA: Diagnosis present

## 2022-04-30 DIAGNOSIS — Z79899 Other long term (current) drug therapy: Secondary | ICD-10-CM

## 2022-04-30 DIAGNOSIS — F32A Depression, unspecified: Secondary | ICD-10-CM | POA: Diagnosis present

## 2022-04-30 DIAGNOSIS — L89156 Pressure-induced deep tissue damage of sacral region: Secondary | ICD-10-CM | POA: Diagnosis present

## 2022-04-30 LAB — CBC WITH DIFFERENTIAL/PLATELET
Abs Immature Granulocytes: 0.07 10*3/uL (ref 0.00–0.07)
Basophils Absolute: 0 10*3/uL (ref 0.0–0.1)
Basophils Relative: 0 %
Eosinophils Absolute: 0.1 10*3/uL (ref 0.0–0.5)
Eosinophils Relative: 1 %
HCT: 39.8 % (ref 36.0–46.0)
Hemoglobin: 12.9 g/dL (ref 12.0–15.0)
Immature Granulocytes: 1 %
Lymphocytes Relative: 5 %
Lymphs Abs: 0.7 10*3/uL (ref 0.7–4.0)
MCH: 31.5 pg (ref 26.0–34.0)
MCHC: 32.4 g/dL (ref 30.0–36.0)
MCV: 97.3 fL (ref 80.0–100.0)
Monocytes Absolute: 1.2 10*3/uL — ABNORMAL HIGH (ref 0.1–1.0)
Monocytes Relative: 9 %
Neutro Abs: 11.1 10*3/uL — ABNORMAL HIGH (ref 1.7–7.7)
Neutrophils Relative %: 84 %
Platelets: 230 10*3/uL (ref 150–400)
RBC: 4.09 MIL/uL (ref 3.87–5.11)
RDW: 14.1 % (ref 11.5–15.5)
WBC: 13.1 10*3/uL — ABNORMAL HIGH (ref 4.0–10.5)
nRBC: 0 % (ref 0.0–0.2)

## 2022-04-30 LAB — PROTIME-INR
INR: 1 (ref 0.8–1.2)
Prothrombin Time: 13.1 seconds (ref 11.4–15.2)

## 2022-04-30 LAB — URINALYSIS, ROUTINE W REFLEX MICROSCOPIC
Bilirubin Urine: NEGATIVE
Glucose, UA: NEGATIVE mg/dL
Ketones, ur: 5 mg/dL — AB
Nitrite: POSITIVE — AB
Protein, ur: 100 mg/dL — AB
Specific Gravity, Urine: 1.02 (ref 1.005–1.030)
pH: 5 (ref 5.0–8.0)

## 2022-04-30 LAB — RESP PANEL BY RT-PCR (RSV, FLU A&B, COVID)  RVPGX2
Influenza A by PCR: NEGATIVE
Influenza B by PCR: NEGATIVE
Resp Syncytial Virus by PCR: NEGATIVE
SARS Coronavirus 2 by RT PCR: NEGATIVE

## 2022-04-30 MED ORDER — FUROSEMIDE 10 MG/ML IJ SOLN
20.0000 mg | Freq: Once | INTRAMUSCULAR | Status: AC
  Administered 2022-04-30: 20 mg via INTRAVENOUS
  Filled 2022-04-30: qty 4

## 2022-04-30 MED ORDER — ACETAMINOPHEN 325 MG PO TABS
650.0000 mg | ORAL_TABLET | Freq: Once | ORAL | Status: AC
  Administered 2022-04-30: 650 mg via ORAL
  Filled 2022-04-30: qty 2

## 2022-04-30 MED ORDER — FOSFOMYCIN TROMETHAMINE 3 G PO PACK: 3.0000 g | PACK | Freq: Once | ORAL | Status: DC

## 2022-04-30 MED ORDER — AMLODIPINE BESYLATE 5 MG PO TABS
5.0000 mg | ORAL_TABLET | Freq: Once | ORAL | Status: AC
  Administered 2022-04-30: 5 mg via ORAL
  Filled 2022-04-30: qty 1

## 2022-04-30 MED ORDER — TRAMADOL HCL 50 MG PO TABS
50.0000 mg | ORAL_TABLET | Freq: Once | ORAL | Status: AC
  Administered 2022-04-30: 50 mg via ORAL
  Filled 2022-04-30: qty 1

## 2022-04-30 MED ORDER — SODIUM CHLORIDE 0.9 % IV SOLN
500.0000 mg | INTRAVENOUS | Status: DC
  Administered 2022-04-30: 500 mg via INTRAVENOUS
  Filled 2022-04-30: qty 5

## 2022-04-30 MED ORDER — SODIUM CHLORIDE 0.9 % IV SOLN
2.0000 g | INTRAVENOUS | Status: DC
  Administered 2022-04-30: 2 g via INTRAVENOUS
  Filled 2022-04-30: qty 20

## 2022-04-30 NOTE — Subjective & Objective (Signed)
CC: fall at home HPI: 89 year old Caucasian female history of CKD stage IIIa, history of hypertension not treated due to history of orthostatic hypotension, history of pulmonary embolism on Eliquis, history of DNR presents to the ER today after fall while at home.  Unclear who called EMS for her.  Per triage note, patient tripped and fell after walking to the bathroom.  She reported to the triage nurse she was complaining of back pain however she is currently denying any pain right now.  She is noted to have room air saturations of 87% was placed on oxygen.  On arrival temp 97.7 heart rate 83 blood pressure 178/117 satting 87% on room air.  Labs showed a white count of 13.1, hemoglobin 12.9, platelets of 230  UA (source of specimen not indicated) showed positive nitrates, small leukocyte esterase, many bacteria.  CT of her chest showed a large pelvis esophagus and a hiatal hernia with compressive atelectasis of the left lung base due to the hiatal hernia.  CT lumbar spine showed a new sclerotic lesion in the L2 vertebra that could be an acute fracture.  Radiologist recommended MRI to assess for bone marrow edema.  EKG showed normal sinus rhythm, left bundle branch block.  Due to patient's hypoxic respiratory failure, [facility] hospitalist contacted for admission.  Patient's abnormal UA was treated with 1 dose of fosfomycin.

## 2022-04-30 NOTE — Assessment & Plan Note (Addendum)
Strict intake and output monitoring Creatinine near baseline Minimizing nephrotoxic agents as much as possible Serial chemistries to monitor renal function and electrolytes  

## 2022-04-30 NOTE — Assessment & Plan Note (Addendum)
Persisting oxygen requirement  No evidence of pneumonia based on review of serial chest imaging and normal procalcitonin That being said, D-dimer has been found to be markedly elevated in this patient on a subtherapeutic regimen of 2.5 Eliquis daily and history of pulmonary embolism in the past Markedly elevated D-dimer discussed with son on 1/20.  He opted to proceed with VQ scan instead of contrast CT imaging due to borderline renal function Patient currently on empiric regimen of heparin Awaiting VQ scan which should be performed on 1/22 Will transition to alternative anticoagulation if VQ scan confirms pulmonary embolism As needed bronchodilator therapy in the meantime Continued supplemental oxygen to maintain oxygen saturations above 92%.

## 2022-04-30 NOTE — ED Triage Notes (Addendum)
Patient BIB [facility] from home. Tripped and fell after walking to the bathroom. Did not hit her head. On Eloquis. Complaining of back pain.  87% room air and does not usually wear oxygen. Placed patient on 2L [place].

## 2022-04-30 NOTE — Assessment & Plan Note (Addendum)
Notable L2 fracture on both CT and MRI imaging Patient denies significant back pain however Vitamin D level pending Since patient is not experiencing significant debility or limited ambulation due to the pain I do not believe she needs a kyphoplasty and conservative management is more appropriate.

## 2022-04-30 NOTE — Assessment & Plan Note (Addendum)
Patient has been taking a potentially subtherapeutic regimen of Eliquis once daily  This places patient at risk of recurrent pulmonary embolism  D-dimer found to be markedly elevated, patient is on heparin infusion and awaiting VQ scan which should be performed morning of 1/22.

## 2022-04-30 NOTE — Assessment & Plan Note (Addendum)
Verified on admission

## 2022-04-30 NOTE — H&P (Addendum)
History and Physical    Charizma Gardiner WJX:[id number] DOB: Feb 16, 1923 DOA: 04/30/2022  DOS: the patient was seen and examined on 04/30/2022  PCP: Michael Boston, MD   Patient coming from: Home  I have personally briefly reviewed patient's old medical records in [place]  CC: fall at home HPI: 89 year old Caucasian female history of CKD stage IIIa, history of hypertension not treated due to history of orthostatic hypotension, history of pulmonary embolism on Eliquis, history of DNR presents to the ER today after fall while at home.  Unclear who called EMS for her.  Per triage note, patient tripped and fell after walking to the bathroom.  She reported to the triage nurse she was complaining of back pain however she is currently denying any pain right now.  She is noted to have room air saturations of 87% was placed on oxygen.  On arrival temp 97.7 heart rate 83 blood pressure 178/117 satting 87% on room air.  Labs showed a white count of 13.1, hemoglobin 12.9, platelets of 230  UA (source of specimen not indicated) showed positive nitrates, small leukocyte esterase, many bacteria.  CT of her chest showed a large pelvis esophagus and a hiatal hernia with compressive atelectasis of the left lung base due to the hiatal hernia.  CT lumbar spine showed a new sclerotic lesion in the L2 vertebra that could be an acute fracture.  Radiologist recommended MRI to assess for bone marrow edema.  EKG showed normal sinus rhythm, left bundle branch block.  Due to patient's hypoxic respiratory failure, [facility] contacted for admission.  Patient's abnormal UA was treated with 1 dose of fosfomycin.   ED Course: CT lumbar spine showed possible acute L2 compression fracture.  Review of Systems:  Review of Systems  Constitutional: Negative.   HENT: Negative.    Eyes: Negative.   Respiratory: Negative.    Cardiovascular: Negative.   Gastrointestinal: Negative.   Genitourinary: Negative.    Musculoskeletal:  Positive for falls.  Skin: Negative.   Neurological: Negative.   Endo/Heme/Allergies: Negative.   Psychiatric/Behavioral: Negative.    All other systems reviewed and are negative.   Past Medical History:  Diagnosis Date   Arthritis    Chronic renal insufficiency, stage III (moderate) (HCC)    Depression    Dyslipidemia    Forgetfulness    GERD (gastroesophageal reflux disease)    takes Omeprazole for Hital Hernia    Hiatal hernia    History of nuclear stress test 2005   persantine; inferoapical ischmeia w/ EF 83%; no CAD at cath   [facility] (hard of hearing)    hearing aids   Hypertension    Hypothyroidism    LBBB (left bundle branch block)    chronic per Dr. Gwenlyn Found   Lung nodule 07/01/2003   right middle lobe   Macular degeneration    Malignant melanoma ([place]) 11/12/2004   L upper arm, MOHS_ right hand, leg   Murmur, cardiac    NSTEMI (non-ST elevated myocardial infarction) ([place]) 03/05/2019   Non-STEMI   Osteoporosis    Pneumonia 06/2018   Pulmonary embolism ([place]) 11/16/2019   Pulmonary embolism    Past Surgical History:  Procedure Laterality Date   BLADDER REPAIR     CARDIAC CATHETERIZATION  2005/2007   normal coronaries    CATARACT EXTRACTION     bilat   coccyx  1951   excision   COLONOSCOPY     knee     right cartilage repair   REVERSE  SHOULDER ARTHROPLASTY Left 08/04/2018   Procedure: REVERSE LEFT SHOULDER ARTHROPLASTY;  Surgeon: Netta Cedars, MD;  Location: [facility];  Service: Orthopedics;  Laterality: Left;   ROTATOR CUFF REPAIR     right   TOTAL ABDOMINAL HYSTERECTOMY W/ BILATERAL SALPINGOOPHORECTOMY     TRANSTHORACIC ECHOCARDIOGRAM  2007   EF=>55%; borderline conc LVH; LA mod dilated; RA mild-mod dilated; mild TR; mild AVR with mildly sclerotic AV leavlefts; mild PV regurg      reports that she has never smoked. She has never used smokeless tobacco. She reports that she does not drink alcohol and does not use drugs.  Allergies   Allergen Reactions   Fish Allergy Hives   Diclofenac Other (See Comments)    unknown   Ibuprofen Other (See Comments)    Affects kidney function   Sulfa Antibiotics     Unknown reaction    Family History  Problem Relation Age of Onset   Cancer Father        NHL   Cancer Maternal Grandmother        rectal   Cancer Daughter        breast   Cancer Maternal Aunt        breast   Cancer Son        melanoma    Prior to Admission medications   MEDICATIONS Reconcile with Patient's ChartMEDICATIONS Medication SIG (Take, Route, Frequency, Duration) Notes Start Date End Date Status  Cetirizine HCl 10 MG 1 tablet Orally Once a day       Active  Vitamin C 100 MG 1 tablet Orally Once a day       Active  Vitamin B Complex-C - as directed Orally       Active  traMADol HCl 50 MG 1 tablet as needed twice a day for knee pain Orally takes at bedtime     Active  Triamcinolone Acetonide 0.025 % Apply daily to scalp spots as needed Externally       Active  Hydrocortisone 2.5 % Apply two times a day to affected area on forehead, face, and behind the ears Externally       Active  Omeprazole 20 MG 1 capsule 30 minutes before morning meal Orally Once a day       Active  Triamcinolone Acetonide 0.1 % Apply to skin neck down liberally 2 times a day for dry itchy skin Externally       Active  Loratadine 10 MG 1 tablet Orally Once a day       Active  Multivitamin         Active  Cranberry Extract 250 MG as directed Orally       Active  Vitamin B12 100 MCG as directed Orally       Active  Simvastatin 20 MG 1 tablet in the evening Orally Once a day   04/16/2022   Active  Eliquis 2.5 MG 1 tablet Orally Twice a day   04/16/2022   Active  Levothyroxine Sodium 88 MCG 1 tablet in the morning on an empty stomach Orally Once a day note dose change 04/23/2022   Active    Physical Exam: Vitals:   04/30/22 2030 04/30/22 2100 04/30/22 2121 04/30/22 2130  BP: (!) 201/106 (!) 181/103  (!) 188/89  Pulse: 79 62   64  Resp: (!) 24 18  (!) 31  Temp:   98.3 F (36.8 C)   TempSrc:   Oral   SpO2: 97% 97%  96%  Physical Exam Vitals and nursing note reviewed.  Constitutional:      Comments: Elderly female. Appears stated age of  45  HENT:     Head: Normocephalic and atraumatic.     Nose: Nose normal.  Cardiovascular:     Rate and Rhythm: Normal rate and regular rhythm.     Pulses: Normal pulses.     Heart sounds: Murmur heard.  Pulmonary:     Effort: Pulmonary effort is normal. No respiratory distress.  Abdominal:     General: Abdomen is flat. Bowel sounds are normal. There is no distension.     Tenderness: There is no abdominal tenderness.  Musculoskeletal:     Right lower leg: No edema.     Left lower leg: No edema.  Skin:    General: Skin is warm and dry.     Capillary Refill: Capillary refill takes less than 2 seconds.  Neurological:     Mental Status: She is oriented to person, place, and time.      Labs on Admission: I have personally reviewed following labs and imaging studies  CBC: Recent Labs  Lab 04/30/22 2009  WBC 13.1*  NEUTROABS 11.1*  HGB 12.9  HCT 39.8  MCV 97.3  PLT 350   Basic Metabolic Panel: No results for input(s): "NA", "K", "CL", "CO2", "GLUCOSE", "BUN", "CREATININE", "CALCIUM", "MG", "PHOS" in the last 168 hours. GFR: CrCl cannot be calculated (Patient's most recent lab result is older than the maximum 21 days allowed.). Liver Function Tests: No results for input(s): "AST", "ALT", "ALKPHOS", "BILITOT", "PROT", "ALBUMIN" in the last 168 hours. No results for input(s): "LIPASE", "AMYLASE" in the last 168 hours. No results for input(s): "AMMONIA" in the last 168 hours. Coagulation Profile: Recent Labs  Lab 04/30/22 1931  INR 1.0   Cardiac Enzymes: No results for input(s): "CKTOTAL", "CKMB", "CKMBINDEX", "TROPONINI", "TROPONINIHS" in the last 168 hours. BNP (last 3 results) No results for input(s): "PROBNP" in the last 8760 hours. HbA1C: No  results for input(s): "HGBA1C" in the last 72 hours. CBG: No results for input(s): "GLUCAP" in the last 168 hours. Lipid Profile: No results for input(s): "CHOL", "HDL", "LDLCALC", "TRIG", "CHOLHDL", "LDLDIRECT" in the last 72 hours. Thyroid Function Tests: No results for input(s): "TSH", "T4TOTAL", "FREET4", "T3FREE", "THYROIDAB" in the last 72 hours. Anemia Panel: No results for input(s): "VITAMINB12", "FOLATE", "FERRITIN", "TIBC", "IRON", "RETICCTPCT" in the last 72 hours. Urine analysis:    Component Value Date/Time   COLORURINE YELLOW 04/30/2022 2021   APPEARANCEUR CLEAR 04/30/2022 2021   LABSPEC 1.020 04/30/2022 2021   PHURINE 5.0 04/30/2022 2021   GLUCOSEU NEGATIVE 04/30/2022 2021   HGBUR MODERATE (A) 04/30/2022 2021   BILIRUBINUR NEGATIVE 04/30/2022 2021   KETONESUR 5 (A) 04/30/2022 2021   PROTEINUR 100 (A) 04/30/2022 2021   UROBILINOGEN 0.2 11/22/2013 2218   NITRITE POSITIVE (A) 04/30/2022 2021   LEUKOCYTESUR SMALL (A) 04/30/2022 2021    Radiological Exams on Admission: I have personally reviewed images CT Lumbar Spine Wo Contrast  Result Date: 04/30/2022 CLINICAL DATA:  pain, fall EXAM: CT LUMBAR SPINE WITHOUT CONTRAST TECHNIQUE: Multidetector CT imaging of the lumbar spine was performed without intravenous contrast administration. Multiplanar CT image reconstructions were also generated. RADIATION DOSE REDUCTION: This exam was performed according to the departmental dose-optimization program which includes automated exposure control, adjustment of the mA and/or kV according to patient size and/or use of iterative reconstruction technique. COMPARISON:  CT renal protocol from February 18, 2019 FINDINGS: Segmentation: 5 non rib-bearing lumbar vertebral  bodies. Alignment: Grade 2 anterolisthesis of L4 on L5 and grade 1 anterolisthesis of L3 on L4. Vertebrae: New linear sclerosis through the L2 vertebral body with cortical buckling anteriorly (for example see series 6, image 25).  Osteopenia. Paraspinal and other soft tissues: Aorta bi-iliac calcific atherosclerosis. Disc levels: Severe facet arthropathy in lower lumbar spine with grade 2 anterolisthesis of L4 on L5 and grade 1 anterolisthesis of L3 on L4. Resulting probable canal and foraminal stenosis bilaterally at L4-L5. IMPRESSION: 1. New linear sclerosis through the L2 vertebral body with cortical buckling anteriorly, suspicious for acute fracture in the setting of known trauma. An MRI could assess for bone marrow edema if clinically warranted. 2. Severe facet arthropathy in lower lumbar spine with grade 2 anterolisthesis of L4 on L5 and grade 1 anterolisthesis of L3 on L4. Resulting probable canal and foraminal stenosis bilaterally at L4-L5. MRI could further characterize if clinically warranted. 3. Osteopenia. Electronically Signed   By: Margaretha Sheffield M.D.   On: 04/30/2022 18:25   CT PELVIS WO CONTRAST  Result Date: 04/30/2022 CLINICAL DATA:  Pain, fall. EXAM: CT PELVIS WITHOUT CONTRAST TECHNIQUE: Multidetector CT imaging of the pelvis was performed following the standard protocol without intravenous contrast. RADIATION DOSE REDUCTION: This exam was performed according to the departmental dose-optimization program which includes automated exposure control, adjustment of the mA and/or kV according to patient size and/or use of iterative reconstruction technique. COMPARISON:  Pelvis CT 06/09/2020 FINDINGS: Urinary Tract:  No abnormality visualized. Bowel: There is sigmoid colon diverticulosis. No acute inflammation. No dilated bowel loops. Vascular/Lymphatic: Are atherosclerotic calcifications of the aorta and iliac arteries. No enlarged lymph nodes are seen. Reproductive:  Uterus is surgically absent. Other: There is no ascites. There are small fat containing inguinal hernias. Musculoskeletal: No focal soft tissue hematoma or fluid collection identified. The bones are diffusely osteopenic which limits evaluation for subtle  nondisplaced fracture. No acute fracture or focal osseous lesion identified. No dislocation. There is grade 1 anterolisthesis at L4-L5. Please see dedicated lumbar spine CT for further description. There are moderate degenerative changes of the pubic symphysis and bilateral hips. There are mild degenerative changes of the sacroiliac joints. IMPRESSION: 1. Limited by osteopenia. No acute fracture or dislocation of the pelvis. 2. Sigmoid colon diverticulosis. Aortic Atherosclerosis (ICD10-I70.0). Electronically Signed   By: Ronney Asters M.D.   On: 04/30/2022 18:21   CT Chest Wo Contrast  Result Date: 04/30/2022 CLINICAL DATA:  Fall.  Pain.  Hypoxia. EXAM: CT CHEST WITHOUT CONTRAST TECHNIQUE: Multidetector CT imaging of the chest was performed following the standard protocol without IV contrast. RADIATION DOSE REDUCTION: This exam was performed according to the departmental dose-optimization program which includes automated exposure control, adjustment of the mA and/or kV according to patient size and/or use of iterative reconstruction technique. COMPARISON:  CT 01/04/2020 and older.  X-ray 06/09/2020 and older. FINDINGS: Cardiovascular: Normal caliber thoracic aorta with diffuse scattered calcified plaque on this non IV contrast exam. Coronary artery calcifications are seen. The heart is nonenlarged. Trace pericardial fluid or thickening. There is some enlargement of the main pulmonary arteries. Please correlate for any history of pulmonary artery hypertension. Mediastinum/Nodes: Anasarca. No specific abnormal lymph node enlargement present in the axillary region on this noncontrast exam. No definite hilar nodes. There are some enlarged mediastinal nodes including precarinal on series 3, image 60 measuring 19 by 17 mm. On the prior exam this was smaller measuring 11 by 9 mm. Additional enlarging nodes identified elsewhere paratracheal. Large hiatal hernia identified  extending into the posterior left hemithorax.  Slightly patulous esophagus above this. There is also focal diaphragmatic defect noted best seen on coronal image 42 of series 7 with herniation of some fat. Lungs/Pleura: Focal noncalcified nodule identified along the posterosuperior aspect of the middle lobe. On the prior this measured 14 mm in dimension and today 14 mm again on series 6, image 79. Please correlate prior workup. This has been stable for over 2 years. Areas of bronchiectasis with wall thickening and mild linear opacity along the right lower lobe is again noted. Slightly more prominent today. There is some similar areas in the posterior right upper lobe. Scattered bilateral areas of interstitial septal thickening and pleural thickening. Slightly more confluent opacity left lower lobe this could be compressive atelectasis related to the adjacent hiatal hernia. This is unchanged from previous. No pneumothorax. Upper Abdomen: Adrenal glands are preserved in the upper abdomen Musculoskeletal: Scattered degenerative changes of the spine with osteopenia. There is streak artifact related to the patient's left shoulder arthroplasty. Old left-sided rib fractures. Advanced degenerative changes of the right shoulder. IMPRESSION: Once again large hiatal hernia with a patulous esophagus. Separate focal diaphragmatic defect more left lateral. Adjacent parenchymal opacity left lung base. Favor compressive atelectasis over infiltrate but correlate with symptoms. Stable 14 mm middle lobe noncalcified nodule which has been stable for least 2 years. Enlarging mediastinal lymph nodes not pathologic in the precarinal space. Please correlate for any known history otherwise a follow-up contrast study may be useful to further delineate when clinically appropriate Aortic Atherosclerosis (ICD10-I70.0) and Emphysema (ICD10-J43.9). Electronically Signed   By: Jill Side M.D.   On: 04/30/2022 18:15    EKG: My personal interpretation of EKG shows: NSR,  LBBB    Assessment/Plan Principal Problem:   Acute respiratory failure with hypoxia (HCC) Active Problems:   Closed compression fracture of L2 vertebra (HCC)   Essential hypertension   Fall at home, initial encounter   Chronic kidney disease, stage 3a ([place])   History of pulmonary embolism   DNR (do not resuscitate)/DNI(Do Not Intubate)    Assessment and Plan: * Acute respiratory failure with hypoxia ([place]) Observation med/surg bed. Unclear the cause. Pt without pna on CT chest. Pt with large hiatal hernia with compressive atelectasis.  Closed compression fracture of L2 vertebra (HCC) L2 fracture seen on CT L-spine. Pt does not complain of any back pain. Will check MRI L-spine to evaluate the chronicity of L2 fracture. May be chronic.  DNR (do not resuscitate)/DNI(Do Not Intubate) Verified DNR/DNI with patient. Also her PCP notes that pt is a DNR. There is no family here. Pt lives alone.  History of pulmonary embolism Has been on Eliquis since 2021. Was on medroxyprogesterone at the time of her PE according to endocrinology notes. Has since stopped hormonal therapy. Given her recent fall, this may be a good time to stop systemic anticoagulation in this frail, elderly patient with known history of falls.  Chronic kidney disease, stage 3a (HCC) Stable.  Fall at home, initial encounter Pt does not remember falling at home. CT head negative. Now may be a good time to stop Eliquis given her advanced age. PE was back in 2021. She was medroxyprogesterone at the time.  Essential hypertension Pt was taken off her HTN meds due to severe orthostatic hypotension. Pt has done well according to prior endocrinology notes and PCP notes. Prn IV labetalol. Would not restart her HTN meds for now. She will need outpatient f/u with her PCP Dr. Jacalyn Lefevre  before restarting HTN meds.   DVT prophylaxis: SCDs Code Status: DNR/DNI(Do NOT Intubate) verified with patient. Family Communication: no family at  bedside  Disposition Plan: return home with [facility]?? Vs SNF??  Consults called: none  Admission status: Observation, Med-Surg   Kristopher Oppenheim, DO [facility] 04/30/2022, 10:25 PM

## 2022-04-30 NOTE — Assessment & Plan Note (Addendum)
Witnessed fall by patient's caregiver at home in the absence of loss of consciousness  Notable L2 fracture based on trauma survey However patient is denying significant back pain Obtain PT evaluation to assess strength and balance with ambulation.

## 2022-04-30 NOTE — ED Provider Notes (Signed)
[facility] Provider Note   CSN: [id number] Arrival date & time: 04/30/22  1629     History  Chief Complaint  Patient presents with   Lytle Michaels    Cindy [name] is a 89 y.o. female.  With PMH of hypothyroidism, CKD, HTN, HLD on Eliquis who presents after a trip and fall at home complaining of low back pain noted to be hypoxic 87% on room air.  Patient's family member is with her and was with her for the fall.  She did not hit her head or lose consciousness.  Her family member was with her for the fall they were walking to the bathroom when she lost her footing and fell backwards into her family member.  They both fell down To the ground from standing.  She did not hit her head or have any whiplash or lose consciousness.  Family member at bedside noted she was complaining of lower back pain and hip pain after the fall.  However she has walked since the fall.  She denies any chest pain, abdominal pain, numbness or tingling or any pain while I am evaluating her.  Family member does note that she has been having a dry cough recently and started on Mucinex but no antibiotics.  She denies any shortness of breath.  No history of COPD or CHF.  Last dose of Eliquis this morning.   Fall       Home Medications Prior to Admission medications   Medication Sig Start Date End Date Taking? Authorizing Provider  acetaminophen (TYLENOL) 325 MG tablet Take 1-2 tablets (325-650 mg total) by mouth every 6 (six) hours as needed for mild pain or moderate pain (pain score 1-3 or temp > 100.5). Patient taking differently: Take 650 mg by mouth in the morning, at noon, and at bedtime. 08/07/18   Netta Cedars, MD  apixaban (ELIQUIS) 2.5 MG TABS tablet Take 1 tablet (2.5 mg total) by mouth 2 (two) times daily. 06/11/20 07/11/20  Thurnell Lose, MD  B Complex Vitamins (B COMPLEX 100 PO) Take 1 tablet by mouth daily.    [provider]  Cholecalciferol (VITAMIN D)  125 MCG (5000 UT) CAPS Take 5,000 Units by mouth daily.    [provider]  CRANBERRY PO Take 1 tablet by mouth daily.     [provider]  glucosamine-chondroitin 500-400 MG tablet Take 1 tablet by mouth daily.    [provider]  hydroxypropyl methylcellulose / hypromellose (ISOPTO TEARS / GONIOVISC) 2.5 % ophthalmic solution Place 1 drop into both eyes daily.    [provider]  levothyroxine (SYNTHROID) 75 MCG tablet Take 75 mcg by mouth daily before breakfast.  07/01/16   [provider]  lidocaine (LIDODERM) 5 % Place 1 patch onto the skin daily. Remove & Discard patch within 12 hours or as directed by MD Patient taking differently: Place 1 patch onto the skin daily. Remove & Discard patch within 12 hours or as directed by MD. Apply to knee 01/07/20   Dahal, Marlowe Aschoff, MD  mirabegron ER (MYRBETRIQ) 50 MG TB24 tablet Take 50 mg by mouth daily.  03/01/17   [provider]  Multiple Vitamins-Minerals (HAIR SKIN & NAILS ADVANCED) TABS Take 3 tablets by mouth daily.    [provider]  Multiple Vitamins-Minerals (OCUVITE EXTRA) TABS Take 1 tablet by mouth daily.    [provider]  omega-3 acid ethyl esters (LOVAZA) 1 g capsule Take 1 g by mouth  daily.     [provider]  omeprazole (PRILOSEC) 20 MG capsule Take 20 mg by mouth daily.    [provider]  polyethylene glycol (MIRALAX) 17 g packet Take 17 g by mouth daily. 03/07/19   Shelly Coss, MD  simvastatin (ZOCOR) 20 MG tablet Take 1 tablet (20 mg total) by mouth every evening. 11/19/19   Shahmehdi, Valeria Batman, MD  solifenacin (VESICARE) 10 MG tablet Take 10 mg by mouth daily. 01/26/19   [provider]  traMADol (ULTRAM) 50 MG tablet Take 50 mg by mouth 2 (two) times daily as needed for moderate pain or severe pain.  09/12/18   [provider]  Triamcinolone Acetonide 0.025 % LOTN Apply 1 application topically every other day. For scalp 08/24/19    [provider]  TURMERIC-GINGER PO Take 1 tablet by mouth daily.    [provider]      Allergies    Fish allergy, Diclofenac, Ibuprofen, and Sulfa antibiotics    Review of Systems   Review of Systems  Physical Exam Updated Vital Signs BP (!) 188/89   Pulse 64   Temp 98.3 F (36.8 C) (Oral)   Resp (!) 31   SpO2 96%  Physical Exam Constitutional: Alert and oriented.  Appears stated age no acute distress Eyes: Conjunctivae are normal. PERRL ENT      Head: Normocephalic and atraumatic.      Nose: No congestion.      Mouth/Throat: Mucous membranes are moist.      Neck: No stridor.  No midline tenderness step-offs or deformities of the C/T/L-spine Cardiovascular: S1, S2, regular rate and rhythm equal palpable radial pulses and PT pulses bilaterally.Warm and well perfused. Respiratory: Mild crackles bilaterally, O2 sat 92, no increased work of breathing, no chest wall tenderness or deformity, no crepitus Gastrointestinal: Soft and nontender.  Musculoskeletal: Normal range of motion in all extremities. No tenderness or deformity in bilateral upper and lower extremities Neurologic: Normal speech and language. PERRL. EOMI. Tongue midline.  No facial droop.  Equal movement of bilateral upper and lower extremities, sensation grossly intact.  No gross focal neurologic deficits are appreciated. Skin: Skin is warm, dry.  No lacerations Psychiatric: Mood and affect are normal. Speech and behavior are normal.  ED Results / Procedures / Treatments   Labs (all labs ordered are listed, but only abnormal results are displayed) Labs Reviewed  URINALYSIS, ROUTINE W REFLEX MICROSCOPIC - Abnormal; Notable for the following components:      Result Value   Hgb urine dipstick MODERATE (*)    Ketones, ur 5 (*)    Protein, ur 100 (*)    Nitrite POSITIVE (*)    Leukocytes,Ua SMALL (*)    Bacteria, UA MANY (*)    All other components within normal limits  CBC WITH  DIFFERENTIAL/PLATELET - Abnormal; Notable for the following components:   WBC 13.1 (*)    Neutro Abs 11.1 (*)    Monocytes Absolute 1.2 (*)    All other components within normal limits  RESP PANEL BY RT-PCR (RSV, FLU A&B, COVID)  RVPGX2  PROTIME-INR  CBC WITH DIFFERENTIAL/PLATELET    EKG EKG Interpretation  Date/Time:  Friday April 30 2022 20:19:34 EST Ventricular Rate:  71 PR Interval:  224 QRS Duration: 148 QT Interval:  459 QTC Calculation: 499 R Axis:   -58 Text Interpretation: Sinus arrhythmia Ventricular premature complex Prolonged PR interval Left bundle branch block , old Sinus rhythm with first-degree block T wave inversions lateral  leads Confirmed by Georgina Snell [phone]) on 04/30/2022 8:41:48 PM  Radiology CT Lumbar Spine Wo Contrast  Result Date: 04/30/2022 CLINICAL DATA:  pain, fall EXAM: CT LUMBAR SPINE WITHOUT CONTRAST TECHNIQUE: Multidetector CT imaging of the lumbar spine was performed without intravenous contrast administration. Multiplanar CT image reconstructions were also generated. RADIATION DOSE REDUCTION: This exam was performed according to the departmental dose-optimization program which includes automated exposure control, adjustment of the mA and/or kV according to patient size and/or use of iterative reconstruction technique. COMPARISON:  CT renal protocol from February 18, 2019 FINDINGS: Segmentation: 5 non rib-bearing lumbar vertebral bodies. Alignment: Grade 2 anterolisthesis of L4 on L5 and grade 1 anterolisthesis of L3 on L4. Vertebrae: New linear sclerosis through the L2 vertebral body with cortical buckling anteriorly (for example see series 6, image 25). Osteopenia. Paraspinal and other soft tissues: Aorta bi-iliac calcific atherosclerosis. Disc levels: Severe facet arthropathy in lower lumbar spine with grade 2 anterolisthesis of L4 on L5 and grade 1 anterolisthesis of L3 on L4. Resulting probable canal and foraminal stenosis bilaterally at L4-L5.  IMPRESSION: 1. New linear sclerosis through the L2 vertebral body with cortical buckling anteriorly, suspicious for acute fracture in the setting of known trauma. An MRI could assess for bone marrow edema if clinically warranted. 2. Severe facet arthropathy in lower lumbar spine with grade 2 anterolisthesis of L4 on L5 and grade 1 anterolisthesis of L3 on L4. Resulting probable canal and foraminal stenosis bilaterally at L4-L5. MRI could further characterize if clinically warranted. 3. Osteopenia. Electronically Signed   By: Margaretha Sheffield M.D.   On: 04/30/2022 18:25   CT PELVIS WO CONTRAST  Result Date: 04/30/2022 CLINICAL DATA:  Pain, fall. EXAM: CT PELVIS WITHOUT CONTRAST TECHNIQUE: Multidetector CT imaging of the pelvis was performed following the standard protocol without intravenous contrast. RADIATION DOSE REDUCTION: This exam was performed according to the departmental dose-optimization program which includes automated exposure control, adjustment of the mA and/or kV according to patient size and/or use of iterative reconstruction technique. COMPARISON:  Pelvis CT 06/09/2020 FINDINGS: Urinary Tract:  No abnormality visualized. Bowel: There is sigmoid colon diverticulosis. No acute inflammation. No dilated bowel loops. Vascular/Lymphatic: Are atherosclerotic calcifications of the aorta and iliac arteries. No enlarged lymph nodes are seen. Reproductive:  Uterus is surgically absent. Other: There is no ascites. There are small fat containing inguinal hernias. Musculoskeletal: No focal soft tissue hematoma or fluid collection identified. The bones are diffusely osteopenic which limits evaluation for subtle nondisplaced fracture. No acute fracture or focal osseous lesion identified. No dislocation. There is grade 1 anterolisthesis at L4-L5. Please see dedicated lumbar spine CT for further description. There are moderate degenerative changes of the pubic symphysis and bilateral hips. There are mild  degenerative changes of the sacroiliac joints. IMPRESSION: 1. Limited by osteopenia. No acute fracture or dislocation of the pelvis. 2. Sigmoid colon diverticulosis. Aortic Atherosclerosis (ICD10-I70.0). Electronically Signed   By: Ronney Asters M.D.   On: 04/30/2022 18:21   CT Chest Wo Contrast  Result Date: 04/30/2022 CLINICAL DATA:  Fall.  Pain.  Hypoxia. EXAM: CT CHEST WITHOUT CONTRAST TECHNIQUE: Multidetector CT imaging of the chest was performed following the standard protocol without IV contrast. RADIATION DOSE REDUCTION: This exam was performed according to the departmental dose-optimization program which includes automated exposure control, adjustment of the mA and/or kV according to patient size and/or use of iterative reconstruction technique. COMPARISON:  CT 01/04/2020 and older.  X-ray 06/09/2020 and older. FINDINGS: Cardiovascular: Normal caliber thoracic aorta with  diffuse scattered calcified plaque on this non IV contrast exam. Coronary artery calcifications are seen. The heart is nonenlarged. Trace pericardial fluid or thickening. There is some enlargement of the main pulmonary arteries. Please correlate for any history of pulmonary artery hypertension. Mediastinum/Nodes: Anasarca. No specific abnormal lymph node enlargement present in the axillary region on this noncontrast exam. No definite hilar nodes. There are some enlarged mediastinal nodes including precarinal on series 3, image 60 measuring 19 by 17 mm. On the prior exam this was smaller measuring 11 by 9 mm. Additional enlarging nodes identified elsewhere paratracheal. Large hiatal hernia identified extending into the posterior left hemithorax. Slightly patulous esophagus above this. There is also focal diaphragmatic defect noted best seen on coronal image 42 of series 7 with herniation of some fat. Lungs/Pleura: Focal noncalcified nodule identified along the posterosuperior aspect of the middle lobe. On the prior this measured 14 mm in  dimension and today 14 mm again on series 6, image 79. Please correlate prior workup. This has been stable for over 2 years. Areas of bronchiectasis with wall thickening and mild linear opacity along the right lower lobe is again noted. Slightly more prominent today. There is some similar areas in the posterior right upper lobe. Scattered bilateral areas of interstitial septal thickening and pleural thickening. Slightly more confluent opacity left lower lobe this could be compressive atelectasis related to the adjacent hiatal hernia. This is unchanged from previous. No pneumothorax. Upper Abdomen: Adrenal glands are preserved in the upper abdomen Musculoskeletal: Scattered degenerative changes of the spine with osteopenia. There is streak artifact related to the patient's left shoulder arthroplasty. Old left-sided rib fractures. Advanced degenerative changes of the right shoulder. IMPRESSION: Once again large hiatal hernia with a patulous esophagus. Separate focal diaphragmatic defect more left lateral. Adjacent parenchymal opacity left lung base. Favor compressive atelectasis over infiltrate but correlate with symptoms. Stable 14 mm middle lobe noncalcified nodule which has been stable for least 2 years. Enlarging mediastinal lymph nodes not pathologic in the precarinal space. Please correlate for any known history otherwise a follow-up contrast study may be useful to further delineate when clinically appropriate Aortic Atherosclerosis (ICD10-I70.0) and Emphysema (ICD10-J43.9). Electronically Signed   By: Jill Side M.D.   On: 04/30/2022 18:15    Procedures .Critical Care  Performed by: Elgie Congo, MD Authorized by: Elgie Congo, MD   Critical care provider statement:    Critical care time (minutes):  30   Critical care was necessary to treat or prevent imminent or life-threatening deterioration of the following conditions:  Respiratory failure   Critical care was time spent personally  by me on the following activities:  Development of treatment plan with patient or surrogate, discussions with consultants, evaluation of patient's response to treatment, examination of patient, ordering and review of laboratory studies, ordering and review of radiographic studies, ordering and performing treatments and interventions, pulse oximetry, re-evaluation of patient's condition, review of old charts and obtaining history from patient or surrogate   Care discussed with: admitting provider       Medications Ordered in ED Medications  cefTRIAXone (ROCEPHIN) 2 g in sodium chloride 0.9 % 100 mL IVPB (2 g Intravenous New Bag/Given 04/30/22 2126)  azithromycin (ZITHROMAX) 500 mg in sodium chloride 0.9 % 250 mL IVPB (has no administration in time range)  traMADol (ULTRAM) tablet 50 mg (50 mg Oral Given 04/30/22 1905)  acetaminophen (TYLENOL) tablet 650 mg (650 mg Oral Given 04/30/22 1905)  furosemide (LASIX) injection 20 mg (20  mg Intravenous Given 04/30/22 2127)  amLODipine (NORVASC) tablet 5 mg (5 mg Oral Given 04/30/22 2127)    ED Course/ Medical Decision Making/ A&P Clinical Course as of 04/30/22 2156  Fri Apr 30, 2022  2135 Case discussed with Dr. Bridgett Larsson who will evaluate patient in the next couple of hours and then place orders for admission.  Her blood pressure is improving after amlodipine and Lasix.  Managing his hypertensive urgency as she is asymptomatic and has no acute findings concerning for hypertensive emergency. [VB]    Clinical Course User Index [VB] Elgie Congo, MD   {                            Medical Decision Making Conita Amenta is a 89 y.o. female.  With PMH of hypothyroidism, CKD, HTN, HLD on Eliquis who presents after a trip and fall at home complaining of low back pain noted to be hypoxic 87% on room air.  Patient had mechanical low mechanism fall from standing.  Family member was present for fall and she had no head trauma or loss of consciousness and no focal  deficits on exam.  Deferring head CT and CT C-spine due to this reason.  On exam she is denying pain anywhere however due to her reports of hip pain and lower back pain to family member will obtain CT L-spine and pelvis.  Additionally, she is borderline hypoxic on exam with reported recent dry cough and viral URI-like symptoms.  Will obtain basic labs, viral swabs and CT chest to further evaluate for evidence of bacterial pneumonia versus viral URI.  Reviewed patient's chest CT notable for changes concerning for left lower lobe opacity which I favor to be pneumonia with infectious symptoms and leukocytosis 13.1 with left shift as well as hypoxia on new 2 L nasal cannula.  Started patient on Rocephin and azithromycin.  Also patient asymptomatic hypertension with SBP greater than 200 and DBP greater than 100 so ordered for amlodipine and IV Lasix as findings concerning for pulmonary hypertension on CT chest.  Her viral swabs were negative.  Her CT pelvis was negative for hip fracture but CT L-spine did show evidence of changes concerning for new L2 vertebral fracture.  UA also with many bacteria small leukocyte esterase and positive nitrate concerning for possible concurrent UTI which will be covered with antibiotics for pneumonia.  Spoke with Dr. Bridgett Larsson who will put in orders for admission and management of new hypoxic respiratory failure in the setting of suspected pneumonia as well as new findings suggestive of pulmonary hypertension, hypertensive urgency, UTI and new L2 fracture from fall.  Amount and/or Complexity of Data Reviewed Labs: ordered. Radiology: ordered.  Risk OTC drugs. Prescription drug management.   Final Clinical Impression(s) / ED Diagnoses Final diagnoses:  Fall, initial encounter  Acute low back pain without sciatica, unspecified back pain laterality  Closed fracture of second lumbar vertebra, unspecified fracture morphology, initial encounter ([place])  Urinary tract infection  without hematuria, site unspecified  Pneumonia of left lower lobe due to infectious organism  Hypoxia    Rx / DC Orders ED Discharge Orders     None         Elgie Congo, MD 04/30/22 2156

## 2022-04-30 NOTE — Assessment & Plan Note (Addendum)
Conservative management with conservative targets due to known history of severe orthostatic hypotension  As needed intravenous antihypertensives for markedly elevated blood pressure.

## 2022-04-30 NOTE — ED Notes (Signed)
ED TO INPATIENT HANDOFF REPORT  Name/Age/Gender Cindy [name] 89 y.o. female  Code Status    Code Status Orders  (From admission, onward)           Start     Ordered   04/30/22 2232  Do not attempt resuscitation (DNR)  Continuous       Question Answer Comment  If patient has no pulse and is not breathing Do Not Attempt Resuscitation   If patient has a pulse and/or is breathing: Medical Treatment Goals LIMITED ADDITIONAL INTERVENTIONS: Use medication/IV fluids and cardiac monitoring as indicated; Do not use intubation or mechanical ventilation (DNI), also provide comfort medications.  Transfer to Progressive/Stepdown as indicated, avoid Intensive Care.   Consent: Discussion documented in EHR or advanced directives reviewed      04/30/22 2233           Code Status History     Date Active Date Inactive Code Status Order ID Comments User Context   04/30/2022 2225 04/30/2022 2233 DNR [id number]  Kristopher Oppenheim, DO ED   06/09/2020 0659 06/10/2020 1858 DNR [id number]  Vernelle Emerald, MD ED   01/04/2020 2042 01/07/2020 1906 DNR [id number]  Benny Lennert, [place], DO ED   11/17/2019 0037 11/19/2019 1853 DNR [id number]  Toy Baker, MD ED   11/16/2019 2258 11/17/2019 0037 DNR [id number]  Wyvonnia Dusky, MD ED   03/05/2019 1957 03/07/2019 1648 DNR [id number]  Elwyn Reach, MD ED   08/04/2018 1335 08/07/2018 1823 Full Code [id number]  Netta Cedars, MD Inpatient   06/13/2018 2036 06/16/2018 1648 Full Code [id number]  Elwyn Reach, MD Inpatient   11/08/2016 0552 11/08/2016 1826 DNR [id number]  Rise Patience, MD Inpatient   06/28/2016 0355 07/01/2016 1848 DNR [id number]  Rise Patience, MD Inpatient       Home/SNF/Other Home/ with caregiver   Chief Complaint Acute respiratory failure with hypoxia ([place]) [J96.01]  Level of Care/Admitting Diagnosis ED Disposition     ED Disposition  Admit   Condition  --   Comment  Hospital Area: [facility] [[id number]]  Level of  Care: Med-Surg [[id number]]  May place patient in observation at [facility] or [facility] if equivalent level of care is available:: No  Covid Evaluation: Asymptomatic - no recent exposure (last 10 days) testing not required  Diagnosis: Acute respiratory failure with hypoxia [facility]) [[id number]]  Admitting Physician: Bridgett Larsson, [place]  Attending Physician: Bridgett Larsson, ERIC [[id number]]          Medical History Past Medical History:  Diagnosis Date   Arthritis    Chronic renal insufficiency, stage III (moderate) (HCC)    Depression    Dyslipidemia    Forgetfulness    GERD (gastroesophageal reflux disease)    takes Omeprazole for Hital Hernia    Hiatal hernia    History of nuclear stress test 2005   persantine; inferoapical ischmeia w/ EF 83%; no CAD at cath   [facility] (hard of hearing)    hearing aids   Hypertension    Hypothyroidism    LBBB (left bundle branch block)    chronic per Dr. Gwenlyn Found   Lung nodule 07/01/2003   right middle lobe   Macular degeneration    Malignant melanoma ([place]) 11/12/2004   L upper arm, MOHS_ right hand, leg   Murmur, cardiac    NSTEMI (non-ST elevated myocardial infarction) ([place]) 03/05/2019   Non-STEMI   Osteoporosis    Pneumonia 06/2018   Pulmonary embolism ([place]) 11/16/2019   Pulmonary embolism  Allergies Allergies  Allergen Reactions   Fish Allergy Hives   Diclofenac Other (See Comments)    unknown   Ibuprofen Other (See Comments)    Affects kidney function   Sulfa Antibiotics     Unknown reaction    IV Location/Drains/Wounds Patient Lines/Drains/Airways Status     Active Line/Drains/Airways     Name Placement date Placement time Site Days   Peripheral IV 04/30/22 22 G Right Antecubital 04/30/22  2015  Antecubital  less than 1   Wound / Incision (Open or Dehisced) 06/09/20 Face Lateral;Left;Upper 06/09/20  0720  Face  690   Wound / Incision (Open or Dehisced) 06/10/20 Skin tear Finger (Comment which one) Anterior;Right 06/10/20  0800  Finger (Comment  which one)  689            Labs/Imaging Results for orders placed or performed during the hospital encounter of 04/30/22 (from the past 48 hour(s))  Resp panel by RT-PCR (RSV, Flu A&B, Covid) Anterior Nasal Swab     Status: None   Collection Time: 04/30/22  6:59 PM   Specimen: Anterior Nasal Swab  Result Value Ref Range   SARS Coronavirus 2 by RT PCR NEGATIVE NEGATIVE    Comment: (NOTE) SARS-CoV-2 target nucleic acids are NOT DETECTED.  The SARS-CoV-2 RNA is generally detectable in upper respiratory specimens during the acute phase of infection. The lowest concentration of SARS-CoV-2 viral copies this assay can detect is 138 copies/mL. A negative result does not preclude SARS-Cov-2 infection and should not be used as the sole basis for treatment or other patient management decisions. A negative result may occur with  improper specimen collection/handling, submission of specimen other than nasopharyngeal swab, presence of viral mutation(s) within the areas targeted by this assay, and inadequate number of viral copies(<138 copies/mL). A negative result must be combined with clinical observations, patient history, and epidemiological information. The expected result is Negative.  Fact Sheet for Patients:  EntrepreneurPulse.com.au  Fact Sheet for Healthcare Providers:  IncredibleEmployment.be  This test is no t yet approved or cleared by the [place] FDA and  has been authorized for detection and/or diagnosis of SARS-CoV-2 by FDA under an Emergency Use Authorization (EUA). This EUA will remain  in effect (meaning this test can be used) for the duration of the COVID-19 declaration under Section 564(b)(1) of the Act, 21 U.S.C.section 360bbb-3(b)(1), unless the authorization is terminated  or revoked sooner.       Influenza A by PCR NEGATIVE NEGATIVE   Influenza B by PCR NEGATIVE NEGATIVE    Comment: (NOTE) The Xpert Xpress  SARS-CoV-2/FLU/RSV plus assay is intended as an aid in the diagnosis of influenza from Nasopharyngeal swab specimens and should not be used as a sole basis for treatment. Nasal washings and aspirates are unacceptable for Xpert Xpress SARS-CoV-2/FLU/RSV testing.  Fact Sheet for Patients: EntrepreneurPulse.com.au  Fact Sheet for Healthcare Providers: IncredibleEmployment.be  This test is not yet approved or cleared by the [place] FDA and has been authorized for detection and/or diagnosis of SARS-CoV-2 by FDA under an Emergency Use Authorization (EUA). This EUA will remain in effect (meaning this test can be used) for the duration of the COVID-19 declaration under Section 564(b)(1) of the Act, 21 U.S.C. section 360bbb-3(b)(1), unless the authorization is terminated or revoked.     Resp Syncytial Virus by PCR NEGATIVE NEGATIVE    Comment: (NOTE) Fact Sheet for Patients: EntrepreneurPulse.com.au  Fact Sheet for Healthcare Providers: IncredibleEmployment.be  This test is not yet approved or cleared  by the [place] and has been authorized for detection and/or diagnosis of SARS-CoV-2 by FDA under an Emergency Use Authorization (EUA). This EUA will remain in effect (meaning this test can be used) for the duration of the COVID-19 declaration under Section 564(b)(1) of the Act, 21 U.S.C. section 360bbb-3(b)(1), unless the authorization is terminated or revoked.  Performed at [facility], [place] [address] [place], [place] [address]   Protime-INR     Status: None   Collection Time: 04/30/22  7:31 PM  Result Value Ref Range   Prothrombin Time 13.1 11.4 - 15.2 seconds   INR 1.0 0.8 - 1.2    Comment: (NOTE) INR goal varies based on device and disease states. Performed at [facility], [place] [address] [place], [place] [address]   CBC with Differential/Platelet      Status: Abnormal   Collection Time: 04/30/22  8:09 PM  Result Value Ref Range   WBC 13.1 (H) 4.0 - 10.5 K/uL   RBC 4.09 3.87 - 5.11 MIL/uL   Hemoglobin 12.9 12.0 - 15.0 g/dL   HCT 39.8 36.0 - 46.0 %   MCV 97.3 80.0 - 100.0 fL   MCH 31.5 26.0 - 34.0 pg   MCHC 32.4 30.0 - 36.0 g/dL   RDW 14.1 11.5 - 15.5 %   Platelets 230 150 - 400 K/uL   nRBC 0.0 0.0 - 0.2 %   Neutrophils Relative % 84 %   Neutro Abs 11.1 (H) 1.7 - 7.7 K/uL   Lymphocytes Relative 5 %   Lymphs Abs 0.7 0.7 - 4.0 K/uL   Monocytes Relative 9 %   Monocytes Absolute 1.2 (H) 0.1 - 1.0 K/uL   Eosinophils Relative 1 %   Eosinophils Absolute 0.1 0.0 - 0.5 K/uL   Basophils Relative 0 %   Basophils Absolute 0.0 0.0 - 0.1 K/uL   Immature Granulocytes 1 %   Abs Immature Granulocytes 0.07 0.00 - 0.07 K/uL    Comment: Performed at [facility], [place] [address] [place], [place] [address]  Urinalysis, Routine w reflex microscopic Urine, Clean Catch     Status: Abnormal   Collection Time: 04/30/22  8:21 PM  Result Value Ref Range   Color, Urine YELLOW YELLOW   APPearance CLEAR CLEAR   Specific Gravity, Urine 1.020 1.005 - 1.030   pH 5.0 5.0 - 8.0   Glucose, UA NEGATIVE NEGATIVE mg/dL   Hgb urine dipstick MODERATE (A) NEGATIVE   Bilirubin Urine NEGATIVE NEGATIVE   Ketones, ur 5 (A) NEGATIVE mg/dL   Protein, ur 100 (A) NEGATIVE mg/dL   Nitrite POSITIVE (A) NEGATIVE   Leukocytes,Ua SMALL (A) NEGATIVE   RBC / HPF 0-5 0 - 5 RBC/hpf   WBC, UA 0-5 0 - 5 WBC/hpf   Bacteria, UA MANY (A) NONE SEEN   Squamous Epithelial / HPF 0-5 0 - 5 /HPF    Comment: Performed at [facility], [place] [name], [place], [place] [address]   CT Lumbar Spine Wo Contrast  Result Date: 04/30/2022 CLINICAL DATA:  pain, fall EXAM: CT LUMBAR SPINE WITHOUT CONTRAST TECHNIQUE: Multidetector CT imaging of the lumbar spine was performed without intravenous contrast administration. Multiplanar CT image reconstructions were  also generated. RADIATION DOSE REDUCTION: This exam was performed according to the departmental dose-optimization program which includes automated exposure control, adjustment of the mA and/or kV according to patient size and/or use of iterative reconstruction technique. COMPARISON:  CT renal protocol from February 18, 2019 FINDINGS: Segmentation: 5 non rib-bearing lumbar vertebral bodies. Alignment: Grade 2 anterolisthesis of L4 on L5 and grade 1 anterolisthesis of L3 on L4. Vertebrae: New linear sclerosis through the L2 vertebral body with cortical buckling anteriorly (for example see series 6, image 25). Osteopenia. Paraspinal and other soft tissues: Aorta bi-iliac calcific atherosclerosis. Disc levels: Severe facet arthropathy in lower lumbar spine with grade 2 anterolisthesis of L4 on L5 and grade 1 anterolisthesis of L3 on L4. Resulting probable canal and foraminal stenosis bilaterally at L4-L5. IMPRESSION: 1. New linear sclerosis through the L2 vertebral body with cortical buckling anteriorly, suspicious for acute fracture in the setting of known trauma. An MRI could assess for bone marrow edema if clinically warranted. 2. Severe facet arthropathy in lower lumbar spine with grade 2 anterolisthesis of L4 on L5 and grade 1 anterolisthesis of L3 on L4. Resulting probable canal and foraminal stenosis bilaterally at L4-L5. MRI could further characterize if clinically warranted. 3. Osteopenia. Electronically Signed   By: Margaretha Sheffield M.D.   On: 04/30/2022 18:25   CT PELVIS WO CONTRAST  Result Date: 04/30/2022 CLINICAL DATA:  Pain, fall. EXAM: CT PELVIS WITHOUT CONTRAST TECHNIQUE: Multidetector CT imaging of the pelvis was performed following the standard protocol without intravenous contrast. RADIATION DOSE REDUCTION: This exam was performed according to the departmental dose-optimization program which includes automated exposure control, adjustment of the mA and/or kV according to patient size and/or use  of iterative reconstruction technique. COMPARISON:  Pelvis CT 06/09/2020 FINDINGS: Urinary Tract:  No abnormality visualized. Bowel: There is sigmoid colon diverticulosis. No acute inflammation. No dilated bowel loops. Vascular/Lymphatic: Are atherosclerotic calcifications of the aorta and iliac arteries. No enlarged lymph nodes are seen. Reproductive:  Uterus is surgically absent. Other: There is no ascites. There are small fat containing inguinal hernias. Musculoskeletal: No focal soft tissue hematoma or fluid collection identified. The bones are diffusely osteopenic which limits evaluation for subtle nondisplaced fracture. No acute fracture or focal osseous lesion identified. No dislocation. There is grade 1 anterolisthesis at L4-L5. Please see dedicated lumbar spine CT for further description. There are moderate degenerative changes of the pubic symphysis and bilateral hips. There are mild degenerative changes of the sacroiliac joints. IMPRESSION: 1. Limited by osteopenia. No acute fracture or dislocation of the pelvis. 2. Sigmoid colon diverticulosis. Aortic Atherosclerosis (ICD10-I70.0). Electronically Signed   By: Ronney Asters M.D.   On: 04/30/2022 18:21   CT Chest Wo Contrast  Result Date: 04/30/2022 CLINICAL DATA:  Fall.  Pain.  Hypoxia. EXAM: CT CHEST WITHOUT CONTRAST TECHNIQUE: Multidetector CT imaging of the chest was performed following the standard protocol without IV contrast. RADIATION DOSE REDUCTION: This exam was performed according to the departmental dose-optimization program which includes automated exposure control, adjustment of the mA and/or kV according to patient size and/or use of iterative reconstruction technique. COMPARISON:  CT 01/04/2020 and older.  X-ray 06/09/2020 and older. FINDINGS: Cardiovascular: Normal caliber thoracic aorta with diffuse scattered calcified plaque on this non IV contrast exam. Coronary artery calcifications are seen. The heart is nonenlarged. Trace  pericardial fluid or thickening. There is some enlargement of the main pulmonary arteries. Please correlate for any history of pulmonary artery hypertension. Mediastinum/Nodes: Anasarca. No specific abnormal lymph node enlargement present in the axillary region on this noncontrast exam. No definite hilar nodes. There are some enlarged mediastinal nodes including precarinal on series 3, image 60 measuring 19 by 17 mm. On the prior exam this was smaller measuring 11 by 9 mm. Additional enlarging  nodes identified elsewhere paratracheal. Large hiatal hernia identified extending into the posterior left hemithorax. Slightly patulous esophagus above this. There is also focal diaphragmatic defect noted best seen on coronal image 42 of series 7 with herniation of some fat. Lungs/Pleura: Focal noncalcified nodule identified along the posterosuperior aspect of the middle lobe. On the prior this measured 14 mm in dimension and today 14 mm again on series 6, image 79. Please correlate prior workup. This has been stable for over 2 years. Areas of bronchiectasis with wall thickening and mild linear opacity along the right lower lobe is again noted. Slightly more prominent today. There is some similar areas in the posterior right upper lobe. Scattered bilateral areas of interstitial septal thickening and pleural thickening. Slightly more confluent opacity left lower lobe this could be compressive atelectasis related to the adjacent hiatal hernia. This is unchanged from previous. No pneumothorax. Upper Abdomen: Adrenal glands are preserved in the upper abdomen Musculoskeletal: Scattered degenerative changes of the spine with osteopenia. There is streak artifact related to the patient's left shoulder arthroplasty. Old left-sided rib fractures. Advanced degenerative changes of the right shoulder. IMPRESSION: Once again large hiatal hernia with a patulous esophagus. Separate focal diaphragmatic defect more left lateral. Adjacent  parenchymal opacity left lung base. Favor compressive atelectasis over infiltrate but correlate with symptoms. Stable 14 mm middle lobe noncalcified nodule which has been stable for least 2 years. Enlarging mediastinal lymph nodes not pathologic in the precarinal space. Please correlate for any known history otherwise a follow-up contrast study may be useful to further delineate when clinically appropriate Aortic Atherosclerosis (ICD10-I70.0) and Emphysema (ICD10-J43.9). Electronically Signed   By: Jill Side M.D.   On: 04/30/2022 18:15    Pending Labs Unresulted Labs (From admission, onward)     Start     Ordered   04/30/22 2210  Comprehensive metabolic panel  Add-on,   AD        04/30/22 2209   04/30/22 1709  CBC with Differential  Once,   STAT        04/30/22 1709   Signed and Held  Comprehensive metabolic panel  Tomorrow morning,   R        Signed and Held   Signed and Held  CBC with Differential/Platelet  Tomorrow morning,   R        Signed and Held            Vitals/Pain Today's Vitals   04/30/22 2130 04/30/22 2200 04/30/22 2230 04/30/22 2300  BP: (!) 188/89 (!) 190/100 (!) 165/107 (!) 167/88  Pulse: 64 74 68 65  Resp: (!) 31 (!) '25 16 16  '$ Temp:      TempSrc:      SpO2: 96% 97% 98% 97%  PainSc:        Isolation Precautions Airborne and Contact precautions  Medications Medications  cefTRIAXone (ROCEPHIN) 2 g in sodium chloride 0.9 % 100 mL IVPB (0 g Intravenous Stopped 04/30/22 2234)  azithromycin (ZITHROMAX) 500 mg in sodium chloride 0.9 % 250 mL IVPB (500 mg Intravenous New Bag/Given 04/30/22 2305)  fosfomycin (MONUROL) packet 3 g (0 g Oral Hold 04/30/22 2305)  traMADol (ULTRAM) tablet 50 mg (50 mg Oral Given 04/30/22 1905)  acetaminophen (TYLENOL) tablet 650 mg (650 mg Oral Given 04/30/22 1905)  furosemide (LASIX) injection 20 mg (20 mg Intravenous Given 04/30/22 2127)  amLODipine (NORVASC) tablet 5 mg (5 mg Oral Given 04/30/22 2127)    Mobility walks with person  assist

## 2022-05-01 ENCOUNTER — Observation Stay (HOSPITAL_COMMUNITY): Payer: Medicare Other

## 2022-05-01 DIAGNOSIS — R0902 Hypoxemia: Secondary | ICD-10-CM | POA: Diagnosis present

## 2022-05-01 DIAGNOSIS — L89156 Pressure-induced deep tissue damage of sacral region: Secondary | ICD-10-CM | POA: Diagnosis present

## 2022-05-01 DIAGNOSIS — E039 Hypothyroidism, unspecified: Secondary | ICD-10-CM | POA: Diagnosis present

## 2022-05-01 DIAGNOSIS — W19XXXA Unspecified fall, initial encounter: Secondary | ICD-10-CM | POA: Diagnosis not present

## 2022-05-01 DIAGNOSIS — S32029A Unspecified fracture of second lumbar vertebra, initial encounter for closed fracture: Secondary | ICD-10-CM | POA: Diagnosis present

## 2022-05-01 DIAGNOSIS — E782 Mixed hyperlipidemia: Secondary | ICD-10-CM | POA: Diagnosis present

## 2022-05-01 DIAGNOSIS — Z888 Allergy status to other drugs, medicaments and biological substances status: Secondary | ICD-10-CM | POA: Diagnosis not present

## 2022-05-01 DIAGNOSIS — Y92239 Unspecified place in hospital as the place of occurrence of the external cause: Secondary | ICD-10-CM | POA: Diagnosis not present

## 2022-05-01 DIAGNOSIS — F32A Depression, unspecified: Secondary | ICD-10-CM | POA: Diagnosis present

## 2022-05-01 DIAGNOSIS — Z91013 Allergy to seafood: Secondary | ICD-10-CM | POA: Diagnosis not present

## 2022-05-01 DIAGNOSIS — J9601 Acute respiratory failure with hypoxia: Secondary | ICD-10-CM | POA: Diagnosis present

## 2022-05-01 DIAGNOSIS — Z66 Do not resuscitate: Secondary | ICD-10-CM | POA: Diagnosis present

## 2022-05-01 DIAGNOSIS — I951 Orthostatic hypotension: Secondary | ICD-10-CM | POA: Diagnosis present

## 2022-05-01 DIAGNOSIS — Z85820 Personal history of malignant melanoma of skin: Secondary | ICD-10-CM | POA: Diagnosis not present

## 2022-05-01 DIAGNOSIS — Z7901 Long term (current) use of anticoagulants: Secondary | ICD-10-CM | POA: Diagnosis not present

## 2022-05-01 DIAGNOSIS — Z9181 History of falling: Secondary | ICD-10-CM | POA: Diagnosis not present

## 2022-05-01 DIAGNOSIS — I447 Left bundle-branch block, unspecified: Secondary | ICD-10-CM | POA: Diagnosis present

## 2022-05-01 DIAGNOSIS — Z96612 Presence of left artificial shoulder joint: Secondary | ICD-10-CM | POA: Diagnosis present

## 2022-05-01 DIAGNOSIS — Z1152 Encounter for screening for COVID-19: Secondary | ICD-10-CM | POA: Diagnosis not present

## 2022-05-01 DIAGNOSIS — W449XXA Unspecified foreign body entering into or through a natural orifice, initial encounter: Secondary | ICD-10-CM | POA: Diagnosis not present

## 2022-05-01 DIAGNOSIS — Y92009 Unspecified place in unspecified non-institutional (private) residence as the place of occurrence of the external cause: Secondary | ICD-10-CM | POA: Diagnosis not present

## 2022-05-01 DIAGNOSIS — I252 Old myocardial infarction: Secondary | ICD-10-CM | POA: Diagnosis not present

## 2022-05-01 DIAGNOSIS — T17908A Unspecified foreign body in respiratory tract, part unspecified causing other injury, initial encounter: Secondary | ICD-10-CM | POA: Diagnosis not present

## 2022-05-01 DIAGNOSIS — Z882 Allergy status to sulfonamides status: Secondary | ICD-10-CM | POA: Diagnosis not present

## 2022-05-01 DIAGNOSIS — J9811 Atelectasis: Secondary | ICD-10-CM | POA: Diagnosis present

## 2022-05-01 DIAGNOSIS — I2699 Other pulmonary embolism without acute cor pulmonale: Secondary | ICD-10-CM | POA: Diagnosis present

## 2022-05-01 DIAGNOSIS — N1831 Chronic kidney disease, stage 3a: Secondary | ICD-10-CM | POA: Diagnosis not present

## 2022-05-01 DIAGNOSIS — S32020A Wedge compression fracture of second lumbar vertebra, initial encounter for closed fracture: Secondary | ICD-10-CM | POA: Diagnosis not present

## 2022-05-01 DIAGNOSIS — K219 Gastro-esophageal reflux disease without esophagitis: Secondary | ICD-10-CM

## 2022-05-01 DIAGNOSIS — I129 Hypertensive chronic kidney disease with stage 1 through stage 4 chronic kidney disease, or unspecified chronic kidney disease: Secondary | ICD-10-CM | POA: Diagnosis present

## 2022-05-01 DIAGNOSIS — Y9301 Activity, walking, marching and hiking: Secondary | ICD-10-CM | POA: Diagnosis present

## 2022-05-01 DIAGNOSIS — W010XXA Fall on same level from slipping, tripping and stumbling without subsequent striking against object, initial encounter: Secondary | ICD-10-CM | POA: Diagnosis present

## 2022-05-01 LAB — COMPREHENSIVE METABOLIC PANEL
ALT: 10 U/L (ref 0–44)
ALT: 11 U/L (ref 0–44)
AST: 22 U/L (ref 15–41)
AST: 24 U/L (ref 15–41)
Albumin: 3.5 g/dL (ref 3.5–5.0)
Albumin: 3.5 g/dL (ref 3.5–5.0)
Alkaline Phosphatase: 42 U/L (ref 38–126)
Alkaline Phosphatase: 44 U/L (ref 38–126)
Anion gap: 6 (ref 5–15)
Anion gap: 12 (ref 5–15)
BUN: 29 mg/dL — ABNORMAL HIGH (ref 8–23)
BUN: 32 mg/dL — ABNORMAL HIGH (ref 8–23)
CO2: 20 mmol/L — ABNORMAL LOW (ref 22–32)
CO2: 17 mmol/L — ABNORMAL LOW (ref 22–32)
Calcium: 8.5 mg/dL — ABNORMAL LOW (ref 8.9–10.3)
Calcium: 8.6 mg/dL — ABNORMAL LOW (ref 8.9–10.3)
Chloride: 111 mmol/L (ref 98–111)
Chloride: 108 mmol/L (ref 98–111)
Creatinine, Ser: 1.25 mg/dL — ABNORMAL HIGH (ref 0.44–1.00)
Creatinine, Ser: 1.35 mg/dL — ABNORMAL HIGH (ref 0.44–1.00)
GFR, Estimated: 39 mL/min — ABNORMAL LOW (ref >60)
GFR, Estimated: 36 mL/min — ABNORMAL LOW (ref >60)
Glucose, Bld: 113 mg/dL — ABNORMAL HIGH (ref 70–99)
Glucose, Bld: 99 mg/dL (ref 70–99)
Potassium: 3.8 mmol/L (ref 3.5–5.1)
Potassium: 4 mmol/L (ref 3.5–5.1)
Sodium: 137 mmol/L (ref 135–145)
Sodium: 137 mmol/L (ref 135–145)
Total Bilirubin: 0.5 mg/dL (ref 0.3–1.2)
Total Bilirubin: 0.8 mg/dL (ref 0.3–1.2)
Total Protein: 6.7 g/dL (ref 6.5–8.1)
Total Protein: 7.1 g/dL (ref 6.5–8.1)

## 2022-05-01 LAB — CBC WITH DIFFERENTIAL/PLATELET
Abs Immature Granulocytes: 0.06 10*3/uL (ref 0.00–0.07)
Basophils Absolute: 0 10*3/uL (ref 0.0–0.1)
Basophils Relative: 0 %
Eosinophils Absolute: 0.2 10*3/uL (ref 0.0–0.5)
Eosinophils Relative: 2 %
HCT: 37.1 % (ref 36.0–46.0)
Hemoglobin: 12.2 g/dL (ref 12.0–15.0)
Immature Granulocytes: 1 %
Lymphocytes Relative: 8 %
Lymphs Abs: 0.8 10*3/uL (ref 0.7–4.0)
MCH: 32.2 pg (ref 26.0–34.0)
MCHC: 32.9 g/dL (ref 30.0–36.0)
MCV: 97.9 fL (ref 80.0–100.0)
Monocytes Absolute: 1 10*3/uL (ref 0.1–1.0)
Monocytes Relative: 10 %
Neutro Abs: 8.5 10*3/uL — ABNORMAL HIGH (ref 1.7–7.7)
Neutrophils Relative %: 79 %
Platelets: 208 10*3/uL (ref 150–400)
RBC: 3.79 MIL/uL — ABNORMAL LOW (ref 3.87–5.11)
RDW: 14.2 % (ref 11.5–15.5)
WBC: 10.7 10*3/uL — ABNORMAL HIGH (ref 4.0–10.5)
nRBC: 0 % (ref 0.0–0.2)

## 2022-05-01 LAB — GLUCOSE, CAPILLARY
Glucose-Capillary: 101 mg/dL — ABNORMAL HIGH (ref 70–99)
Glucose-Capillary: 113 mg/dL — ABNORMAL HIGH (ref 70–99)
Glucose-Capillary: 98 mg/dL (ref 70–99)

## 2022-05-01 LAB — C-REACTIVE PROTEIN: CRP: 5.1 mg/dL — ABNORMAL HIGH (ref <1.0)

## 2022-05-01 LAB — D-DIMER, QUANTITATIVE: D-Dimer, Quant: 10.38 ug/mL-FEU — ABNORMAL HIGH (ref 0.00–0.50)

## 2022-05-01 LAB — PROCALCITONIN: Procalcitonin: <0.1 ng/mL

## 2022-05-01 MED ORDER — ACETAMINOPHEN 650 MG RE SUPP: 650.0000 mg | Freq: Four times a day (QID) | RECTAL | Status: DC | PRN

## 2022-05-01 MED ORDER — HYDRALAZINE HCL 20 MG/ML IJ SOLN: 10.0000 mg | Freq: Four times a day (QID) | INTRAMUSCULAR | Status: DC | PRN

## 2022-05-01 MED ORDER — PANTOPRAZOLE SODIUM 40 MG PO TBEC
40.0000 mg | DELAYED_RELEASE_TABLET | Freq: Every day | ORAL | Status: DC
  Administered 2022-05-02 – 2022-05-06 (×5): 40 mg via ORAL
  Filled 2022-05-01 (×5): qty 1

## 2022-05-01 MED ORDER — MELATONIN 5 MG PO TABS: 10.0000 mg | ORAL_TABLET | Freq: Every evening | ORAL | Status: DC | PRN

## 2022-05-01 MED ORDER — SIMVASTATIN 20 MG PO TABS
20.0000 mg | ORAL_TABLET | Freq: Every evening | ORAL | Status: DC
  Administered 2022-05-01 – 2022-05-05 (×5): 20 mg via ORAL
  Filled 2022-05-01 (×5): qty 1

## 2022-05-01 MED ORDER — ACETAMINOPHEN 325 MG PO TABS
650.0000 mg | ORAL_TABLET | Freq: Four times a day (QID) | ORAL | Status: DC | PRN
  Filled 2022-05-01: qty 2

## 2022-05-01 MED ORDER — LEVOTHYROXINE SODIUM 88 MCG PO TABS
88.0000 ug | ORAL_TABLET | Freq: Every day | ORAL | Status: DC
  Administered 2022-05-02 – 2022-05-06 (×5): 88 ug via ORAL
  Filled 2022-05-01 (×5): qty 1

## 2022-05-01 MED ORDER — ONDANSETRON HCL 4 MG/2ML IJ SOLN: 4.0000 mg | Freq: Four times a day (QID) | INTRAMUSCULAR | Status: DC | PRN

## 2022-05-01 MED ORDER — ONDANSETRON HCL 4 MG PO TABS: 4.0000 mg | ORAL_TABLET | Freq: Four times a day (QID) | ORAL | Status: DC | PRN

## 2022-05-01 MED ORDER — HYDRALAZINE HCL 20 MG/ML IJ SOLN
5.0000 mg | Freq: Four times a day (QID) | INTRAMUSCULAR | Status: DC | PRN
  Administered 2022-05-01 – 2022-05-05 (×3): 5 mg via INTRAVENOUS
  Filled 2022-05-01 (×3): qty 1

## 2022-05-01 NOTE — Plan of Care (Signed)

## 2022-05-01 NOTE — Progress Notes (Signed)
PROGRESS NOTE   Cindy [name]  OBS:[id number] DOB: Apr 15, 1922 DOA: 04/30/2022 PCP: Michael Boston, MD   Date of Service: the patient was seen and examined on 05/02/2022  Brief Narrative:  89 year old female with past medical history of chronic kidney disease stage IIIa, hypertension, orthostatic hypotension, prior pulmonary embolism on Eliquis presenting [facility] via EMS department status post fall at home.  Upon evaluation in the emergency department patient was found to be in acute hypoxic respiratory failure.  Patient was also found to have an acute L2 fracture on trauma CT survey.  Due to new oxygen requirement the hospitalist group was called to assess the patient for admission to the hospital.  Upon initial workup, patient was not found to have any intrapulmonary pathology.  Patient's lumbar fracture was worked up further with MRI of the lumbar spine.  Supplemental oxygen was provided.   Assessment and Plan: * Acute respiratory failure with hypoxia (HCC) Persisting oxygen requirement  Obtaining procalcitonin, D-dimer  If procalcitonin elevated we will place patient on antibiotic therapy  If D-dimer is elevated, will proceed with VQ scan of the chest to evaluate for pulmonary embolism in the setting of suboptimal outpatient Eliquis use (once daily) Continuing supplemental oxygen to maintain oxygen saturations above 92%. As needed bronchodilator therapy for shortness of breath and wheezing.  Fall at home, initial encounter Witnessed fall at home in the absence of loss of consciousness  Notable L2 fracture based on trauma survey  Obtain PT evaluation to assess strength and balance with ambulation. Obtaining vitamin D level   Closed compression fracture of L2 vertebra (HCC) Please see assessment and plan above  Chronic kidney disease, stage 3a (HCC) Strict intake and output monitoring Creatinine near baseline Minimizing nephrotoxic agents as much as  possible Serial chemistries to monitor renal function and electrolytes   GERD without esophagitis Continuing home regimen of daily PPI therapy.   Mixed hyperlipidemia Continuing home regimen of lipid lowering therapy.   DNR (do not resuscitate)/DNI(Do Not Intubate) Verified on admission  Essential hypertension Conservative management with conservative targets due to known history of severe orthostatic hypotension  As needed intravenous antihypertensives for markedly elevated blood pressure.    History of pulmonary embolism Patient has been taking a potentially subtherapeutic regimen of Eliquis once daily  This places patient at risk of recurrent pulmonary embolism  D-dimer is being obtained and if markedly elevated we will assess via VQ scan.    Subjective:  Patient denies back pain or significant shortness of breath.  Physical Exam:  Vitals:   05/01/22 0752 05/01/22 1202 05/01/22 1235 05/01/22 2213  BP: (!) 160/79 (!) 179/87 (!) 182/75 (!) 156/82  Pulse: (!) 55 (!) 57 (!) 54 64  Resp: '17 17 16 20  '$ Temp: 97.9 F (36.6 C) 98 F (36.7 C) (!) 97.5 F (36.4 C) 98.5 F (36.9 C)  TempSrc: Oral     SpO2: 99% 94% 100% 99%  Weight:      Height:        Constitutional: Awake alert and oriented x3, no associated distress.   Skin: no rashes, no lesions, poor skin turgor noted. Eyes: Pupils are equally reactive to light.  No evidence of scleral icterus or conjunctival pallor.  ENMT: Moist mucous membranes noted.  Posterior pharynx clear of any exudate or lesions.   Respiratory: Bibaslar rales without wheezing. Normal respiratory effort. No accessory muscle use.  Cardiovascular: Regular rate and rhythm, no murmurs / rubs / gallops. No extremity edema. 2+  pedal pulses. No carotid bruits.  Abdomen: Abdomen is soft and nontender.  No evidence of intra-abdominal masses.  Positive bowel sounds noted in all quadrants.   Musculoskeletal: No joint deformity upper and lower  extremities. Good ROM, no contractures. poor muscle tone.    Data Reviewed:  I have personally reviewed and interpreted labs, imaging.  Significant findings are   CBC: Recent Labs  Lab 04/30/22 2009 05/01/22 0058  WBC 13.1* 10.7*  NEUTROABS 11.1* 8.5*  HGB 12.9 12.2  HCT 39.8 37.1  MCV 97.3 97.9  PLT 230 765   Basic Metabolic Panel: Recent Labs  Lab 05/01/22 0058 05/01/22 2131  NA 137 137  K 3.8 4.0  CL 111 108  CO2 20* 17*  GLUCOSE 113* 99  BUN 29* 32*  CREATININE 1.25* 1.35*  CALCIUM 8.5* 8.6*   GFR: Estimated Creatinine Clearance: 19.3 mL/min (A) (by C-G formula based on SCr of 1.35 mg/dL (H)). Liver Function Tests: Recent Labs  Lab 05/01/22 0058 05/01/22 2131  AST 22 24  ALT 10 11  ALKPHOS 42 44  BILITOT 0.5 0.8  PROT 6.7 7.1  ALBUMIN 3.5 3.5    Coagulation Profile: Recent Labs  Lab 04/30/22 1931  INR 1.0      Code Status:  DNR.  Family Communication: Plan of care has been discussed at length with son POA as well as caregiver via phone conversation(1/20).    Severity of Illness:  The appropriate patient status for this patient is INPATIENT. Inpatient status is judged to be reasonable and necessary in order to provide the required intensity of service to ensure the patient's safety. The patient's presenting symptoms, physical exam findings, and initial radiographic and laboratory data in the context of their chronic comorbidities is felt to place them at high risk for further clinical deterioration. Furthermore, it is not anticipated that the patient will be medically stable for discharge from the hospital within 2 midnights of admission.   * I certify that at the point of admission it is my clinical judgment that the patient will require inpatient hospital care spanning beyond 2 midnights from the point of admission due to high intensity of service, high risk for further deterioration and high frequency of surveillance required.*  Time spent:   55 minutes  Author:  Vernelle Emerald MD  05/02/2022 12:11 AM

## 2022-05-01 NOTE — Hospital Course (Addendum)
26yof w/ CKD IIIa, hypertension, orthostatic hypotension, prior pulmonary embolism on Eliquis presented to ED status post fall at home. IN NH:RVACQ to be in acute hypoxic respiratory failur,acute L2 fracture on trauma CT survey.   Due to new oxygen requirement the hospitalist group was called to assess the patient for admission to the hospital. Despite serial chest imaging patient not found to have any evidence of pneumonia.  Patient was however found to have a markedly elevated D-dimer concerning for pulmonary embolism.  Patient was placed on an empiric regimen of intravenous heparin and after a discussion about diagnostic options with family VQ scan was ordered.  Concerning patient's identified L2 fracture likely suffered during the patient's witness fall this was worked up further with MRI of the lumbar spine.In the absence of significant pain kyphoplasty or neurosurgery consultation was felt not to be necessary and conservative management was pursued. TSLO brace ordered along w/ PTOT.

## 2022-05-02 ENCOUNTER — Inpatient Hospital Stay (HOSPITAL_COMMUNITY): Payer: Medicare Other

## 2022-05-02 DIAGNOSIS — W19XXXA Unspecified fall, initial encounter: Secondary | ICD-10-CM | POA: Diagnosis not present

## 2022-05-02 DIAGNOSIS — S32020A Wedge compression fracture of second lumbar vertebra, initial encounter for closed fracture: Secondary | ICD-10-CM | POA: Diagnosis not present

## 2022-05-02 DIAGNOSIS — N1831 Chronic kidney disease, stage 3a: Secondary | ICD-10-CM | POA: Diagnosis not present

## 2022-05-02 DIAGNOSIS — J9601 Acute respiratory failure with hypoxia: Secondary | ICD-10-CM | POA: Diagnosis not present

## 2022-05-02 LAB — COMPREHENSIVE METABOLIC PANEL
ALT: 12 U/L (ref 0–44)
AST: 18 U/L (ref 15–41)
Albumin: 3.7 g/dL (ref 3.5–5.0)
Alkaline Phosphatase: 43 U/L (ref 38–126)
Anion gap: 9 (ref 5–15)
BUN: 33 mg/dL — ABNORMAL HIGH (ref 8–23)
CO2: 22 mmol/L (ref 22–32)
Calcium: 9 mg/dL (ref 8.9–10.3)
Chloride: 108 mmol/L (ref 98–111)
Creatinine, Ser: 1.32 mg/dL — ABNORMAL HIGH (ref 0.44–1.00)
GFR, Estimated: 37 mL/min — ABNORMAL LOW (ref >60)
Glucose, Bld: 98 mg/dL (ref 70–99)
Potassium: 3.7 mmol/L (ref 3.5–5.1)
Sodium: 139 mmol/L (ref 135–145)
Total Bilirubin: 0.8 mg/dL (ref 0.3–1.2)
Total Protein: 7.2 g/dL (ref 6.5–8.1)

## 2022-05-02 LAB — CBC WITH DIFFERENTIAL/PLATELET
Abs Immature Granulocytes: 0.04 10*3/uL (ref 0.00–0.07)
Basophils Absolute: 0 10*3/uL (ref 0.0–0.1)
Basophils Relative: 0 %
Eosinophils Absolute: 0.6 10*3/uL — ABNORMAL HIGH (ref 0.0–0.5)
Eosinophils Relative: 6 %
HCT: 37 % (ref 36.0–46.0)
Hemoglobin: 12.2 g/dL (ref 12.0–15.0)
Immature Granulocytes: 0 %
Lymphocytes Relative: 6 %
Lymphs Abs: 0.6 10*3/uL — ABNORMAL LOW (ref 0.7–4.0)
MCH: 32.4 pg (ref 26.0–34.0)
MCHC: 33 g/dL (ref 30.0–36.0)
MCV: 98.4 fL (ref 80.0–100.0)
Monocytes Absolute: 1 10*3/uL (ref 0.1–1.0)
Monocytes Relative: 10 %
Neutro Abs: 7.4 10*3/uL (ref 1.7–7.7)
Neutrophils Relative %: 78 %
Platelets: 205 10*3/uL (ref 150–400)
RBC: 3.76 MIL/uL — ABNORMAL LOW (ref 3.87–5.11)
RDW: 14.3 % (ref 11.5–15.5)
WBC: 9.7 10*3/uL (ref 4.0–10.5)
nRBC: 0 % (ref 0.0–0.2)

## 2022-05-02 LAB — GLUCOSE, CAPILLARY: Glucose-Capillary: 98 mg/dL (ref 70–99)

## 2022-05-02 LAB — APTT: aPTT: 27 seconds (ref 24–36)

## 2022-05-02 LAB — MAGNESIUM: Magnesium: 2 mg/dL (ref 1.7–2.4)

## 2022-05-02 LAB — HEPARIN LEVEL (UNFRACTIONATED)
Heparin Unfractionated: <0.1 IU/mL — ABNORMAL LOW (ref 0.30–0.70)
Heparin Unfractionated: >1.1 IU/mL — ABNORMAL HIGH (ref 0.30–0.70)
Heparin Unfractionated: 0.44 IU/mL (ref 0.30–0.70)

## 2022-05-02 MED ORDER — HEPARIN (PORCINE) 25000 UT/250ML-% IV SOLN
750.0000 [IU]/h | INTRAVENOUS | Status: DC
  Administered 2022-05-02: 750 [IU]/h via INTRAVENOUS
  Filled 2022-05-02: qty 250

## 2022-05-02 MED ORDER — ALBUTEROL SULFATE (2.5 MG/3ML) 0.083% IN NEBU: 2.5000 mg | INHALATION_SOLUTION | RESPIRATORY_TRACT | Status: DC | PRN

## 2022-05-02 MED ORDER — HEPARIN BOLUS VIA INFUSION
1500.0000 [IU] | Freq: Once | INTRAVENOUS | Status: AC
  Administered 2022-05-02: 1500 [IU] via INTRAVENOUS
  Filled 2022-05-02: qty 1500

## 2022-05-02 MED ORDER — HEPARIN (PORCINE) 25000 UT/250ML-% IV SOLN
800.0000 [IU]/h | INTRAVENOUS | Status: DC
  Administered 2022-05-02 – 2022-05-03 (×2): 600 [IU]/h via INTRAVENOUS
  Administered 2022-05-04: 700 [IU]/h via INTRAVENOUS
  Filled 2022-05-02 (×3): qty 250

## 2022-05-02 NOTE — Progress Notes (Signed)
ANTICOAGULATION CONSULT NOTE -   Consult  Pharmacy Consult for Heparin (on apixaban PTA) Indication: r/o PE   Allergies  Allergen Reactions   Fish Allergy Hives, Nausea And Vomiting, Swelling and Other (See Comments)    Redness    Diclofenac Other (See Comments)    Not allergic per caregiver    Ibuprofen Other (See Comments)    Affects kidney function   Sulfasalazine Other (See Comments)    Unknown    Sulfa Antibiotics     Unknown     Patient Measurements: Height: 5' 2.99" (160 cm) Weight: 52.9 kg (116 lb 10 oz) IBW/kg (Calculated) : 52.38 Heparin Dosing Weight: actual body weight  Vital Signs: Temp: 98 F (36.7 C) (01/21 2021) Temp Source: Axillary (01/21 2021) BP: 186/97 (01/21 2021) Pulse Rate: 72 (01/21 2021)  Labs: Recent Labs    04/30/22 1931 04/30/22 2009 04/30/22 2009 05/01/22 0058 05/01/22 2131 05/02/22 0102 05/02/22 1014 05/02/22 2224  HGB  --  12.9   < > 12.2  --  12.2  --   --   HCT  --  39.8  --  37.1  --  37.0  --   --   PLT  --  230  --  208  --  205  --   --   APTT  --   --   --   --   --  27  --   --   LABPROT 13.1  --   --   --   --   --   --   --   INR 1.0  --   --   --   --   --   --   --   HEPARINUNFRC  --   --   --   --   --  <0.10* >1.10* 0.44  CREATININE  --   --   --  1.25* 1.35* 1.32*  --   --    < > = values in this interval not displayed.     Estimated Creatinine Clearance: 20.2 mL/min (A) (by C-G formula based on SCr of 1.32 mg/dL (H)).   Medical History: Past Medical History:  Diagnosis Date   Arthritis    Chronic renal insufficiency, stage III (moderate) (HCC)    Depression    Dyslipidemia    Forgetfulness    GERD (gastroesophageal reflux disease)    takes Omeprazole for Hital Hernia    Hiatal hernia    History of nuclear stress test 2005   persantine; inferoapical ischmeia w/ EF 83%; no CAD at cath   [facility] (hard of hearing)    hearing aids   Hypertension    Hypothyroidism    LBBB (left bundle branch block)     chronic per Dr. Gwenlyn Found   Lung nodule 07/01/2003   right middle lobe   Macular degeneration    Malignant melanoma ([place]) 11/12/2004   L upper arm, MOHS_ right hand, leg   Murmur, cardiac    NSTEMI (non-ST elevated myocardial infarction) ([place]) 03/05/2019   Non-STEMI   Osteoporosis    Pneumonia 06/2018   Pulmonary embolism ([place]) 11/16/2019   Pulmonary embolism    Medications:  PTA apixaban 2.'5mg'$  once daily - LD taken 1/19 between 11am-noon  Assessment: 89 yr female admitted 1/19 pm s/p fall with acute respiratory failure with hypoxis.  PMH significant for PE in 2021 and is on Apixaban (pt currently taking apixaban 2.'5mg'$  once daily). D-Dimer elevated 10.38 Baseline INR = 1.0 Hgb/Platelet  count wnl  05/02/22 Heparin level = 0.44 (therapeutic) with heparin gtt decreased to rate of 600 units/hr No complications of therapy noted   Goal of Therapy:  Heparin level 0.3-0.7 Monitor platelets by anticoagulation protocol: Yes   Plan:  Continue heparin gtt at 600 units/hr Confirm therapeutic HL with AM labs Monitor daily HL and CBC F/u results of VQ scan Monitor for signs and symptoms of bleeding   Leone Haven, PharmD 05/02/2022 11:19 PM

## 2022-05-02 NOTE — Progress Notes (Signed)
PROGRESS NOTE   Cindy [name]  ZOX:[id number] DOB: 13-Aug-1922 DOA: 04/30/2022 PCP: Michael Boston, MD   Date of Service: the patient was seen and examined on 05/02/2022  Brief Narrative:  89 year old female with past medical history of chronic kidney disease stage IIIa, hypertension, orthostatic hypotension, prior pulmonary embolism on Eliquis presenting to [facility] long hospital emergency via EMS department status post fall at home.  Upon evaluation in the emergency department patient was found to be in acute hypoxic respiratory failure.  Patient was also found to have an acute L2 fracture on trauma CT survey.  Due to new oxygen requirement the hospitalist group was called to assess the patient for admission to the hospital.  Despite serial chest imaging patient not found to have any evidence of pneumonia.  Patient was however found to have a markedly elevated D-dimer concerning for pulmonary embolism.  Patient was placed on an empiric regimen of intravenous heparin and after a discussion about diagnostic options with family VQ scan was ordered.   Concerning patient's identified L2 fracture likely suffered during the patient's witness fall this was worked up further with MRI of the lumbar spine.  In the absence of significant pain kyphoplasty or neurosurgery consultation was felt not to be necessary and conservative management was pursued..   Assessment and Plan: * Acute respiratory failure with hypoxia (HCC) Persisting oxygen requirement  No evidence of pneumonia based on review of serial chest imaging and normal procalcitonin That being said, D-dimer has been found to be markedly elevated in this patient on a subtherapeutic regimen of 2.5 Eliquis daily and history of pulmonary embolism in the past Markedly elevated D-dimer discussed with son on 1/20.  He opted to proceed with VQ scan instead of contrast CT imaging due to borderline renal function Patient currently on empiric regimen of  heparin Awaiting VQ scan which should be performed on 1/22 Will transition to alternative anticoagulation if VQ scan confirms pulmonary embolism As needed bronchodilator therapy in the meantime Continued supplemental oxygen to maintain oxygen saturations above 92%.  Fall at home, initial encounter Witnessed fall by patient's caregiver at home in the absence of loss of consciousness  Notable L2 fracture based on trauma survey However patient is denying significant back pain Obtain PT evaluation to assess strength and balance with ambulation.   Closed compression fracture of L2 vertebra (HCC) Notable L2 fracture on both CT and MRI imaging Patient denies significant back pain however Vitamin D level pending Since patient is not experiencing significant debility or limited ambulation due to the pain I do not believe she needs a kyphoplasty and conservative management is more appropriate.  Chronic kidney disease, stage 3a (HCC) Strict intake and output monitoring Creatinine near baseline Minimizing nephrotoxic agents as much as possible Serial chemistries to monitor renal function and electrolytes   GERD without esophagitis Continuing home regimen of daily PPI therapy.   Mixed hyperlipidemia Continuing home regimen of lipid lowering therapy.   DNR (do not resuscitate)/DNI(Do Not Intubate) Verified on admission  Essential hypertension Conservative management with conservative targets due to known history of severe orthostatic hypotension  As needed intravenous antihypertensives for markedly elevated blood pressure.    History of pulmonary embolism Patient has been taking a potentially subtherapeutic regimen of Eliquis once daily  This places patient at risk of recurrent pulmonary embolism  D-dimer found to be markedly elevated, patient is on heparin infusion and awaiting VQ scan which should be performed morning of 1/22.    Subjective:  Patient denies  back pain significant  shortness of breath or cough.  Physical Exam:  Vitals:   05/02/22 0643 05/02/22 1008 05/02/22 1328 05/02/22 2021  BP: (!) 180/79 (!) 155/78 (!) 171/80 (!) 186/97  Pulse: 66 62 67 72  Resp: '20 18 16 16  '$ Temp: 98.5 F (36.9 C) 98.5 F (36.9 C) 97.7 F (36.5 C) 98 F (36.7 C)  TempSrc:  Oral  Axillary  SpO2: 99% 100% 99% 97%  Weight:      Height:        Constitutional: Awake alert and oriented x3, no associated distress.   Skin: no rashes, no lesions, good skin turgor noted. Eyes: Pupils are equally reactive to light.  No evidence of scleral icterus or conjunctival pallor.  ENMT: Moist mucous membranes noted.  Posterior pharynx clear of any exudate or lesions.   Respiratory: Scattered rhonchi bilaterally, no wheezing, no crackles. Normal respiratory effort. No accessory muscle use.  Cardiovascular: Regular rate and rhythm, no murmurs / rubs / gallops. No extremity edema. 2+ pedal pulses. No carotid bruits.  Abdomen: Abdomen is soft and nontender.  No evidence of intra-abdominal masses.  Positive bowel sounds noted in all quadrants.   Musculoskeletal: No joint deformity upper and lower extremities. Good ROM, no contractures.  Poor muscle tone.  Data Reviewed:  I have personally reviewed and interpreted labs, imaging.  Significant findings are   CBC: Recent Labs  Lab 04/30/22 2009 05/01/22 0058 05/02/22 0102  WBC 13.1* 10.7* 9.7  NEUTROABS 11.1* 8.5* 7.4  HGB 12.9 12.2 12.2  HCT 39.8 37.1 37.0  MCV 97.3 97.9 98.4  PLT 230 208 250   Basic Metabolic Panel: Recent Labs  Lab 05/01/22 0058 05/01/22 2131 05/02/22 0102  NA 137 137 139  K 3.8 4.0 3.7  CL 111 108 108  CO2 20* 17* 22  GLUCOSE 113* 99 98  BUN 29* 32* 33*  CREATININE 1.25* 1.35* 1.32*  CALCIUM 8.5* 8.6* 9.0  MG  --   --  2.0   GFR: Estimated Creatinine Clearance: 20.2 mL/min (A) (by C-G formula based on SCr of 1.32 mg/dL (H)). Liver Function Tests: Recent Labs  Lab 05/01/22 0058 05/01/22 2131  05/02/22 0102  AST '22 24 18  '$ ALT '10 11 12  '$ ALKPHOS 42 44 43  BILITOT 0.5 0.8 0.8  PROT 6.7 7.1 7.2  ALBUMIN 3.5 3.5 3.7    Coagulation Profile: Recent Labs  Lab 04/30/22 1931  INR 1.0      Code Status:  DNR.  Code status decision has been confirmed with: patient Family Communication: Son has been updated on plan of care via phone conversation 1/20.   Severity of Illness:  The appropriate patient status for this patient is INPATIENT. Inpatient status is judged to be reasonable and necessary in order to provide the required intensity of service to ensure the patient's safety. The patient's presenting symptoms, physical exam findings, and initial radiographic and laboratory data in the context of their chronic comorbidities is felt to place them at high risk for further clinical deterioration. Furthermore, it is not anticipated that the patient will be medically stable for discharge from the hospital within 2 midnights of admission.   * I certify that at the point of admission it is my clinical judgment that the patient will require inpatient hospital care spanning beyond 2 midnights from the point of admission due to high intensity of service, high risk for further deterioration and high frequency of surveillance required.*  Time spent:  40 minutes  Author:  Vernelle Emerald MD  05/02/2022 9:20 PM

## 2022-05-02 NOTE — TOC Progression Note (Signed)
Transition of Care [facility]) - Progression Note    Patient Details  Name: Cindy [name] MRN: [id number] Date of Birth: 12-30-1922  [facility] of [facility] CM/SW Contact  Servando Snare, [place] Phone Number: 05/02/2022, 10:25 AM  Clinical Narrative:   Chart reviewed. No formal consult. Patient is from home with caregiver. Son is POA. TOC will follow for any dc needs.          Expected Discharge Plan and Services                                               Social Determinants of Health (SDOH) Interventions SDOH Screenings   Food Insecurity: No Food Insecurity (05/01/2022)  Housing: Low Risk  (05/01/2022)  Transportation Needs: No Transportation Needs (05/01/2022)  Utilities: Not At Risk (05/01/2022)  Tobacco Use: Low Risk  (04/30/2022)    Readmission Risk Interventions     No data to display

## 2022-05-02 NOTE — Progress Notes (Signed)
PT Cancellation Note  Patient Details Name: Cindy [name] MRN: [id number] DOB: January 02, 1923   Cancelled Treatment:     PT order received but eval deferred - Per RN pt with supra-therapeutic Heparin level and awaiting VQ scan to R/O PE.  Will follow   Cindy [name] 05/02/2022, 12:45 PM

## 2022-05-02 NOTE — Assessment & Plan Note (Signed)
• 

## 2022-05-02 NOTE — Progress Notes (Signed)
ANTICOAGULATION CONSULT NOTE -   Consult  Pharmacy Consult for Heparin (on apixaban PTA) Indication: r/o PE   Allergies  Allergen Reactions   Fish Allergy Hives, Nausea And Vomiting, Swelling and Other (See Comments)    Redness    Diclofenac Other (See Comments)    Not allergic per caregiver    Ibuprofen Other (See Comments)    Affects kidney function   Sulfasalazine Other (See Comments)    Unknown    Sulfa Antibiotics     Unknown     Patient Measurements: Height: 5' 2.99" (160 cm) Weight: 52.9 kg (116 lb 10 oz) IBW/kg (Calculated) : 52.38 Heparin Dosing Weight: actual body weight  Vital Signs: Temp: 97.7 F (36.5 C) (01/21 1328) Temp Source: Oral (01/21 1008) BP: 171/80 (01/21 1328) Pulse Rate: 67 (01/21 1328)  Labs: Recent Labs    04/30/22 1931 04/30/22 2009 04/30/22 2009 05/01/22 0058 05/01/22 2131 05/02/22 0102 05/02/22 1014  HGB  --  12.9   < > 12.2  --  12.2  --   HCT  --  39.8  --  37.1  --  37.0  --   PLT  --  230  --  208  --  205  --   APTT  --   --   --   --   --  27  --   LABPROT 13.1  --   --   --   --   --   --   INR 1.0  --   --   --   --   --   --   HEPARINUNFRC  --   --   --   --   --  <0.10* >1.10*  CREATININE  --   --   --  1.25* 1.35* 1.32*  --    < > = values in this interval not displayed.     Estimated Creatinine Clearance: 20.2 mL/min (A) (by C-G formula based on SCr of 1.32 mg/dL (H)).   Medical History: Past Medical History:  Diagnosis Date   Arthritis    Chronic renal insufficiency, stage III (moderate) (HCC)    Depression    Dyslipidemia    Forgetfulness    GERD (gastroesophageal reflux disease)    takes Omeprazole for Hital Hernia    Hiatal hernia    History of nuclear stress test 2005   persantine; inferoapical ischmeia w/ EF 83%; no CAD at cath   [facility] (hard of hearing)    hearing aids   Hypertension    Hypothyroidism    LBBB (left bundle branch block)    chronic per Dr. Gwenlyn Found   Lung nodule 07/01/2003   right  middle lobe   Macular degeneration    Malignant melanoma ([place]) 11/12/2004   L upper arm, MOHS_ right hand, leg   Murmur, cardiac    NSTEMI (non-ST elevated myocardial infarction) ([place]) 03/05/2019   Non-STEMI   Osteoporosis    Pneumonia 06/2018   Pulmonary embolism ([place]) 11/16/2019   Pulmonary embolism    Medications:  PTA apixaban 2.'5mg'$  once daily - LD taken 1/19 between 11am-noon  Assessment: 89 yr female admitted 1/19 pm s/p fall with acute respiratory failure with hypoxis.  PMH significant for PE in 2021 and is on Apixaban (pt currently taking apixaban 2.'5mg'$  once daily). D-Dimer elevated 10.38 Baseline INR = 1.0 Hgb/Platelet count wnl HL was >1.10, supratherapeutic  Goal of Therapy:  Heparin level 0.3-0.7 units/ml aPTT 66-102 seconds Monitor platelets by anticoagulation protocol: Yes  Plan:  Stop heparin drip for one hour and then resume at 600 units/hr Check HL 8 hr after heparin infusion starts Monitor daily HL and CBC F/u results of VQ scan Monitor for signs and symptoms of bleeding   Royetta Asal, PharmD, BCPS 05/02/2022 1:32 PM

## 2022-05-02 NOTE — Progress Notes (Addendum)
ANTICOAGULATION CONSULT NOTE - Initial Consult  Pharmacy Consult for Heparin (on apixaban PTA) Indication: r/o PE   Allergies  Allergen Reactions   Fish Allergy Hives, Nausea And Vomiting, Swelling and Other (See Comments)    Redness    Diclofenac Other (See Comments)    Not allergic per caregiver    Ibuprofen Other (See Comments)    Affects kidney function   Sulfasalazine Other (See Comments)    Unknown    Sulfa Antibiotics     Unknown     Patient Measurements: Height: 5' 2.99" (160 cm) Weight: 51.2 kg (112 lb 14 oz) IBW/kg (Calculated) : 52.38 Heparin Dosing Weight: actual body weight  Vital Signs: Temp: 98.5 F (36.9 C) (01/20 2213) BP: 156/82 (01/20 2213) Pulse Rate: 64 (01/20 2213)  Labs: Recent Labs    04/30/22 1931 04/30/22 2009 05/01/22 0058 05/01/22 2131  HGB  --  12.9 12.2  --   HCT  --  39.8 37.1  --   PLT  --  230 208  --   LABPROT 13.1  --   --   --   INR 1.0  --   --   --   CREATININE  --   --  1.25* 1.35*    Estimated Creatinine Clearance: 19.3 mL/min (A) (by C-G formula based on SCr of 1.35 mg/dL (H)).   Medical History: Past Medical History:  Diagnosis Date   Arthritis    Chronic renal insufficiency, stage III (moderate) (HCC)    Depression    Dyslipidemia    Forgetfulness    GERD (gastroesophageal reflux disease)    takes Omeprazole for Hital Hernia    Hiatal hernia    History of nuclear stress test 2005   persantine; inferoapical ischmeia w/ EF 83%; no CAD at cath   [facility] (hard of hearing)    hearing aids   Hypertension    Hypothyroidism    LBBB (left bundle branch block)    chronic per Dr. Gwenlyn Found   Lung nodule 07/01/2003   right middle lobe   Macular degeneration    Malignant melanoma ([place]) 11/12/2004   L upper arm, MOHS_ right hand, leg   Murmur, cardiac    NSTEMI (non-ST elevated myocardial infarction) ([place]) 03/05/2019   Non-STEMI   Osteoporosis    Pneumonia 06/2018   Pulmonary embolism ([place]) 11/16/2019   Pulmonary  embolism    Medications:  PTA apixaban 2.'5mg'$  once daily - LD taken 1/19 between 11am-noon  Assessment: 89 yr female admitted 1/19 pm s/p fall with acute respiratory failure with hypoxis.  PMH significant for PE in 2021 and is on Apixaban (pt currently taking apixaban 2.'5mg'$  once daily). D-Dimer elevated 10.38 Baseline INR = 1.0 Hgb/Platelet count wnl Pharmacy consulted to begin IV heparin to empirically treat for PE due to elevated d-dimer until PE can be ruled out.  VQ scan ordered. Apixaban can falsely elevate heparin levels.  If baseline heparin level is elevated, will monitor heparin with aPTT until effects of apixaban have worn off and aPTT and heparin levels correlate  Goal of Therapy:  Heparin level 0.3-0.7 units/ml aPTT 66-102 seconds Monitor platelets by anticoagulation protocol: Yes   Plan:  Obtain baseline aPTT and heparin level to assess if lingering effects of apixaban remain Using [place] nomogram:       Heparin 1500 units IV bolus x 1       Heparin gtt @ 750 units/hr Check aPTT/HL 8 hr after heparin infusion starts Monitor daily HL and CBC F/u  results of VQ scan If baseline HL is elevated, will monitor heparin therapy using aPTT until effects of apixaban have worn off and aPTT and heparin levels correlate  Sedalia Greeson, Toribio Harbour, PharmD 05/02/2022,12:47 AM  ADDENDUM: Baseline heparin level < 0.1 and aPTT = 27sec As heparin level undetectable, will monitor heparin infusion therapy with HEPARIN LEVELS.  Leone Haven, PharmD 05/02/2022 @ 01:36

## 2022-05-03 ENCOUNTER — Inpatient Hospital Stay (HOSPITAL_COMMUNITY): Payer: Medicare Other

## 2022-05-03 DIAGNOSIS — J9601 Acute respiratory failure with hypoxia: Secondary | ICD-10-CM | POA: Diagnosis not present

## 2022-05-03 LAB — HEPARIN LEVEL (UNFRACTIONATED)
Heparin Unfractionated: 0.23 IU/mL — ABNORMAL LOW (ref 0.30–0.70)
Heparin Unfractionated: 0.22 IU/mL — ABNORMAL LOW (ref 0.30–0.70)

## 2022-05-03 LAB — COMPREHENSIVE METABOLIC PANEL WITH GFR
ALT: 11 U/L (ref 0–44)
AST: 17 U/L (ref 15–41)
Albumin: 3.5 g/dL (ref 3.5–5.0)
Alkaline Phosphatase: 45 U/L (ref 38–126)
Anion gap: 9 (ref 5–15)
BUN: 34 mg/dL — ABNORMAL HIGH (ref 8–23)
CO2: 21 mmol/L — ABNORMAL LOW (ref 22–32)
Calcium: 9 mg/dL (ref 8.9–10.3)
Chloride: 107 mmol/L (ref 98–111)
Creatinine, Ser: 1.17 mg/dL — ABNORMAL HIGH (ref 0.44–1.00)
GFR, Estimated: 42 mL/min — ABNORMAL LOW
Glucose, Bld: 105 mg/dL — ABNORMAL HIGH (ref 70–99)
Potassium: 3.6 mmol/L (ref 3.5–5.1)
Sodium: 137 mmol/L (ref 135–145)
Total Bilirubin: 0.9 mg/dL (ref 0.3–1.2)
Total Protein: 7.1 g/dL (ref 6.5–8.1)

## 2022-05-03 LAB — CBC
HCT: 31.8 % — ABNORMAL LOW (ref 36.0–46.0)
Hemoglobin: 9.9 g/dL — ABNORMAL LOW (ref 12.0–15.0)
MCH: 32 pg (ref 26.0–34.0)
MCHC: 31.1 g/dL (ref 30.0–36.0)
MCV: 102.9 fL — ABNORMAL HIGH (ref 80.0–100.0)
Platelets: 177 10*3/uL (ref 150–400)
RBC: 3.09 MIL/uL — ABNORMAL LOW (ref 3.87–5.11)
RDW: 13.9 % (ref 11.5–15.5)
WBC: 7.3 10*3/uL (ref 4.0–10.5)
nRBC: 0 % (ref 0.0–0.2)

## 2022-05-03 LAB — GLUCOSE, CAPILLARY
Glucose-Capillary: 104 mg/dL — ABNORMAL HIGH (ref 70–99)
Glucose-Capillary: 123 mg/dL — ABNORMAL HIGH (ref 70–99)
Glucose-Capillary: 130 mg/dL — ABNORMAL HIGH (ref 70–99)
Glucose-Capillary: 137 mg/dL — ABNORMAL HIGH (ref 70–99)
Glucose-Capillary: 99 mg/dL (ref 70–99)

## 2022-05-03 LAB — MAGNESIUM: Magnesium: 2.2 mg/dL (ref 1.7–2.4)

## 2022-05-03 MED ORDER — TECHNETIUM TO 99M ALBUMIN AGGREGATED
3.7800 | Freq: Once | INTRAVENOUS | Status: AC
  Administered 2022-05-03: 3.78 via INTRAVENOUS

## 2022-05-03 NOTE — Progress Notes (Addendum)
Overnight   NAME: Cindy [name] MRN: [id number] DOB : 08-29-1922    Date of Service   05/03/2022   HPI/Events of Note   Notified by Dr Thornton Papas for results of Scan ordered earlier.  In part:  IMPRESSION: Segmental perfusion defects in RIGHT middle lobe and RIGHT lower lobe, which appear on chest radiograph.   Findings are consistent with pulmonary emboli.   However, patient has prior CTA exam demonstrated large saddle embolus at the RIGHT pulmonary artery extending into RIGHT middle and RIGHT lower lobes, uncertain if these represent acute or chronic pulmonary emboli.   Electronically Signed: By: Lavonia Dana M.D. On: 05/03/2022 20:53 ======================================= As noted prior:  History of PE on Eliquis: Patient is potentially taking subtherapeutic regimen of Eliquis.  Currently on heparin as above  Patient was however found to have a markedly elevated D-dimer concerning for pulmonary embolism. Patient was placed on an empiric regimen of intravenous heparin and after a discussion about diagnostic options with family VQ scan was ordered.   Heparin is currently under Pharmacy Consult.    Interventions/ Plan   Known situation of PE  Recommended- Continue Heparin under [place] BSN MSNA MSN ACNPC-AG Acute Care Nurse Practitioner [place]

## 2022-05-03 NOTE — Progress Notes (Signed)
PROGRESS NOTE Cindy [name]  GYK:[id number] DOB: 09/23/1922 DOA: 04/30/2022 PCP: Michael Boston, MD   Brief Narrative/Hospital Course: [phone] w/ CKD IIIa, hypertension, orthostatic hypotension, prior pulmonary embolism on Eliquis presented to ED status post fall at home. IN ZE:SPQZR to be in acute hypoxic respiratory failur,acute L2 fracture on trauma CT survey.   Due to new oxygen requirement the hospitalist group was called to assess the patient for admission to the hospital. Despite serial chest imaging patient not found to have any evidence of pneumonia.  Patient was however found to have a markedly elevated D-dimer concerning for pulmonary embolism.  Patient was placed on an empiric regimen of intravenous heparin and after a discussion about diagnostic options with family VQ scan was ordered.  Concerning patient's identified L2 fracture likely suffered during the patient's witness fall this was worked up further with MRI of the lumbar spine.In the absence of significant pain kyphoplasty or neurosurgery consultation was felt not to be necessary and conservative management was pursued. TSLO brace ordered along w/ PTOT.  Subjective: Seen and examined.  Patient is alert awake no back pain.  Caregiver at the bedside. Overnight afebrile BP 150s to 180s Labs reviewed stable renal function 1.1 from 1.3, remains on heparin drip Synthroid PPI and statin   Assessment and Plan: Principal Problem:   Acute respiratory failure with hypoxia (HCC) Active Problems:   Fall at home, initial encounter   Closed compression fracture of L2 vertebra (HCC)   Chronic kidney disease, stage 3a ([place])   GERD without esophagitis   Mixed hyperlipidemia   DNR (do not resuscitate)/DNI(Do Not Intubate)   Essential hypertension   History of pulmonary embolism   Acute respiratory failure with hypoxia Elevated D-dimer concerning for PE: Continue current heparin drip.  VQ scan pending  Fall at home: PT OT elevation.  L2  fracture on CT/MRI: .In the absence of significant pain kyphoplasty or neurosurgery consultation was felt not to be necessary and conservative management was pursued. TSLO brace ordered along w/ PTOT.Patient had some pain during transport in the ED with PT OT evaluation ambulation to check the baby status.  History of PE on Eliquis: Patient is potentially taking subtherapeutic regimen of Eliquis.  Currently on heparin as above  CKD stage IIIa: Renal function stable Recent Labs    05/01/22 0058 05/01/22 2131 05/02/22 0102 05/03/22 0406  BUN 29* 32* 33* 34*  CREATININE 1.25* 1.35* 1.32* 1.17*    GERD: Continue PPI HLD: Continue Zocor 40 mg HTN: BP uncontrolled  Pressure injury of coccyx POA. Pressure Injury 04/30/22 Coccyx Right;Left;Medial Deep Tissue Pressure Injury - Purple or maroon localized area of discolored intact skin or blood-filled blister due to damage of underlying soft tissue from pressure and/or shear. (Active)  04/30/22 2345  Location: Coccyx  Location Orientation: Right;Left;Medial  Staging: Deep Tissue Pressure Injury - Purple or maroon localized area of discolored intact skin or blood-filled blister due to damage of underlying soft tissue from pressure and/or shear.  Wound Description (Comments):   Present on Admission: Yes  Dressing Type Foam - Lift dressing to assess site every shift 05/03/22 0816    DVT prophylaxis: SCDs Start: 05/01/22 0016 Code Status:   Code Status: DNR Family Communication: plan of care discussed with patient/caregiver at bedside. Patient status is: Inpatient because of hypoxia Level of care: Med-Surg   Dispo: The patient is from: From home with caregiver            Anticipated disposition: TBD pending PT OT evaluation  Objective: Vitals last 24 hrs: Vitals:   05/02/22 0643 05/02/22 1008 05/02/22 1328 05/02/22 2021  BP: (!) 180/79 (!) 155/78 (!) 171/80 (!) 186/97  Pulse: 66 62 67 72  Resp: '20 18 16 16  '$ Temp: 98.5 F (36.9 C) 98.5  F (36.9 C) 97.7 F (36.5 C) 98 F (36.7 C)  TempSrc:  Oral  Axillary  SpO2: 99% 100% 99% 97%  Weight:      Height:       Weight change:   Physical Examination: General exam: alert awake, elderly, on nasal cannula oxygen.  HEENT:Oral mucosa moist, Ear/Nose WNL grossly Respiratory system: bilaterally clear BS, no use of accessory muscle Cardiovascular system: S1 & S2 +, No JVD. Gastrointestinal system: Abdomen soft,NT,ND, BS+ Nervous System:Alert, awake, moving extremities. Extremities: LE edema neg,distal peripheral pulses palpable.  Skin: No rashes,no icterus. MSK: Normal muscle bulk,tone, power  Medications reviewed:  Scheduled Meds:  fosfomycin  3 g Oral Once   levothyroxine  88 mcg Oral QAC breakfast   pantoprazole  40 mg Oral Daily   simvastatin  20 mg Oral QPM   Continuous Infusions:  heparin 600 Units/hr (05/03/22 0429)   Diet Order             Diet regular Room service appropriate? Yes; Fluid consistency: Thin  Diet effective now                  Unresulted Labs (From admission, onward)     Start     Ordered   05/03/22 0543  Heparin level (unfractionated)  Once,   R        05/03/22 0543   05/03/22 0500  CBC  Daily,   R      05/02/22 0103   05/03/22 0500  Heparin level (unfractionated)  Daily,   R      05/02/22 2319   05/02/22 0101  Miscellaneous [facility] test (send-out)  Once,   R        05/02/22 0101          Data Reviewed: I have personally reviewed following labs and imaging studies CBC: Recent Labs  Lab 04/30/22 2009 05/01/22 0058 05/02/22 0102  WBC 13.1* 10.7* 9.7  NEUTROABS 11.1* 8.5* 7.4  HGB 12.9 12.2 12.2  HCT 39.8 37.1 37.0  MCV 97.3 97.9 98.4  PLT 230 208 983   Basic Metabolic Panel: Recent Labs  Lab 05/01/22 0058 05/01/22 2131 05/02/22 0102 05/03/22 0406  NA 137 137 139 137  K 3.8 4.0 3.7 3.6  CL 111 108 108 107  CO2 20* 17* 22 21*  GLUCOSE 113* 99 98 105*  BUN 29* 32* 33* 34*  CREATININE 1.25* 1.35* 1.32* 1.17*   CALCIUM 8.5* 8.6* 9.0 9.0  MG  --   --  2.0 2.2  GFR Estimated Creatinine Clearance: 22.7 mL/min (A) (by C-G formula based on SCr of 1.17 mg/dL (H)). Liver Function Tests: Recent Labs  Lab 05/01/22 0058 05/01/22 2131 05/02/22 0102 05/03/22 0406  AST '22 24 18 17  '$ ALT '10 11 12 11  '$ ALKPHOS 42 44 43 45  BILITOT 0.5 0.8 0.8 0.9  PROT 6.7 7.1 7.2 7.1  ALBUMIN 3.5 3.5 3.7 3.5   Recent Labs  Lab 04/30/22 1931  INR 1.0   No results for input(s): "CHOL", "HDL", "LDLCALC", "TRIG", "CHOLHDL", "LDLDIRECT" in the last 72 hours. Thyroid Function Tests: No results for input(s): "TSH", "T4TOTAL", "FREET4", "T3FREE", "THYROIDAB" in the last 72 hours. Sepsis Labs: Recent Labs  Lab 05/01/22  [place] <0.10    Recent Results (from the past 240 hour(s))  Resp panel by RT-PCR (RSV, Flu A&B, Covid) Anterior Nasal Swab     Status: None   Collection Time: 04/30/22  6:59 PM   Specimen: Anterior Nasal Swab  Result Value Ref Range Status   SARS Coronavirus 2 by RT PCR NEGATIVE NEGATIVE Final    Comment: (NOTE) SARS-CoV-2 target nucleic acids are NOT DETECTED.  The SARS-CoV-2 RNA is generally detectable in upper respiratory specimens during the acute phase of infection. The lowest concentration of SARS-CoV-2 viral copies this assay can detect is 138 copies/mL. A negative result does not preclude SARS-Cov-2 infection and should not be used as the sole basis for treatment or other patient management decisions. A negative result may occur with  improper specimen collection/handling, submission of specimen other than nasopharyngeal swab, presence of viral mutation(s) within the areas targeted by this assay, and inadequate number of viral copies(<138 copies/mL). A negative result must be combined with clinical observations, patient history, and epidemiological information. The expected result is Negative.  Fact Sheet for Patients:  EntrepreneurPulse.com.au  Fact  Sheet for Healthcare Providers:  IncredibleEmployment.be  This test is no t yet approved or cleared by the [place] FDA and  has been authorized for detection and/or diagnosis of SARS-CoV-2 by FDA under an Emergency Use Authorization (EUA). This EUA [name] remain  in effect (meaning this test can be used) for the duration of the COVID-19 declaration under Section 564(b)(1) of the Act, 21 U.S.C.section 360bbb-3(b)(1), unless the authorization is terminated  or revoked sooner.       Influenza A by PCR NEGATIVE NEGATIVE Final   Influenza B by PCR NEGATIVE NEGATIVE Final    Comment: (NOTE) The Xpert Xpress SARS-CoV-2/FLU/RSV plus assay is intended as an aid in the diagnosis of influenza from Nasopharyngeal swab specimens and should not be used as a sole basis for treatment. Nasal washings and aspirates are unacceptable for Xpert Xpress SARS-CoV-2/FLU/RSV testing.  Fact Sheet for Patients: EntrepreneurPulse.com.au  Fact Sheet for Healthcare Providers: IncredibleEmployment.be  This test is not yet approved or cleared by the [place] FDA and has been authorized for detection and/or diagnosis of SARS-CoV-2 by FDA under an Emergency Use Authorization (EUA). This EUA [name] remain in effect (meaning this test can be used) for the duration of the COVID-19 declaration under Section 564(b)(1) of the Act, 21 U.S.C. section 360bbb-3(b)(1), unless the authorization is terminated or revoked.     Resp Syncytial Virus by PCR NEGATIVE NEGATIVE Final    Comment: (NOTE) Fact Sheet for Patients: EntrepreneurPulse.com.au  Fact Sheet for Healthcare Providers: IncredibleEmployment.be  This test is not yet approved or cleared by the [place] FDA and has been authorized for detection and/or diagnosis of SARS-CoV-2 by FDA under an Emergency Use Authorization (EUA). This EUA [name] remain in effect  (meaning this test can be used) for the duration of the COVID-19 declaration under Section 564(b)(1) of the Act, 21 U.S.C. section 360bbb-3(b)(1), unless the authorization is terminated or revoked.  Performed at [facility], [place] [address], [place], [place] [address]   Culture, blood (Routine X 2) w Reflex to ID Panel     Status: None (Preliminary result)   Collection Time: 05/01/22 12:01 PM   Specimen: BLOOD  Result Value Ref Range Status   Specimen Description BLOOD LEFT ANTECUBITAL  Final   Special Requests   Final    BOTTLES DRAWN AEROBIC ONLY Blood Culture adequate volume  Culture   Final    NO GROWTH 2 DAYS Performed at [facility], [place] [address], [place], [place] [address]    Report Status PENDING  Incomplete  Culture, blood (Routine X 2) w Reflex to ID Panel     Status: None (Preliminary result)   Collection Time: 05/01/22 12:01 PM   Specimen: BLOOD LEFT HAND  Result Value Ref Range Status   Specimen Description BLOOD LEFT HAND  Final   Special Requests   Final    BOTTLES DRAWN AEROBIC ONLY Blood Culture adequate volume   Culture   Final    NO GROWTH 2 DAYS Performed at [facility], [place] [address] [place], [place] [address]    Report Status PENDING  Incomplete    Antimicrobials: Anti-infectives (From admission, onward)    Start     Dose/Rate Route Frequency Ordered Stop   04/30/22 2230  fosfomycin (MONUROL) packet 3 g        3 g Oral  Once 04/30/22 2224     04/30/22 2100  cefTRIAXone (ROCEPHIN) 2 g in sodium chloride 0.9 % 100 mL IVPB  Status:  Discontinued        2 g 200 mL/hr over 30 Minutes Intravenous Every 24 hours 04/30/22 2051 05/01/22 0016   04/30/22 2100  azithromycin (ZITHROMAX) 500 mg in sodium chloride 0.9 % 250 mL IVPB  Status:  Discontinued        500 mg 250 mL/hr over 60 Minutes Intravenous Every 24 hours 04/30/22 2051 05/01/22 0016      Culture/Microbiology    Component Value Date/Time   SDES BLOOD LEFT  ANTECUBITAL 05/01/2022 1201   SDES BLOOD LEFT HAND 05/01/2022 1201   SPECREQUEST  05/01/2022 1201    BOTTLES DRAWN AEROBIC ONLY Blood Culture adequate volume   SPECREQUEST  05/01/2022 1201    BOTTLES DRAWN AEROBIC ONLY Blood Culture adequate volume   CULT  05/01/2022 1201    NO GROWTH 2 DAYS Performed at [facility], [place] [address] [place], [place] [address]    CULT  05/01/2022 1201    NO GROWTH 2 DAYS Performed at [facility], [place] [address] [place], [place] [address]    REPTSTATUS PENDING 05/01/2022 1201   REPTSTATUS PENDING 05/01/2022 1201  Other culture-see note  Radiology Studies: DG Chest 1 View  Result Date: 05/02/2022 CLINICAL DATA:  Possible pneumonia. EXAM: CHEST  1 VIEW COMPARISON:  06/09/2020, chest CT 04/30/2022 FINDINGS: Patient is rotated to the left. Lungs are adequately inflated and demonstrate opacification of the left base/retrocardiac region compatible with recent CT findings of diaphragmatic hernia with moderate herniation of the stomach into this region. There is adjacent atelectasis in the left lung base. Right lung is clear. Cardiomediastinal silhouette and remainder of the exam is unchanged. IMPRESSION: Opacification of the left base/retrocardiac region compatible with recent CT findings of diaphragmatic hernia with moderate herniation of the stomach into this region. Adjacent atelectasis in the left lung base. Electronically Signed   By: Marin Olp M.D.   On: 05/02/2022 09:59   MR LUMBAR SPINE WO CONTRAST  Result Date: 05/01/2022 CLINICAL DATA:  Compression fracture L2 vertebra seen by CT. Assess acuity. EXAM: MRI LUMBAR SPINE WITHOUT CONTRAST TECHNIQUE: Multiplanar, multisequence MR imaging of the lumbar spine was performed. No intravenous contrast was administered. COMPARISON:  04/30/2022 FINDINGS: Segmentation: 5 lumbar type vertebral bodies as numbered previously. Alignment: 4 mm degenerative anterolisthesis L3-4. 10 mm degenerative  anterolisthesis L4-5. 6 mm degenerative  anterolisthesis L5-S1. Vertebrae: Acute or subacute inferior vertebral body compression fracture at L2 with marrow edema. No other regional injury. Conus medullaris and cauda equina: Conus extends to the L1-2 level. Conus and cauda equina appear normal. Paraspinal and other soft tissues: Negative Disc levels: Mild non-compressive disc bulges from T12-L1 through L2-3. L3-4: Facet arthropathy with 4 mm of anterolisthesis. Bulging of the disc. Stenosis of the lateral recesses right more than left. Definite neural compression is not established. L4-5: Facet arthropathy with 1 cm of anterolisthesis. Bulging of the disc. Severe multifactorial stenosis that could cause neural compression on either or both sides. L5-S1: Facet arthropathy with 6 mm of anterolisthesis. No compressive canal or foraminal narrowing. IMPRESSION: 1. Acute or subacute inferior vertebral body compression fracture at L2 with marrow edema. No retropulsed bone. This could be a cause of regional pain. This looks like a benign fracture. 2. L3-4: Facet arthropathy with 4 mm of anterolisthesis. Bulging of the disc. Mild stenosis of the lateral recesses right more than left. Definite neural compression is not established. 3. L4-5: Facet arthropathy with 1 cm of anterolisthesis. Bulging of the disc. Severe multifactorial stenosis that could cause neural compression on either or both sides. 4. L5-S1: Facet arthropathy with 6 mm of anterolisthesis. No compressive stenosis. Electronically Signed   By: Nelson Chimes M.D.   On: 05/01/2022 11:47     LOS: 2 days   Antonieta Pert, MD [facility]  05/03/2022, 9:56 AM

## 2022-05-03 NOTE — Progress Notes (Signed)
PT Cancellation Note  Patient Details Name: Cindy [name] MRN: [id number] DOB: Aug 31, 1922   Cancelled Treatment:    Reason Eval/Treat Not Completed: Other (comment);Patient not medically ready Pt still awaiting VQ scan. Will hold PT evaluation, pending results of scan.  Lavone Nian, PT, DPT 05/03/22, 9:42 AM   Waunita Schooner 05/03/2022, 9:42 AM

## 2022-05-03 NOTE — Progress Notes (Signed)
Orthopedic Tech Progress Note Patient Details:  Cindy [name] June 22, 1922 [id number]  Patient ID: Cindy [name], female   DOB: April 13, 1922, 89 y.o.   MRN: [id number]  Cindy [name] 05/03/2022, 12:49 PM Tlso placed in room for use when OOB

## 2022-05-03 NOTE — Progress Notes (Signed)
ANTICOAGULATION CONSULT NOTE  Pharmacy Consult for Heparin (on apixaban PTA) Indication: r/o PE   Allergies  Allergen Reactions   Fish Allergy Hives, Nausea And Vomiting, Swelling and Other (See Comments)    Redness    Diclofenac Other (See Comments)    Not allergic per caregiver    Ibuprofen Other (See Comments)    Affects kidney function   Sulfasalazine Other (See Comments)    Unknown    Sulfa Antibiotics     Unknown     Patient Measurements: Height: 5' 2.99" (160 cm) Weight: 52.9 kg (116 lb 10 oz) IBW/kg (Calculated) : 52.38 Heparin Dosing Weight: actual body weight  Vital Signs: Temp: 98.6 F (37 C) (01/22 2114) Temp Source: Oral (01/22 2114) BP: 180/92 (01/22 2114) Pulse Rate: 65 (01/22 2114)  Labs: Recent Labs    05/01/22 0058 05/01/22 2131 05/02/22 0102 05/02/22 1014 05/02/22 2224 05/03/22 0406 05/03/22 1039 05/03/22 2032  HGB 12.2  --  12.2  --   --   --   --  9.9*  HCT 37.1  --  37.0  --   --   --   --  31.8*  PLT 208  --  205  --   --   --   --  177  APTT  --   --  27  --   --   --   --   --   HEPARINUNFRC  --   --  <0.10*   < > 0.44  --  0.23* 0.22*  CREATININE 1.25* 1.35* 1.32*  --   --  1.17*  --   --    < > = values in this interval not displayed.     Estimated Creatinine Clearance: 22.7 mL/min (A) (by C-G formula based on SCr of 1.17 mg/dL (H)).  Medications:  PTA apixaban 2.'5mg'$  once daily - LD taken 1/19 between 11am-noon  Assessment: 89 yr female admitted 1/19 pm s/p fall with acute respiratory failure with hypoxis.  PMH significant for PE in 2021 and is on Apixaban (pt currently taking apixaban 2.'5mg'$  once daily). D-Dimer elevated 10.38 Baseline INR = 1.0 Baseline Hgb/Platelet WNL  05/03/22 Heparin level = 0.22 units/mL, subtherapeutic despite increase in rate to 650 units/hr CBC: Hgb decreased to 9.9, Plt WNL No bleeding issues noted per nursing RN noted IV beeping due to occlusion one time, but this was after heparin level was  collected VQ scan: Findings are consistent with pulmonary emboli. However, patient has prior CTA exam demonstrated large saddle embolus at the RIGHT pulmonary artery extending into RIGHT middle and RIGHT lower lobes, uncertain if these represent acute or chronic pulmonary emboli.  Goal of Therapy:  Heparin level 0.3-0.7 units/mL Monitor platelets by anticoagulation protocol: Yes   Plan:  Increase heparin infusion to 700 units/hr Heparin level 8 hrs after rate increase Monitor daily heparin level and CBC Monitor closely for signs and symptoms of bleeding   Lindell Spar, PharmD, BCPS Clinical Pharmacist 05/03/2022 9:39 PM

## 2022-05-03 NOTE — Progress Notes (Signed)
ANTICOAGULATION CONSULT NOTE -   Consult  Pharmacy Consult for Heparin (on apixaban PTA) Indication: r/o PE   Allergies  Allergen Reactions   Fish Allergy Hives, Nausea And Vomiting, Swelling and Other (See Comments)    Redness    Diclofenac Other (See Comments)    Not allergic per caregiver    Ibuprofen Other (See Comments)    Affects kidney function   Sulfasalazine Other (See Comments)    Unknown    Sulfa Antibiotics     Unknown     Patient Measurements: Height: 5' 2.99" (160 cm) Weight: 52.9 kg (116 lb 10 oz) IBW/kg (Calculated) : 52.38 Heparin Dosing Weight: actual body weight  Vital Signs:    Labs: Recent Labs    04/30/22 1931 04/30/22 2009 04/30/22 2009 05/01/22 0058 05/01/22 2131 05/02/22 0102 05/02/22 0102 05/02/22 1014 05/02/22 2224 05/03/22 0406 05/03/22 1039  HGB  --  12.9   < > 12.2  --  12.2  --   --   --   --   --   HCT  --  39.8  --  37.1  --  37.0  --   --   --   --   --   PLT  --  230  --  208  --  205  --   --   --   --   --   APTT  --   --   --   --   --  27  --   --   --   --   --   LABPROT 13.1  --   --   --   --   --   --   --   --   --   --   INR 1.0  --   --   --   --   --   --   --   --   --   --   HEPARINUNFRC  --   --   --   --   --  <0.10*   < > >1.10* 0.44  --  0.23*  CREATININE  --   --    < > 1.25* 1.35* 1.32*  --   --   --  1.17*  --    < > = values in this interval not displayed.     Estimated Creatinine Clearance: 22.7 mL/min (A) (by C-G formula based on SCr of 1.17 mg/dL (H)).  Medications:  PTA apixaban 2.'5mg'$  once daily - LD taken 1/19 between 11am-noon  Assessment: 89 yr female admitted 1/19 pm s/p fall with acute respiratory failure with hypoxis.  PMH significant for PE in 2021 and is on Apixaban (pt currently taking apixaban 2.'5mg'$  once daily). D-Dimer elevated 10.38 Baseline INR = 1.0 Hgb/Platelet count wnl  05/03/22 Heparin level = 0.23:   sub -therapeutic on  heparin gtt @ 600 units/hr CBC missed today, first  labs rejected due to not enough blood in tube, only HL redrawn No complications of therapy noted To get VQ scan today to r/o PE   Goal of Therapy:  Heparin level 0.3-0.7 Monitor platelets by anticoagulation protocol: Yes   Plan:  increase heparin gtt to 650 units/hr Heparin level 8 hrs after rate increase Monitor daily HL and CBC F/u results of VQ scan Monitor for signs and symptoms of bleeding  Eudelia Bunch, Pharm.D Use secure chat for questions 05/03/2022 11:42 AM

## 2022-05-04 DIAGNOSIS — J9601 Acute respiratory failure with hypoxia: Secondary | ICD-10-CM | POA: Diagnosis not present

## 2022-05-04 LAB — COMPREHENSIVE METABOLIC PANEL
ALT: 9 U/L (ref 0–44)
AST: 18 U/L (ref 15–41)
Albumin: 3.5 g/dL (ref 3.5–5.0)
Alkaline Phosphatase: 44 U/L (ref 38–126)
Anion gap: 10 (ref 5–15)
BUN: 31 mg/dL — ABNORMAL HIGH (ref 8–23)
CO2: 22 mmol/L (ref 22–32)
Calcium: 8.7 mg/dL — ABNORMAL LOW (ref 8.9–10.3)
Chloride: 104 mmol/L (ref 98–111)
Creatinine, Ser: 1.09 mg/dL — ABNORMAL HIGH (ref 0.44–1.00)
GFR, Estimated: 46 mL/min — ABNORMAL LOW (ref >60)
Glucose, Bld: 112 mg/dL — ABNORMAL HIGH (ref 70–99)
Potassium: 3.5 mmol/L (ref 3.5–5.1)
Sodium: 136 mmol/L (ref 135–145)
Total Bilirubin: 0.6 mg/dL (ref 0.3–1.2)
Total Protein: 7.1 g/dL (ref 6.5–8.1)

## 2022-05-04 LAB — CBC
HCT: 33.5 % — ABNORMAL LOW (ref 36.0–46.0)
Hemoglobin: 11.1 g/dL — ABNORMAL LOW (ref 12.0–15.0)
MCH: 32.1 pg (ref 26.0–34.0)
MCHC: 33.1 g/dL (ref 30.0–36.0)
MCV: 96.8 fL (ref 80.0–100.0)
Platelets: 208 10*3/uL (ref 150–400)
RBC: 3.46 MIL/uL — ABNORMAL LOW (ref 3.87–5.11)
RDW: 14 % (ref 11.5–15.5)
WBC: 7.6 10*3/uL (ref 4.0–10.5)
nRBC: 0 % (ref 0.0–0.2)

## 2022-05-04 LAB — MISC LABCORP TEST (SEND OUT): Labcorp test code: 81950

## 2022-05-04 LAB — HEPARIN LEVEL (UNFRACTIONATED)
Heparin Unfractionated: 0.46 IU/mL (ref 0.30–0.70)
Heparin Unfractionated: 0.48 IU/mL (ref 0.30–0.70)

## 2022-05-04 LAB — LIPASE, BLOOD: Lipase: 40 U/L (ref 11–51)

## 2022-05-04 MED ORDER — HYDRALAZINE HCL 50 MG PO TABS: 25.0000 mg | ORAL_TABLET | Freq: Three times a day (TID) | ORAL | Status: DC | PRN

## 2022-05-04 MED ORDER — HYDRALAZINE HCL 50 MG PO TABS
25.0000 mg | ORAL_TABLET | Freq: Three times a day (TID) | ORAL | Status: DC
  Administered 2022-05-04: 25 mg via ORAL
  Filled 2022-05-04: qty 1

## 2022-05-04 MED ORDER — HYDROCODONE-ACETAMINOPHEN 5-325 MG PO TABS
1.0000 | ORAL_TABLET | Freq: Three times a day (TID) | ORAL | Status: DC | PRN
  Administered 2022-05-04 – 2022-05-06 (×2): 1 via ORAL
  Filled 2022-05-04 (×2): qty 1

## 2022-05-04 NOTE — Progress Notes (Addendum)
ANTICOAGULATION CONSULT NOTE - Follow Up Consult  Pharmacy Consult for Heparin Indication: pulmonary embolus (PTA apixaban)   Allergies  Allergen Reactions   Fish Allergy Hives, Nausea And Vomiting, Swelling and Other (See Comments)    Redness    Diclofenac Other (See Comments)    Not allergic per caregiver    Ibuprofen Other (See Comments)    Affects kidney function   Sulfasalazine Other (See Comments)    Unknown    Sulfa Antibiotics     Unknown     Patient Measurements: Height: 5' 2.99" (160 cm) Weight: 46 kg (101 lb 6.6 oz) IBW/kg (Calculated) : 52.38 Heparin Dosing Weight: TBW  Vital Signs: Temp: 97.9 F (36.6 C) (01/23 0538) Temp Source: Oral (01/23 0538) BP: 180/79 (01/23 0538) Pulse Rate: 65 (01/23 0538)  Labs: Recent Labs     0000 05/01/22 2131 05/02/22 0102 05/02/22 1014 05/03/22 0406 05/03/22 1039 05/03/22 2032 05/04/22 0605  HGB   < >  --  12.2  --   --   --  9.9* 11.1*  HCT  --   --  37.0  --   --   --  31.8* 33.5*  PLT  --   --  205  --   --   --  177 208  APTT  --   --  27  --   --   --   --   --   HEPARINUNFRC  --   --  <0.10*   < >  --  0.23* 0.22* 0.46  CREATININE  --  1.35* 1.32*  --  1.17*  --   --   --    < > = values in this interval not displayed.    Estimated Creatinine Clearance: 20 mL/min (A) (by C-G formula based on SCr of 1.17 mg/dL (H)).   Medications:  Infusions:   heparin 700 Units/hr (05/03/22 2153)    Assessment:  89 yoF admitted 1/19 pm s/p fall with acute respiratory failure with hypoxis.  PMH significant for PE (11/2019) and is on Apixaban.  D-Dimer elevated 10.38, Baseline INR 1, HL <0.1, CBC wnl.  1/22 VQ Scan positive for perfusion defects in RML, RLL.  Unclear if acute or chronic PE.  PTA Apixaban 2.5 mg once daily - prescribed as 2.5 BID.  Last dose reportedly 1/19, no recent outpatient fills.  Today, 05/04/2022: Heparin level 0.46, remains therapeutic on heparin 700 units/hr CBC:  Hgb up to 11.1, Plt WNL No  bleeding or complications reported.   Goal of Therapy:  Heparin level 0.3-0.7 units/ml Monitor platelets by anticoagulation protocol: Yes   Plan:  Continue heparin IV infusion at 700 units/hr Heparin level, confirmatory in 8 hours Daily heparin level and CBC Follow up long-term anticoagulation plans.    Gretta Arab PharmD, BCPS [facility] main pharmacy [phone] 05/04/2022 8:15 AM

## 2022-05-04 NOTE — Plan of Care (Signed)

## 2022-05-04 NOTE — Progress Notes (Signed)
PROGRESS NOTE Cindy [name]  CLE:[id number] DOB: December 02, 1922 DOA: 04/30/2022 PCP: Michael Boston, MD   Brief Narrative/Hospital Course: [phone] w/ CKD IIIa, hypertension, orthostatic hypotension, prior pulmonary embolism on Eliquis presented to ED status post fall at home. IN PR:FFMBW to be in acute hypoxic respiratory failur,acute L2 fracture on trauma CT survey.   Due to new oxygen requirement the hospitalist group was called to assess the patient for admission to the hospital. Despite serial chest imaging patient not found to have any evidence of pneumonia.  Patient was however found to have a markedly elevated D-dimer concerning for pulmonary embolism.  Patient was placed on an empiric regimen of intravenous heparin and after a discussion about diagnostic options with family VQ scan was ordered.  Concerning patient's identified L2 fracture likely suffered during the patient's witness fall this was worked up further with MRI of the lumbar spine.In the absence of significant pain kyphoplasty or neurosurgery consultation was felt not to be necessary and conservative management was pursued. TSLO brace ordered along w/ PTOT.  Subjective: Seen and examined this morning caregiver was at the bedside Overnight afebrile  BP has been on higher side in 180s> caregiver reports that she gets high blood pressure with pain she has had issue with low blood pressure with antihypertensive Nursing reports patient had choking episode this morning and also complaining of abdominal pain. On my exam denies any pain in abdomen or back    Assessment and Plan: Principal Problem:   Acute respiratory failure with hypoxia ([place]) Active Problems:   Fall at home, initial encounter   Closed compression fracture of L2 vertebra ([facility])   Chronic kidney disease, stage 3a ([place])   GERD without esophagitis   Mixed hyperlipidemia   DNR (do not resuscitate)/DNI(Do Not Intubate)   Essential hypertension   History of pulmonary  embolism   Acute respiratory failure with hypoxia VQ scan positive for PE  Elevated D-dimer: VQ scan resulted 1/22 night with segmental perfusion defect in the right middle lobe and right lower lobe consistent with pulmonary emboli, however patient is prior CTA with large saddle embolus and right pulmonary artery extending into the right middle and right lower lobe uncertain if this represents acute on chronic pulmonary emboli.  Patient remains on heparin drip.  Obtain echocardiogram  Fall at home: PT OT elevation.  L2 fracture on CT/MRI: MRI acute or subacute inferior vertebral body compression fracture at L2. In the absence of significant pain kyphoplasty or neurosurgery consultation was felt not to be necessary and conservative management was pursued. TSLO brace ordered along w/ PTOT 1/22. Patient had some pain during transport in the ED per caregiver but has not had any pain complaint since admission.Await PTOT eval. added Norco for pain control PRN as patient has had Norco in the past  History of PE on Eliquis: Patient is potentially taking subtherapeutic regimen of Eliquis.  Caregiver informed that she had bleeding issues externally on the skin/bruise and dose was decreased. Currently on heparin as above  CKD stage IIIa: Renal function is stable Recent Labs    05/01/22 0058 05/01/22 2131 05/02/22 0102 05/03/22 0406 05/04/22 1003  BUN 29* 32* 33* 34* 31*  CREATININE 1.25* 1.35* 1.32* 1.17* 1.09*  Choking episode speech consulted keep n.p.o. until then Abdominal pain this am: Abdomen is nontender soft LFTs lipase obtained this morning  GERD: Continue PPI HLD: Continue Zocor 40 mg HTN: BP uncontrolled and started hydralazine 25 mg prn.caregiver informs that patient has had blood pressure issues with  antihypertensive so holding off, continue pain control if needed   Pressure injury of coccyx POA. Pressure Injury 04/30/22 Coccyx Right;Left;Medial Deep Tissue Pressure Injury - Purple or  maroon localized area of discolored intact skin or blood-filled blister due to damage of underlying soft tissue from pressure and/or shear. (Active)  04/30/22 2345  Location: Coccyx  Location Orientation: Right;Left;Medial  Staging: Deep Tissue Pressure Injury - Purple or maroon localized area of discolored intact skin or blood-filled blister due to damage of underlying soft tissue from pressure and/or shear.  Wound Description (Comments):   Present on Admission: Yes  Dressing Type Foam - Lift dressing to assess site every shift 05/03/22 0816    DVT prophylaxis: SCDs Start: 05/01/22 0016 Code Status:   Code Status: DNR Family Communication: plan of care discussed with patient/caregiver at bedside updated. He states he has been updating her son. I offered to call son and if he has any questions  Patient status is: Inpatient because of hypoxia Level of care: Med-Surg   Dispo: The patient is from: From home with caregiver            Anticipated disposition: TBD pending PT OT evaluation  Objective: Vitals last 24 hrs: Vitals:   05/03/22 1548 05/03/22 1620 05/03/22 2114 05/04/22 0538  BP: (!) 190/76 121/70 (!) 180/92 (!) 180/79  Pulse: (!) 59  65 65  Resp: '20  18 17  '$ Temp: (!) 97.5 F (36.4 C)  98.6 F (37 C) 97.9 F (36.6 C)  TempSrc: Oral  Oral Oral  SpO2: 99%  98% 92%  Weight:    46 kg  Height:       Weight change:   Physical Examination: General exam: AA, elderly, frail weak,older appearing HEENT:Oral mucosa moist, Ear/Nose WNL grossly, dentition normal. Respiratory system: bilaterally clear BS,no use of accessory muscle Cardiovascular system: S1 & S2 +, regular rate Gastrointestinal system: Abdomen soft, NT,ND,BS+ Nervous System:Alert, awake, moving extremities and grossly nonfocal Extremities: LE ankle edema neg, lower extremities warm Skin: No rashes,no icterus. MSK: Normal muscle bulk,tone, power   Medications reviewed:  Scheduled Meds:  fosfomycin  3 g Oral  Once   levothyroxine  88 mcg Oral QAC breakfast   pantoprazole  40 mg Oral Daily   simvastatin  20 mg Oral QPM   Continuous Infusions:  heparin 700 Units/hr (05/03/22 2153)   Diet Order             Diet NPO time specified  Diet effective now                  Unresulted Labs (From admission, onward)     Start     Ordered   05/05/22 0500  Heparin level (unfractionated)  Daily,   R      05/03/22 2153   05/04/22 1400  Heparin level (unfractionated)  Once-Timed,   TIMED        05/04/22 0823   05/03/22 0500  CBC  Daily,   R      05/02/22 0103          Data Reviewed: I have personally reviewed following labs and imaging studies CBC: Recent Labs  Lab 04/30/22 2009 05/01/22 0058 05/02/22 0102 05/03/22 2032 05/04/22 0605  WBC 13.1* 10.7* 9.7 7.3 7.6  NEUTROABS 11.1* 8.5* 7.4  --   --   HGB 12.9 12.2 12.2 9.9* 11.1*  HCT 39.8 37.1 37.0 31.8* 33.5*  MCV 97.3 97.9 98.4 102.9* 96.8  PLT 230 208 205 177 208  Basic Metabolic Panel: Recent Labs  Lab 05/01/22 0058 05/01/22 2131 05/02/22 0102 05/03/22 0406 05/04/22 1003  NA 137 137 139 137 136  K 3.8 4.0 3.7 3.6 3.5  CL 111 108 108 107 104  CO2 20* 17* 22 21* 22  GLUCOSE 113* 99 98 105* 112*  BUN 29* 32* 33* 34* 31*  CREATININE 1.25* 1.35* 1.32* 1.17* 1.09*  CALCIUM 8.5* 8.6* 9.0 9.0 8.7*  MG  --   --  2.0 2.2  --   GFR Estimated Creatinine Clearance: 21.4 mL/min (A) (by C-G formula based on SCr of 1.09 mg/dL (H)). Liver Function Tests: Recent Labs  Lab 05/01/22 0058 05/01/22 2131 05/02/22 0102 05/03/22 0406 05/04/22 1003  AST '22 24 18 17 18  '$ ALT '10 11 12 11 9  '$ ALKPHOS 42 44 43 45 44  BILITOT 0.5 0.8 0.8 0.9 0.6  PROT 6.7 7.1 7.2 7.1 7.1  ALBUMIN 3.5 3.5 3.7 3.5 3.5   Recent Labs  Lab 04/30/22 1931  INR 1.0   Recent Labs  Lab 05/01/22 1201  PROCALCITON <0.10   Recent Results (from the past 240 hour(s))  Resp panel by RT-PCR (RSV, Flu A&B, Covid) Anterior Nasal Swab     Status: None    Collection Time: 04/30/22  6:59 PM   Specimen: Anterior Nasal Swab  Result Value Ref Range Status   SARS Coronavirus 2 by RT PCR NEGATIVE NEGATIVE Final    Comment: (NOTE) SARS-CoV-2 target nucleic acids are NOT DETECTED.  The SARS-CoV-2 RNA is generally detectable in upper respiratory specimens during the acute phase of infection. The lowest concentration of SARS-CoV-2 viral copies this assay can detect is 138 copies/mL. A negative result does not preclude SARS-Cov-2 infection and should not be used as the sole basis for treatment or other patient management decisions. A negative result may occur with  improper specimen collection/handling, submission of specimen other than nasopharyngeal swab, presence of viral mutation(s) within the areas targeted by this assay, and inadequate number of viral copies(<138 copies/mL). A negative result must be combined with clinical observations, patient history, and epidemiological information. The expected result is Negative.  Fact Sheet for Patients:  EntrepreneurPulse.com.au  Fact Sheet for Healthcare Providers:  IncredibleEmployment.be  This test is no t yet approved or cleared by the [place] FDA and  has been authorized for detection and/or diagnosis of SARS-CoV-2 by FDA under an Emergency Use Authorization (EUA). This EUA will remain  in effect (meaning this test can be used) for the duration of the COVID-19 declaration under Section 564(b)(1) of the Act, 21 U.S.C.section 360bbb-3(b)(1), unless the authorization is terminated  or revoked sooner.       Influenza A by PCR NEGATIVE NEGATIVE Final   Influenza B by PCR NEGATIVE NEGATIVE Final    Comment: (NOTE) The Xpert Xpress SARS-CoV-2/FLU/RSV plus assay is intended as an aid in the diagnosis of influenza from Nasopharyngeal swab specimens and should not be used as a sole basis for treatment. Nasal washings and aspirates are unacceptable for  Xpert Xpress SARS-CoV-2/FLU/RSV testing.  Fact Sheet for Patients: EntrepreneurPulse.com.au  Fact Sheet for Healthcare Providers: IncredibleEmployment.be  This test is not yet approved or cleared by the [place] FDA and has been authorized for detection and/or diagnosis of SARS-CoV-2 by FDA under an Emergency Use Authorization (EUA). This EUA will remain in effect (meaning this test can be used) for the duration of the COVID-19 declaration under Section 564(b)(1) of the Act, 21 U.S.C. section 360bbb-3(b)(1), unless the authorization is  terminated or revoked.     Resp Syncytial Virus by PCR NEGATIVE NEGATIVE Final    Comment: (NOTE) Fact Sheet for Patients: EntrepreneurPulse.com.au  Fact Sheet for Healthcare Providers: IncredibleEmployment.be  This test is not yet approved or cleared by the [place] FDA and has been authorized for detection and/or diagnosis of SARS-CoV-2 by FDA under an Emergency Use Authorization (EUA). This EUA will remain in effect (meaning this test can be used) for the duration of the COVID-19 declaration under Section 564(b)(1) of the Act, 21 U.S.C. section 360bbb-3(b)(1), unless the authorization is terminated or revoked.  Performed at [facility], [place] [address], [place], [place] [address]   Culture, blood (Routine X 2) w Reflex to ID Panel     Status: None (Preliminary result)   Collection Time: 05/01/22 12:01 PM   Specimen: BLOOD  Result Value Ref Range Status   Specimen Description BLOOD LEFT ANTECUBITAL  Final   Special Requests   Final    BOTTLES DRAWN AEROBIC ONLY Blood Culture adequate volume   Culture   Final    NO GROWTH 3 DAYS Performed at [facility], [place] [address] [place], [place] [address]    Report Status PENDING  Incomplete  Culture, blood (Routine X 2) w Reflex to ID Panel     Status: None (Preliminary result)    Collection Time: 05/01/22 12:01 PM   Specimen: BLOOD LEFT HAND  Result Value Ref Range Status   Specimen Description BLOOD LEFT HAND  Final   Special Requests   Final    BOTTLES DRAWN AEROBIC ONLY Blood Culture adequate volume   Culture   Final    NO GROWTH 3 DAYS Performed at [facility], [place] [address], [place], [place] [address]    Report Status PENDING  Incomplete    Antimicrobials: Anti-infectives (From admission, onward)    Start     Dose/Rate Route Frequency Ordered Stop   04/30/22 2230  fosfomycin (MONUROL) packet 3 g        3 g Oral  Once 04/30/22 2224     04/30/22 2100  cefTRIAXone (ROCEPHIN) 2 g in sodium chloride 0.9 % 100 mL IVPB  Status:  Discontinued        2 g 200 mL/hr over 30 Minutes Intravenous Every 24 hours 04/30/22 2051 05/01/22 0016   04/30/22 2100  azithromycin (ZITHROMAX) 500 mg in sodium chloride 0.9 % 250 mL IVPB  Status:  Discontinued        500 mg 250 mL/hr over 60 Minutes Intravenous Every 24 hours 04/30/22 2051 05/01/22 0016      Culture/Microbiology    Component Value Date/Time   SDES BLOOD LEFT ANTECUBITAL 05/01/2022 1201   SDES BLOOD LEFT HAND 05/01/2022 1201   SPECREQUEST  05/01/2022 1201    BOTTLES DRAWN AEROBIC ONLY Blood Culture adequate volume   SPECREQUEST  05/01/2022 1201    BOTTLES DRAWN AEROBIC ONLY Blood Culture adequate volume   CULT  05/01/2022 1201    NO GROWTH 3 DAYS Performed at [facility], [place] [address], [place], [place] [address]    CULT  05/01/2022 1201    NO GROWTH 3 DAYS Performed at [place] [address] [place], [place] [address]    REPTSTATUS PENDING 05/01/2022 1201   REPTSTATUS PENDING 05/01/2022 1201  Other culture-see note  Radiology Studies: NM Pulmonary Perfusion  Addendum Date: 05/03/2022   ADDENDUM REPORT: 05/03/2022 21:12 ADDENDUM: Findings called to Gershon Cull on  05/03/2022 at 0911 hours. Electronically Signed   By: Lavonia Dana M.D.   On: 05/03/2022 21:12   Result  Date: 05/03/2022 CLINICAL DATA:  Shortness of breath, elevated D-dimer, history of pulmonary embolism, fell at home on 04/30/2022 EXAM: NUCLEAR MEDICINE PERFUSION LUNG SCAN TECHNIQUE: Perfusion images were obtained in multiple projections after intravenous injection of radiopharmaceutical. Ventilation scans intentionally deferred if perfusion scan and chest x-ray adequate for interpretation during COVID 19 epidemic. RADIOPHARMACEUTICALS:  3.78 mCi Tc-17mMAA IV COMPARISON:  Chest radiograph 05/03/2022; CT angio chest 11/16/2019 FINDINGS: Segmental perfusion defect lateral segment RIGHT middle lobe. Additional perfusion defect RIGHT lower lobe. Chest radiograph is clear at these sites. Markedly diminished perfusion at LEFT lung base corresponding to diaphragmatic hernia bowel in the inferior LEFT chest and probable small LEFT pleural effusion. No upper lobe perfusion defects. IMPRESSION: Segmental perfusion defects in RIGHT middle lobe and RIGHT lower lobe, which appear on chest radiograph. Findings are consistent with pulmonary emboli. However, patient has prior CTA exam demonstrated large saddle embolus at the RIGHT pulmonary artery extending into RIGHT middle and RIGHT lower lobes, uncertain if these represent acute or chronic pulmonary emboli. Electronically Signed: By: MLavonia DanaM.D. On: 05/03/2022 20:53   DG Chest Port 1 View  Result Date: 05/03/2022 CLINICAL DATA:  Short of breath EXAM: PORTABLE CHEST 1 VIEW COMPARISON:  Chest x-ray 05/02/2022, CT 04/30/2022, 03/05/2019 FINDINGS: Right infrahilar nodule as seen on prior CT. Mild cardiomegaly with aortic atherosclerosis. Chronic left basilar opacity corresponding to atelectasis and hernia seen on CT. IMPRESSION: No active disease. Chronic left basilar opacity corresponding to atelectasis and hernia seen on CT. Right infrahilar lung nodule, refer to chest CT 04/30/2022. Electronically Signed   By: KDonavan FoilM.D.   On: 05/03/2022 16:52     LOS: 3 days    RAntonieta Pert MD [facility]  05/04/2022, 11:53 AM

## 2022-05-04 NOTE — Progress Notes (Signed)
ANTICOAGULATION CONSULT NOTE - Follow Up Consult  Pharmacy Consult for Heparin Indication: pulmonary embolus (PTA apixaban)   Allergies  Allergen Reactions   Fish Allergy Hives, Nausea And Vomiting, Swelling and Other (See Comments)    Redness    Diclofenac Other (See Comments)    Not allergic per caregiver    Ibuprofen Other (See Comments)    Affects kidney function   Sulfasalazine Other (See Comments)    Unknown    Sulfa Antibiotics     Unknown     Patient Measurements: Height: 5' 2.99" (160 cm) Weight: 46 kg (101 lb 6.6 oz) IBW/kg (Calculated) : 52.38 Heparin Dosing Weight: TBW  Vital Signs: Temp: 97.6 F (36.4 C) (01/23 1154) Temp Source: Axillary (01/23 1154) BP: 140/99 (01/23 1154) Pulse Rate: 65 (01/23 1154)  Labs: Recent Labs    05/02/22 0102 05/02/22 1014 05/03/22 0406 05/03/22 1039 05/03/22 2032 05/04/22 0605 05/04/22 1003 05/04/22 1355  HGB 12.2  --   --   --  9.9* 11.1*  --   --   HCT 37.0  --   --   --  31.8* 33.5*  --   --   PLT 205  --   --   --  177 208  --   --   APTT 27  --   --   --   --   --   --   --   HEPARINUNFRC <0.10*   < >  --    < > 0.22* 0.46  --  0.48  CREATININE 1.32*  --  1.17*  --   --   --  1.09*  --    < > = values in this interval not displayed.     Estimated Creatinine Clearance: 21.4 mL/min (A) (by C-G formula based on SCr of 1.09 mg/dL (H)).   Assessment: 62 yoF admitted 1/19 pm s/p fall with acute respiratory failure with hypoxia. PMH significant for PE (11/2019) and is on Apixaban PTA. D-Dimer elevated 10.38, Baseline INR 1, heparin level < 0.1 units/mL, CBC WNL. 1/22 VQ Scan positive for perfusion defects in RML, RLL. Unclear if acute or chronic PE.  PTA Apixaban 2.5 mg once daily - prescribed as 2.5 BID.  Last dose reportedly 1/19, no recent outpatient fills.  Today, 05/04/2022: Heparin level 0.48 units/mL, remains therapeutic on heparin infusion at 700 units/hr CBC: Hgb up to 11.1, Plt WNL No bleeding or  complications reported.  Goal of Therapy:  Heparin level 0.3-0.7 units/ml Monitor platelets by anticoagulation protocol: Yes   Plan:  Continue IV heparin infusion at 700 units/hr Daily heparin level and CBC Monitor closely for s/sx of bleeding Follow up long-term anticoagulation plans     Lindell Spar, PharmD, BCPS Clinical Pharmacist 05/04/2022 3:28 PM

## 2022-05-04 NOTE — Evaluation (Addendum)
Physical Therapy Evaluation Patient Details Name: Cindy [name] MRN: [id number] DOB: 1922/06/05 Today's Date: 05/04/2022  History of Present Illness  Pt is a 89 yo female admitted with acute respiratory failure with hypoxia. In [id number] to be in acute hypoxic respiratory failure, acute L2 fracture on trauma CT survey. 1/22: Scan identified PE. PMH: CKD IIIa, hypertension, orthostatic hypotension, prior pulmonary embolism on Eliquis  Clinical Impression  Pt admitted with above diagnosis. Pt from home with aide, reports aide stays "as long as she needs". Pt limited by pain, despite prior pain medication. Pt on RA with SpO2 93-95%. Pt able to mobilize BLE freely in bed. TLSO at bedside, but unable to don due to pain with initial rolling assessment. RN notified of limited eval. Recommending SNF, unsure if pt's aide able to assist at current functional level. Pt currently with functional limitations due to the deficits listed below (see PT Problem List). Pt will benefit from skilled PT to increase their independence and safety with mobility to allow discharge to the venue listed below.          Recommendations for follow up therapy are one component of a multi-disciplinary discharge planning process, led by the attending physician.  Recommendations may be updated based on patient status, additional functional criteria and insurance authorization.  Follow Up Recommendations Skilled nursing-short term rehab (<3 hours/day) (vs HHPT pending aide assistance)      Assistance Recommended at Discharge Frequent or constant Supervision/Assistance  Patient can return home with the following  A lot of help with walking and/or transfers;A lot of help with bathing/dressing/bathroom;Assistance with cooking/housework;Assist for transportation;Help with stairs or ramp for entrance    Equipment Recommendations None recommended by PT  Recommendations for Other Services       Functional Status Assessment Patient has  had a recent decline in their functional status and demonstrates the ability to make significant improvements in function in a reasonable and predictable amount of time.     Precautions / Restrictions Precautions Precautions: Fall Precaution Comments: PE, back precautions Required Braces or Orthoses: Spinal Brace Spinal Brace: Thoracolumbosacral orthotic Restrictions Weight Bearing Restrictions: No      Mobility  Bed Mobility Overal bed mobility: Needs Assistance Bed Mobility: Rolling Rolling: Mod assist  General bed mobility comments: mod A to roll R and L, pt reports abdominal pain limiting and declines further movement, cued for log roll maintaining hips and knees in line to avoid twisting    Transfers     Ambulation/Gait     Stairs            Wheelchair Mobility    Modified Rankin (Stroke Patients Only)       Balance         Pertinent Vitals/Pain Pain Assessment Pain Assessment: Faces Faces Pain Scale: Hurts whole lot Pain Location: abdomen with mobility Pain Descriptors / Indicators: Grimacing, Guarding Pain Intervention(s): Limited activity within patient's tolerance, Monitored during session, Premedicated before session, Repositioned    Home Living Family/patient expects to be discharged to:: Private residence Living Arrangements: Other relatives Available Help at Discharge: Personal care attendant;Available PRN/intermittently Type of Home: House Home Access: Stairs to enter   Entrance Stairs-Number of Steps: 2   Home Layout: One level Home Equipment: Conservation officer, nature (2 wheels);Cane - single point;Wheelchair - manual      Prior Function Prior Level of Function : Independent/Modified Independent;Needs assist   Mobility Comments: pt reports no AD use with household ambulation, aide can assist with in/out of bed if needed ADLs  Comments: pt reports ind with self care, aide present if assistance is needed, aide completes household chores      Hand Dominance   Dominant Hand: Right    Extremity/Trunk Assessment   Upper Extremity Assessment Upper Extremity Assessment: Generalized weakness    Lower Extremity Assessment Lower Extremity Assessment: Generalized weakness (AROM WFL, strength grossly 3/5)       Communication   Communication: No difficulties  Cognition Arousal/Alertness: Awake/alert Behavior During Therapy: WFL for tasks assessed/performed Overall Cognitive Status: No family/caregiver present to determine baseline cognitive functioning  General Comments: Pt states name, location, and current president correctly. Pt states year is "20 something". Pt able to follow commands appropriately, pleasant        General Comments      Exercises     Assessment/Plan    PT Assessment Patient needs continued PT services  PT Problem List Decreased strength;Decreased activity tolerance;Decreased balance;Decreased mobility;Pain;Cardiopulmonary status limiting activity;Decreased knowledge of precautions       PT Treatment Interventions DME instruction;Gait training;Functional mobility training;Therapeutic activities;Therapeutic exercise;Balance training    PT Goals (Current goals can be found in the Care Plan section)  Acute Rehab PT Goals Patient Stated Goal: home with aide PT Goal Formulation: With patient Time For Goal Achievement: 05/18/22 Potential to Achieve Goals: Good    Frequency Min 3X/week     Co-evaluation               AM-PAC PT "6 Clicks" Mobility  Outcome Measure Help needed turning from your back to your side while in a flat bed without using bedrails?: A Lot Help needed moving from lying on your back to sitting on the side of a flat bed without using bedrails?: A Lot Help needed moving to and from a bed to a chair (including a wheelchair)?: Total Help needed standing up from a chair using your arms (e.g., wheelchair or bedside chair)?: Total Help needed to walk in hospital room?:  Total Help needed climbing 3-5 steps with a railing? : Total 6 Click Score: 8    End of Session   Activity Tolerance: Patient limited by pain Patient left: in bed;with call bell/phone within reach;with bed alarm set Nurse Communication: Mobility status;Other (comment) (pain and SpO2) PT Visit Diagnosis: Muscle weakness (generalized) (M62.81);Pain Pain - part of body:  (back and abdomen)    Time: [id number] PT Time Calculation (min) (ACUTE ONLY): 18 min   Charges:   PT Evaluation $PT Eval Moderate Complexity: 1 Mod           Tori Larry Knipp PT, DPT 05/04/22, 12:49 PM

## 2022-05-05 DIAGNOSIS — J9601 Acute respiratory failure with hypoxia: Secondary | ICD-10-CM | POA: Diagnosis not present

## 2022-05-05 LAB — CBC
HCT: 35.4 % — ABNORMAL LOW (ref 36.0–46.0)
Hemoglobin: 11.5 g/dL — ABNORMAL LOW (ref 12.0–15.0)
MCH: 32 pg (ref 26.0–34.0)
MCHC: 32.5 g/dL (ref 30.0–36.0)
MCV: 98.6 fL (ref 80.0–100.0)
Platelets: 227 10*3/uL (ref 150–400)
RBC: 3.59 MIL/uL — ABNORMAL LOW (ref 3.87–5.11)
RDW: 13.6 % (ref 11.5–15.5)
WBC: 7.6 10*3/uL (ref 4.0–10.5)
nRBC: 0 % (ref 0.0–0.2)

## 2022-05-05 LAB — HEPARIN LEVEL (UNFRACTIONATED): Heparin Unfractionated: 0.5 IU/mL (ref 0.30–0.70)

## 2022-05-05 NOTE — [place] FL2 (Signed)
[place] MEDICAID FL2 LEVEL OF CARE FORM     IDENTIFICATION  Patient Name: Cindy [name] Birthdate: July 02, 1922 Sex: female Admission Date (Current Location): 04/30/2022  [facility] [place] Number:  Herbalist and Address:  [facility],  [place], [place]      Provider Number: [id number]  Attending Physician Name and Address:  Antonieta Pert, MD  Relative Name and Phone Number:  Giara Mcgaughey (son) [phone]    Current Level of Care: Hospital Recommended Level of Care: [place] Prior Approval Number:    Date Approved/Denied:   PASRR Number: [id number]  Discharge Plan: SNF    Current Diagnoses: Patient Active Problem List   Diagnosis Date Noted   Acute respiratory failure with hypoxia ([place]) 04/30/2022   Closed compression fracture of L2 vertebra ([place]) 04/30/2022   History of pulmonary embolism 04/30/2022   DNR (do not resuscitate)/DNI(Do Not Intubate) 04/30/2022   Fall at home, initial encounter 06/09/2020   Chronic kidney disease, stage 3a ([place]) 06/09/2020   Palliative care by specialist    Goals of care, counseling/discussion    General weakness    Dementia ([place]) 11/16/2019   Hypothyroidism 11/16/2019   Pancreatic mass 11/16/2019   Pain due to onychomycosis of toenails of both feet 10/03/2018   H/O total shoulder replacement, left 08/04/2018   Pressure injury of skin 06/13/2018   Constipation, chronic 06/28/2016   Eventration of left crus of diaphragm 06/27/2016   Uncontrolled hypertension 06/27/2016   CRI (chronic renal insufficiency), stage 4 (severe) ([place]) 06/14/2016   Primary hypothyroidism 06/14/2016   Osteoporosis, postmenopausal 06/14/2016   OAB (overactive bladder) 02/25/2016   Left bundle branch block 12/14/2012   Essential hypertension 12/14/2012   Mixed hyperlipidemia 12/14/2012   Pain in shoulder 11/11/2011   Melanoma of left upper arm ([place]) 10/19/2011   Lung mass 06/16/2011   Acquired  deformities of toe 06/14/2011   Personal history of other diseases of the digestive system 06/07/2011   GERD without esophagitis 06/07/2011    Orientation RESPIRATION BLADDER Height & Weight     Self, Time, Situation, Place  Normal Continent Weight: 46.4 kg Height:  5' 2.99" (160 cm)  BEHAVIORAL SYMPTOMS/MOOD NEUROLOGICAL BOWEL NUTRITION STATUS      Continent Diet (regular)  AMBULATORY STATUS COMMUNICATION OF NEEDS Skin   Limited Assist Verbally Other (Comment), Bruising, Skin abrasions (Pressure Injury 04/30/22 Coccyx Right;Left;Medial Deep Tissue Pressure Injury - Purple or maroon localized area of discolored intact skin or blood-filled blister due to damage of underlying soft tissue from pressure and/or shear.)                       Personal Care Assistance Level of Assistance  Bathing, Feeding, Dressing Bathing Assistance: Limited assistance Feeding assistance: Limited assistance Dressing Assistance: Limited assistance     Functional Limitations Info  Sight, Hearing, Speech Sight Info: Impaired (glasses) Hearing Info: Impaired (bilateral hearing aids) Speech Info: Adequate    SPECIAL CARE FACTORS FREQUENCY  PT (By licensed PT), OT (By licensed OT)     PT Frequency: 5x/week OT Frequency: 5x/week            Contractures Contractures Info: Present    Additional Factors Info  Code Status, Allergies Code Status Info: DNR Allergies Info: Fish Allergy, Diclofenac, Ibuprofen, Sulfasalazine, Sulfa Antibiotics           Current Medications (05/05/2022):  This is the current hospital active medication list Current Facility-Administered Medications  Medication Dose  Route Frequency Provider Last Rate Last Admin   acetaminophen (TYLENOL) tablet 650 mg  650 mg Oral Q6H PRN Kristopher Oppenheim, DO       Or   acetaminophen (TYLENOL) suppository 650 mg  650 mg Rectal Q6H PRN Kristopher Oppenheim, DO       albuterol (PROVENTIL) (2.5 MG/3ML) 0.083% nebulizer solution 2.5 mg  2.5 mg  Nebulization Q4H PRN Shalhoub, Sherryll Burger, MD       fosfomycin (MONUROL) packet 3 g  3 g Oral Once Kristopher Oppenheim, DO       heparin ADULT infusion 100 units/mL (25000 units/240m)  700 Units/hr Intravenous Continuous GLuiz Ochoa RPH 7 mL/hr at 05/05/22 0503 700 Units/hr at 05/05/22 0503   hydrALAZINE (APRESOLINE) injection 5 mg  5 mg Intravenous Q6H PRN SVernelle Emerald MD   5 mg at 05/05/22 0445   hydrALAZINE (APRESOLINE) tablet 25 mg  25 mg Oral Q8H PRN KAntonieta Pert MD       HYDROcodone-acetaminophen (NORCO/VICODIN) 5-325 MG per tablet 1 tablet  1 tablet Oral Q8H PRN KAntonieta Pert MD   1 tablet at 05/04/22 1118   levothyroxine (SYNTHROID) tablet 88 mcg  88 mcg Oral QAC breakfast CKristopher Oppenheim DO   88 mcg at 05/05/22 0503   melatonin tablet 10 mg  10 mg Oral QHS PRN CKristopher Oppenheim DO       ondansetron ([facility] tablet 4 mg  4 mg Oral Q6H PRN CKristopher Oppenheim DO       Or   ondansetron ([facility] injection 4 mg  4 mg Intravenous Q6H PRN CKristopher Oppenheim DO       pantoprazole (PROTONIX) EC tablet 40 mg  40 mg Oral Daily CKristopher Oppenheim DO   40 mg at 05/05/22 09450  simvastatin (ZOCOR) tablet 20 mg  20 mg Oral QPM CKristopher Oppenheim DO   20 mg at 05/04/22 1731     Discharge Medications: Please see discharge summary for a list of discharge medications.  Relevant Imaging Results:  Relevant Lab Results:   Additional Information SSN [id number] KHenrietta Dine RN

## 2022-05-05 NOTE — Progress Notes (Signed)
PROGRESS NOTE Wanna Gully  MGQ:[id number] DOB: [birth year] DOA: 04/30/2022 PCP: Michael [place], MD   Brief Narrative/Hospital Course: [phone] w/ CKD IIIa, hypertension, orthostatic hypotension, prior pulmonary embolism on Eliquis presented to ED status post fall at home. IN WP:YKDXI to be in acute hypoxic respiratory failur,acute L2 fracture on trauma CT survey.   Due to new oxygen requirement the hospitalist group was called to assess the patient for admission to the hospital. Despite serial chest imaging patient not found to have any evidence of pneumonia.  Patient was however found to have a markedly elevated D-dimer concerning for pulmonary embolism.  Patient was placed on an empiric regimen of intravenous heparin and after a discussion about diagnostic options with family VQ scan was ordered.  Concerning patient's identified L2 fracture likely suffered during the patient's witness fall this was worked up further with MRI of the lumbar spine.In the absence of significant pain kyphoplasty or neurosurgery consultation was felt not to be necessary and conservative management was pursued. TSLO brace ordered along w/ PTOT.  PT OT recommending skilled nursing facility  Subjective: Seen and examined this morning resting comfortably she denies any abdominal pain back pain. Nursing reports some pain during transfer Needed hydrocodone x 1 yesterday, added lidocaine patch today.   Assessment and Plan: Principal Problem:   Acute respiratory failure with hypoxia ([place]) Active Problems:   Fall at home, initial encounter   Closed compression fracture of L2 vertebra (HCC)   Chronic kidney disease, stage 3a ([place])   GERD without esophagitis   Mixed hyperlipidemia   DNR (do not resuscitate)/DNI(Do Not Intubate)   Essential hypertension   History of pulmonary embolism   Acute respiratory failure with hypoxia VQ scan positive for PE  Elevated D-dimer: VQ scan resulted 1/22 night with segmental perfusion  defect in the right middle lobe and right lower lobe consistent with pulmonary emboli, however patient is prior CTA with large saddle embolus and right pulmonary artery extending into the right middle and right lower lobe uncertain if this represents acute on chronic pulmonary emboli.  Patient remains on heparin drip> change to Eliquis close to discharge. Hold off on echo for now-she is hemodynamically stable, on room air  Fall at home:PT OT elevation.  L2 fracture on CT/MRI:MRI acute or subacute inferior vertebral body compression fracture at L2. In the absence of significant pain kyphoplasty or neurosurgery consultation was felt not to be necessary and conservative management was pursued. TSLO brace ordered along w/ PTOT >does not complain of back pain although some pain during transfer,added lidocaine patch, continue hydrocodone.Continue to mobilize w/ PT OT. Fu with outpatient neurosurgery. Updated her son- and is agreeable with current plan with conservative management  History of PE on Eliquis: Patient is potentially taking subtherapeutic regimen of Eliquis.  Caregiver informed that she had bleeding issues externally on the skin/bruise and dose was decreased. Currently on heparin as above  CKD stage IIIa: Renal function is stable Recent Labs    05/01/22 0058 05/01/22 2131 05/02/22 0102 05/03/22 0406 05/04/22 1003  BUN 29* 32* 33* 34* 31*  CREATININE 1.25* 1.35* 1.32* 1.17* 1.09*   Choking episode speech consulted , has seen and now on diet Abdominal pain-resolved: LFTs lipase stable GERD: Continue PPI HLD: Continue Zocor 40 mg HTN: BP stable. At times uncontrolled and started hydralazine 25 mg prn.caregiver informs that patient has had blood pressure issues with antihypertensive so holding off, continue pain control if needed   Pressure injury of coccyx POA. Pressure Injury 04/30/22 Coccyx  Right;Left;Medial Deep Tissue Pressure Injury - Purple or maroon localized area of discolored  intact skin or blood-filled blister due to damage of underlying soft tissue from pressure and/or shear. (Active)  04/30/22 2345  Location: Coccyx  Location Orientation: Right;Left;Medial  Staging: Deep Tissue Pressure Injury - Purple or maroon localized area of discolored intact skin or blood-filled blister due to damage of underlying soft tissue from pressure and/or shear.  Wound Description (Comments):   Present on Admission: Yes  Dressing Type Foam - Lift dressing to assess site every shift 05/03/22 0816  DVT prophylaxis: SCDs Start: 05/01/22 0016 Code Status:   Code Status: DNR  Family Communication:plan of care discussed with patient/caregiver at bedside updated. He states he has been updating her son-care team has verbal permission to communicate care plan with the son. Updated her son- and is agreeable with current plan with conservative management, and is agreeable for skilled nursing facility once available  Patient status is: Inpatient because of hypoxia Level of care: Med-Surg   Dispo: The patient is from: From home with caregiver            Anticipated disposition: SNF  Objective: Vitals last 24 hrs: Vitals:   05/04/22 1923 05/05/22 0300 05/05/22 0404 05/05/22 0634  BP: (!) 167/87  (!) 185/87 (!) 169/77  Pulse: (!) 58  (!) 59 60  Resp: 14     Temp: 99 F (37.2 C)  98.3 F (36.8 C)   TempSrc: Oral  Oral   SpO2: 93%  94%   Weight:  46.4 kg    Height:       Weight change: 0.4 kg  Physical Examination: General exam: AA, patient not in distress, is weak HEENT:Oral mucosa moist, Ear/Nose WNL grossly, dentition normal. Respiratory system: bilaterally clear BS, no use of accessory muscle Cardiovascular system: S1 & S2 +, regular rate. Gastrointestinal system: Abdomen soft, NT,ND,BS+ Nervous System:Alert, awake, moving extremities and grossly nonfocal Extremities: LE ankle edema NEG, lower extremities warm Skin: No rashes,no icterus. MSK: Normal muscle bulk,tone,  power    Medications reviewed:  Scheduled Meds:  fosfomycin  3 g Oral Once   levothyroxine  88 mcg Oral QAC breakfast   pantoprazole  40 mg Oral Daily   simvastatin  20 mg Oral QPM   Continuous Infusions:  heparin 700 Units/hr (05/05/22 0503)   Diet Order             Diet regular Room service appropriate? Yes with Assist; Fluid consistency: Thin  Diet effective now                  Unresulted Labs (From admission, onward)     Start     Ordered   05/05/22 0500  Heparin level (unfractionated)  Daily,   R      05/03/22 2153   05/03/22 0500  CBC  Daily,   R      05/02/22 0103          Data Reviewed: I have personally reviewed following labs and imaging studies CBC: Recent Labs  Lab 04/30/22 2009 05/01/22 0058 05/02/22 0102 05/03/22 2032 05/04/22 0605 05/05/22 0356  WBC 13.1* 10.7* 9.7 7.3 7.6 7.6  NEUTROABS 11.1* 8.5* 7.4  --   --   --   HGB 12.9 12.2 12.2 9.9* 11.1* 11.5*  HCT 39.8 37.1 37.0 31.8* 33.5* 35.4*  MCV 97.3 97.9 98.4 102.9* 96.8 98.6  PLT 230 208 205 177 208 185   Basic Metabolic Panel: Recent Labs  Lab  05/01/22 0058 05/01/22 2131 05/02/22 0102 05/03/22 0406 05/04/22 1003  NA 137 137 139 137 136  K 3.8 4.0 3.7 3.6 3.5  CL 111 108 108 107 104  CO2 20* 17* 22 21* 22  GLUCOSE 113* 99 98 105* 112*  BUN 29* 32* 33* 34* 31*  CREATININE 1.25* 1.35* 1.32* 1.17* 1.09*  CALCIUM 8.5* 8.6* 9.0 9.0 8.7*  MG  --   --  2.0 2.2  --   GFR Estimated Creatinine Clearance: 21.6 mL/min (A) (by C-G formula based on SCr of 1.09 mg/dL (H)). Liver Function Tests: Recent Labs  Lab 05/01/22 0058 05/01/22 2131 05/02/22 0102 05/03/22 0406 05/04/22 1003  AST '22 24 18 17 18  '$ ALT '10 11 12 11 9  '$ ALKPHOS 42 44 43 45 44  BILITOT 0.5 0.8 0.8 0.9 0.6  PROT 6.7 7.1 7.2 7.1 7.1  ALBUMIN 3.5 3.5 3.7 3.5 3.5   Recent Labs  Lab 04/30/22 1931  INR 1.0   Recent Labs  Lab 05/01/22 1201  PROCALCITON <0.10   Recent Results (from the past 240 hour(s))  Resp  panel by RT-PCR (RSV, Flu A&B, Covid) Anterior Nasal Swab     Status: None   Collection Time: 04/30/22  6:59 PM   Specimen: Anterior Nasal Swab  Result Value Ref Range Status   SARS Coronavirus 2 by RT PCR NEGATIVE NEGATIVE Final    Comment: (NOTE) SARS-CoV-2 target nucleic acids are NOT DETECTED.  The SARS-CoV-2 RNA is generally detectable in upper respiratory specimens during the acute phase of infection. The lowest concentration of SARS-CoV-2 viral copies this assay can detect is 138 copies/mL. A negative result does not preclude SARS-Cov-2 infection and should not be used as the sole basis for treatment or other patient management decisions. A negative result may occur with  improper specimen collection/handling, submission of specimen other than nasopharyngeal swab, presence of viral mutation(s) within the areas targeted by this assay, and inadequate number of viral copies(<138 copies/mL). A negative result must be combined with clinical observations, patient history, and epidemiological information. The expected result is Negative.  Fact Sheet for Patients:  EntrepreneurPulse.com.au  Fact Sheet for Healthcare Providers:  IncredibleEmployment.be  This test is no t yet approved or cleared by the [place] FDA and  has been authorized for detection and/or diagnosis of SARS-CoV-2 by FDA under an Emergency Use Authorization (EUA). This EUA will remain  in effect (meaning this test can be used) for the duration of the COVID-19 declaration under Section 564(b)(1) of the Act, 21 U.S.C.section 360bbb-3(b)(1), unless the authorization is terminated  or revoked sooner.       Influenza A by PCR NEGATIVE NEGATIVE Final   Influenza B by PCR NEGATIVE NEGATIVE Final    Comment: (NOTE) The Xpert Xpress SARS-CoV-2/FLU/RSV plus assay is intended as an aid in the diagnosis of influenza from Nasopharyngeal swab specimens and should not be used as a  sole basis for treatment. Nasal washings and aspirates are unacceptable for Xpert Xpress SARS-CoV-2/FLU/RSV testing.  Fact Sheet for Patients: EntrepreneurPulse.com.au  Fact Sheet for Healthcare Providers: IncredibleEmployment.be  This test is not yet approved or cleared by the [place] FDA and has been authorized for detection and/or diagnosis of SARS-CoV-2 by FDA under an Emergency Use Authorization (EUA). This EUA will remain in effect (meaning this test can be used) for the duration of the COVID-19 declaration under Section 564(b)(1) of the Act, 21 U.S.C. section 360bbb-3(b)(1), unless the authorization is terminated or revoked.  Resp Syncytial Virus by PCR NEGATIVE NEGATIVE Final    Comment: (NOTE) Fact Sheet for Patients: EntrepreneurPulse.com.au  Fact Sheet for Healthcare Providers: IncredibleEmployment.be  This test is not yet approved or cleared by the [place] FDA and has been authorized for detection and/or diagnosis of SARS-CoV-2 by FDA under an Emergency Use Authorization (EUA). This EUA will remain in effect (meaning this test can be used) for the duration of the COVID-19 declaration under Section 564(b)(1) of the Act, 21 U.S.C. section 360bbb-3(b)(1), unless the authorization is terminated or revoked.  Performed at [facility], [place] [address] [place], [place] [address]   Culture, blood (Routine X 2) w Reflex to ID Panel     Status: None (Preliminary result)   Collection Time: 05/01/22 12:01 PM   Specimen: BLOOD  Result Value Ref Range Status   Specimen Description BLOOD LEFT ANTECUBITAL  Final   Special Requests   Final    BOTTLES DRAWN AEROBIC ONLY Blood Culture adequate volume   Culture   Final    NO GROWTH 4 DAYS Performed at [facility], [place] [address] [place], [place] [address]    Report Status PENDING  Incomplete  Culture, blood  (Routine X 2) w Reflex to ID Panel     Status: None (Preliminary result)   Collection Time: 05/01/22 12:01 PM   Specimen: BLOOD LEFT HAND  Result Value Ref Range Status   Specimen Description BLOOD LEFT HAND  Final   Special Requests   Final    BOTTLES DRAWN AEROBIC ONLY Blood Culture adequate volume   Culture   Final    NO GROWTH 4 DAYS Performed at [facility], [place] [address], [place], [place] [address]    Report Status PENDING  Incomplete    Antimicrobials: Anti-infectives (From admission, onward)    Start     Dose/Rate Route Frequency Ordered Stop   04/30/22 2230  fosfomycin (MONUROL) packet 3 g        3 g Oral  Once 04/30/22 2224     04/30/22 2100  cefTRIAXone (ROCEPHIN) 2 g in sodium chloride 0.9 % 100 mL IVPB  Status:  Discontinued        2 g 200 mL/hr over 30 Minutes Intravenous Every 24 hours 04/30/22 2051 05/01/22 0016   04/30/22 2100  azithromycin (ZITHROMAX) 500 mg in sodium chloride 0.9 % 250 mL IVPB  Status:  Discontinued        500 mg 250 mL/hr over 60 Minutes Intravenous Every 24 hours 04/30/22 2051 05/01/22 0016      Culture/Microbiology    Component Value Date/Time   SDES BLOOD LEFT ANTECUBITAL 05/01/2022 1201   SDES BLOOD LEFT HAND 05/01/2022 1201   SPECREQUEST  05/01/2022 1201    BOTTLES DRAWN AEROBIC ONLY Blood Culture adequate volume   SPECREQUEST  05/01/2022 1201    BOTTLES DRAWN AEROBIC ONLY Blood Culture adequate volume   CULT  05/01/2022 1201    NO GROWTH 4 DAYS Performed at [facility], [place] [address] [place], [place] [address]    CULT  05/01/2022 1201    NO GROWTH 4 DAYS Performed at [facility], [place] [address], [place], [place] [address]    REPTSTATUS PENDING 05/01/2022 1201   REPTSTATUS PENDING 05/01/2022 1201  Other culture-see note  Radiology Studies: NM Pulmonary Perfusion  Addendum Date: 05/03/2022   ADDENDUM REPORT: 05/03/2022 21:12 ADDENDUM: Findings called to Gershon Cull on 05/03/2022 at 0911 hours.  Electronically Signed  By: Lavonia Dana M.D.   On: 05/03/2022 21:12   Result Date: 05/03/2022 CLINICAL DATA:  Shortness of breath, elevated D-dimer, history of pulmonary embolism, fell at home on 04/30/2022 EXAM: NUCLEAR MEDICINE PERFUSION LUNG SCAN TECHNIQUE: Perfusion images were obtained in multiple projections after intravenous injection of radiopharmaceutical. Ventilation scans intentionally deferred if perfusion scan and chest x-ray adequate for interpretation during COVID 19 epidemic. RADIOPHARMACEUTICALS:  3.78 mCi Tc-64mMAA IV COMPARISON:  Chest radiograph 05/03/2022; CT angio chest 11/16/2019 FINDINGS: Segmental perfusion defect lateral segment RIGHT middle lobe. Additional perfusion defect RIGHT lower lobe. Chest radiograph is clear at these sites. Markedly diminished perfusion at LEFT lung base corresponding to diaphragmatic hernia bowel in the inferior LEFT chest and probable small LEFT pleural effusion. No upper lobe perfusion defects. IMPRESSION: Segmental perfusion defects in RIGHT middle lobe and RIGHT lower lobe, which appear on chest radiograph. Findings are consistent with pulmonary emboli. However, patient has prior CTA exam demonstrated large saddle embolus at the RIGHT pulmonary artery extending into RIGHT middle and RIGHT lower lobes, uncertain if these represent acute or chronic pulmonary emboli. Electronically Signed: By: MLavonia DanaM.D. On: 05/03/2022 20:53   DG Chest Port 1 View  Result Date: 05/03/2022 CLINICAL DATA:  Short of breath EXAM: PORTABLE CHEST 1 VIEW COMPARISON:  Chest x-ray 05/02/2022, CT 04/30/2022, 03/05/2019 FINDINGS: Right infrahilar nodule as seen on prior CT. Mild cardiomegaly with aortic atherosclerosis. Chronic left basilar opacity corresponding to atelectasis and hernia seen on CT. IMPRESSION: No active disease. Chronic left basilar opacity corresponding to atelectasis and hernia seen on CT. Right infrahilar lung nodule, refer to chest CT 04/30/2022.  Electronically Signed   By: KDonavan FoilM.D.   On: 05/03/2022 16:52     LOS: 4 days   RAntonieta Pert MD [facility]  05/05/2022, 12:10 PM

## 2022-05-05 NOTE — Progress Notes (Signed)
ANTICOAGULATION CONSULT NOTE - Follow Up Consult  Pharmacy Consult for Heparin Indication: pulmonary embolus (PTA apixaban)   Allergies  Allergen Reactions   Fish Allergy Hives, Nausea And Vomiting, Swelling and Other (See Comments)    Redness    Diclofenac Other (See Comments)    Not allergic per caregiver    Ibuprofen Other (See Comments)    Affects kidney function   Sulfasalazine Other (See Comments)    Unknown    Sulfa Antibiotics     Unknown     Patient Measurements: Height: 5' 2.99" (160 cm) Weight: 46.4 kg (102 lb 4.7 oz) IBW/kg (Calculated) : 52.38 Heparin Dosing Weight: TBW  Vital Signs: Temp: 98.3 F (36.8 C) (01/24 0404) Temp Source: Oral (01/24 0404) BP: 169/77 (01/24 0634) Pulse Rate: 60 (01/24 0634)  Labs: Recent Labs    05/03/22 0406 05/03/22 1039 05/03/22 2032 05/04/22 0605 05/04/22 1003 05/04/22 1355 05/05/22 0356  HGB  --    < > 9.9* 11.1*  --   --  11.5*  HCT  --   --  31.8* 33.5*  --   --  35.4*  PLT  --   --  177 208  --   --  227  HEPARINUNFRC  --    < > 0.22* 0.46  --  0.48 0.50  CREATININE 1.17*  --   --   --  1.09*  --   --    < > = values in this interval not displayed.     Estimated Creatinine Clearance: 21.6 mL/min (A) (by C-G formula based on SCr of 1.09 mg/dL (H)).   Assessment: 33 yoF admitted 1/19 pm s/p fall with acute respiratory failure with hypoxia. PMH significant for PE (11/2019) and is on Apixaban PTA. D-Dimer elevated 10.38, Baseline INR 1, heparin level < 0.1 units/mL, CBC WNL. 1/22 VQ Scan positive for perfusion defects in RML, RLL. Unclear if acute or chronic PE.  PTA Apixaban 2.5 mg once daily - prescribed as 2.5 BID.  Last dose reportedly 1/19, no recent outpatient fills.  Today, 05/05/2022: Heparin level 0.50 units/mL, remains therapeutic on heparin infusion at 700 units/hr CBC: Hgb up to 11.5, Plt WNL No bleeding or complications reported.  Goal of Therapy:  Heparin level 0.3-0.7 units/ml Monitor platelets  by anticoagulation protocol: Yes   Plan:  Continue IV heparin infusion at 700 units/hr Daily heparin level and CBC Monitor closely for s/sx of bleeding Follow up long-term anticoagulation plans     Eudelia Bunch, Pharm.D Use secure chat for questions 05/05/2022 8:23 AM

## 2022-05-05 NOTE — Plan of Care (Signed)

## 2022-05-05 NOTE — TOC Initial Note (Addendum)
Transition of Care [facility]) - Initial/Assessment Note    Patient Details  Name: Cindy [name] MRN: [id number] Date of Birth: 02-07-1923  Transition of Care [facility]) CM/SW Contact:    Henrietta Dine, RN Phone Number: 05/05/2022, 11:57 AM  Clinical Narrative:                 Contacted/ pt's son (HCPOA) Lurlean Nanny [phone]) and notified him of PT recc SNF; he agreed and also gave verbal permission to share information w/ pt's caregiver Maurice March [phone]), and allow him to make decisions regarding pt; spoke w/ pt and Mr Jimmye Norman in room; agree to PT recc SNF and express preferences for #1 [employer] (pt's dtr is at facility), and #2 [employer] [facility]; explained SNF process to pt, son, and caregiver; they say pt has transportation and does not experience IPV, food insecurities, or problem paying utilities; she has glasses, and bilateral HA; she does not have dentures; they say pt has a walker, BSC, shower chair, and bars in shower; pt does not receive [place] services or home oxygen; pt is continent of bowel/bladder, and needs assist w/ feeding; they say she does not have problems w/ her skin other than bruising and "thin skin"; FL2 completed and sent out for offers; and await bed offers Patient Goals and CMS Choice Patient states their goals for this hospitalization and ongoing recovery are:: home          Expected Discharge Plan and Services   Discharge Planning Services: CM Consult   Living arrangements for the past 2 months: Single Family Home                                      Prior Living Arrangements/Services Living arrangements for the past 2 months: Single Family Home Lives with:: Self Patient language and need for interpreter reviewed:: Yes Do you feel safe going back to the place where you live?: Yes      Need for Family Participation in Patient Care: Yes (Comment) Care giver support system in place?: Yes (comment) Current home services: DME (walker, BSC,  shower chair) Criminal Activity/Legal Involvement Pertinent to Current Situation/Hospitalization: No - Comment as needed  Activities of Daily Living Home Assistive Devices/Equipment: Eyeglasses, Environmental consultant (specify type), Wheelchair ADL Screening (condition at time of admission) Patient's cognitive ability adequate to safely complete daily activities?: No Is the patient deaf or have difficulty hearing?: Yes Does the patient have difficulty seeing, even when wearing glasses/contacts?: No Does the patient have difficulty concentrating, remembering, or making decisions?: Yes Patient able to express need for assistance with ADLs?: Yes Does the patient have difficulty dressing or bathing?: Yes Independently performs ADLs?: No Communication: Independent Dressing (OT): Needs assistance Is this a change from baseline?: Pre-admission baseline Grooming: Needs assistance Is this a change from baseline?: Pre-admission baseline Feeding: Needs assistance Is this a change from baseline?: Pre-admission baseline Bathing: Needs assistance Is this a change from baseline?: Pre-admission baseline Toileting: Needs assistance Is this a change from baseline?: Pre-admission baseline In/Out Bed: Needs assistance Is this a change from baseline?: Pre-admission baseline Walks in Home: Needs assistance Is this a change from baseline?: Pre-admission baseline Does the patient have difficulty walking or climbing stairs?: Yes Weakness of Legs: Both Weakness of Arms/Hands: None  Permission Sought/Granted Permission sought to share information with : Case Manager, Other (comment) Geneticist, molecular)    Share Information with NAME: Lenor Coffin, RN, CM  Emotional Assessment Appearance:: Appears stated age Attitude/Demeanor/Rapport: Gracious Affect (typically observed): Accepting Orientation: : Oriented to Self, Oriented to Place, Oriented to  Time, Oriented to Situation Alcohol / Substance Use: Not  Applicable Psych Involvement: No (comment)  Admission diagnosis:  Hypoxia [R09.02] Acute respiratory failure with hypoxia ([place]) [J96.01] Fall, initial encounter [W19.XXXA] Urinary tract infection without hematuria, site unspecified [N39.0] Pneumonia of left lower lobe due to infectious organism [J18.9] Acute low back pain without sciatica, unspecified back pain laterality [M54.50] Closed fracture of second lumbar vertebra, unspecified fracture morphology, initial encounter [facility]) [S32.029A] Patient Active Problem List   Diagnosis Date Noted   Acute respiratory failure with hypoxia ([place]) 04/30/2022   Closed compression fracture of L2 vertebra ([place]) 04/30/2022   History of pulmonary embolism 04/30/2022   DNR (do not resuscitate)/DNI(Do Not Intubate) 04/30/2022   Fall at home, initial encounter 06/09/2020   Chronic kidney disease, stage 3a ([place]) 06/09/2020   Palliative care by specialist    Goals of care, counseling/discussion    General weakness    Dementia ([place]) 11/16/2019   Hypothyroidism 11/16/2019   Pancreatic mass 11/16/2019   Pain due to onychomycosis of toenails of both feet 10/03/2018   H/O total shoulder replacement, left 08/04/2018   Pressure injury of skin 06/13/2018   Constipation, chronic 06/28/2016   Eventration of left crus of diaphragm 06/27/2016   Uncontrolled hypertension 06/27/2016   CRI (chronic renal insufficiency), stage 4 (severe) ([place]) 06/14/2016   Primary hypothyroidism 06/14/2016   Osteoporosis, postmenopausal 06/14/2016   OAB (overactive bladder) 02/25/2016   Left bundle branch block 12/14/2012   Essential hypertension 12/14/2012   Mixed hyperlipidemia 12/14/2012   Pain in shoulder 11/11/2011   Melanoma of left upper arm ([place]) 10/19/2011   Lung mass 06/16/2011   Acquired deformities of toe 06/14/2011   Personal history of other diseases of the digestive system 06/07/2011   GERD without esophagitis 06/07/2011   PCP:  Michael Boston, MD Pharmacy:    [facility] [phone] AT [facility] [address] [place] [address] [place] [id number] Phone: [phone] Fax: [phone]     Social Determinants of Health (SDOH) Social History: SDOH Screenings   Food Insecurity: No Food Insecurity (05/01/2022)  Housing: Low Risk  (05/01/2022)  Transportation Needs: No Transportation Needs (05/01/2022)  Utilities: Not At Risk (05/01/2022)  Tobacco Use: Low Risk  (04/30/2022)   SDOH Interventions:     Readmission Risk Interventions     No data to display

## 2022-05-06 DIAGNOSIS — J9601 Acute respiratory failure with hypoxia: Secondary | ICD-10-CM | POA: Diagnosis not present

## 2022-05-06 LAB — CULTURE, BLOOD (ROUTINE X 2)
Culture: NO GROWTH
Culture: NO GROWTH
Special Requests: ADEQUATE
Special Requests: ADEQUATE

## 2022-05-06 LAB — CBC
HCT: 32.4 % — ABNORMAL LOW (ref 36.0–46.0)
Hemoglobin: 10.9 g/dL — ABNORMAL LOW (ref 12.0–15.0)
MCH: 32.5 pg (ref 26.0–34.0)
MCHC: 33.6 g/dL (ref 30.0–36.0)
MCV: 96.7 fL (ref 80.0–100.0)
Platelets: 250 10*3/uL (ref 150–400)
RBC: 3.35 MIL/uL — ABNORMAL LOW (ref 3.87–5.11)
RDW: 13.7 % (ref 11.5–15.5)
WBC: 7.8 10*3/uL (ref 4.0–10.5)
nRBC: 0 % (ref 0.0–0.2)

## 2022-05-06 LAB — HEPARIN LEVEL (UNFRACTIONATED): Heparin Unfractionated: 0.28 IU/mL — ABNORMAL LOW (ref 0.30–0.70)

## 2022-05-06 MED ORDER — APIXABAN 5 MG PO TABS: 10.0000 mg | ORAL_TABLET | Freq: Two times a day (BID) | ORAL | 0 refills | Status: DC

## 2022-05-06 MED ORDER — APIXABAN 5 MG PO TABS: 5.0000 mg | ORAL_TABLET | Freq: Two times a day (BID) | ORAL | Status: DC

## 2022-05-06 MED ORDER — LIDOCAINE 5 % EX PTCH
1.0000 | MEDICATED_PATCH | CUTANEOUS | Status: DC
  Administered 2022-05-06: 1 via TRANSDERMAL
  Filled 2022-05-06: qty 1

## 2022-05-06 MED ORDER — HYDROCODONE-ACETAMINOPHEN 5-325 MG PO TABS: 1.0000 | ORAL_TABLET | Freq: Three times a day (TID) | ORAL | 0 refills | Status: DC | PRN

## 2022-05-06 MED ORDER — APIXABAN 5 MG PO TABS
10.0000 mg | ORAL_TABLET | Freq: Two times a day (BID) | ORAL | Status: DC
  Administered 2022-05-06: 10 mg via ORAL
  Filled 2022-05-06: qty 2

## 2022-05-06 MED ORDER — LIDOCAINE 5 % EX PTCH: 1.0000 | MEDICATED_PATCH | CUTANEOUS | 0 refills | Status: DC

## 2022-05-06 NOTE — Discharge Summary (Signed)
Physician Discharge Summary  Cindy [name] WNU:[id number] DOB: Aug 27, 1922 DOA: 04/30/2022  PCP: Michael Boston, MD  Admit date: 04/30/2022 Discharge date: 05/06/2022 Recommendations for Outpatient Follow-up:  Follow up with PCP in 1 weeks-call for appointment Please obtain BMP/CBC in one week  Discharge Dispo: SNF Discharge Condition: Stable Code Status:   Code Status: DNR Diet recommendation:  Diet Order             Diet regular Room service appropriate? Yes with Assist; Fluid consistency: Thin  Diet effective now                    Brief/Interim Summary: 89yof w/ CKD IIIa, hypertension, orthostatic hypotension, prior pulmonary embolism on Eliquis presented to ED status post fall at home. IN IH:KVQQV to be in acute hypoxic respiratory failur,acute L2 fracture on trauma CT survey.   Due to new oxygen requirement the hospitalist group was called to assess the patient for admission to the hospital. Despite serial chest imaging patient not found to have any evidence of pneumonia.  Patient was however found to have a markedly elevated D-dimer concerning for pulmonary embolism.  Patient was placed on an empiric regimen of intravenous heparin and after a discussion about diagnostic options with family VQ scan was ordered.  Concerning patient's identified L2 fracture likely suffered during the patient's witness fall this was worked up further with MRI of the lumbar spine.In the absence of significant pain kyphoplasty or neurosurgery consultation was felt not to be necessary and conservative management was pursued. TSLO brace ordered along w/ PTOT.  PT OT recommending skilled nursing facility. Patient will be able to go to skilled nursing facility 1/25.   Discharge Diagnoses:  Principal Problem:   Acute respiratory failure with hypoxia ([place]) Active Problems:   Fall at home, initial encounter   Closed compression fracture of L2 vertebra ([facility])   Chronic kidney disease, stage 3a ([place])   GERD  without esophagitis   Mixed hyperlipidemia   DNR (do not resuscitate)/DNI(Do Not Intubate)   Essential hypertension   History of pulmonary embolism  Acute respiratory failure with hypoxia VQ scan positive for PE  Elevated D-dimer at 10. History of PE on Eliquis: VQ scan resulted 1/22 night with segmental perfusion defect in the right middle lobe and right lower lobe consistent with pulmonary emboli, however patient is prior CTA with large saddle embolus and right pulmonary artery extending into the right middle and right lower lobe uncertain if this represents acute on chronic pulmonary emboli but given her presentation and hypoxia suspecting acute PE, son feels the same. Again discussed with patient's son today, given patient's overall age and borderline renal functions CT scan was not pursued.he is agreeable with continuing full dose Eliquis for 7 days then 5 mg twice daily.  If patient unable to tolerate anticoagulation in future may be need to look into alternative regimen versus palliative care goals of care discussion.He feels patient will do better at home with caregiver 24/7.Patient was taking low-dose of Eliquis PTA and per caregiver reportedly due to having some bruising in the skin. Heparin drip switched to Eliquis  Fall at home:PT OT elevation and for SNF. L2 fracture on CT/MRI:MRI acute or subacute inferior vertebral body compression fracture at L2. In the absence of significant pain kyphoplasty or neurosurgery consultation was felt not to be necessary and conservative management was pursued. TSLO brace ordered along w/ PTOT >does not complain of back pain currently- although some pain during transfer,added lidocaine patch, continue hydrocodone (  needed only once) and seems to be helping well. Continue to mobilize w/ PT OT. F/u with outpatient neurosurgery if having uncontrolled pain. Updated her son 05/05/22 and is agreeable with current plan with conservative management  CKD stage IIIa:  Renal function is stable Recent Labs    05/01/22 0058 05/01/22 2131 05/02/22 0102 05/03/22 0406 05/04/22 1003  BUN 29* 32* 33* 34* 31*  CREATININE 1.25* 1.35* 1.32* 1.17* 1.09*   Cognitive decline recently: Continue supportive care Choking episode speech consulted, has seen and now on diet Abdominal pain-resolved.LFTs lipase stable GERD: Continue PPI HLD: Continue Zocor 40 mg HTN: BP stable. At times uncontrolled and started hydralazine 25 mg prn.caregiver informs that patient has had blood pressure issues with antihypertensive so holding off, continue pain control if needed   Deconditioning/debility she will be discharged to skilled nursing facility.  GOC: DNR.  Discussed with patient's son extensively over the phone- patient may benefit with palliative care evaluation at the SNF/outpatient, he is agreeable with that our social worker to refer to outpatient palliative care given her complex comorbidities advanced age and cognitive decline> if declines further may be appropriate for hospice at home- son understands and open to the idea.  Patient is definitely high risk for readmissions and decompensation  Pressure injury of coccyx POA.  Pressure Injury 04/30/22 Coccyx Right;Left;Medial Deep Tissue Pressure Injury - Purple or maroon localized area of discolored intact skin or blood-filled blister due to damage of underlying soft tissue from pressure and/or shear. (Active)  04/30/22 2345  Location: Coccyx  Location Orientation: Right;Left;Medial  Staging: Deep Tissue Pressure Injury - Purple or maroon localized area of discolored intact skin or blood-filled blister due to damage of underlying soft tissue from pressure and/or shear.  Wound Description (Comments):   Present on Admission: Yes  Dressing Type Foam - Lift dressing to assess site every shift 05/03/22 0816   Consults: PTOT Subjective: Alert awake pleasant, resting comfortably on the bedside chair denies any back  pain  Discharge Exam: Vitals:   05/05/22 1925 05/06/22 0412  BP: (!) 148/65 (!) 162/77  Pulse: 75 62  Resp: 16 16  Temp: 98.8 F (37.1 C) 98.6 F (37 C)  SpO2: 92% 93%   General: Pt is alert, awake, unable  elderly frail,not in acute distress Cardiovascular: RRR, S1/S2 +, no rubs, no gallops Respiratory: CTA bilaterally, no wheezing, no rhonchi Abdominal: Soft, NT, ND, bowel sounds + Extremities: no edema, no cyanosis  Discharge Instructions  Discharge Instructions     Discharge instructions   Complete by: As directed    Please call [place] [facility] neuro[facility] office for outpatient follow-up for L1 fracture  Please call call MD or return to ER for similar or worsening recurring problem that brought you to hospital or if any fever,nausea/vomiting,abdominal pain, uncontrolled pain, chest pain,  shortness of breath or any other alarming symptoms.  Please follow-up your doctor as instructed in a week time and call the office for appointment.  Please avoid alcohol, smoking, or any other illicit substance and maintain healthy habits including taking your regular medications as prescribed.  You were cared for by a hospitalist during your hospital stay. If you have any questions about your discharge medications or the care you received while you were in the hospital after you are discharged, you can call the unit and ask to speak with the hospitalist on call if the hospitalist that took care of you is not available.  Once you are discharged, your primary care physician will handle  any further medical issues. Please note that NO REFILLS for any discharge medications will be authorized once you are discharged, as it is imperative that you return to your primary care physician (or establish a relationship with a primary care physician if you do not have one) for your aftercare needs so that they can reassess your need for medications and monitor your lab values   Discharge wound care:   Complete  by: As directed    Continue offloading, dressing on coccyx   Increase activity slowly   Complete by: As directed       Allergies as of 05/06/2022       Reactions   Fish Allergy Hives, Nausea And Vomiting, Swelling, Other (See Comments)   Redness    Diclofenac Other (See Comments)   Not allergic per caregiver    Ibuprofen Other (See Comments)   Affects kidney function   Sulfasalazine Other (See Comments)   Unknown    Sulfa Antibiotics    Unknown         Medication List     STOP taking these medications    traMADol 50 MG tablet Commonly known as: ULTRAM       TAKE these medications    acetaminophen 325 MG tablet Commonly known as: TYLENOL Take 1-2 tablets (325-650 mg total) by mouth every 6 (six) hours as needed for mild pain or moderate pain (pain score 1-3 or temp > 100.5). What changed:  how much to take when to take this   apixaban 5 MG Tabs tablet Commonly known as: ELIQUIS Take 2 tablets (10 mg total) by mouth 2 (two) times daily for 9 doses. What changed:  medication strength how much to take   apixaban 5 MG Tabs tablet Commonly known as: ELIQUIS Take 1 tablet (5 mg total) by mouth 2 (two) times daily. Start taking on: May 13, 2022 What changed: You were already taking a medication with the same name, and this prescription was added. Make sure you understand how and when to take each.   B COMPLEX 100 PO Take 1 tablet by mouth daily.   CRANBERRY PO Take 1 tablet by mouth daily.   dextromethorphan-guaiFENesin 30-600 MG 12hr tablet Commonly known as: MUCINEX DM Take 1 tablet by mouth daily.   HYDROcodone-acetaminophen 5-325 MG tablet Commonly known as: NORCO/VICODIN Take 1 tablet by mouth every 8 (eight) hours as needed for up to 3 doses for moderate pain or severe pain.   hydroxypropyl methylcellulose / hypromellose 2.5 % ophthalmic solution Commonly known as: ISOPTO TEARS / GONIOVISC Place 1 drop into both eyes daily.   levothyroxine  75 MCG tablet Commonly known as: SYNTHROID Take 75 mcg by mouth daily before breakfast.   lidocaine 5 % Commonly known as: LIDODERM Place 1 patch onto the skin daily. Remove & Discard patch within 12 hours or as directed by MD Start taking on: May 07, 2022   Ocuvite Extra Tabs Take 1 tablet by mouth daily.   omeprazole 20 MG capsule Commonly known as: PRILOSEC Take 20 mg by mouth daily.   simvastatin 20 MG tablet Commonly known as: ZOCOR Take 1 tablet (20 mg total) by mouth every evening.   Triamcinolone Acetonide 0.025 % Lotn Apply 1 application  topically as needed (itching).               Discharge Care Instructions  (From admission, onward)           Start     Ordered   05/06/22  0000  Discharge wound care:       Comments: Continue offloading, dressing on coccyx   05/06/22 1034            Follow-up Information     Michael Boston, MD Follow up in 1 week(s).   Specialty: Internal Medicine Contact information: [place] [address] [phone]         [place], [facility]. Schedule an appointment as soon as possible for a visit in 1 week(s).   Specialty: Neurosurgery Contact information: [address] [place] [address] [phone]                Allergies  Allergen Reactions   Fish Allergy Hives, Nausea And Vomiting, Swelling and Other (See Comments)    Redness    Diclofenac Other (See Comments)    Not allergic per caregiver    Ibuprofen Other (See Comments)    Affects kidney function   Sulfasalazine Other (See Comments)    Unknown    Sulfa Antibiotics     Unknown     The results of significant diagnostics from this hospitalization (including imaging, microbiology, ancillary and laboratory) are listed below for reference.    Microbiology: Recent Results (from the past 240 hour(s))  Resp panel by RT-PCR (RSV, Flu A&B, Covid) Anterior Nasal Swab     Status: None    Collection Time: 04/30/22  6:59 PM   Specimen: Anterior Nasal Swab  Result Value Ref Range Status   SARS Coronavirus 2 by RT PCR NEGATIVE NEGATIVE Final    Comment: (NOTE) SARS-CoV-2 target nucleic acids are NOT DETECTED.  The SARS-CoV-2 RNA is generally detectable in upper respiratory specimens during the acute phase of infection. The lowest concentration of SARS-CoV-2 viral copies this assay can detect is 138 copies/mL. A negative result does not preclude SARS-Cov-2 infection and should not be used as the sole basis for treatment or other patient management decisions. A negative result may occur with  improper specimen collection/handling, submission of specimen other than nasopharyngeal swab, presence of viral mutation(s) within the areas targeted by this assay, and inadequate number of viral copies(<138 copies/mL). A negative result must be combined with clinical observations, patient history, and epidemiological information. The expected result is Negative.  Fact Sheet for Patients:  EntrepreneurPulse.com.au  Fact Sheet for Healthcare Providers:  IncredibleEmployment.be  This test is no t yet approved or cleared by the [place] FDA and  has been authorized for detection and/or diagnosis of SARS-CoV-2 by FDA under an Emergency Use Authorization (EUA). This EUA will remain  in effect (meaning this test can be used) for the duration of the COVID-19 declaration under Section 564(b)(1) of the Act, 21 U.S.C.section 360bbb-3(b)(1), unless the authorization is terminated  or revoked sooner.       Influenza A by PCR NEGATIVE NEGATIVE Final   Influenza B by PCR NEGATIVE NEGATIVE Final    Comment: (NOTE) The Xpert Xpress SARS-CoV-2/FLU/RSV plus assay is intended as an aid in the diagnosis of influenza from Nasopharyngeal swab specimens and should not be used as a sole basis for treatment. Nasal washings and aspirates are unacceptable for  Xpert Xpress SARS-CoV-2/FLU/RSV testing.  Fact Sheet for Patients: EntrepreneurPulse.com.au  Fact Sheet for Healthcare Providers: IncredibleEmployment.be  This test is not yet approved or cleared by the [place] FDA and has been authorized for detection and/or diagnosis of SARS-CoV-2 by FDA under an Emergency Use Authorization (EUA). This EUA will remain in effect (  meaning this test can be used) for the duration of the COVID-19 declaration under Section 564(b)(1) of the Act, 21 U.S.C. section 360bbb-3(b)(1), unless the authorization is terminated or revoked.     Resp Syncytial Virus by PCR NEGATIVE NEGATIVE Final    Comment: (NOTE) Fact Sheet for Patients: EntrepreneurPulse.com.au  Fact Sheet for Healthcare Providers: IncredibleEmployment.be  This test is not yet approved or cleared by the [place] FDA and has been authorized for detection and/or diagnosis of SARS-CoV-2 by FDA under an Emergency Use Authorization (EUA). This EUA will remain in effect (meaning this test can be used) for the duration of the COVID-19 declaration under Section 564(b)(1) of the Act, 21 U.S.C. section 360bbb-3(b)(1), unless the authorization is terminated or revoked.  Performed at [facility], [place] [address] [place], [place] [address]   Culture, blood (Routine X 2) w Reflex to ID Panel     Status: None   Collection Time: 05/01/22 12:01 PM   Specimen: BLOOD  Result Value Ref Range Status   Specimen Description BLOOD LEFT ANTECUBITAL  Final   Special Requests   Final    BOTTLES DRAWN AEROBIC ONLY Blood Culture adequate volume   Culture   Final    NO GROWTH 5 DAYS Performed at [facility], [place] [address] [place],  [address]    Report Status 05/06/2022 FINAL  Final  Culture, blood (Routine X 2) w Reflex to ID Panel     Status: None   Collection Time: 05/01/22 12:01 PM    Specimen: BLOOD LEFT HAND  Result Value Ref Range Status   Specimen Description BLOOD LEFT HAND  Final   Special Requests   Final    BOTTLES DRAWN AEROBIC ONLY Blood Culture adequate volume   Culture   Final    NO GROWTH 5 DAYS Performed at [facility], [place] [address] [place],  [address]    Report Status 05/06/2022 FINAL  Final    Procedures/Studies: NM Pulmonary Perfusion  Addendum Date: 05/03/2022   ADDENDUM REPORT: 05/03/2022 21:12 ADDENDUM: Findings called to Gershon Cull on 05/03/2022 at 0911 hours. Electronically Signed   By: Lavonia Dana M.D.   On: 05/03/2022 21:12   Result Date: 05/03/2022 CLINICAL DATA:  Shortness of breath, elevated D-dimer, history of pulmonary embolism, fell at home on 04/30/2022 EXAM: NUCLEAR MEDICINE PERFUSION LUNG SCAN TECHNIQUE: Perfusion images were obtained in multiple projections after intravenous injection of radiopharmaceutical. Ventilation scans intentionally deferred if perfusion scan and chest x-ray adequate for interpretation during COVID 19 epidemic. RADIOPHARMACEUTICALS:  3.78 mCi Tc-42mMAA IV COMPARISON:  Chest radiograph 05/03/2022; CT angio chest 11/16/2019 FINDINGS: Segmental perfusion defect lateral segment RIGHT middle lobe. Additional perfusion defect RIGHT lower lobe. Chest radiograph is clear at these sites. Markedly diminished perfusion at LEFT lung base corresponding to diaphragmatic hernia bowel in the inferior LEFT chest and probable small LEFT pleural effusion. No upper lobe perfusion defects. IMPRESSION: Segmental perfusion defects in RIGHT middle lobe and RIGHT lower lobe, which appear on chest radiograph. Findings are consistent with pulmonary emboli. However, patient has prior CTA exam demonstrated large saddle embolus at the RIGHT pulmonary artery extending into RIGHT middle and RIGHT lower lobes, uncertain if these represent acute or chronic pulmonary emboli. Electronically Signed: By: MLavonia DanaM.D. On: 05/03/2022  20:53   DG Chest Port 1 View  Result Date: 05/03/2022 CLINICAL DATA:  Short of breath EXAM: PORTABLE CHEST 1 VIEW COMPARISON:  Chest x-ray 05/02/2022, CT 04/30/2022, 03/05/2019 FINDINGS:  Right infrahilar nodule as seen on prior CT. Mild cardiomegaly with aortic atherosclerosis. Chronic left basilar opacity corresponding to atelectasis and hernia seen on CT. IMPRESSION: No active disease. Chronic left basilar opacity corresponding to atelectasis and hernia seen on CT. Right infrahilar lung nodule, refer to chest CT 04/30/2022. Electronically Signed   By: Donavan Foil M.D.   On: 05/03/2022 16:52   DG Chest 1 View  Result Date: 05/02/2022 CLINICAL DATA:  Possible pneumonia. EXAM: CHEST  1 VIEW COMPARISON:  06/09/2020, chest CT 04/30/2022 FINDINGS: Patient is rotated to the left. Lungs are adequately inflated and demonstrate opacification of the left base/retrocardiac region compatible with recent CT findings of diaphragmatic hernia with moderate herniation of the stomach into this region. There is adjacent atelectasis in the left lung base. Right lung is clear. Cardiomediastinal silhouette and remainder of the exam is unchanged. IMPRESSION: Opacification of the left base/retrocardiac region compatible with recent CT findings of diaphragmatic hernia with moderate herniation of the stomach into this region. Adjacent atelectasis in the left lung base. Electronically Signed   By: Marin Olp M.D.   On: 05/02/2022 09:59   MR LUMBAR SPINE WO CONTRAST  Result Date: 05/01/2022 CLINICAL DATA:  Compression fracture L2 vertebra seen by CT. Assess acuity. EXAM: MRI LUMBAR SPINE WITHOUT CONTRAST TECHNIQUE: Multiplanar, multisequence MR imaging of the lumbar spine was performed. No intravenous contrast was administered. COMPARISON:  04/30/2022 FINDINGS: Segmentation: 5 lumbar type vertebral bodies as numbered previously. Alignment: 4 mm degenerative anterolisthesis L3-4. 10 mm degenerative anterolisthesis L4-5. 6 mm  degenerative anterolisthesis L5-S1. Vertebrae: Acute or subacute inferior vertebral body compression fracture at L2 with marrow edema. No other regional injury. Conus medullaris and cauda equina: Conus extends to the L1-2 level. Conus and cauda equina appear normal. Paraspinal and other soft tissues: Negative Disc levels: Mild non-compressive disc bulges from T12-L1 through L2-3. L3-4: Facet arthropathy with 4 mm of anterolisthesis. Bulging of the disc. Stenosis of the lateral recesses right more than left. Definite neural compression is not established. L4-5: Facet arthropathy with 1 cm of anterolisthesis. Bulging of the disc. Severe multifactorial stenosis that could cause neural compression on either or both sides. L5-S1: Facet arthropathy with 6 mm of anterolisthesis. No compressive canal or foraminal narrowing. IMPRESSION: 1. Acute or subacute inferior vertebral body compression fracture at L2 with marrow edema. No retropulsed bone. This could be a cause of regional pain. This looks like a benign fracture. 2. L3-4: Facet arthropathy with 4 mm of anterolisthesis. Bulging of the disc. Mild stenosis of the lateral recesses right more than left. Definite neural compression is not established. 3. L4-5: Facet arthropathy with 1 cm of anterolisthesis. Bulging of the disc. Severe multifactorial stenosis that could cause neural compression on either or both sides. 4. L5-S1: Facet arthropathy with 6 mm of anterolisthesis. No compressive stenosis. Electronically Signed   By: Nelson Chimes M.D.   On: 05/01/2022 11:47   CT Lumbar Spine Wo Contrast  Result Date: 04/30/2022 CLINICAL DATA:  pain, fall EXAM: CT LUMBAR SPINE WITHOUT CONTRAST TECHNIQUE: Multidetector CT imaging of the lumbar spine was performed without intravenous contrast administration. Multiplanar CT image reconstructions were also generated. RADIATION DOSE REDUCTION: This exam was performed according to the departmental dose-optimization program which  includes automated exposure control, adjustment of the mA and/or kV according to patient size and/or use of iterative reconstruction technique. COMPARISON:  CT renal protocol from February 18, 2019 FINDINGS: Segmentation: 5 non rib-bearing lumbar vertebral bodies. Alignment: Grade 2 anterolisthesis of L4 on  L5 and grade 1 anterolisthesis of L3 on L4. Vertebrae: New linear sclerosis through the L2 vertebral body with cortical buckling anteriorly (for example see series 6, image 25). Osteopenia. Paraspinal and other soft tissues: Aorta bi-iliac calcific atherosclerosis. Disc levels: Severe facet arthropathy in lower lumbar spine with grade 2 anterolisthesis of L4 on L5 and grade 1 anterolisthesis of L3 on L4. Resulting probable canal and foraminal stenosis bilaterally at L4-L5. IMPRESSION: 1. New linear sclerosis through the L2 vertebral body with cortical buckling anteriorly, suspicious for acute fracture in the setting of known trauma. An MRI could assess for bone marrow edema if clinically warranted. 2. Severe facet arthropathy in lower lumbar spine with grade 2 anterolisthesis of L4 on L5 and grade 1 anterolisthesis of L3 on L4. Resulting probable canal and foraminal stenosis bilaterally at L4-L5. MRI could further characterize if clinically warranted. 3. Osteopenia. Electronically Signed   By: Margaretha Sheffield M.D.   On: 04/30/2022 18:25   CT PELVIS WO CONTRAST  Result Date: 04/30/2022 CLINICAL DATA:  Pain, fall. EXAM: CT PELVIS WITHOUT CONTRAST TECHNIQUE: Multidetector CT imaging of the pelvis was performed following the standard protocol without intravenous contrast. RADIATION DOSE REDUCTION: This exam was performed according to the departmental dose-optimization program which includes automated exposure control, adjustment of the mA and/or kV according to patient size and/or use of iterative reconstruction technique. COMPARISON:  Pelvis CT 06/09/2020 FINDINGS: Urinary Tract:  No abnormality visualized.  Bowel: There is sigmoid colon diverticulosis. No acute inflammation. No dilated bowel loops. Vascular/Lymphatic: Are atherosclerotic calcifications of the aorta and iliac arteries. No enlarged lymph nodes are seen. Reproductive:  Uterus is surgically absent. Other: There is no ascites. There are small fat containing inguinal hernias. Musculoskeletal: No focal soft tissue hematoma or fluid collection identified. The bones are diffusely osteopenic which limits evaluation for subtle nondisplaced fracture. No acute fracture or focal osseous lesion identified. No dislocation. There is grade 1 anterolisthesis at L4-L5. Please see dedicated lumbar spine CT for further description. There are moderate degenerative changes of the pubic symphysis and bilateral hips. There are mild degenerative changes of the sacroiliac joints. IMPRESSION: 1. Limited by osteopenia. No acute fracture or dislocation of the pelvis. 2. Sigmoid colon diverticulosis. Aortic Atherosclerosis (ICD10-I70.0). Electronically Signed   By: Ronney Asters M.D.   On: 04/30/2022 18:21   CT Chest Wo Contrast  Result Date: 04/30/2022 CLINICAL DATA:  Fall.  Pain.  Hypoxia. EXAM: CT CHEST WITHOUT CONTRAST TECHNIQUE: Multidetector CT imaging of the chest was performed following the standard protocol without IV contrast. RADIATION DOSE REDUCTION: This exam was performed according to the departmental dose-optimization program which includes automated exposure control, adjustment of the mA and/or kV according to patient size and/or use of iterative reconstruction technique. COMPARISON:  CT 01/04/2020 and older.  X-ray 06/09/2020 and older. FINDINGS: Cardiovascular: Normal caliber thoracic aorta with diffuse scattered calcified plaque on this non IV contrast exam. Coronary artery calcifications are seen. The heart is nonenlarged. Trace pericardial fluid or thickening. There is some enlargement of the main pulmonary arteries. Please correlate for any history of  pulmonary artery hypertension. Mediastinum/Nodes: Anasarca. No specific abnormal lymph node enlargement present in the axillary region on this noncontrast exam. No definite hilar nodes. There are some enlarged mediastinal nodes including precarinal on series 3, image 60 measuring 19 by 17 mm. On the prior exam this was smaller measuring 11 by 9 mm. Additional enlarging nodes identified elsewhere paratracheal. Large hiatal hernia identified extending into the posterior left hemithorax. Slightly patulous  esophagus above this. There is also focal diaphragmatic defect noted best seen on coronal image 42 of series 7 with herniation of some fat. Lungs/Pleura: Focal noncalcified nodule identified along the posterosuperior aspect of the middle lobe. On the prior this measured 14 mm in dimension and today 14 mm again on series 6, image 79. Please correlate prior workup. This has been stable for over 2 years. Areas of bronchiectasis with wall thickening and mild linear opacity along the right lower lobe is again noted. Slightly more prominent today. There is some similar areas in the posterior right upper lobe. Scattered bilateral areas of interstitial septal thickening and pleural thickening. Slightly more confluent opacity left lower lobe this could be compressive atelectasis related to the adjacent hiatal hernia. This is unchanged from previous. No pneumothorax. Upper Abdomen: Adrenal glands are preserved in the upper abdomen Musculoskeletal: Scattered degenerative changes of the spine with osteopenia. There is streak artifact related to the patient's left shoulder arthroplasty. Old left-sided rib fractures. Advanced degenerative changes of the right shoulder. IMPRESSION: Once again large hiatal hernia with a patulous esophagus. Separate focal diaphragmatic defect more left lateral. Adjacent parenchymal opacity left lung base. Favor compressive atelectasis over infiltrate but correlate with symptoms. Stable 14 mm middle  lobe noncalcified nodule which has been stable for least 2 years. Enlarging mediastinal lymph nodes not pathologic in the precarinal space. Please correlate for any known history otherwise a follow-up contrast study may be useful to further delineate when clinically appropriate Aortic Atherosclerosis (ICD10-I70.0) and Emphysema (ICD10-J43.9). Electronically Signed   By: Jill Side M.D.   On: 04/30/2022 18:15    Labs: BNP (last 3 results) No results for input(s): "BNP" in the last 8760 hours. Basic Metabolic Panel: Recent Labs  Lab 05/01/22 0058 05/01/22 2131 05/02/22 0102 05/03/22 0406 05/04/22 1003  NA 137 137 139 137 136  K 3.8 4.0 3.7 3.6 3.5  CL 111 108 108 107 104  CO2 20* 17* 22 21* 22  GLUCOSE 113* 99 98 105* 112*  BUN 29* 32* 33* 34* 31*  CREATININE 1.25* 1.35* 1.32* 1.17* 1.09*  CALCIUM 8.5* 8.6* 9.0 9.0 8.7*  MG  --   --  2.0 2.2  --    Liver Function Tests: Recent Labs  Lab 05/01/22 0058 05/01/22 2131 05/02/22 0102 05/03/22 0406 05/04/22 1003  AST '22 24 18 17 18  '$ ALT '10 11 12 11 9  '$ ALKPHOS 42 44 43 45 44  BILITOT 0.5 0.8 0.8 0.9 0.6  PROT 6.7 7.1 7.2 7.1 7.1  ALBUMIN 3.5 3.5 3.7 3.5 3.5   Recent Labs  Lab 05/04/22 1003  LIPASE 40   No results for input(s): "AMMONIA" in the last 168 hours. CBC: Recent Labs  Lab 04/30/22 2009 05/01/22 0058 05/02/22 0102 05/03/22 2032 05/04/22 0605 05/05/22 0356 05/06/22 0350  WBC 13.1* 10.7* 9.7 7.3 7.6 7.6 7.8  NEUTROABS 11.1* 8.5* 7.4  --   --   --   --   HGB 12.9 12.2 12.2 9.9* 11.1* 11.5* 10.9*  HCT 39.8 37.1 37.0 31.8* 33.5* 35.4* 32.4*  MCV 97.3 97.9 98.4 102.9* 96.8 98.6 96.7  PLT 230 208 205 177 208 227 250   Cardiac Enzymes: No results for input(s): "CKTOTAL", "CKMB", "CKMBINDEX", "TROPONINI" in the last 168 hours. BNP: Invalid input(s): "POCBNP" CBG: Recent Labs  Lab 05/03/22 0010 05/03/22 0412 05/03/22 0746 05/03/22 1112 05/03/22 2011  GLUCAP 123* 104* 99 130* 137*   D-Dimer No results  for input(s): "DDIMER" in the last 72  hours. Hgb A1c No results for input(s): "HGBA1C" in the last 72 hours. Lipid Profile No results for input(s): "CHOL", "HDL", "LDLCALC", "TRIG", "CHOLHDL", "LDLDIRECT" in the last 72 hours. Thyroid function studies No results for input(s): "TSH", "T4TOTAL", "T3FREE", "THYROIDAB" in the last 72 hours.  Invalid input(s): "FREET3" Anemia work up No results for input(s): "VITAMINB12", "FOLATE", "FERRITIN", "TIBC", "IRON", "RETICCTPCT" in the last 72 hours. Urinalysis    Component Value Date/Time   COLORURINE YELLOW 04/30/2022 2021   APPEARANCEUR CLEAR 04/30/2022 2021   LABSPEC 1.020 04/30/2022 2021   PHURINE 5.0 04/30/2022 2021   GLUCOSEU NEGATIVE 04/30/2022 2021   HGBUR MODERATE (A) 04/30/2022 2021   BILIRUBINUR NEGATIVE 04/30/2022 2021   KETONESUR 5 (A) 04/30/2022 2021   PROTEINUR 100 (A) 04/30/2022 2021   UROBILINOGEN 0.2 11/22/2013 2218   NITRITE POSITIVE (A) 04/30/2022 2021   LEUKOCYTESUR SMALL (A) 04/30/2022 2021   Sepsis Labs Recent Labs  Lab 05/03/22 2032 05/04/22 0605 05/05/22 0356 05/06/22 0350  WBC 7.3 7.6 7.6 7.8   Microbiology Recent Results (from the past 240 hour(s))  Resp panel by RT-PCR (RSV, Flu A&B, Covid) Anterior Nasal Swab     Status: None   Collection Time: 04/30/22  6:59 PM   Specimen: Anterior Nasal Swab  Result Value Ref Range Status   SARS Coronavirus 2 by RT PCR NEGATIVE NEGATIVE Final    Comment: (NOTE) SARS-CoV-2 target nucleic acids are NOT DETECTED.  The SARS-CoV-2 RNA is generally detectable in upper respiratory specimens during the acute phase of infection. The lowest concentration of SARS-CoV-2 viral copies this assay can detect is 138 copies/mL. A negative result does not preclude SARS-Cov-2 infection and should not be used as the sole basis for treatment or other patient management decisions. A negative result may occur with  improper specimen collection/handling, submission of specimen  other than nasopharyngeal swab, presence of viral mutation(s) within the areas targeted by this assay, and inadequate number of viral copies(<138 copies/mL). A negative result must be combined with clinical observations, patient history, and epidemiological information. The expected result is Negative.  Fact Sheet for Patients:  EntrepreneurPulse.com.au  Fact Sheet for Healthcare Providers:  IncredibleEmployment.be  This test is no t yet approved or cleared by the [place] FDA and  has been authorized for detection and/or diagnosis of SARS-CoV-2 by FDA under an Emergency Use Authorization (EUA). This EUA will remain  in effect (meaning this test can be used) for the duration of the COVID-19 declaration under Section 564(b)(1) of the Act, 21 U.S.C.section 360bbb-3(b)(1), unless the authorization is terminated  or revoked sooner.       Influenza A by PCR NEGATIVE NEGATIVE Final   Influenza B by PCR NEGATIVE NEGATIVE Final    Comment: (NOTE) The Xpert Xpress SARS-CoV-2/FLU/RSV plus assay is intended as an aid in the diagnosis of influenza from Nasopharyngeal swab specimens and should not be used as a sole basis for treatment. Nasal washings and aspirates are unacceptable for Xpert Xpress SARS-CoV-2/FLU/RSV testing.  Fact Sheet for Patients: EntrepreneurPulse.com.au  Fact Sheet for Healthcare Providers: IncredibleEmployment.be  This test is not yet approved or cleared by the [place] FDA and has been authorized for detection and/or diagnosis of SARS-CoV-2 by FDA under an Emergency Use Authorization (EUA). This EUA will remain in effect (meaning this test can be used) for the duration of the COVID-19 declaration under Section 564(b)(1) of the Act, 21 U.S.C. section 360bbb-3(b)(1), unless the authorization is terminated or revoked.     Resp Syncytial Virus by  PCR NEGATIVE NEGATIVE Final     Comment: (NOTE) Fact Sheet for Patients: EntrepreneurPulse.com.au  Fact Sheet for Healthcare Providers: IncredibleEmployment.be  This test is not yet approved or cleared by the [place] FDA and has been authorized for detection and/or diagnosis of SARS-CoV-2 by FDA under an Emergency Use Authorization (EUA). This EUA will remain in effect (meaning this test can be used) for the duration of the COVID-19 declaration under Section 564(b)(1) of the Act, 21 U.S.C. section 360bbb-3(b)(1), unless the authorization is terminated or revoked.  Performed at [facility], [place] [address], [place], [place] [address]   Culture, blood (Routine X 2) w Reflex to ID Panel     Status: None   Collection Time: 05/01/22 12:01 PM   Specimen: BLOOD  Result Value Ref Range Status   Specimen Description BLOOD LEFT ANTECUBITAL  Final   Special Requests   Final    BOTTLES DRAWN AEROBIC ONLY Blood Culture adequate volume   Culture   Final    NO GROWTH 5 DAYS Performed at [facility], [place] [address], [place], [place] [address]    Report Status 05/06/2022 FINAL  Final  Culture, blood (Routine X 2) w Reflex to ID Panel     Status: None   Collection Time: 05/01/22 12:01 PM   Specimen: BLOOD LEFT HAND  Result Value Ref Range Status   Specimen Description BLOOD LEFT HAND  Final   Special Requests   Final    BOTTLES DRAWN AEROBIC ONLY Blood Culture adequate volume   Culture   Final    NO GROWTH 5 DAYS Performed at [facility], [place] [address], [place], [place] [address]    Report Status 05/06/2022 FINAL  Final  Time coordinating discharge: 35 minutes  SIGNED: Antonieta Pert, MD  [facility] 05/06/2022, 11:53 AM  If 7PM-7AM, please contact night-coverage [email or web address]

## 2022-05-06 NOTE — Progress Notes (Signed)
[facility] liaison note  Notified by [facility] manager of patient/family request for [facility] Palliative services at home after discharge.  [facility] will follow patient for discharge disposition.  Please call with any hospice or outpatient palliative care related questions.   Thank you for the opportunity to participate in this patient's care.  [place]  [facility] liaison  [phone]

## 2022-05-06 NOTE — Evaluation (Signed)
Occupational Therapy Evaluation Patient Details Name: Cindy [name] MRN: [id number] DOB: [birth year] Today's Date: 05/06/2022   History of Present Illness Pt is a 89 yo female admitted with acute respiratory failure with hypoxia. In BO:FBPZW to be in acute hypoxic respiratory failure, acute L2 fracture on trauma CT survey. 1/22: Scan identified PE. PMH: CKD IIIa, hypertension, orthostatic hypotension, prior pulmonary embolism on Eliquis   Clinical Impression   Cindy [name] is a 89 year old woman who presents with generalized weakness, decreased activity tolerance, impaired balance and pain resulting in a sudden decline in her functional abilities. At baseline she is independent with ADLS and a household Ambulator. Patient has an aide but unsure of how often aide is there or could be there. Today patient requires significant assist for bed mobility, able to tolerate donning of TLSO brace and needing max assist -for LB ADLs and toileting. She was able to take a couple of steps with walker and complained of lower extremity pain. Patient will benefit from skilled OT services while in hospital to improve deficits and learn compensatory strategies as needed in order to return to PLOF.        Recommendations for follow up therapy are one component of a multi-disciplinary discharge planning process, led by the attending physician.  Recommendations may be updated based on patient status, additional functional criteria and insurance authorization.   Follow Up Recommendations  Skilled nursing-short term rehab (<3 hours/day)     Assistance Recommended at Discharge Frequent or constant Supervision/Assistance  Patient can return home with the following A little help with walking and/or transfers;A lot of help with bathing/dressing/bathroom;Assistance with cooking/housework;Help with stairs or ramp for entrance    Functional Status Assessment  Patient has had a recent decline in their functional status  and demonstrates the ability to make significant improvements in function in a reasonable and predictable amount of time.  Equipment Recommendations  Other (comment) (TBD)    Recommendations for Other Services       Precautions / Restrictions Precautions Precautions: Fall Precaution Comments: PE, back precautions Required Braces or Orthoses: Spinal Brace Spinal Brace: Thoracolumbosacral orthotic Restrictions Weight Bearing Restrictions: No (Simultaneous filing. User may not have seen previous data.)      Mobility Bed Mobility Overal bed mobility: Needs Assistance Bed Mobility: Supine to Sit Rolling: Total assist, +2 for physical assistance         General bed mobility comments: TO reduce pain - use of "helicopter method" to transfer to edge of bed. TOlerated transfer well today without complaints of pain    Transfers Overall transfer level: Needs assistance Equipment used: Rolling walker (2 wheels) Transfers: Sit to/from Stand, Bed to chair/wheelchair/BSC Sit to Stand: Min assist, +2 safety/equipment     Step pivot transfers: Min assist     General transfer comment: Min assist to take steps with walker to recliner. Began to complain of pain in legs with steps.      Balance Overall balance assessment: Needs assistance Sitting-balance support: No upper extremity supported, Feet supported Sitting balance-Leahy Scale: Fair     Standing balance support: Reliant on assistive device for balance Standing balance-Leahy Scale: Poor                             ADL either performed or assessed with clinical judgement   ADL Overall ADL's : Needs assistance/impaired Eating/Feeding: Set up;Sitting   Grooming: Set up;Sitting   Upper Body Bathing: Minimal assistance;Sitting  Lower Body Bathing: Maximal assistance;Sit to/from stand   Upper Body Dressing : Minimal assistance;Sitting   Lower Body Dressing: Maximal assistance;Sit to/from stand   Toilet  Transfer: Minimal assistance;BSC/3in1;Rolling walker (2 wheels)   Toileting- Clothing Manipulation and Hygiene: Maximal assistance;Sit to/from stand       Functional mobility during ADLs: Minimal assistance;Rolling walker (2 wheels)       Vision Patient Visual Report: No change from baseline       Perception     Praxis      Pertinent Vitals/Pain Pain Assessment Pain Assessment: Faces Faces Pain Scale: Hurts little more Pain Location: legs Pain Descriptors / Indicators: Grimacing Pain Intervention(s): Monitored during session, RN gave pain meds during session     Hand Dominance Right   Extremity/Trunk Assessment Upper Extremity Assessment Upper Extremity Assessment: Overall WFL for tasks assessed   Lower Extremity Assessment Lower Extremity Assessment: Defer to PT evaluation   Cervical / Trunk Assessment Cervical / Trunk Assessment: Kyphotic;Other exceptions Cervical / Trunk Exceptions: lumbar fx   Communication Communication Communication: No difficulties   Cognition Arousal/Alertness: Awake/alert Behavior During Therapy: WFL for tasks assessed/performed Overall Cognitive Status: Within Functional Limits for tasks assessed                                       General Comments       Exercises     Shoulder Instructions      Home Living Family/patient expects to be discharged to:: Private residence Living Arrangements: Other relatives Available Help at Discharge: Personal care attendant;Available PRN/intermittently Type of Home: House Home Access: Stairs to enter [employer] of Steps: 2   Home Layout: One level     Bathroom Shower/Tub: Occupational psychologist: Standard     Home Equipment: Conservation officer, nature (2 wheels);Cane - single point;Wheelchair - manual          Prior Functioning/Environment Prior Level of Function : Independent/Modified Independent;Needs assist             Mobility Comments: pt  reports no AD use with household ambulation, aide can assist with in/out of bed if needed ADLs Comments: pt reports ind with self care, aide present if assistance is needed, aide completes household chores        OT Problem List: Decreased strength;Decreased activity tolerance;Impaired balance (sitting and/or standing);Decreased knowledge of use of DME or AE;Cardiopulmonary status limiting activity;Pain;Decreased knowledge of precautions      OT Treatment/Interventions: Self-care/ADL training;Therapeutic exercise;DME and/or AE instruction;Therapeutic activities;Balance training;Patient/family education    OT Goals(Current goals can be found in the care plan section) Acute Rehab OT Goals Patient Stated Goal: do more OT Goal Formulation: With patient Time For Goal Achievement: 05/20/22 Potential to Achieve Goals: Good  OT Frequency: Min 2X/week    Co-evaluation              AM-PAC OT "6 Clicks" Daily Activity     Outcome Measure Help from another person eating meals?: A Little Help from another person taking care of personal grooming?: A Little Help from another person toileting, which includes using toliet, bedpan, or urinal?: A Lot Help from another person bathing (including washing, rinsing, drying)?: A Lot Help from another person to put on and taking off regular upper body clothing?: A Little Help from another person to put on and taking off regular lower body clothing?: A Lot 6 Click Score: 15  End of Session Equipment Utilized During Treatment: Rolling walker (2 wheels);Gait belt Nurse Communication: Mobility status  Activity Tolerance: Patient tolerated treatment well Patient left: in chair;with call bell/phone within reach;with chair alarm set  OT Visit Diagnosis: Pain                Time: 6431-4276 OT Time Calculation (min): 19 min Charges:  OT General Charges $OT Visit: 1 Visit  Gustavo Lah, OTR/L [place]  Office [phone]   Lenward Chancellor 05/06/2022, 9:34 AM

## 2022-05-06 NOTE — Progress Notes (Signed)
Report called to [facility] Opportunity for questions given, all questions answered.   Dewayne Hatch, RN

## 2022-05-06 NOTE — Care Management Important Message (Signed)
Important Message  Patient Details IM Letter placed in Patient's room Name: Cindy [name] MRN: [id number] Date of Birth: June 23, 1922   Medicare Important Message Given:  Yes     Kerin Salen 05/06/2022, 11:28 AM

## 2022-05-06 NOTE — Discharge Instructions (Signed)
Information on my medicine - ELIQUIS (apixaban)  This medication education was reviewed with me or my healthcare representative as part of my discharge preparation.   Why was Eliquis prescribed for you? Eliquis was prescribed to treat blood clots that may have been found in the veins of your legs (deep vein thrombosis) or in your lungs (pulmonary embolism) and to reduce the risk of them occurring again.  What do You need to know about Eliquis ? The starting dose is 10 mg (two 5 mg tablets) taken TWICE daily for the FIRST SEVEN (7) DAYS, then on Thursday 05/13/2022  the dose is reduced to ONE 5 mg tablet taken TWICE daily.  Eliquis may be taken with or without food.   Try to take the dose about the same time in the morning and in the evening. If you have difficulty swallowing the tablet whole please discuss with your pharmacist how to take the medication safely.  Take Eliquis exactly as prescribed and DO NOT stop taking Eliquis without talking to the doctor who prescribed the medication.  Stopping may increase your risk of developing a new blood clot.  Refill your prescription before you run out.  After discharge, you should have regular check-up appointments with your healthcare provider that is prescribing your Eliquis.    What do you do if you miss a dose? If a dose of ELIQUIS is not taken at the scheduled time, take it as soon as possible on the same day and twice-daily administration should be resumed. The dose should not be doubled to make up for a missed dose.  Important Safety Information A possible side effect of Eliquis is bleeding. You should call your healthcare provider right away if you experience any of the following: Bleeding from an injury or your nose that does not stop. Unusual colored urine (red or dark brown) or unusual colored stools (red or black). Unusual bruising for unknown reasons. A serious fall or if you hit your head (even if there is no bleeding).  Some  medicines may interact with Eliquis and might increase your risk of bleeding or clotting while on Eliquis. To help avoid this, consult your healthcare provider or pharmacist prior to using any new prescription or non-prescription medications, including herbals, vitamins, non-steroidal anti-inflammatory drugs (NSAIDs) and supplements.  This website has more information on Eliquis (apixaban): http://www.eliquis.com/eliquis/home

## 2022-05-06 NOTE — Progress Notes (Signed)
ANTICOAGULATION CONSULT NOTE - Follow Up Consult  Pharmacy Consult for Heparin Indication: pulmonary embolus (PTA apixaban)   Allergies  Allergen Reactions   Fish Allergy Hives, Nausea And Vomiting, Swelling and Other (See Comments)    Redness    Diclofenac Other (See Comments)    Not allergic per caregiver    Ibuprofen Other (See Comments)    Affects kidney function   Sulfasalazine Other (See Comments)    Unknown    Sulfa Antibiotics     Unknown     Patient Measurements: Height: 5' 2.99" (160 cm) Weight: 46.4 kg (102 lb 4.7 oz) IBW/kg (Calculated) : 52.38 Heparin Dosing Weight: TBW  Vital Signs: Temp: 98.6 F (37 C) (01/25 0412) Temp Source: Oral (01/25 0412) BP: 162/77 (01/25 0412) Pulse Rate: 62 (01/25 0412)  Labs: Recent Labs    05/04/22 0605 05/04/22 1003 05/04/22 1355 05/05/22 0356 05/06/22 0350  HGB 11.1*  --   --  11.5* 10.9*  HCT 33.5*  --   --  35.4* 32.4*  PLT 208  --   --  227 250  HEPARINUNFRC 0.46  --  0.48 0.50 0.28*  CREATININE  --  1.09*  --   --   --      Estimated Creatinine Clearance: 21.6 mL/min (A) (by C-G formula based on SCr of 1.09 mg/dL (H)).   Assessment: Cindy [name] admitted 1/19 pm s/p fall with acute respiratory failure with hypoxia. PMH significant for PE (11/2019) and is on Apixaban PTA. D-Dimer elevated 10.38, Baseline INR 1, heparin level < 0.1 units/mL, CBC WNL. 1/22 VQ Scan positive for perfusion defects in RML, RLL. Unclear if acute or chronic PE.  PTA Apixaban 2.5 mg once daily - prescribed as 2.5 BID.  Last dose reportedly 1/19, no recent outpatient fills.  Today, 05/06/2022: HL 0.28 subtherapeutic on 700 units/hr CBC: Hgb 10.9, plts WNL No bleeding or complications reported.  Goal of Therapy:  Heparin level 0.3-0.7 units/ml Monitor platelets by anticoagulation protocol: Yes   Plan:  increase IV heparin infusion to 800 units/hr Heparin level in 8 hours Daily heparin level and CBC Monitor closely for s/sx of  bleeding Follow up long-term anticoagulation plans     Dolly Rias RPh 05/06/2022, 4:32 AM

## 2022-05-06 NOTE — Progress Notes (Addendum)
ANTICOAGULATION CONSULT NOTE - Follow Up Consult  Pharmacy Consult for Heparin>> apixaban Indication: pulmonary embolus (PTA apixaban)   Allergies  Allergen Reactions   Fish Allergy Hives, Nausea And Vomiting, Swelling and Other (See Comments)    Redness    Diclofenac Other (See Comments)    Not allergic per caregiver    Ibuprofen Other (See Comments)    Affects kidney function   Sulfasalazine Other (See Comments)    Unknown    Sulfa Antibiotics     Unknown     Patient Measurements: Height: 5' 2.99" (160 cm) Weight: 46.8 kg (103 lb 2.8 oz) IBW/kg (Calculated) : 52.38 Heparin Dosing Weight: TBW  Vital Signs: Temp: 98.6 F (37 C) (01/25 0412) Temp Source: Oral (01/25 0412) BP: 162/77 (01/25 0412) Pulse Rate: 62 (01/25 0412)  Labs: Recent Labs    05/04/22 0605 05/04/22 1003 05/04/22 1355 05/05/22 0356 05/06/22 0350  HGB 11.1*  --   --  11.5* 10.9*  HCT 33.5*  --   --  35.4* 32.4*  PLT 208  --   --  227 250  HEPARINUNFRC 0.46  --  0.48 0.50 0.28*  CREATININE  --  1.09*  --   --   --      Estimated Creatinine Clearance: 21.8 mL/min (A) (by C-G formula based on SCr of 1.09 mg/dL (H)).   Assessment: 78 yoF admitted 1/19 pm s/p fall with acute respiratory failure with hypoxia. PMH significant for PE (11/2019) and is on Apixaban PTA. D-Dimer elevated 10.38, Baseline INR 1, heparin level < 0.1 units/mL, CBC WNL. 1/22 VQ Scan positive for perfusion defects in RML, RLL. Unclear if acute or chronic PE.  PTA Apixaban 2.5 mg once daily - prescribed as 2.5 BID.  Last dose reportedly 1/19, no recent outpatient fills.  Today, 05/06/2022: HL 0.28 subtherapeutic on 700 units/hr CBC: Hgb 10.9, plts WNL No bleeding or complications reported. Pharmacy consulted to transition heparin drip to full treatment dose apixaban for PE (d/w Dr Lupita Leash)  Goal of Therapy:  Heparin level 0.3-0.7 units/ml Monitor platelets by anticoagulation protocol: Yes   Plan:    DC heparin drip Apixaban  10 mg po bid x 7 days followed by apixaban 5 mg po bid Will provide education & 30 day free card today with patient & her caregiver  Eudelia Bunch, Pharm.D Use secure chat for questions 05/06/2022 10:45 AM

## 2022-05-06 NOTE — TOC Transition Note (Addendum)
Transition of Care [facility]) - CM/SW Discharge Note   Patient Details  Name: Cindy [name] MRN: [id number] Date of Birth: September 17, 1922  [facility] CM/SW Contact:  Vassie Moselle, LCSW Phone Number: 05/06/2022, 12:44 PM   Clinical Narrative:    Met with pt in room to review bed offers. Pt accepted bed offer for [facility]. CSW called pt's caregiver, Maurice March to inform of pt's choice. Mr Jimmye Norman is agreeable to this and is agreeable to discharge plan for today. Pt also recommended for palliative care to follow while at SNF. Pt's son agreeable to this. Referral made to [facility] for outpatient palliative care.  Pt will be going to room 1205p. RN to call report to [phone]. Pt will be transported via [facility].    Final next level of care: [place] Barriers to Discharge: Barriers Resolved   Patient Goals and CMS Choice   Choice offered to / list presented to : Patient  Discharge Placement                Patient chooses bed at: [facility] Patient to be transferred to facility by: [facility] Name of family member notified: Maurice March Patient and family notified of of transfer: 05/06/22  Discharge Plan and Services Additional resources added to the After Visit Summary for     Discharge Planning Services: CM Consult            DME Arranged: N/A DME Agency: NA                  Social Determinants of Health ([place]) Interventions SDOH Screenings   Food Insecurity: No Food Insecurity (05/05/2022)  Housing: Low Risk  (05/05/2022)  Transportation Needs: No Transportation Needs (05/05/2022)  Utilities: Not At Risk (05/05/2022)  Tobacco Use: Low Risk  (04/30/2022)     Readmission Risk Interventions     No data to display

## 2022-11-05 ENCOUNTER — Other Ambulatory Visit: Payer: Self-pay

## 2022-11-05 ENCOUNTER — Emergency Department (HOSPITAL_COMMUNITY)
Admission: EM | Admit: 2022-11-05 | Discharge: 2022-11-08 | Disposition: A | Discharge status: Home / Self Care | Payer: Medicare Other | Attending: Emergency Medicine | Admitting: Emergency Medicine

## 2022-11-05 DIAGNOSIS — Z0279 Encounter for issue of other medical certificate: Secondary | ICD-10-CM | POA: Diagnosis not present

## 2022-11-05 DIAGNOSIS — R300 Dysuria: Secondary | ICD-10-CM | POA: Insufficient documentation

## 2022-11-05 DIAGNOSIS — N3000 Acute cystitis without hematuria: Secondary | ICD-10-CM | POA: Insufficient documentation

## 2022-11-05 DIAGNOSIS — Z7901 Long term (current) use of anticoagulants: Secondary | ICD-10-CM | POA: Diagnosis not present

## 2022-11-05 DIAGNOSIS — R2681 Unsteadiness on feet: Secondary | ICD-10-CM | POA: Insufficient documentation

## 2022-11-05 DIAGNOSIS — R2689 Other abnormalities of gait and mobility: Secondary | ICD-10-CM

## 2022-11-05 LAB — BASIC METABOLIC PANEL
Anion gap: 16 — ABNORMAL HIGH (ref 5–15)
BUN: 31 mg/dL — ABNORMAL HIGH (ref 8–23)
CO2: 20 mmol/L — ABNORMAL LOW (ref 22–32)
Calcium: 9.7 mg/dL (ref 8.9–10.3)
Chloride: 100 mmol/L (ref 98–111)
Creatinine, Ser: 1.2 mg/dL — ABNORMAL HIGH (ref 0.44–1.00)
GFR, Estimated: 41 mL/min — ABNORMAL LOW (ref >60)
Glucose, Bld: 110 mg/dL — ABNORMAL HIGH (ref 70–99)
Potassium: 5.4 mmol/L — ABNORMAL HIGH (ref 3.5–5.1)
Sodium: 136 mmol/L (ref 135–145)

## 2022-11-05 LAB — CBC
HCT: 39.3 % (ref 36.0–46.0)
Hemoglobin: 12.7 g/dL (ref 12.0–15.0)
MCH: 33.5 pg (ref 26.0–34.0)
MCHC: 32.3 g/dL (ref 30.0–36.0)
MCV: 103.7 fL — ABNORMAL HIGH (ref 80.0–100.0)
Platelets: 326 10*3/uL (ref 150–400)
RBC: 3.79 MIL/uL — ABNORMAL LOW (ref 3.87–5.11)
RDW: 13.4 % (ref 11.5–15.5)
WBC: 13.9 10*3/uL — ABNORMAL HIGH (ref 4.0–10.5)
nRBC: 0 % (ref 0.0–0.2)

## 2022-11-05 MED ORDER — POLYVINYL ALCOHOL 1.4 % OP SOLN
1.0000 [drp] | Freq: Every day | OPHTHALMIC | Status: DC
  Filled 2022-11-05: qty 15

## 2022-11-05 MED ORDER — APIXABAN 5 MG PO TABS
5.0000 mg | ORAL_TABLET | Freq: Two times a day (BID) | ORAL | Status: DC
  Administered 2022-11-06 – 2022-11-07 (×3): 5 mg via ORAL
  Filled 2022-11-05 (×3): qty 1

## 2022-11-05 MED ORDER — SODIUM CHLORIDE 0.9 % IV BOLUS
1000.0000 mL | Freq: Once | INTRAVENOUS | Status: AC
  Administered 2022-11-06: 1000 mL via INTRAVENOUS

## 2022-11-05 MED ORDER — PANTOPRAZOLE SODIUM 40 MG PO TBEC
40.0000 mg | DELAYED_RELEASE_TABLET | Freq: Every day | ORAL | Status: DC
  Administered 2022-11-06 – 2022-11-08 (×3): 40 mg via ORAL
  Filled 2022-11-05 (×3): qty 1

## 2022-11-05 NOTE — ED Triage Notes (Signed)
Pt bib [facility] from home. Pt not able to stay at home alone while her caregiver is hospitalized. Pt consulting with [facility] but they require pt to have medical assessment. Pt has no needs or complaints at this time.   124 CBG 98% RA 62 HR 140/92 16RR

## 2022-11-05 NOTE — ED Provider Notes (Signed)
[place] EMERGENCY DEPARTMENT AT [facility] Provider Note   CSN: [id number] Arrival date & time: 11/05/22  2044     History  Chief Complaint  Patient presents with   Medical Clearance    Cindy [name] is a 89 y.o. female.  Patient's son Cindy [name] (who lives in [place]) reports that the patient' was brought to ED because her 24 hour caretaker had a medical emergency and they do not feel the patient is safe alone in the house. The patient's daughter is in a nursing home in [place] and cannot care for her mother.  There are no other caregivers or family nearby.  Patient has "no short term memory" and "very poor balance."  Her son reports that she is not safe to be by herself  Patient has no acute complaints.  HPI     Home Medications Prior to Admission medications   Medication Sig Start Date End Date Taking? Authorizing Provider  acetaminophen (TYLENOL) 325 MG tablet Take 1-2 tablets (325-650 mg total) by mouth every 6 (six) hours as needed for mild pain or moderate pain (pain score 1-3 or temp > 100.5). Patient taking differently: Take 325 mg by mouth in the morning and at bedtime. 08/07/18   Beverely Low, MD  apixaban (ELIQUIS) 5 MG TABS tablet Take 2 tablets (10 mg total) by mouth 2 (two) times daily for 9 doses. 05/06/22 05/11/22  Lanae Boast, MD  apixaban (ELIQUIS) 5 MG TABS tablet Take 1 tablet (5 mg total) by mouth 2 (two) times daily. 05/13/22   Lanae Boast, MD  B Complex Vitamins (B COMPLEX 100 PO) Take 1 tablet by mouth daily.    [provider]  CRANBERRY PO Take 1 tablet by mouth daily.     [provider]  dextromethorphan-guaiFENesin (MUCINEX DM) 30-600 MG 12hr tablet Take 1 tablet by mouth daily.    [provider]  HYDROcodone-acetaminophen (NORCO/VICODIN) 5-325 MG tablet Take 1 tablet by mouth every 8 (eight) hours as needed for up to 3 doses for moderate pain or severe pain. 05/06/22   Lanae Boast, MD  hydroxypropyl  methylcellulose / hypromellose (ISOPTO TEARS / GONIOVISC) 2.5 % ophthalmic solution Place 1 drop into both eyes daily.    [provider]  levothyroxine (SYNTHROID) 75 MCG tablet Take 75 mcg by mouth daily before breakfast.  Patient not taking: Reported on 05/01/2022 07/01/16   [provider]  lidocaine (LIDODERM) 5 % Place 1 patch onto the skin daily. Remove & Discard patch within 12 hours or as directed by MD 05/07/22   Lanae Boast, MD  Multiple Vitamins-Minerals (OCUVITE EXTRA) TABS Take 1 tablet by mouth daily.    [provider]  omeprazole (PRILOSEC) 20 MG capsule Take 20 mg by mouth daily.    [provider]  simvastatin (ZOCOR) 20 MG tablet Take 1 tablet (20 mg total) by mouth every evening. 11/19/19   Shahmehdi, Gemma Payor, MD  Triamcinolone Acetonide 0.025 % LOTN Apply 1 application  topically as needed (itching). 08/24/19   [provider]      Allergies    Fish allergy, Diclofenac, Ibuprofen, Sulfasalazine, and Sulfa antibiotics    Review of Systems   Review of Systems  Physical Exam Updated Vital Signs BP (!) 191/88   Pulse 64   Temp (!) 97.5 F (36.4 C) (Oral)   Resp 17   Ht 5\' 7"  (1.702 m)   Wt 45.4 kg   SpO2 100%   BMI 15.66 kg/m  Physical  Exam Constitutional:      General: She is not in acute distress. HENT:     Head: Normocephalic and atraumatic.  Eyes:     Conjunctiva/sclera: Conjunctivae normal.     Pupils: Pupils are equal, round, and reactive to light.  Cardiovascular:     Rate and Rhythm: Normal rate and regular rhythm.  Pulmonary:     Effort: Pulmonary effort is normal. No respiratory distress.  Abdominal:     General: There is no distension.     Tenderness: There is no abdominal tenderness.  Skin:    General: Skin is warm and dry.  Neurological:     General: No focal deficit present.     Mental Status: She is alert and oriented to person, place, and time. Mental status is at baseline.     ED Results /  Procedures / Treatments   Labs (all labs ordered are listed, but only abnormal results are displayed) Labs Reviewed  BASIC METABOLIC PANEL  CBC  URINALYSIS, ROUTINE W REFLEX MICROSCOPIC    EKG None  Radiology No results found.  Procedures Procedures    Medications Ordered in ED Medications - No data to display  ED Course/ Medical Decision Making/ A&P                             Medical Decision Making Amount and/or Complexity of Data Reviewed Labs: ordered.   Patient is here for social concerns.  She has a history of likely dementia, though not formally diagnosed according to supplement history provided by her son on the phone.  He reports he is not safe to live by herself as she has balance difficulties" virtually no short-term memory".  Unfortunately are no nearby family members that can help take care of her, as her caregiver had an acute medical emergency today and had to be rushed to the hospital.  The patient's son is her healthcare power of attorney and the best contact - he would appreciate TOC/CM evaluation for placement options.           Final Clinical Impression(s) / ED Diagnoses Final diagnoses:  None    Rx / DC Orders ED Discharge Orders     None         Shermar Friedland, Kermit Balo, MD 11/05/22 2244

## 2022-11-06 DIAGNOSIS — Z0279 Encounter for issue of other medical certificate: Secondary | ICD-10-CM | POA: Diagnosis not present

## 2022-11-06 MED ORDER — CLONIDINE HCL 0.1 MG PO TABS
0.1000 mg | ORAL_TABLET | Freq: Three times a day (TID) | ORAL | Status: DC | PRN
  Administered 2022-11-06 – 2022-11-08 (×2): 0.1 mg via ORAL
  Filled 2022-11-06 (×3): qty 1

## 2022-11-06 MED ORDER — LISINOPRIL 10 MG PO TABS
10.0000 mg | ORAL_TABLET | Freq: Every day | ORAL | Status: DC
  Administered 2022-11-06 – 2022-11-08 (×3): 10 mg via ORAL
  Filled 2022-11-06 (×3): qty 1

## 2022-11-06 NOTE — ED Notes (Signed)
Pt received breakfast tray, currently eating.

## 2022-11-06 NOTE — Evaluation (Signed)
Physical Therapy Evaluation Patient Details Name: Cindy [name] MRN: [id number] DOB: 09/06/1922 Today's Date: 11/06/2022  History of Present Illness  Patient's son Ailanie Howman (who lives in [place]) reports that the patient' was brought to ED because her 24 hour caretaker had a medical emergency and they do not feel the patient is safe alone in the house. The patient's daughter is in a nursing home in [place] and cannot care for her mother.  Clinical Impression  Pt admitted with above diagnosis.  Pt currently with functional limitations due to the deficits listed below (see PT Problem List). Pt will benefit from acute skilled PT to increase their independence and safety with mobility to allow discharge.     The patient is pleasant and able to participate in mobility, Ambulated x 50' x 2 using Rw and min assist for balance.  Patient resides in her home with a caregiver, unsure of level of assistance. Patient reports that she is home alone at night and able to ambulate to BR, does not use AD,unsure reliability.  Patient oriented to hospital, wants know what is wrong with her and where her caregiver is(Roger Mayford Knife she states).  Patient will benefit from continued inpatient follow up therapy, <3 hours/day.       Assistance Recommended at Discharge Frequent or constant Supervision/Assistance  If plan is discharge home, recommend the following:  Can travel by private vehicle  A little help with walking and/or transfers;A lot of help with bathing/dressing/bathroom;Assistance with cooking/housework;Direct supervision/assist for medications management;Direct supervision/assist for financial management;Assist for transportation;Help with stairs or ramp for entrance   Yes    Equipment Recommendations None recommended by PT  Recommendations for Other Services       Functional Status Assessment Patient has had a recent decline in their functional status and demonstrates the ability to make  significant improvements in function in a reasonable and predictable amount of time.     Precautions / Restrictions Precautions Precautions: Fall      Mobility  Bed Mobility Overal bed mobility: Needs Assistance Bed Mobility: Rolling, Sidelying to Sit, Sit to Supine Rolling: Min assist Sidelying to sit: Min assist   Sit to supine: Min assist   General bed mobility comments: patient able to move  legs assisted with trunk to sit upright    Transfers Overall transfer level: Needs assistance Equipment used: Rolling walker (2 wheels) Transfers: Sit to/from Stand Sit to Stand: Min assist           General transfer comment: assist to rise from stretcher, assist to rise from toilet, ise of rail to pull up    Ambulation/Gait Ambulation/Gait assistance: Min assist Gait Distance (Feet): 40 Feet (x 2) Assistive device: Rolling walker (2 wheels) Gait Pattern/deviations: Step-to pattern, Step-through pattern, Drifts right/left Gait velocity: decr     General Gait Details: min assist to keep RW in  froward  progression, support for balance  Stairs            Wheelchair Mobility     Tilt Bed    Modified Rankin (Stroke Patients Only)       Balance Overall balance assessment: Needs assistance Sitting-balance support: Bilateral upper extremity supported, Feet supported Sitting balance-Leahy Scale: Poor Sitting balance - Comments: posterior lean at times, cues to lean forward   Standing balance support: Bilateral upper extremity supported, During functional activity, Reliant on assistive device for balance Standing balance-Leahy Scale: Poor Standing balance comment: reliant on ue support  Pertinent Vitals/Pain Pain Assessment Pain Assessment: Faces Faces Pain Scale: Hurts even more Pain Location: right thigh Pain Descriptors / Indicators: Aching, Grimacing, Guarding Pain Intervention(s): Monitored during session, Limited  activity within patient's tolerance    Home Living Family/patient expects to be discharged to:: Private residence Living Arrangements: Other relatives Available Help at Discharge: Personal care attendant;Available PRN/intermittently Type of Home: House Home Access: Stairs to enter Entrance Stairs-Rails: Right Entrance Stairs-Number of Steps: 2   Home Layout: One level Home Equipment: Agricultural consultant (2 wheels);Cane - single point;Wheelchair - manual Additional Comments: info from previous encounter, pt reports she is home alone at night unless needs careegiver. Reportedly the caregiver had emergency and pt brought to ED due to no caregivers    Prior Function Prior Level of Function : Needs assist             Mobility Comments: unsure, pt reports she does not use AD, gets up to BR at night ADLs Comments: unsure     Hand Dominance   Dominant Hand: Right    Extremity/Trunk Assessment   Upper Extremity Assessment Upper Extremity Assessment: LUE deficits/detail LUE Deficits / Details: abrasion on elbow    Lower Extremity Assessment Lower Extremity Assessment: LLE deficits/detail LLE Deficits / Details: noted abrasions about the kne    Cervical / Trunk Assessment Cervical / Trunk Assessment: Kyphotic  Communication   Communication: No difficulties  Cognition Arousal/Alertness: Awake/alert Behavior During Therapy: WFL for tasks assessed/performed Overall Cognitive Status: History of cognitive impairments - at baseline                                 General Comments: oriented to self, in hospital, name of caregiver, not situration, asking what's wrong with her. Son called while PT in room,. Patient not aware of caregiver  being unavailable        General Comments      Exercises     Assessment/Plan    PT Assessment Patient needs continued PT services  PT Problem List Decreased strength;Decreased activity tolerance;Decreased mobility;Decreased  cognition;Decreased safety awareness;Decreased balance;Pain;Decreased knowledge of precautions       PT Treatment Interventions DME instruction;Therapeutic activities;Balance training;Cognitive remediation;Gait training;Functional mobility training;Therapeutic exercise;Patient/family education    PT Goals (Current goals can be found in the Care Plan section)  Acute Rehab PT Goals Patient Stated Goal: patient-to go home, son to go to rehab PT Goal Formulation: With family Time For Goal Achievement: 11/20/22 Potential to Achieve Goals: Fair    Frequency Min 1X/week     Co-evaluation               AM-PAC PT "6 Clicks" Mobility  Outcome Measure Help needed turning from your back to your side while in a flat bed without using bedrails?: A Little Help needed moving from lying on your back to sitting on the side of a flat bed without using bedrails?: A Little Help needed moving to and from a bed to a chair (including a wheelchair)?: A Lot Help needed standing up from a chair using your arms (e.g., wheelchair or bedside chair)?: A Lot Help needed to walk in hospital room?: A Lot Help needed climbing 3-5 steps with a railing? : Total 6 Click Score: 13    End of Session Equipment Utilized During Treatment: Gait belt Activity Tolerance: Patient tolerated treatment well Patient left: in bed;with call bell/phone within reach Nurse Communication: Mobility status PT Visit Diagnosis: Unsteadiness  on feet (R26.81);Difficulty in walking, not elsewhere classified (R26.2);Pain Pain - Right/Left: Left Pain - part of body: Leg    Time: 1415-1453 PT Time Calculation (min) (ACUTE ONLY): 38 min   Charges:   PT Evaluation $PT Eval Low Complexity: 1 Low PT Treatments $Gait Training: 8-22 mins $Self Care/Home Management: 8-22 PT General Charges $$ ACUTE PT VISIT: 1 Visit         Blanchard Kelch PT Acute Rehabilitation Services Office [phone] Weekend pager-[phone]   Rada Hay 11/06/2022, 3:48 PM

## 2022-11-06 NOTE — Progress Notes (Addendum)
TOC CSW received a consult and spoke with pt's son, he reports, pt does not have a caretaker at home. He said pt is not able to care for herself. CSW inquired if they plan on getting another Caretaker to come out to the home he stated he would like her to be placed. Pt's son reports he would like her to be placed short-term until he can come to [place]. CSW explained physical therapy would have to be recommended for Short-term rehab for SNF placement. Per chart review, pt has traditional Medicare A&B with [facility] waver. PT eval has been place. TOC to follow.   Valentina Shaggy.Raeanna Soberanes, MSW, LCSWA [facility]  Transitions of Care Clinical Social Worker I Direct Dial: [phone]  Fax: [phone] [email or web address]

## 2022-11-06 NOTE — ED Provider Notes (Addendum)
Emergency Medicine Observation Re-evaluation Note  Cindy [name] is a 89 y.o. female, seen on rounds today.  Pt initially presented to the ED for complaints of Medical Clearance Currently, the patient is awaiting nursing home placement.  Physical Exam  BP (!) 183/93   Pulse (!) 59   Temp 97.8 F (36.6 C) (Axillary)   Resp 14   Ht 5\' 7"  (1.702 m)   Wt 45.4 kg   SpO2 97%   BMI 15.66 kg/m  Physical Exam Alert and in no acute distress  ED Course / MDM  EKG:   I have reviewed the labs performed to date as well as medications administered while in observation.  Recent changes in the last 24 hours include none.  Plan  Current plan is for nursing home placement   Bethann Berkshire, MD 11/06/22 1438    Bethann Berkshire, MD 11/06/22 1439

## 2022-11-07 DIAGNOSIS — Z0279 Encounter for issue of other medical certificate: Secondary | ICD-10-CM | POA: Diagnosis not present

## 2022-11-07 MED ORDER — APIXABAN 2.5 MG PO TABS
2.5000 mg | ORAL_TABLET | Freq: Two times a day (BID) | ORAL | Status: DC
  Administered 2022-11-08: 2.5 mg via ORAL
  Filled 2022-11-07 (×2): qty 1

## 2022-11-07 NOTE — Progress Notes (Addendum)
CSW attempted to speak with pt's Gar Ponto , no answer left VM proving update. SNF beds pending at this time. TOC to follow.   ADDEN 12:05pm Received call from pt's son , he stated he will be flying in to [place] from [place] tomorrow. He is requesting [facility], stating pt is very familiar with this facility. Pt son was informed SNF bed offers are pending. TOC to follow.     Valentina Shaggy.Luqman Perrelli, MSW, LCSWA [facility]  Transitions of Care Clinical Social Worker I Direct Dial: [phone]  Fax: [phone] [email or web address]

## 2022-11-07 NOTE — ED Notes (Addendum)
Pt ambulated to bathroom with walker, complains of legs being sore. Pt also is creating a bedsore from being bed bound these few days, I applied moisturizer cream to area. Pt also had a BM :)

## 2022-11-07 NOTE — ED Notes (Signed)
Pt received breakfast tray 

## 2022-11-07 NOTE — ED Provider Notes (Signed)
Emergency Medicine Observation Re-evaluation Note  Cindy [name] is a 89 y.o. female, seen on rounds today.  Pt initially presented to the ED for complaints of Medical Clearance Currently, the patient is awaiting nursing home placement.  Physical Exam  BP (!) 160/84   Pulse (!) 57   Temp 97.8 F (36.6 C) (Oral)   Resp 17   Ht 5\' 7"  (1.702 m)   Wt 45.4 kg   SpO2 100%   BMI 15.66 kg/m  Physical Exam Alert and in no acute distress  ED Course / MDM  EKG:   I have reviewed the labs performed to date as well as medications administered while in observation.  Recent changes in the last 24 hours include none.  Plan  Current plan is for nursing home placement      Melene Plan, DO 11/07/22 0272

## 2022-11-07 NOTE — ED Notes (Signed)
Lunch tray given. 

## 2022-11-07 NOTE — ED Notes (Signed)
SW reports no accepting facilities at this time, working on arranging placement.

## 2022-11-07 NOTE — [place] FL2 (Signed)
[place] MEDICAID FL2 LEVEL OF CARE FORM     IDENTIFICATION  Patient Name: Cindy [name] Birthdate: 01/19/1923 Sex: female Admission Date (Current Location): 11/05/2022  [facility] and [place] Number:  Producer, television/film/video and Address:  [facility],  [address] [place], [place] [address]      Provider Number: [id number]  Attending Physician Name and Address:  Default, Provider, MD  Relative Name and Phone Number:  Cordie Grice (Son)  [phone] [facility])    Current Level of Care: Hospital Recommended Level of Care: Skilled Nursing Facility Prior Approval Number:    Date Approved/Denied:   PASRR Number: [id number]  Discharge Plan: SNF    Current Diagnoses: Patient Active Problem List   Diagnosis Date Noted   Acute respiratory failure with hypoxia (HCC) 04/30/2022   Closed compression fracture of L2 vertebra (HCC) 04/30/2022   History of pulmonary embolism 04/30/2022   DNR (do not resuscitate)/DNI(Do Not Intubate) 04/30/2022   Fall at home, initial encounter 06/09/2020   Chronic kidney disease, stage 3a (HCC) 06/09/2020   Palliative care by specialist    Goals of care, counseling/discussion    General weakness    Dementia (HCC) 11/16/2019   Hypothyroidism 11/16/2019   Pancreatic mass 11/16/2019   Pain due to onychomycosis of toenails of both feet 10/03/2018   H/O total shoulder replacement, left 08/04/2018   Pressure injury of skin 06/13/2018   Constipation, chronic 06/28/2016   Eventration of left crus of diaphragm 06/27/2016   Uncontrolled hypertension 06/27/2016   CRI (chronic renal insufficiency), stage 4 (severe) (HCC) 06/14/2016   Primary hypothyroidism 06/14/2016   Osteoporosis, postmenopausal 06/14/2016   OAB (overactive bladder) 02/25/2016   Left bundle branch block 12/14/2012   Essential hypertension 12/14/2012   Mixed hyperlipidemia 12/14/2012   Pain in shoulder 11/11/2011   Melanoma of left upper arm (HCC) 10/19/2011   Lung mass  06/16/2011   Acquired deformities of toe 06/14/2011   Personal history of other diseases of the digestive system 06/07/2011   GERD without esophagitis 06/07/2011    Orientation RESPIRATION BLADDER Height & Weight     Self  Normal External catheter Weight: 100 lb (45.4 kg) Height:  5\' 7"  (170.2 cm)  BEHAVIORAL SYMPTOMS/MOOD NEUROLOGICAL BOWEL NUTRITION STATUS      Continent Diet (regular)  AMBULATORY STATUS COMMUNICATION OF NEEDS Skin   Limited Assist Verbally Normal                       Personal Care Assistance Level of Assistance  Bathing, Feeding, Dressing Bathing Assistance: Limited assistance Feeding assistance: Independent Dressing Assistance: Limited assistance     Functional Limitations Info  Sight, Hearing, Speech Sight Info: Adequate Hearing Info: Adequate Speech Info: Adequate    SPECIAL CARE FACTORS FREQUENCY  PT (By licensed PT), OT (By licensed OT)     PT Frequency: 5 x a week OT Frequency: 5 x a week            Contractures Contractures Info: Not present    Additional Factors Info  Code Status, Allergies Code Status Info: full Allergies Info: Fish Allergy  Diclofenac  Ibuprofen  Sulfasalazine  Sulfa Antibiotics           Current Medications (11/07/2022):  This is the current hospital active medication list Current Facility-Administered Medications  Medication Dose Route Frequency Provider Last Rate Last Admin   apixaban (ELIQUIS) tablet 5 mg  5 mg Oral BID Terald Sleeper, MD   5 mg at 11/06/22 2127  cloNIDine (CATAPRES) tablet 0.1 mg  0.1 mg Oral TID PRN Charlynne Pander, MD   0.1 mg at 11/06/22 2127   lisinopril (ZESTRIL) tablet 10 mg  10 mg Oral Daily Bethann Berkshire, MD   10 mg at 11/06/22 0916   pantoprazole (PROTONIX) EC tablet 40 mg  40 mg Oral Daily Terald Sleeper, MD   40 mg at 11/06/22 4010   polyvinyl alcohol (LIQUIFILM TEARS) 1.4 % ophthalmic solution 1 drop  1 drop Both Eyes Daily Trifan, Kermit Balo, MD       Current  Outpatient Medications  Medication Sig Dispense Refill   acetaminophen (TYLENOL) 325 MG tablet Take 1-2 tablets (325-650 mg total) by mouth every 6 (six) hours as needed for mild pain or moderate pain (pain score 1-3 or temp > 100.5). (Patient taking differently: Take 325 mg by mouth in the morning and at bedtime.)     apixaban (ELIQUIS) 5 MG TABS tablet Take 2 tablets (10 mg total) by mouth 2 (two) times daily for 9 doses. 9 tablet 0   apixaban (ELIQUIS) 5 MG TABS tablet Take 1 tablet (5 mg total) by mouth 2 (two) times daily. 60 tablet    B Complex Vitamins (B COMPLEX 100 PO) Take 1 tablet by mouth daily.     CRANBERRY PO Take 1 tablet by mouth daily.      dextromethorphan-guaiFENesin (MUCINEX DM) 30-600 MG 12hr tablet Take 1 tablet by mouth daily.     HYDROcodone-acetaminophen (NORCO/VICODIN) 5-325 MG tablet Take 1 tablet by mouth every 8 (eight) hours as needed for up to 3 doses for moderate pain or severe pain. 3 tablet 0   hydroxypropyl methylcellulose / hypromellose (ISOPTO TEARS / GONIOVISC) 2.5 % ophthalmic solution Place 1 drop into both eyes daily.     levothyroxine (SYNTHROID) 75 MCG tablet Take 75 mcg by mouth daily before breakfast.  (Patient not taking: Reported on 05/01/2022)     lidocaine (LIDODERM) 5 % Place 1 patch onto the skin daily. Remove & Discard patch within 12 hours or as directed by MD 30 patch 0   Multiple Vitamins-Minerals (OCUVITE EXTRA) TABS Take 1 tablet by mouth daily.     omeprazole (PRILOSEC) 20 MG capsule Take 20 mg by mouth daily.     simvastatin (ZOCOR) 20 MG tablet Take 1 tablet (20 mg total) by mouth every evening. 30 tablet 1   Triamcinolone Acetonide 0.025 % LOTN Apply 1 application  topically as needed (itching).       Discharge Medications: Please see discharge summary for a list of discharge medications.  Relevant Imaging Results:  Relevant Lab Results:   Additional Information SSN:[id number]  Valentina Shaggy Addison Whidbee, LCSW

## 2022-11-07 NOTE — ED Notes (Signed)
RN and tech initiated regular turns in bed with pillow offsetting pressure after pt c/o discomfort on backside. Some redness noted.

## 2022-11-08 ENCOUNTER — Encounter (HOSPITAL_COMMUNITY): Payer: Self-pay

## 2022-11-08 DIAGNOSIS — Z0279 Encounter for issue of other medical certificate: Secondary | ICD-10-CM | POA: Diagnosis not present

## 2022-11-08 MED ORDER — LEVOTHYROXINE SODIUM 88 MCG PO TABS
88.0000 ug | ORAL_TABLET | Freq: Every day | ORAL | Status: DC
  Administered 2022-11-08: 88 ug via ORAL
  Filled 2022-11-08: qty 1

## 2022-11-08 MED ORDER — FOSFOMYCIN TROMETHAMINE 3 G PO PACK
3.0000 g | PACK | Freq: Once | ORAL | Status: AC
  Administered 2022-11-08: 3 g via ORAL
  Filled 2022-11-08: qty 3

## 2022-11-08 NOTE — ED Provider Notes (Signed)
89 yo female here pending placement in long term care UA from arrival consistent with UTI  - will treat with fosfomycin 1 dose here Labs otherwise showed mild hyperkalemia improved with IV fluids on repeat check   Physical Exam  BP (!) 151/77   Pulse 66   Temp 98.4 F (36.9 C)   Resp 17   Ht 5\' 7"  (1.702 m)   Wt 45.4 kg   SpO2 92%   BMI 15.66 kg/m   Physical Exam  Procedures  Procedures  ED Course / MDM    Medical Decision Making Amount and/or Complexity of Data Reviewed Labs: ordered.  Risk Prescription drug management.   Pending placement       Terald Sleeper, MD 11/08/22 [phone]

## 2022-11-08 NOTE — Discharge Instructions (Signed)
Cindy [name] was treated with fosfomycin as a single dose for a possible UTI in the ER today.  After discussion with her family, it was felt that she could not safely care for herself at home, given her memory deficits and balance difficulties.  She is a fall risk, currently on reduced-dose eliquis.

## 2022-11-08 NOTE — ED Notes (Signed)
Pt requested to speak to someone. Sitter come to Clinical research associate to assist. Pt upset, feels she is able to go home and take care herself. She does not want to go to a facility. She states she is able to cook, bathe self, drive and her aide has been assisting her when she needs it. Feels she has not been included in her plan of care. Allowed pt to vent and support her feelings. She is aware the team will keep her updated as they are made aware of plan at discharge.

## 2022-11-08 NOTE — ED Provider Notes (Signed)
Accepted at [facility], Kermit Balo, MD 11/08/22 1400

## 2022-11-08 NOTE — Progress Notes (Signed)
Physical Therapy Treatment Patient Details Name: Cindy [name] MRN: [id number] DOB: [birth year] Today's Date: 11/08/2022   History of Present Illness Patient's son Cindy [name] (who lives in [place]) reports that the patient' was brought to ED because her 24 hour caretaker had a medical emergency and they do not feel the patient is safe alone in the house. The patient's daughter is in a nursing home in [place] and cannot care for her mother.    PT Comments  The patient  perseverating that she does not know why she is here and that she can take care of her self. Re enforced to patient that her son is coming soon.  Continue PT.    If plan is discharge home, recommend the following: A little help with walking and/or transfers;A lot of help with bathing/dressing/bathroom;Assistance with cooking/housework;Direct supervision/assist for medications management;Direct supervision/assist for financial management;Assist for transportation;Help with stairs or ramp for entrance   Can travel by private vehicle     Yes  Equipment Recommendations  None recommended by PT    Recommendations for Other Services       Precautions / Restrictions Precautions Precautions: Fall Precaution Comments: abrasions with eschar on Left leg Restrictions Weight Bearing Restrictions: No     Mobility  Bed Mobility Overal bed mobility: Needs Assistance Bed Mobility: Rolling, Sidelying to Sit, Sit to Supine   Sidelying to sit: Min assist       General bed mobility comments: patient able to move  legs assisted with trunk to sit upright    Transfers Overall transfer level: Needs assistance Equipment used: Rolling walker (2 wheels) Transfers: Sit to/from Stand Sit to Stand: Min assist           General transfer comment: assist to rise from stretcher,    Ambulation/Gait Ambulation/Gait assistance: Min assist Gait Distance (Feet): 120 Feet Assistive device: Rolling walker (2 wheels) Gait  Pattern/deviations: Step-to pattern, Step-through pattern, Drifts right/left Gait velocity: decr     General Gait Details: min assist to keep RW in  froward  progression, support for balance   Stairs             Wheelchair Mobility     Tilt Bed    Modified Rankin (Stroke Patients Only)       Balance Overall balance assessment: Needs assistance Sitting-balance support: Bilateral upper extremity supported, Feet supported Sitting balance-Leahy Scale: Good     Standing balance support: Bilateral upper extremity supported, During functional activity, Reliant on assistive device for balance Standing balance-Leahy Scale: Poor Standing balance comment: reliant on ue support                            Cognition Arousal/Alertness: Awake/alert Behavior During Therapy: WFL for tasks assessed/performed Overall Cognitive Status: History of cognitive impairments - at baseline                                 General Comments: oriented to self, worried that her caregiver is sick, wants to go home. States"I can take care of myself."        Exercises      General Comments        Pertinent Vitals/Pain Pain Assessment Pain Assessment: Faces Faces Pain Scale: Hurts a little bit Pain Location: buttocks Pain Descriptors / Indicators: Discomfort Pain Intervention(s): Monitored during session, Repositioned (Getting bed and RN aware)    Home Living  Prior Function            PT Goals (current goals can now be found in the care plan section) Progress towards PT goals: Progressing toward goals    Frequency    Min 1X/week      PT Plan Current plan remains appropriate    Co-evaluation              AM-PAC PT "6 Clicks" Mobility   Outcome Measure  Help needed turning from your back to your side while in a flat bed without using bedrails?: A Little Help needed moving from lying on your back to  sitting on the side of a flat bed without using bedrails?: A Little Help needed moving to and from a bed to a chair (including a wheelchair)?: A Little Help needed standing up from a chair using your arms (e.g., wheelchair or bedside chair)?: A Little Help needed to walk in hospital room?: A Little Help needed climbing 3-5 steps with a railing? : A Lot 6 Click Score: 17    End of Session Equipment Utilized During Treatment: Gait belt Activity Tolerance: Patient tolerated treatment well Patient left: in bed;with nursing/sitter in room Nurse Communication: Mobility status PT Visit Diagnosis: Unsteadiness on feet (R26.81);Difficulty in walking, not elsewhere classified (R26.2);Pain Pain - Right/Left: Left Pain - part of body: Leg     Time: 0900-0916 PT Time Calculation (min) (ACUTE ONLY): 16 min  Charges:    $Gait Training: 8-22 mins PT General Charges $$ ACUTE PT VISIT: 1 Visit                     Blanchard Kelch PT Acute Rehabilitation Services Office [phone] Weekend pager-[phone]     Rada Hay 11/08/2022, 9:40 AM

## 2022-11-08 NOTE — ED Notes (Signed)
Pt assisted into ambulating with walker. ED stretcher changed out for a hospital bed. Barrier cream placed. Pillow placed under lt buttock for regular offsetting of pressure.

## 2022-11-08 NOTE — Progress Notes (Signed)
CSW spoke with patients son Dorene Sorrow [phone] who accepted the bed offer at [facility] Dorene Sorrow stated he is currently on the train from [place] and should be in [place] around noon   CSW contacted Lawerance Cruel in admissions at [facility] who stated they can accept patient. Lawerance Cruel is currently verifying if there will be a room open today. CSW contacted patients son Dorene Sorrow who will go to [place] at 2:00 PM today to complete paperwork.

## 2022-11-08 NOTE — ED Notes (Signed)
Medication has been on hold pt is asleep.

## 2022-11-11 ENCOUNTER — Telehealth (HOSPITAL_BASED_OUTPATIENT_CLINIC_OR_DEPARTMENT_OTHER): Payer: Self-pay | Admitting: *Deleted

## 2022-11-11 NOTE — Telephone Encounter (Signed)
Post ED Visit - Positive Culture Follow-up  Culture report reviewed by antimicrobial stewardship pharmacist: [facility] []  Enzo Bi, Pharm.D. []  Celedonio Miyamoto, Pharm.D., BCPS AQ-ID []  Garvin Fila, Pharm.D., BCPS []  Georgina Pillion, [address].D., BCPS []  [place], [place].D., BCPS, AAHIVP []  Estella Husk, Pharm.D., BCPS, AAHIVP []  Lysle Pearl, PharmD, BCPS []  Phillips Climes, PharmD, BCPS []  Agapito Games, PharmD, BCPS []  Verlan Friends, PharmD []  Mervyn Gay, PharmD, BCPS []  Vinnie Level, PharmD  [facility] [x]  Sharin Mons PharmD []  Greer Pickerel, PharmD []  Adalberto Cole, PharmD []  Perlie Gold, Rph []  Lonell Face) Jean Rosenthal, PharmD []  Earl Many, PharmD []  Junita Push, PharmD []  Dorna Leitz, PharmD []  Terrilee Files, PharmD []  Lynann Beaver, PharmD []  Keturah Barre, PharmD []  Loralee Pacas, PharmD []  Bernadene Person, PharmD   Positive urine culture Treated with Fosfamycin x once in the ED, organism sensitive to the same and no further patient follow-up is required at this time.  Bing Quarry 11/11/2022, 9:56 AM

## 2022-12-02 ENCOUNTER — Other Ambulatory Visit: Payer: Self-pay | Admitting: *Deleted

## 2022-12-02 NOTE — Patient Outreach (Signed)
Cindy [name] resides in [facility] skilled nursing facility. She admitted under [facility] ACO [place] waiver. Screening for potential care coordination services as a benefit of health plan and primary care provider.  Update received from [place], [facility] psychotherapist. Cindy [name] is progressing with therapy. Anticipated transition plan is for long term care.   Will continue to follow.  Raiford Noble, MSN, RN,BSN Post Acute Care Coordinator [phone] (Direct dial)

## 2023-01-13 ENCOUNTER — Other Ambulatory Visit: Payer: Self-pay | Admitting: *Deleted

## 2023-01-13 NOTE — Patient Outreach (Signed)
Post-Acute Care Coordinator follow up. Mrs. Derick resides in [facility].   Previously confirmed with [facility] social work team, Mrs. Coiner transitioned to long term care on 01/11/23 at [facility].  No identifiable complex case management needs.   Raiford Noble, MSN, RN, BSN [place]  [facility] RN Post- Acute Care Coordinator Direct Dial: [phone]

## 2023-08-27 ENCOUNTER — Emergency Department (HOSPITAL_COMMUNITY)

## 2023-08-27 ENCOUNTER — Inpatient Hospital Stay (HOSPITAL_COMMUNITY)

## 2023-08-27 ENCOUNTER — Other Ambulatory Visit: Payer: Self-pay

## 2023-08-27 ENCOUNTER — Encounter (HOSPITAL_COMMUNITY): Payer: Self-pay | Admitting: Emergency Medicine

## 2023-08-27 ENCOUNTER — Inpatient Hospital Stay (HOSPITAL_COMMUNITY)
Admission: EM | Admit: 2023-08-27 | Discharge: 2023-09-11 | DRG: 871 | Disposition: E | Attending: Internal Medicine | Admitting: Internal Medicine

## 2023-08-27 DIAGNOSIS — K219 Gastro-esophageal reflux disease without esophagitis: Secondary | ICD-10-CM | POA: Diagnosis present

## 2023-08-27 DIAGNOSIS — H9190 Unspecified hearing loss, unspecified ear: Secondary | ICD-10-CM | POA: Diagnosis present

## 2023-08-27 DIAGNOSIS — E785 Hyperlipidemia, unspecified: Secondary | ICD-10-CM | POA: Diagnosis present

## 2023-08-27 DIAGNOSIS — I129 Hypertensive chronic kidney disease with stage 1 through stage 4 chronic kidney disease, or unspecified chronic kidney disease: Secondary | ICD-10-CM | POA: Diagnosis present

## 2023-08-27 DIAGNOSIS — Z808 Family history of malignant neoplasm of other organs or systems: Secondary | ICD-10-CM

## 2023-08-27 DIAGNOSIS — F32A Depression, unspecified: Secondary | ICD-10-CM | POA: Diagnosis present

## 2023-08-27 DIAGNOSIS — L89156 Pressure-induced deep tissue damage of sacral region: Secondary | ICD-10-CM | POA: Diagnosis present

## 2023-08-27 DIAGNOSIS — H3530 Unspecified macular degeneration: Secondary | ICD-10-CM | POA: Diagnosis present

## 2023-08-27 DIAGNOSIS — K449 Diaphragmatic hernia without obstruction or gangrene: Secondary | ICD-10-CM

## 2023-08-27 DIAGNOSIS — N179 Acute kidney failure, unspecified: Secondary | ICD-10-CM

## 2023-08-27 DIAGNOSIS — Z888 Allergy status to other drugs, medicaments and biological substances status: Secondary | ICD-10-CM

## 2023-08-27 DIAGNOSIS — R6520 Severe sepsis without septic shock: Secondary | ICD-10-CM | POA: Diagnosis present

## 2023-08-27 DIAGNOSIS — E039 Hypothyroidism, unspecified: Secondary | ICD-10-CM | POA: Diagnosis present

## 2023-08-27 DIAGNOSIS — R54 Age-related physical debility: Secondary | ICD-10-CM | POA: Diagnosis present

## 2023-08-27 DIAGNOSIS — J189 Pneumonia, unspecified organism: Secondary | ICD-10-CM | POA: Diagnosis present

## 2023-08-27 DIAGNOSIS — J9811 Atelectasis: Secondary | ICD-10-CM | POA: Diagnosis present

## 2023-08-27 DIAGNOSIS — J9601 Acute respiratory failure with hypoxia: Secondary | ICD-10-CM | POA: Diagnosis present

## 2023-08-27 DIAGNOSIS — I10 Essential (primary) hypertension: Secondary | ICD-10-CM | POA: Diagnosis present

## 2023-08-27 DIAGNOSIS — N1831 Chronic kidney disease, stage 3a: Secondary | ICD-10-CM | POA: Diagnosis present

## 2023-08-27 DIAGNOSIS — Z800 Family history of malignant neoplasm of digestive organs: Secondary | ICD-10-CM

## 2023-08-27 DIAGNOSIS — Z807 Family history of other malignant neoplasms of lymphoid, hematopoietic and related tissues: Secondary | ICD-10-CM

## 2023-08-27 DIAGNOSIS — R791 Abnormal coagulation profile: Secondary | ICD-10-CM | POA: Diagnosis present

## 2023-08-27 DIAGNOSIS — L89151 Pressure ulcer of sacral region, stage 1: Secondary | ICD-10-CM | POA: Diagnosis present

## 2023-08-27 DIAGNOSIS — I2699 Other pulmonary embolism without acute cor pulmonale: Secondary | ICD-10-CM | POA: Diagnosis present

## 2023-08-27 DIAGNOSIS — I447 Left bundle-branch block, unspecified: Secondary | ICD-10-CM | POA: Diagnosis present

## 2023-08-27 DIAGNOSIS — J690 Pneumonitis due to inhalation of food and vomit: Secondary | ICD-10-CM | POA: Diagnosis present

## 2023-08-27 DIAGNOSIS — E875 Hyperkalemia: Secondary | ICD-10-CM | POA: Diagnosis present

## 2023-08-27 DIAGNOSIS — M4856XA Collapsed vertebra, not elsewhere classified, lumbar region, initial encounter for fracture: Secondary | ICD-10-CM | POA: Diagnosis present

## 2023-08-27 DIAGNOSIS — N390 Urinary tract infection, site not specified: Secondary | ICD-10-CM | POA: Diagnosis present

## 2023-08-27 DIAGNOSIS — Z87440 Personal history of urinary (tract) infections: Secondary | ICD-10-CM

## 2023-08-27 DIAGNOSIS — E872 Acidosis, unspecified: Secondary | ICD-10-CM | POA: Diagnosis present

## 2023-08-27 DIAGNOSIS — A419 Sepsis, unspecified organism: Secondary | ICD-10-CM | POA: Diagnosis present

## 2023-08-27 DIAGNOSIS — K5641 Fecal impaction: Secondary | ICD-10-CM | POA: Diagnosis present

## 2023-08-27 DIAGNOSIS — I2692 Saddle embolus of pulmonary artery without acute cor pulmonale: Secondary | ICD-10-CM

## 2023-08-27 DIAGNOSIS — R6521 Severe sepsis with septic shock: Secondary | ICD-10-CM | POA: Diagnosis present

## 2023-08-27 DIAGNOSIS — R918 Other nonspecific abnormal finding of lung field: Secondary | ICD-10-CM | POA: Diagnosis present

## 2023-08-27 DIAGNOSIS — Z882 Allergy status to sulfonamides status: Secondary | ICD-10-CM

## 2023-08-27 DIAGNOSIS — Z79890 Hormone replacement therapy (postmenopausal): Secondary | ICD-10-CM

## 2023-08-27 DIAGNOSIS — Z96612 Presence of left artificial shoulder joint: Secondary | ICD-10-CM | POA: Diagnosis present

## 2023-08-27 DIAGNOSIS — Z515 Encounter for palliative care: Secondary | ICD-10-CM | POA: Diagnosis not present

## 2023-08-27 DIAGNOSIS — Z91013 Allergy to seafood: Secondary | ICD-10-CM

## 2023-08-27 DIAGNOSIS — Z85820 Personal history of malignant melanoma of skin: Secondary | ICD-10-CM

## 2023-08-27 DIAGNOSIS — Z86711 Personal history of pulmonary embolism: Secondary | ICD-10-CM

## 2023-08-27 DIAGNOSIS — R680 Hypothermia, not associated with low environmental temperature: Secondary | ICD-10-CM | POA: Diagnosis present

## 2023-08-27 DIAGNOSIS — Z886 Allergy status to analgesic agent status: Secondary | ICD-10-CM

## 2023-08-27 DIAGNOSIS — I252 Old myocardial infarction: Secondary | ICD-10-CM

## 2023-08-27 DIAGNOSIS — Z66 Do not resuscitate: Secondary | ICD-10-CM | POA: Diagnosis present

## 2023-08-27 DIAGNOSIS — R911 Solitary pulmonary nodule: Secondary | ICD-10-CM | POA: Diagnosis present

## 2023-08-27 DIAGNOSIS — M810 Age-related osteoporosis without current pathological fracture: Secondary | ICD-10-CM | POA: Diagnosis present

## 2023-08-27 DIAGNOSIS — G4710 Hypersomnia, unspecified: Secondary | ICD-10-CM | POA: Diagnosis present

## 2023-08-27 DIAGNOSIS — K922 Gastrointestinal hemorrhage, unspecified: Secondary | ICD-10-CM | POA: Diagnosis present

## 2023-08-27 DIAGNOSIS — Z7901 Long term (current) use of anticoagulants: Secondary | ICD-10-CM

## 2023-08-27 DIAGNOSIS — G9341 Metabolic encephalopathy: Secondary | ICD-10-CM | POA: Diagnosis present

## 2023-08-27 DIAGNOSIS — Z803 Family history of malignant neoplasm of breast: Secondary | ICD-10-CM

## 2023-08-27 LAB — COMPREHENSIVE METABOLIC PANEL WITH GFR
ALT: 13 U/L (ref 0–44)
ALT: 14 U/L (ref 0–44)
AST: 32 U/L (ref 15–41)
AST: 32 U/L (ref 15–41)
Albumin: 2.8 g/dL — ABNORMAL LOW (ref 3.5–5.0)
Albumin: 2.5 g/dL — ABNORMAL LOW (ref 3.5–5.0)
Alkaline Phosphatase: 61 U/L (ref 38–126)
Alkaline Phosphatase: 55 U/L (ref 38–126)
Anion gap: 18 — ABNORMAL HIGH (ref 5–15)
Anion gap: 20 — ABNORMAL HIGH (ref 5–15)
BUN: 118 mg/dL — ABNORMAL HIGH (ref 8–23)
BUN: 113 mg/dL — ABNORMAL HIGH (ref 8–23)
CO2: 11 mmol/L — ABNORMAL LOW (ref 22–32)
CO2: 17 mmol/L — ABNORMAL LOW (ref 22–32)
Calcium: 11 mg/dL — ABNORMAL HIGH (ref 8.9–10.3)
Calcium: 10.9 mg/dL — ABNORMAL HIGH (ref 8.9–10.3)
Chloride: 114 mmol/L — ABNORMAL HIGH (ref 98–111)
Chloride: 109 mmol/L (ref 98–111)
Creatinine, Ser: 2.97 mg/dL — ABNORMAL HIGH (ref 0.44–1.00)
Creatinine, Ser: 3.01 mg/dL — ABNORMAL HIGH (ref 0.44–1.00)
GFR, Estimated: 14 mL/min — ABNORMAL LOW (ref >60)
GFR, Estimated: 13 mL/min — ABNORMAL LOW (ref >60)
Glucose, Bld: 122 mg/dL — ABNORMAL HIGH (ref 70–99)
Glucose, Bld: 211 mg/dL — ABNORMAL HIGH (ref 70–99)
Potassium: 5.9 mmol/L — ABNORMAL HIGH (ref 3.5–5.1)
Potassium: 4.9 mmol/L (ref 3.5–5.1)
Sodium: 143 mmol/L (ref 135–145)
Sodium: 146 mmol/L — ABNORMAL HIGH (ref 135–145)
Total Bilirubin: 1 mg/dL (ref 0.0–1.2)
Total Bilirubin: 0.7 mg/dL (ref 0.0–1.2)
Total Protein: 6.6 g/dL (ref 6.5–8.1)
Total Protein: 6.1 g/dL — ABNORMAL LOW (ref 6.5–8.1)

## 2023-08-27 LAB — CBC WITH DIFFERENTIAL/PLATELET
Abs Immature Granulocytes: 0 10*3/uL (ref 0.00–0.07)
Basophils Absolute: 0 10*3/uL (ref 0.0–0.1)
Basophils Relative: 0 %
Eosinophils Absolute: 0 10*3/uL (ref 0.0–0.5)
Eosinophils Relative: 0 %
HCT: 39.4 % (ref 36.0–46.0)
Hemoglobin: 11.9 g/dL — ABNORMAL LOW (ref 12.0–15.0)
Lymphocytes Relative: 5 %
Lymphs Abs: 1.5 10*3/uL (ref 0.7–4.0)
MCH: 34.6 pg — ABNORMAL HIGH (ref 26.0–34.0)
MCHC: 30.2 g/dL (ref 30.0–36.0)
MCV: 114.5 fL — ABNORMAL HIGH (ref 80.0–100.0)
Monocytes Absolute: 2.6 10*3/uL — ABNORMAL HIGH (ref 0.1–1.0)
Monocytes Relative: 9 %
Neutro Abs: 25 10*3/uL — ABNORMAL HIGH (ref 1.7–7.7)
Neutrophils Relative %: 86 %
Platelets: 468 10*3/uL — ABNORMAL HIGH (ref 150–400)
RBC: 3.44 MIL/uL — ABNORMAL LOW (ref 3.87–5.11)
RDW: 14.1 % (ref 11.5–15.5)
WBC: 29.1 10*3/uL — ABNORMAL HIGH (ref 4.0–10.5)
nRBC: 0 % (ref 0.0–0.2)
nRBC: 0 /100{WBCs}

## 2023-08-27 LAB — I-STAT VENOUS BLOOD GAS, ED
Acid-base deficit: 7 mmol/L — ABNORMAL HIGH (ref 0.0–2.0)
Bicarbonate: 18.3 mmol/L — ABNORMAL LOW (ref 20.0–28.0)
Calcium, Ion: 1.34 mmol/L (ref 1.15–1.40)
HCT: 30 % — ABNORMAL LOW (ref 36.0–46.0)
Hemoglobin: 10.2 g/dL — ABNORMAL LOW (ref 12.0–15.0)
O2 Saturation: 55 %
Potassium: 5.3 mmol/L — ABNORMAL HIGH (ref 3.5–5.1)
Sodium: 145 mmol/L (ref 135–145)
TCO2: 19 mmol/L — ABNORMAL LOW (ref 22–32)
pCO2, Ven: 37.1 mmHg — ABNORMAL LOW (ref 44–60)
pH, Ven: 7.302 (ref 7.25–7.43)
pO2, Ven: 32 mmHg (ref 32–45)

## 2023-08-27 LAB — TSH: TSH: 1.566 u[IU]/mL (ref 0.350–4.500)

## 2023-08-27 LAB — URINALYSIS, W/ REFLEX TO CULTURE (INFECTION SUSPECTED)
Bilirubin Urine: NEGATIVE
Glucose, UA: NEGATIVE mg/dL
Ketones, ur: NEGATIVE mg/dL
Nitrite: NEGATIVE
Protein, ur: >=300 mg/dL — AB
Specific Gravity, Urine: 1.015 (ref 1.005–1.030)
WBC, UA: >50 WBC/hpf (ref 0–5)
pH: 8.5 — ABNORMAL HIGH (ref 5.0–8.0)

## 2023-08-27 LAB — I-STAT CHEM 8, ED
BUN: 107 mg/dL — ABNORMAL HIGH (ref 8–23)
BUN: 105 mg/dL — ABNORMAL HIGH (ref 8–23)
Calcium, Ion: 1.31 mmol/L (ref 1.15–1.40)
Calcium, Ion: 1.32 mmol/L (ref 1.15–1.40)
Chloride: 112 mmol/L — ABNORMAL HIGH (ref 98–111)
Chloride: 114 mmol/L — ABNORMAL HIGH (ref 98–111)
Creatinine, Ser: 3.1 mg/dL — ABNORMAL HIGH (ref 0.44–1.00)
Creatinine, Ser: 3 mg/dL — ABNORMAL HIGH (ref 0.44–1.00)
Glucose, Bld: 130 mg/dL — ABNORMAL HIGH (ref 70–99)
Glucose, Bld: 131 mg/dL — ABNORMAL HIGH (ref 70–99)
HCT: 30 % — ABNORMAL LOW (ref 36.0–46.0)
HCT: 31 % — ABNORMAL LOW (ref 36.0–46.0)
Hemoglobin: 10.2 g/dL — ABNORMAL LOW (ref 12.0–15.0)
Hemoglobin: 10.5 g/dL — ABNORMAL LOW (ref 12.0–15.0)
Potassium: 5.3 mmol/L — ABNORMAL HIGH (ref 3.5–5.1)
Potassium: 5.3 mmol/L — ABNORMAL HIGH (ref 3.5–5.1)
Sodium: 144 mmol/L (ref 135–145)
Sodium: 144 mmol/L (ref 135–145)
TCO2: 18 mmol/L — ABNORMAL LOW (ref 22–32)
TCO2: 19 mmol/L — ABNORMAL LOW (ref 22–32)

## 2023-08-27 LAB — CBC
HCT: 34.2 % — ABNORMAL LOW (ref 36.0–46.0)
Hemoglobin: 11 g/dL — ABNORMAL LOW (ref 12.0–15.0)
MCH: 34.5 pg — ABNORMAL HIGH (ref 26.0–34.0)
MCHC: 32.2 g/dL (ref 30.0–36.0)
MCV: 107.2 fL — ABNORMAL HIGH (ref 80.0–100.0)
Platelets: 461 10*3/uL — ABNORMAL HIGH (ref 150–400)
RBC: 3.19 MIL/uL — ABNORMAL LOW (ref 3.87–5.11)
RDW: 14.1 % (ref 11.5–15.5)
WBC: 29.1 10*3/uL — ABNORMAL HIGH (ref 4.0–10.5)
nRBC: 0 % (ref 0.0–0.2)

## 2023-08-27 LAB — BLOOD GAS, VENOUS
Acid-base deficit: 6.8 mmol/L — ABNORMAL HIGH (ref 0.0–2.0)
Bicarbonate: 20.2 mmol/L (ref 20.0–28.0)
O2 Saturation: 29.1 %
Patient temperature: 35.7
pCO2, Ven: 42 mmHg — ABNORMAL LOW (ref 44–60)
pH, Ven: 7.28 (ref 7.25–7.43)
pO2, Ven: <31 mmHg — CL (ref 32–45)

## 2023-08-27 LAB — LACTIC ACID, PLASMA
Lactic Acid, Venous: 6.1 mmol/L (ref 0.5–1.9)
Lactic Acid, Venous: 7.4 mmol/L (ref 0.5–1.9)

## 2023-08-27 LAB — VITAMIN B12: Vitamin B-12: 643 pg/mL (ref 180–914)

## 2023-08-27 LAB — I-STAT CG4 LACTIC ACID, ED
Lactic Acid, Venous: 4.7 mmol/L (ref 0.5–1.9)
Lactic Acid, Venous: 5.5 mmol/L (ref 0.5–1.9)

## 2023-08-27 LAB — LIPASE, BLOOD: Lipase: 26 U/L (ref 11–51)

## 2023-08-27 LAB — TYPE AND SCREEN
ABO/RH(D): A POS
Antibody Screen: NEGATIVE

## 2023-08-27 LAB — PROTIME-INR
INR: 2.6 — ABNORMAL HIGH (ref 0.8–1.2)
Prothrombin Time: 28.3 s — ABNORMAL HIGH (ref 11.4–15.2)

## 2023-08-27 LAB — AMMONIA: Ammonia: 29 umol/L (ref 9–35)

## 2023-08-27 LAB — T4, FREE: Free T4: 1.21 ng/dL — ABNORMAL HIGH (ref 0.61–1.12)

## 2023-08-27 LAB — HIV ANTIBODY (ROUTINE TESTING W REFLEX): HIV Screen 4th Generation wRfx: NONREACTIVE

## 2023-08-27 LAB — HEMOGLOBIN AND HEMATOCRIT, BLOOD
HCT: 33.6 % — ABNORMAL LOW (ref 36.0–46.0)
Hemoglobin: 10.6 g/dL — ABNORMAL LOW (ref 12.0–15.0)

## 2023-08-27 LAB — POC OCCULT BLOOD, ED: Fecal Occult Bld: POSITIVE — AB

## 2023-08-27 MED ORDER — LACTATED RINGERS IV BOLUS
1000.0000 mL | Freq: Once | INTRAVENOUS | Status: AC
  Administered 2023-08-27: 1000 mL via INTRAVENOUS

## 2023-08-27 MED ORDER — SODIUM CHLORIDE 0.9 % IV SOLN: 1.0000 g | INTRAVENOUS | Status: DC

## 2023-08-27 MED ORDER — CALCIUM GLUCONATE 10 % IV SOLN
1.0000 g | Freq: Once | INTRAVENOUS | Status: AC
  Administered 2023-08-27: 1 g via INTRAVENOUS
  Filled 2023-08-27: qty 10

## 2023-08-27 MED ORDER — INSULIN ASPART 100 UNIT/ML IV SOLN: 5.0000 [IU] | Freq: Once | INTRAVENOUS | Status: DC

## 2023-08-27 MED ORDER — DEXTROSE 50 % IV SOLN
1.0000 | Freq: Once | INTRAVENOUS | Status: AC
  Administered 2023-08-27: 50 mL via INTRAVENOUS
  Filled 2023-08-27: qty 50

## 2023-08-27 MED ORDER — INSULIN ASPART 100 UNIT/ML IV SOLN
2.0000 [IU] | Freq: Once | INTRAVENOUS | Status: AC
  Administered 2023-08-27: 2 [IU] via INTRAVENOUS

## 2023-08-27 MED ORDER — PIPERACILLIN-TAZOBACTAM IN DEX 2-0.25 GM/50ML IV SOLN
2.2500 g | Freq: Three times a day (TID) | INTRAVENOUS | Status: DC
  Administered 2023-08-27: 2.25 g via INTRAVENOUS
  Filled 2023-08-27 (×2): qty 50

## 2023-08-27 MED ORDER — SODIUM ZIRCONIUM CYCLOSILICATE 10 G PO PACK
10.0000 g | PACK | Freq: Three times a day (TID) | ORAL | Status: DC
  Administered 2023-08-27: 10 g via ORAL
  Filled 2023-08-27: qty 1

## 2023-08-27 MED ORDER — LEVOTHYROXINE SODIUM 75 MCG PO TABS: 75.0000 ug | ORAL_TABLET | Freq: Every day | ORAL | Status: DC

## 2023-08-27 MED ORDER — MIDODRINE HCL 5 MG PO TABS
10.0000 mg | ORAL_TABLET | ORAL | Status: AC
  Administered 2023-08-27: 10 mg via ORAL
  Filled 2023-08-27: qty 2

## 2023-08-27 MED ORDER — SODIUM CHLORIDE 0.9 % IV BOLUS: 1000.0000 mL | INTRAVENOUS | Status: DC

## 2023-08-27 MED ORDER — SODIUM CHLORIDE 0.9 % IV SOLN
500.0000 mg | Freq: Once | INTRAVENOUS | Status: AC
  Administered 2023-08-27: 500 mg via INTRAVENOUS
  Filled 2023-08-27: qty 5

## 2023-08-27 MED ORDER — SODIUM CHLORIDE 0.9 % IV SOLN: 3.0000 g | Freq: Once | INTRAVENOUS | Status: DC

## 2023-08-27 MED ORDER — LACTATED RINGERS IV BOLUS: 500.0000 mL | Freq: Once | INTRAVENOUS | Status: DC

## 2023-08-27 MED ORDER — ONDANSETRON HCL 4 MG/2ML IJ SOLN: 4.0000 mg | Freq: Four times a day (QID) | INTRAMUSCULAR | Status: DC | PRN

## 2023-08-27 MED ORDER — SODIUM CHLORIDE 0.9 % IV SOLN: INTRAVENOUS | Status: AC

## 2023-08-27 MED ORDER — SODIUM CHLORIDE 0.9 % IV SOLN
1.0000 g | Freq: Once | INTRAVENOUS | Status: AC
  Administered 2023-08-27: 1 g via INTRAVENOUS
  Filled 2023-08-27: qty 10

## 2023-08-27 MED ORDER — SODIUM BICARBONATE 8.4 % IV SOLN
50.0000 meq | Freq: Once | INTRAVENOUS | Status: AC
  Administered 2023-08-27: 50 meq via INTRAVENOUS
  Filled 2023-08-27: qty 50

## 2023-08-27 MED ORDER — VANCOMYCIN HCL IN DEXTROSE 1-5 GM/200ML-% IV SOLN
1000.0000 mg | Freq: Once | INTRAVENOUS | Status: AC
  Administered 2023-08-27: 1000 mg via INTRAVENOUS
  Filled 2023-08-27: qty 200

## 2023-08-27 MED ORDER — SODIUM CHLORIDE 0.9 % IV SOLN
500.0000 mg | INTRAVENOUS | Status: DC
  Administered 2023-08-27: 500 mg via INTRAVENOUS
  Filled 2023-08-27 (×2): qty 10

## 2023-08-27 MED ORDER — SODIUM CHLORIDE 0.9 % IV SOLN: 500.0000 mg | INTRAVENOUS | Status: DC

## 2023-08-27 MED ORDER — SODIUM BICARBONATE 650 MG PO TABS
650.0000 mg | ORAL_TABLET | Freq: Three times a day (TID) | ORAL | Status: DC
  Administered 2023-08-27: 650 mg via ORAL
  Filled 2023-08-27: qty 1

## 2023-08-27 MED ORDER — LACTATED RINGERS IV BOLUS (SEPSIS)
350.0000 mL | Freq: Once | INTRAVENOUS | Status: AC
  Administered 2023-08-27: 350 mL via INTRAVENOUS

## 2023-08-27 MED ORDER — SODIUM CHLORIDE 0.9 % IV BOLUS
250.0000 mL | INTRAVENOUS | Status: AC
  Administered 2023-08-27: 250 mL via INTRAVENOUS

## 2023-08-27 MED ORDER — PANTOPRAZOLE SODIUM 40 MG IV SOLR
40.0000 mg | Freq: Two times a day (BID) | INTRAVENOUS | Status: DC
  Administered 2023-08-27 – 2023-08-28 (×2): 40 mg via INTRAVENOUS
  Filled 2023-08-27 (×2): qty 10

## 2023-08-27 MED ORDER — PANTOPRAZOLE SODIUM 40 MG IV SOLR
80.0000 mg | Freq: Once | INTRAVENOUS | Status: AC
  Administered 2023-08-27: 80 mg via INTRAVENOUS
  Filled 2023-08-27: qty 20

## 2023-08-27 MED ORDER — BISACODYL 10 MG RE SUPP
10.0000 mg | Freq: Once | RECTAL | Status: AC
  Administered 2023-08-27: 10 mg via RECTAL
  Filled 2023-08-27: qty 1

## 2023-08-27 MED ORDER — DEXTROSE 50 % IV SOLN: 1.0000 | Freq: Once | INTRAVENOUS | Status: DC

## 2023-08-27 MED ORDER — VANCOMYCIN VARIABLE DOSE PER UNSTABLE RENAL FUNCTION (PHARMACIST DOSING): Status: DC

## 2023-08-27 NOTE — Progress Notes (Addendum)
 Dr. Del Favia was notified of patient lactic acid level of 6.1.

## 2023-08-27 NOTE — Progress Notes (Addendum)
 Received a call from bedside RN regarding abnormal lab results, lactic acid 6.1 from 5.5.    Presented at bedside.  The patient is hypersomnolent however arouses to voice.  Rhonchorous sounds noted on lungs auscultation.    IV fluid NS bolus 250 cc x 1 and maintenance IV fluid NS at 75 cc/h x 8 hours ordered to be administered.  Midodrine 10 mg x 1 added to maintain MAP greater than 65.    To note, IV fluid ordered judiciously to avoid volume overload and avoid acute respiratory distress.  The patient is currently requiring 4 L O2 supplementation.  Will continue to closely monitor and treat as indicated.   Time: 15 minutes.

## 2023-08-27 NOTE — ED Provider Notes (Signed)
 [name] AT [facility] Provider Note  History  Chief Complaint:  Code Sepsis  The history is provided by the EMS personnel and a relative.     Cindy [name] is a 89 y.o. female with a history of arthritis, hypertension, GERD who presents the emergency department for concern for UTI and vomiting.  The staff at the facility states that she vomited and aspirated some of the emesis today.  They were concerned about her mental status as well as worsening infection therefore they sent the patient to the emergency department.  On arrival of EMS to scene the patient was noted to be hypotensive.  Patient was given fluids en route with EMS which returned BP to normal.  On interview patient states she is not having any pain.  I discussed with the family regarding patient.  They state that the patient is DNR limited.  Therefore she did not want to be intubated or have CPR.  This was communicated to the staff.  Past Medical History:  Diagnosis Date   Arthritis    Chronic renal insufficiency, stage III (moderate) (HCC)    Depression    Dyslipidemia    Forgetfulness    GERD (gastroesophageal reflux disease)    takes Omeprazole for Hital Hernia    Hiatal hernia    History of nuclear stress test 2005   persantine; inferoapical ischmeia w/ EF 83%; no CAD at cath   [facility] (hard of hearing)    hearing aids   Hypertension    Hypothyroidism    LBBB (left bundle branch block)    chronic per Dr. Katheryne Pane   Lung nodule 07/01/2003   right middle lobe   Macular degeneration    Malignant melanoma (HCC) 11/12/2004   L upper arm, MOHS_ right hand, leg   Murmur, cardiac    NSTEMI (non-ST elevated myocardial infarction) (HCC) 03/05/2019   Non-STEMI   Osteoporosis    Pneumonia 06/2018   Pulmonary embolism (HCC) 11/16/2019   Pulmonary embolism    Past Surgical History:  Procedure Laterality Date   BLADDER REPAIR     CARDIAC CATHETERIZATION  2005/2007   normal coronaries     CATARACT EXTRACTION     bilat   coccyx  1951   excision   COLONOSCOPY     knee     right cartilage repair   REVERSE SHOULDER ARTHROPLASTY Left 08/04/2018   Procedure: REVERSE LEFT SHOULDER ARTHROPLASTY;  Surgeon: Winston Hawking, MD;  Location: [facility] OR;  Service: Orthopedics;  Laterality: Left;   ROTATOR CUFF REPAIR     right   TOTAL ABDOMINAL HYSTERECTOMY W/ BILATERAL SALPINGOOPHORECTOMY     TRANSTHORACIC ECHOCARDIOGRAM  2007   EF=>55%; borderline conc LVH; LA mod dilated; RA mild-mod dilated; mild TR; mild AVR with mildly sclerotic AV leavlefts; mild PV regurg     Family History  Problem Relation Age of Onset   Cancer Father        NHL   Cancer Maternal Grandmother        rectal   Cancer Daughter        breast   Cancer Maternal Aunt        breast   Cancer Son        melanoma    Social History   Tobacco Use   Smoking status: Never   Smokeless tobacco: Never  Vaping Use   Vaping status: Never Used  Substance Use Topics   Alcohol  use: No   Drug use: No  Review of Systems  Review of Systems   Reviewed and documented in HPI if pertinent.   Physical Exam   ED Triage Vitals  Encounter Vitals Group     BP 08/27/23 1510 (!) 148/103     Systolic BP Percentile --      Diastolic BP Percentile --      Pulse Rate 08/27/23 1510 89     Resp 08/27/23 1545 18     Temp 08/27/23 1510 (!) 96.5 F (35.8 C)     Temp Source 08/27/23 1510 Rectal     SpO2 08/27/23 1510 100 %     Weight 08/27/23 1815 100 lb (45.4 kg)     Height 08/27/23 1815 5\' 5"  (1.651 m)     Head Circumference --      Peak Flow --      Pain Score 08/27/23 1459 0     Pain Loc --      Pain Education --      Exclude from Growth Chart --      Physical Exam Vitals and nursing note reviewed.  Constitutional:      General: She is not in acute distress.    Appearance: She is well-developed. She is ill-appearing. She is not toxic-appearing.  HENT:     Head: Normocephalic and atraumatic.  Eyes:      Conjunctiva/sclera: Conjunctivae normal.  Cardiovascular:     Rate and Rhythm: Normal rate and regular rhythm.     Heart sounds: No murmur heard. Pulmonary:     Effort: Pulmonary effort is normal. No respiratory distress.     Breath sounds: Normal breath sounds.  Abdominal:     General: There is no distension.     Palpations: Abdomen is soft.     Tenderness: There is no abdominal tenderness.  Musculoskeletal:        General: No swelling.     Cervical back: Neck supple.     Right lower leg: No edema.     Left lower leg: No edema.  Skin:    General: Skin is warm and dry.     Capillary Refill: Capillary refill takes less than 2 seconds.  Neurological:     Mental Status: She is alert. She is disoriented.      Procedures   Procedures  ED Course - Medical Decision Making  Brief Overview Cindy [name] is a 89 y.o. female who presents as per above.  I have reviewed the nursing documentation for past medical history, family history, and social history and agree.  I have reviewed the patient's vital signs. Hypothermic.  Initial Differential Diagnoses: I am primarily concerned for sepsis, acute renal failure, electrolyte abnormalities, pneumonia, UTI.  Therapies: These medications and interventions were provided for the patient while in the ED.  Medications  ondansetron  (ZOFRAN ) injection 4 mg (has no administration in time range)  pantoprazole  (PROTONIX ) injection 40 mg (has no administration in time range)  azithromycin  (ZITHROMAX ) 500 mg in sodium chloride  0.9 % 250 mL IVPB (0 mg Intravenous Hold 08/27/23 1918)  vancomycin (VANCOCIN) IVPB 1000 mg/200 mL premix (has no administration in time range)  bisacodyl  (DULCOLAX) suppository 10 mg (has no administration in time range)  levothyroxine  (SYNTHROID ) tablet 75 mcg (has no administration in time range)  sodium zirconium cyclosilicate (LOKELMA) packet 10 g (has no administration in time range)  vancomycin variable dose per  unstable renal function (pharmacist dosing) (has no administration in time range)  meropenem (MERREM) 500 mg in sodium chloride  0.9 % 100  mL IVPB (has no administration in time range)  sodium bicarbonate tablet 650 mg (has no administration in time range)  lactated ringers  bolus 350 mL (0 mLs Intravenous Stopped 08/27/23 1618)  cefTRIAXone  (ROCEPHIN ) 1 g in sodium chloride  0.9 % 100 mL IVPB (0 g Intravenous Stopped 08/27/23 1634)  azithromycin  (ZITHROMAX ) 500 mg in sodium chloride  0.9 % 250 mL IVPB (0 mg Intravenous Stopped 08/27/23 1658)  calcium  gluconate inj 10% (1 g) URGENT USE ONLY! (1 g Intravenous Given 08/27/23 1655)  sodium bicarbonate injection 50 mEq (50 mEq Intravenous Given 08/27/23 1639)  insulin aspart (novoLOG) injection 2 Units (2 Units Intravenous Given 08/27/23 1654)    And  dextrose  50 % solution 50 mL (50 mLs Intravenous Given 08/27/23 1644)  lactated ringers  bolus 1,000 mL (1,000 mLs Intravenous New Bag/Given 08/27/23 1853)  pantoprazole  (PROTONIX ) injection 80 mg (80 mg Intravenous Given 08/27/23 1853)    Testing Results: On my interpretation labs are significant for : Leukocytosis Lactic acidosis UA with concern for infection  On my interpretation imaging is significant for: CT head without intracranial normality CT chest abdomen pelvis evidence of right lower lobe consolidation  EKG Interpretation Date/Time:  Saturday Aug 27 2023 15:36:20 EDT Ventricular Rate:  94 PR Interval:  209 QRS Duration:  133 QT Interval:  379 QTC Calculation: 474 R Axis:   -60  Text Interpretation: Sinus rhythm Multiple premature complexes, vent & supraven Borderline prolonged PR interval Left bundle branch block No significant change since last tracing Confirmed by Florette Hurry [phone]) on 08/27/2023 4:41:07 PM   See the EMR for full details regarding lab and imaging results.   Medical Decision Making Pt is a 89 y.o. female with pertinent PMHX as above who presents w/ hypothermia, AMS.    SIRS criteria met by abnormalities including hypothermia, leukocytosis . Suspected infection, source pneumonia or UTI. Code Sepsis was called immediately after vital sign / lab abnormalities c/w SIRS with suspected source of infection. Labs, cultures, IVFs, antibiotics ordered once Code Sepsis called.  Labs performed and resulted above. Pertinent lab findings include leukocytosis, lactic acidosis, UA concerning for infection. Imaging abnormalities include consolidation right lower lobe. Antibiotics were started on this patient. Blood cultures x2 were drawn prior to infusion of antibiotics.   Patient also had evidence of dark stool on exam.  Hemoccult positive.  Patient also vomited dark emesis which appeared to be digested blood products.  Therefore patient was started on Protonix .  Hemoglobin stable.  Patient is also hypoxic here in the emergency department.  Likely from her pneumonia.  Patient does have a history of pulmonary embolism.  Likely will require empiric treatment.  Did not initiate here in the emergency department given concern for GI bleed.  Will require a risk-benefit discussion.  Patient admitted to PCU in critical condition.  Patient is having up trending lactate.  Considered ICU admission however patient does have a MOST form which states to avoid intensive care unit.  Does state that she would like antibiotics and fluid resuscitation.  Problems Addressed: Sepsis with acute renal failure without septic shock, due to unspecified organism, unspecified acute renal failure type [facility]): acute illness or injury that poses a threat to life or bodily functions  Amount and/or Complexity of Data Reviewed Labs: ordered. Radiology: ordered.  Risk OTC drugs. Prescription drug management. Decision regarding hospitalization.     ### All radiography studies, electrocardiograms, and laboratory data were personally reviewed by me and incorporated into my medical decision  making. Impression  1. Sepsis with acute renal failure without septic shock, due to unspecified organism, unspecified acute renal failure type [facility])      Note: Dragon medical dictation software was used in the creation of this note.     Arminda Landmark, MD 09/04/2023 1610    Dalene Duck, MD 08/12/2023 [phone]

## 2023-08-27 NOTE — ED Notes (Signed)
 Pt transported to CT ?

## 2023-08-27 NOTE — Assessment & Plan Note (Signed)
  Acute kidney injury on chronic kidney disease stage IIIa Baseline creatinine 1.1 increased to 3 today No evidence of postobstructive pattern on the patient's CT abdomen pathology however suspect this is primary due to the sepsis Nephrology has been consulted we are providing crystalloid and holding nephrotoxins, will follow creatinine

## 2023-08-27 NOTE — Assessment & Plan Note (Addendum)
   Elevated INR Secondary to the apixaban  interference,  holding No indication for reversal at this time given hemodynamically stable

## 2023-08-27 NOTE — Assessment & Plan Note (Signed)
 Suspected pulmonary embolism Patient had admission in January with suspected acute on chronic pulmonary embolism given her GI bleed and presenting with hypotension underwent detailed shared decision-making with the patient's son via telephone given the patient could not participate explaining the risk of discontinuing the anticoagulant would be worsening pulmonary embolism and possible cardiopulmonary compromise from hemodynamically significant PE versus further GI bleeding.  Patient's son believes the initial diagnosis of acute on chronic PE was equivocal and would like to follow hemoglobin off of the [facility].  Accordingly have held the Eliquis , ordered echocardiogram to review for any hemodynamically significant PE/RV strain and needs short interval show decision making for empiric heparin  if hemoglobin remains stable given her age, clinical status and comorbidities she is unlikely to be an endoscopy candidate

## 2023-08-27 NOTE — ED Notes (Signed)
Patient transferred to floor at this time.

## 2023-08-27 NOTE — Progress Notes (Signed)
 Dr. Del Favia was made aware than patient PO2 Venous blood gas was less than 31.

## 2023-08-27 NOTE — Assessment & Plan Note (Signed)
  Hyperkalemia Potassium is 5.3 ECG 12-lead independently reviewed and interpreted she has a left bundle branch block with resultant ST changes unchanged from April 30, 2022 ventricular rate is in the low 90s QTc 474 T wave is peaked received both insulin as well as calcium  in the emergency department have started Lokelma and consultation with nephrology and ordered serial BMP to follow, telemetry

## 2023-08-27 NOTE — ED Notes (Signed)
 Pt returned from CT. When this paramedic and phlebotomist went to attempt blood work, pt had black liquid vomit on blanket and chin. Pt cleaned. EDP notified.

## 2023-08-27 NOTE — H&P (Addendum)
 History and Physical    Patient: Cindy [name] WNU:[id number] DOB: [birth year] DOA: 08/27/2023 DOS: the patient was seen and examined on 08/27/2023 PCP: Azalia Leo, MD  Patient coming from: SNF  Chief Complaint:  Chief Complaint  Patient presents with   Code Sepsis   HPI: 89 year old female who came from [place] facility with a past medical history of pulmonary embolism L2 compression fracture chronic kidney disease stage IIIa, hypertension and hypothyroidism presented with decreased level of consciousness concern for UTI as well as nausea and vomiting.  Of note upon review of the patient's nursing home records she was on doxycycline  from 5/7 onwards for pulmonary symptoms and presumed pneumonia, the history was confirmed via telephone with the patient's son.  The patient cannot provide any independent history but there is no history of other localizing symptoms.  Past medical records reviewed and summarized the patient's discharge summary from May 06, 2022: History of PE concern for acute on chronic PE at that time patient on Eliquis , dose increased at that time as well as an L2 to fracture  Echo 2021 grade 1 diastolic dysfunction EF 65%  CT: Patchy right lower lobe ground-glass airspace disease which may reflect infection or aspiration. 2. Large hiatal hernia, with compressive left lower lobe atelectasis and trace left pleural effusion unchanged. 3. Stable 15 mm right middle lobe pulmonary nodule, which can be considered benign given long-term stability. No specific follow-up recommended. 4. Distal colonic diverticulosis without evidence of diverticulitis. 5. Large amount of retained stool within the colon, which may reflect fecal impaction. No bowel obstruction or ileus. 6. Cholelithiasis without cholecystitis. 7.  Aortic Atherosclerosis (ICD10-I70.0). 8. Age indeterminate compression deformity inferior endplate T9 vertebral body, with less than 10% loss of height. This is new  since the 2024 exam, with imaging characteristics suggesting subacute to chronic fracture. Progressive loss of height at a chronic L2 compression fracture as above.  Past urinary specimen E. coli ESBL: 60,000 COLONIES/mL ESCHERICHIA COLI  Confirmed Extended Spectrum Beta-Lactamase Producer (ESBL).  In bloodstream infections from ESBL organisms, carbapenems are preferred over piperacillin/tazobactam. They are shown to have a lower risk of mortality.     Review of Systems: Cannot Obtain given mental status Past Medical History:  Diagnosis Date   Arthritis    Chronic renal insufficiency, stage III (moderate) (HCC)    Depression    Dyslipidemia    Forgetfulness    GERD (gastroesophageal reflux disease)    takes Omeprazole for Hital Hernia    Hiatal hernia    History of nuclear stress test 2005   persantine; inferoapical ischmeia w/ EF 83%; no CAD at cath   [facility] (hard of hearing)    hearing aids   Hypertension    Hypothyroidism    LBBB (left bundle branch block)    chronic per Dr. Katheryne Pane   Lung nodule 07/01/2003   right middle lobe   Macular degeneration    Malignant melanoma (HCC) 11/12/2004   L upper arm, MOHS_ right hand, leg   Murmur, cardiac    NSTEMI (non-ST elevated myocardial infarction) (HCC) 03/05/2019   Non-STEMI   Osteoporosis    Pneumonia 06/2018   Pulmonary embolism (HCC) 11/16/2019   Pulmonary embolism   Past Surgical History:  Procedure Laterality Date   BLADDER REPAIR     CARDIAC CATHETERIZATION  2005/2007   normal coronaries    CATARACT EXTRACTION     bilat   coccyx  1951   excision   COLONOSCOPY     knee  right cartilage repair   REVERSE SHOULDER ARTHROPLASTY Left 08/04/2018   Procedure: REVERSE LEFT SHOULDER ARTHROPLASTY;  Surgeon: Winston Hawking, MD;  Location: [facility] OR;  Service: Orthopedics;  Laterality: Left;   ROTATOR CUFF REPAIR     right   TOTAL ABDOMINAL HYSTERECTOMY W/ BILATERAL SALPINGOOPHORECTOMY     TRANSTHORACIC ECHOCARDIOGRAM  2007    EF=>55%; borderline conc LVH; LA mod dilated; RA mild-mod dilated; mild TR; mild AVR with mildly sclerotic AV leavlefts; mild PV regurg    Social History:  reports that she has never smoked. She has never used smokeless tobacco. She reports that she does not drink alcohol  and does not use drugs.  Allergies  Allergen Reactions   Fish Allergy Hives, Nausea And Vomiting, Swelling and Other (See Comments)    Redness    Diclofenac  Other (See Comments)    Not allergic per caregiver    Ibuprofen  Other (See Comments)    Affects kidney function   Sulfasalazine Other (See Comments)    Unknown    Sulfa Antibiotics     Unknown     Family History  Problem Relation Age of Onset   Cancer Father        NHL   Cancer Maternal Grandmother        rectal   Cancer Daughter        breast   Cancer Maternal Aunt        breast   Cancer Son        melanoma    Prior to Admission medications   Medication Sig Start Date End Date Taking? Authorizing Provider  acetaminophen  (TYLENOL ) 325 MG tablet Take 1-2 tablets (325-650 mg total) by mouth every 6 (six) hours as needed for mild pain or moderate pain (pain score 1-3 or temp > 100.5). Patient taking differently: Take 325 mg by mouth in the morning and at bedtime. 08/07/18  Yes Winston Hawking, MD  apixaban  (ELIQUIS ) 2.5 MG TABS tablet Take 2.5 mg by mouth 2 (two) times daily.   Yes [provider]  Calcium  Carb-Cholecalciferol 600-20 MG-MCG TABS Take 1 tablet by mouth in the morning.   Yes [provider]  Emollient (CERAVE EX) Apply 1 Application topically in the morning and at bedtime.   Yes [provider]  levothyroxine  (SYNTHROID ) 50 MCG tablet Take 75 mcg by mouth daily before breakfast. 07/01/16  Yes [provider]  lidocaine  (LIDODERM ) 5 % Place 1 patch onto the skin daily. Remove & Discard patch within 12 hours or as directed by MD 05/07/22  Yes Lesa Rape, MD  lisinopril  (ZESTRIL ) 10 MG tablet Take 10 mg by mouth  in the morning.   Yes [provider]  loperamide (IMODIUM A-D) 2 MG tablet Take 2 mg by mouth 3 (three) times daily as needed for diarrhea or loose stools.   Yes [provider]  mirtazapine (REMERON) 15 MG tablet Take 15 mg by mouth at bedtime. 08/22/23  Yes [provider]  Multiple Vitamins-Minerals (OCUVITE EXTRA) TABS Take 1 tablet by mouth in the morning.   Yes [provider]  nystatin  powder Apply 1 Application topically 2 (two) times daily.   Yes [provider]  Polyvinyl Alcohol -Povidone (CLEAR EYES NATURAL TEARS) 5-6 MG/ML SOLN Apply 1 drop to eye in the morning.   Yes [provider]  pramoxine (SARNA SENSITIVE) 1 % LOTN Apply 1 Application topically 2 (two) times daily.   Yes [provider]  UNABLE TO FIND Take 120 mLs by mouth  in the morning, at noon, and at bedtime. 120cc of water for hydration and to support renal function   Yes [provider]  Vitamin D, Ergocalciferol, (DRISDOL) 1.25 MG (50000 UNIT) CAPS capsule Take 50,000 Units by mouth once a week. On Tuesdays   Yes [provider]    Physical Exam: Vitals:   08/27/23 1815 08/27/23 1817 08/27/23 1820 08/27/23 1825  BP: 91/70 90/70 (!) 106/90 112/86  Pulse: 96     Resp: 19 (!) 22 20 (!) 26  Temp: (!) 96.8 F (36 C)     TempSrc: Axillary     SpO2: 92%     Weight: 45.4 kg     Height: 5\' 5"  (1.651 m)     Patient seen approx. 6:25 PM in room 20 of the ED Constitutional:  Vital Signs as per [facility] than three noted] No Acute Distress ENMT:   External Appearance of Ears and Nose without obvious deformity, masses or scar             Poor Dentition, Dried Blood Around mouth Neck:     Trachea Midline, Neck Symmetric             Thyroid without tenderness, palpable masses or nodules Respiratory:   Respiratory Effort Normal: No Use of Respiratory Muscles,No  Intercostal Retractions             Lungs Right Basilar  Crackles Cardiovascular:   Heart Auscultated: Regular Regular without any added sounds or murmurs              No Lower Extremity Edema Gastrointestinal:  Abdomen soft and nontender without palpable masses, guarding or rebound  No Palpable Splenomegaly or Hepatomegaly Right Shoulder Scar Psychiatric:  Patient Not Orientated to Time, Place and Person Patient with Blunted mood and affect Recent and Remote Memory Impaired Patient opens eyes to tactile stimulation and grumbles one word answers Not able to cooperate with neurological testing   Data Reviewed: Labs, Radiology, ECG as detailed in HPI and A/P   Assessment and Plan: * Sepsis (HCC) Severe sepsis Likely source is aspiration pneumonia and urinary in nature Patient has both recent exposures and risk for MRSA as well as gram-negative pna risks, was completing antibiotics in the nursing home for presumed pneumonia Hx of ESBL UTI Initially presenting with systolic blood pressure 68 as per nursing triage note now containing MAP greater than 65 off pressors with systolic blood pressure 110, CT with right lower lobe infiltrate urinalysis with findings consistent with UTI UA is pending WBC is 29 and temperature is currently 35.8 Celsius patient's pH was 7.3 on VBG initial lactic acid was 4.7 increasing to 5.5 patient received 1 L with EMS and 350 mL in ED completing 30 mL/kg the patient's 45 kg Plan: Vancomycin azithromycin  and Meropenum (ESBL 11/07/2022 [cancelled Cefepime and Zosyn accordingly]), has multiple risk factors for MRSA or gram-negative pneumonia we will obtain MRSA nasal swab as well as sputum culture and urine culture to de-escalate to follow blood cultures and urine culture, maintain MAP greater than 65, providing another 1 L lactated Ringer 's and will follow lactate (remains hemodynamically stable at this time despite increasing lactic acid)  Acute hypoxic respiratory failure (HCC) Acute hypoxic respiratory failure Patient  requiring 5 L nasal cannula likely this is secondary to her pneumonia possible pulmonary embolism as well we will follow continuous pulse oximetry and titrate oxygen to maintain saturations greater than 89%   GI bleed Suspected acute GI bleed upper source Patient  with dark stools nausea and vomiting hemoglobin 10.2 from 11.9 Hemoccult positive holding the patient's apixaban  providing twice daily IV PPI as well as Zofran  type and screen and serial hemoglobin following  Acute pulmonary embolism (HCC) Suspected pulmonary embolism Patient had admission in January with suspected acute on chronic pulmonary embolism given her GI bleed and presenting with hypotension underwent detailed shared decision-making with the patient's son via telephone given the patient could not participate explaining the risk of discontinuing the anticoagulant would be worsening pulmonary embolism and possible cardiopulmonary compromise from hemodynamically significant PE versus further GI bleeding.  Patient's son believes the initial diagnosis of acute on chronic PE was equivocal and would like to follow hemoglobin off of the [facility].  Accordingly have held the Eliquis , ordered echocardiogram to review for any hemodynamically significant PE/RV strain and needs short interval shareddecision making for empiric heparin  if hemoglobin remains stable given her age, clinical status and comorbidities she is unlikely to be an endoscopy candidate  AKI (acute kidney injury) (HCC)  Acute kidney injury on chronic kidney disease stage IIIa Baseline creatinine 1.1 increased to 3 today No evidence of postobstructive pattern on the patient's CT abdomen pathology however suspect this is primary due to the sepsis Nephrology has been consulted we are providing crystalloid and holding nephrotoxins, will follow creatinine  Hyperkalemia  Hyperkalemia Potassium is 5.3 ECG 12-lead independently reviewed and interpreted she has a left bundle branch block  with resultant ST changes unchanged from April 30, 2022 ventricular rate is in the low 90s QTc 474 T wave is peaked received both insulin as well as calcium  in the emergency department have started Lokelma and consultation with nephrology and ordered serial BMP to follow, telemetry  Essential hypertension   Hypertension Holding the patient's home lisinopril  10 mg as well as hydralazine  25 mg given her hypotension and acute kidney injury  Fecal impaction (HCC)   Fecal impaction Holding the patient's loperamide she was receiving at the nursing home ordered tapwater enema and bisacodyl  when mental status improves can consider oral CT: . Distal colonic diverticulosis without evidence of diverticulitis. 5. Large amount of retained stool within the colon, which may reflect fecal impaction. No bowel obstruction or ileus.  Acute metabolic encephalopathy  Metabolic encephalopathy, acute Secondary to her severe sepsis Given chronically anticoagulated with nausea and vomiting obtaining a CT of the head as well as B12 and ammonia for completeness  Elevated INR   Elevated INR Secondary to the apixaban  interference,  holding No indication for reversal at this time given hemodynamically stable  Hiatal hernia    Hiatal hernia CT revealed stable hiatal hernia do not suspect that this is the source of the lactic acid benign abdominal examination   Hypothyroidism  Hypothyroidism Patient is hypothermic likely from sepsis however will check TSH and free T4 continue her home levothyroxine    Pulmonary nodules  Pulmonary nodules Interval outpatient follow-up  Impending Metabolic Acidosis Secondary to AKI, Sepsis Reverse underlying cause Seen on VBG and BMP decreased bicarb IV and PO Bicarb given as well since <21   Advance Care Planning:   Code Status: Limited: Do not attempt resuscitation (DNR) -DNR-LIMITED -Do Not Intubate/DNI    Consults: Nephrology  Family Communication:     Patient has a DO NOT RESUSCITATE DO NOT INTUBATE do not go to the ICU but okay with fluids as well as antibiotics at the bedside which was a verbal from the son and I had a long conversation with the son ~6:40 PM regarding goals  of care plan of care as well as shared decision making he wishes for progressive care unit IV fluids blood products if needed and antibiotics but does not want ICU does not want intubation does not want defibrillation.  Explained the patient's extremely guarded prognosis and high risk of death or disability.   Notified by nurse at 7:15 PM patient already received azithromycin  today have asked to start tomorrow also she notified very loose stool, of note the patient has fecal impaction suspect this is overflow diarrhea no evidence of colitis on CT to think this is a C. difficile situation given the recent antibiotics, suspect an overflow diarrhea  Severity of Illness: The appropriate patient status for this patient is INPATIENT. Inpatient status is judged to be reasonable and necessary in order to provide the required intensity of service to ensure the patient's safety. The patient's presenting symptoms, physical exam findings, and initial radiographic and laboratory data in the context of their chronic comorbidities is felt to place them at high risk for further clinical deterioration. Furthermore, it is not anticipated that the patient will be medically stable for discharge from the hospital within 2 midnights of admission.   * I certify that at the point of admission it is my clinical judgment that the patient will require inpatient hospital care spanning beyond 2 midnights from the point of admission due to high intensity of service, high risk for further deterioration and high frequency of surveillance required.*  Upon my evaluation, this patient had a high probability of imminent or life-threatening deterioration due to  Severe sepsis with acute metabolic encephalopathy  aspiration pneumonia acute kidney injury  acute hypoxic respiratory failure urinary tract infection, which required my direct attention, intervention, and personal management. I have personally provided 65 minutes of critical care time exclusive of time spent on separately billable procedures. Time includes review of laboratory data, radiology results, discussion with consultants, and monitoring for potential decompensation. Interventions were performed as documented above.   Patient admitted to progressive  Updates [routine lab f/u] Map remains ~63 Discussed with the patient's son at 1:25 AM 08/11/2023 the extremely guarded prognosis of this patient age 22 persistently high lactic acid labile blood pressure DNR/DNI etc. ICU outside GOC , making arrangements  from [place]  and has a daughter who lives locally he is going to ask to come visit.  Midodrine started 3 times daily given IV vasopressors outside of goals of care, patient not completely fluid responsive. [facility] has already given x1 dose and followed labs with thanks as well as ordered albumin  and more fluid.  CT Head no acute events  Author: Russel Courser, MD 08/27/2023 7:31 PM  For on call review [email or web address].

## 2023-08-27 NOTE — ED Triage Notes (Signed)
 PT BIB [facility] from [facility] due to UTI and vomiting.  Staff states that she vomiting and aspirated on some of the vomit.  Hx Afib. 20g left hand; 20g left forearm. Responsive only to verbal.   VS BP 68/32, 1liter LR 4mg  Zofran  given en route.

## 2023-08-27 NOTE — Assessment & Plan Note (Signed)
 Suspected acute GI bleed upper source Patient with dark stools nausea and vomiting hemoglobin 10.2 from 11.9 Hemoccult positive holding the patient's apixaban  providing twice daily IV PPI as well as Zofran  type and screen and serial hemoglobin following

## 2023-08-27 NOTE — Progress Notes (Addendum)
 Pharmacy Antibiotic Note  Cindy [name] is a 89 y.o. female admitted on 08/27/2023 with pneumonia.  Pharmacy has been consulted for meropenem and vancomycin dosing. Noted AKI - Scr: 3.00 baseline around 1.0.    Plan: Meropenem 500mg  q24h.  Vancomycin 1000mg  then dose based on levels / renal recovery.  Follow culture data for de-escalation.  Monitor renal function for dose adjustments as indicated.  F/u MRSA PCR.   Height: 5\' 5"  (165.1 cm) Weight: 45.4 kg (100 lb) IBW/kg (Calculated) : 57  Temp (24hrs), Avg:96.7 F (35.9 C), Min:96.5 F (35.8 C), Max:96.8 F (36 C)  Recent Labs  Lab 08/27/23 1518 08/27/23 1618 08/27/23 1619 08/27/23 1620 08/27/23 1753  WBC 29.1*  --   --   --   --   CREATININE 2.97*  --  3.10* 3.00*  --   LATICACIDVEN  --  4.7*  --   --  5.5*    Estimated Creatinine Clearance: 7.3 mL/min (A) (by C-G formula based on SCr of 3 mg/dL (H)).    Allergies  Allergen Reactions   Fish Allergy Hives, Nausea And Vomiting, Swelling and Other (See Comments)    Redness    Diclofenac  Other (See Comments)    Not allergic per caregiver    Ibuprofen  Other (See Comments)    Affects kidney function   Sulfasalazine Other (See Comments)    Unknown    Sulfa Antibiotics     Unknown     Antimicrobials this admission: Meropenem 5/17 >>  Vancomycin 5/17 >>   Microbiology results: 5/17 BCx:  5/17 UCx:   5/17 Sputum:   5/17 MRSA PCR:   Thank you for allowing pharmacy to be a part of this patient's care.  Mamie Searles, PharmD, BCCCP  08/27/2023 7:09 PM

## 2023-08-27 NOTE — Assessment & Plan Note (Signed)
   Hypertension Holding the patient's home lisinopril  10 mg as well as hydralazine  25 mg given her hypotension and acute kidney injury

## 2023-08-27 NOTE — Assessment & Plan Note (Addendum)
 Severe sepsis Likely source is aspiration pneumonia and urinary in nature Patient has both recent exposures and risk for MRSA as well as gram-negative agents, was completing antibiotics in the nursing home for presumed pneumonia Hx of ESBL UTI Initially presenting with systolic blood pressure 68 as per nursing triage note now containing MAP greater than 65 off pressures with systolic blood pressure 110, CT with right lower lobe infiltrate urinalysis with findings consistent with UTI UA is pending WBC is 29 and temperature is currently 35.8 Celsius patient's pH was 7.3 with metabolic acidosis initial lactic acid was 4.7 increasing to 5.5 patient received 1 L with EMS and 350 mL in ED completing 30 mL/kg the patient's 45 kg Plan: Vancomycin azithromycin  and Meropenum (ESBL 11/07/2022 [cancelled Cefepime and Zosyn accordingly]), has multiple risk factors for MRSA or gram-negative pneumonia we will obtain MRSA nasal swab as well as sputum culture and urine culture to de-escalate to follow blood cultures and urinalysis, maintain MAP greater than 65, providing another 1 L lactated Ringer 's and will follow lactate (remains hemodynamically stable at this time despite increasing lactic acid)

## 2023-08-27 NOTE — ED Notes (Signed)
 Pt and bedding changed; black stool noted

## 2023-08-27 NOTE — Assessment & Plan Note (Signed)
  Metabolic encephalopathy, acute Secondary to her severe sepsis Given chronically anticoagulated with nausea and vomiting obtaining a CT of the head as well as B12 and ammonia for completeness

## 2023-08-27 NOTE — Assessment & Plan Note (Signed)
    Hiatal hernia CT revealed stable hiatal hernia do not suspect that this is the source of the lactic acid benign abdominal examination

## 2023-08-27 NOTE — Assessment & Plan Note (Signed)
  Pulmonary nodules Interval outpatient follow-up

## 2023-08-27 NOTE — Assessment & Plan Note (Signed)
   Fecal impaction Holding the patient's loperamide she was receiving at the nursing home ordered tapwater enema and bisacodyl  when mental status improves can consider oral CT: . Distal colonic diverticulosis without evidence of diverticulitis. 5. Large amount of retained stool within the colon, which may reflect fecal impaction. No bowel obstruction or ileus.

## 2023-08-27 NOTE — Assessment & Plan Note (Signed)
 Acute hypoxic respiratory failure Patient requiring 5 L nasal cannula likely this is secondary to her pneumonia possible pulmonary embolism as well we will follow continuous pulse oximetry and titrate oxygen to maintain saturations greater than 89%

## 2023-08-27 NOTE — ED Notes (Signed)
 Bair hugger applied.

## 2023-08-27 NOTE — Assessment & Plan Note (Signed)
  Hypothyroidism Patient is hypothermic likely from sepsis however will check TSH and free T4 continue her home levothyroxine

## 2023-08-28 ENCOUNTER — Inpatient Hospital Stay (HOSPITAL_COMMUNITY)

## 2023-08-28 DIAGNOSIS — A419 Sepsis, unspecified organism: Secondary | ICD-10-CM | POA: Diagnosis not present

## 2023-08-28 LAB — PROTIME-INR
INR: 3.9 — ABNORMAL HIGH (ref 0.8–1.2)
Prothrombin Time: 38.4 s — ABNORMAL HIGH (ref 11.4–15.2)

## 2023-08-28 LAB — MAGNESIUM: Magnesium: 1.9 mg/dL (ref 1.7–2.4)

## 2023-08-28 LAB — COMPREHENSIVE METABOLIC PANEL WITH GFR
ALT: 13 U/L (ref 0–44)
AST: 23 U/L (ref 15–41)
Albumin: 3.7 g/dL (ref 3.5–5.0)
Alkaline Phosphatase: 31 U/L — ABNORMAL LOW (ref 38–126)
Anion gap: 17 — ABNORMAL HIGH (ref 5–15)
BUN: 115 mg/dL — ABNORMAL HIGH (ref 8–23)
CO2: 15 mmol/L — ABNORMAL LOW (ref 22–32)
Calcium: 10.4 mg/dL — ABNORMAL HIGH (ref 8.9–10.3)
Chloride: 117 mmol/L — ABNORMAL HIGH (ref 98–111)
Creatinine, Ser: 3.06 mg/dL — ABNORMAL HIGH (ref 0.44–1.00)
GFR, Estimated: 13 mL/min — ABNORMAL LOW (ref >60)
Glucose, Bld: 124 mg/dL — ABNORMAL HIGH (ref 70–99)
Potassium: 5.1 mmol/L (ref 3.5–5.1)
Sodium: 149 mmol/L — ABNORMAL HIGH (ref 135–145)
Total Bilirubin: 0.9 mg/dL (ref 0.0–1.2)
Total Protein: 5.7 g/dL — ABNORMAL LOW (ref 6.5–8.1)

## 2023-08-28 LAB — C-REACTIVE PROTEIN: CRP: 9.2 mg/dL — ABNORMAL HIGH (ref <1.0)

## 2023-08-28 LAB — LACTIC ACID, PLASMA
Lactic Acid, Venous: 3.6 mmol/L (ref 0.5–1.9)
Lactic Acid, Venous: 5.9 mmol/L (ref 0.5–1.9)

## 2023-08-28 LAB — PROCALCITONIN: Procalcitonin: 2.55 ng/mL

## 2023-08-28 LAB — BRAIN NATRIURETIC PEPTIDE: B Natriuretic Peptide: 755.1 pg/mL — ABNORMAL HIGH (ref 0.0–100.0)

## 2023-08-28 MED ORDER — POLYVINYL ALCOHOL 1.4 % OP SOLN: 1.0000 [drp] | Freq: Four times a day (QID) | OPHTHALMIC | Status: DC | PRN

## 2023-08-28 MED ORDER — LORAZEPAM 1 MG PO TABS: 1.0000 mg | ORAL_TABLET | ORAL | Status: DC | PRN

## 2023-08-28 MED ORDER — BISACODYL 10 MG RE SUPP: 10.0000 mg | Freq: Every day | RECTAL | Status: DC

## 2023-08-28 MED ORDER — ALBUMIN HUMAN 25 % IV SOLN
25.0000 g | INTRAVENOUS | Status: AC
  Administered 2023-08-28: 25 g via INTRAVENOUS
  Filled 2023-08-28: qty 100

## 2023-08-28 MED ORDER — LORAZEPAM 2 MG/ML PO CONC: 1.0000 mg | ORAL | Status: DC | PRN

## 2023-08-28 MED ORDER — GERHARDT'S BUTT CREAM
1.0000 | TOPICAL_CREAM | Freq: Four times a day (QID) | CUTANEOUS | Status: DC
  Administered 2023-08-28 (×2): 1 via TOPICAL
  Filled 2023-08-28: qty 60

## 2023-08-28 MED ORDER — HYDROMORPHONE HCL 1 MG/ML IJ SOLN
0.5000 mg | INTRAMUSCULAR | Status: DC | PRN
  Administered 2023-08-28: 2 mg via INTRAVENOUS
  Filled 2023-08-28: qty 2

## 2023-08-28 MED ORDER — BISACODYL 10 MG RE SUPP: 10.0000 mg | Freq: Every day | RECTAL | Status: DC | PRN

## 2023-08-28 MED ORDER — SODIUM CHLORIDE 0.9 % IV SOLN: INTRAVENOUS | Status: DC

## 2023-08-28 MED ORDER — SODIUM CHLORIDE 0.9 % IV BOLUS
250.0000 mL | INTRAVENOUS | Status: AC
  Administered 2023-08-28: 250 mL via INTRAVENOUS

## 2023-08-28 MED ORDER — LORAZEPAM 2 MG/ML IJ SOLN
1.0000 mg | INTRAMUSCULAR | Status: DC | PRN
  Administered 2023-08-28: 1 mg via INTRAVENOUS
  Filled 2023-08-28: qty 1

## 2023-08-28 MED ORDER — HEPARIN SODIUM (PORCINE) 5000 UNIT/ML IJ SOLN: 5000.0000 [IU] | Freq: Three times a day (TID) | INTRAMUSCULAR | Status: DC

## 2023-08-28 MED ORDER — SODIUM CHLORIDE 0.9 % IV SOLN: 12.5000 mg | Freq: Four times a day (QID) | INTRAVENOUS | Status: DC | PRN

## 2023-08-28 MED ORDER — MIDODRINE HCL 5 MG PO TABS: 10.0000 mg | ORAL_TABLET | Freq: Three times a day (TID) | ORAL | Status: DC

## 2023-08-28 MED ORDER — BIOTENE DRY MOUTH MT LIQD: 15.0000 mL | OROMUCOSAL | Status: DC | PRN

## 2023-08-28 MED ORDER — IPRATROPIUM-ALBUTEROL 0.5-2.5 (3) MG/3ML IN SOLN: 3.0000 mL | Freq: Four times a day (QID) | RESPIRATORY_TRACT | Status: DC

## 2023-08-31 LAB — URINE CULTURE: Culture: >=100000 — AB

## 2023-09-01 LAB — CULTURE, BLOOD (ROUTINE X 2)
Culture: NO GROWTH
Culture: NO GROWTH

## 2023-09-11 NOTE — Progress Notes (Signed)
 SLP Cancellation Note  Patient Details Name: Cindy [name] MRN: [id number] DOB: 01-08-1923   Cancelled treatment:       Reason Eval/Treat Not Completed: Medical issues which prohibited therapy. [name] and pt is having agonal breathing per RN. Confirmed with MD who stated pt not appropriate for swallow eval.     Naomia Bachelor 09/07/2023, 9:53 AM

## 2023-09-11 NOTE — Plan of Care (Signed)
 BP fluctuates but still having black tarry emesis and stools

## 2023-09-11 NOTE — Progress Notes (Addendum)
 PROGRESS NOTE                                                                                                                                                                                                             Patient Demographics:    Cindy [name], is a 89 y.o. female, DOB - 11-Apr-1923, ZHY:[id number]  Outpatient Primary MD for the patient is Charolet Cope [name], MD    LOS - 1  Admit date - 08/27/2023    Chief Complaint  Patient presents with   Code Sepsis       Brief Narrative (HPI from H&P)   89 year old female who came from [facility] facility with a past medical history of pulmonary embolism L2 compression fracture chronic kidney disease stage IIIa, hypertension and hypothyroidism presented with decreased level of consciousness concern for UTI as well as nausea and vomiting.   Of note upon review of the patient's nursing home records she was on doxycycline  from 5/7 onwards for pulmonary symptoms and presumed pneumonia, the history was confirmed via telephone with the patient's son.  The patient cannot provide any independent history but there is no history of other localizing symptoms.  In the ER she was diagnosed with severe sepsis, hypothermia, acute hypoxic respiratory failure, likely source aspiration pneumonia and UTI.  She was admitted for further care.   Subjective:    Wellington Half today in bed appears to be in no distress but somnolent and cannot answer questions reliably.   Assessment  & Plan :    Addendum received a phone call from patient's daughter Urban Garden and son Josefina Nian again on 08/12/2023 at 8:30 AM.  They would like to transition to full comfort measures.  Will stop all noncomfort medications.  Other medical issues addressed earlier this admission are below.     Severe sepsis with hypothermia, acute hypoxic respiratory failure.  Likely source of ongoing likely chronic aspiration now aspiration pneumonia  and UTI. She certainly has severe sepsis, with hypothermia, lactic acidosis, renal insufficiency and hypotension, she has been placed on appropriate antibiotics which are IV vancomycin and meropenem, continue IV fluids and Bair hugger, she is still being hydrated with IV fluids, still has Lawyer, overall extremely poor prognosis, frail, 89 year old, underlying memory loss.  Long discussions with patient's son, no heroics, continue present level of care, if any significant decline  then transition to full comfort measures, follow cultures, continue supportive care.  Critical care time on 08/30/2023 - 30 minutes.   GI bleed - Suspected acute GI bleed upper source, reported history of dark stools could be upper GI bleed or significant constipation, IV PPI, type screen done continue to monitor.  History of pulmonary embolism (HCC) diagnosed in January of this year, due to question of GI bleed Eliquis  currently on hold.  Check echocardiogram to evaluate RV function, monitor clinically.  AKI (acute kidney injury) (HCC) on CKD stage IIIa.  Baseline creatinine around 1.1-1.3, due to severe sepsis, hydrate and monitor.  Avoid nephrotoxins.  Nephrology on board, not a dialysis candidate.  Continue Foley catheter.  Hyperkalemia - received Lokelma will monitor.  Essential hypertension -  Holding the patient's home lisinopril  10 mg as well as hydralazine  25 mg given her hypotension and acute kidney injury  Fecal impaction (HCC) - Holding the patient's loperamide she was receiving at the nursing home, tapwater enema given in ER, Dulcolax suppository, once mentation improves oral bowel regimen.  Acute metabolic encephalopathy in a patient with history of underlying memory loss. Metabolic encephalopathy, acute secondary to severe sepsis.  Supportive care.  CT head nonacute, labile B12 and ammonia, no focal deficits.  Elevated INR  Secondary to the apixaban  interference,  holding as above.  Hiatal hernia   CT revealed stable hiatal hernia do not suspect that this is the source of the lactic acid benign abdominal examination, not a surgical candidate.  Hypothyroidism  Hypothyroidism, table TSH, free T4 mildly elevated will monitor.  Pulmonary nodules - no workup at this age.      Condition - Extremely Guarded  Family Communication  : Discussed with patient's son Josefina Nian [phone] on 08/29/2023, no heroics, DNR, if declines significantly full comfort.  Code Status : DNR  Consults  : None  PUD Prophylaxis : PPI   Procedures  :     Echocardiogram.    CT head.  Nonacute.    CT chest abdomen pelvis.  1. Patchy right lower lobe ground-glass airspace disease which may reflect infection or aspiration. 2. Large hiatal hernia, with compressive left lower lobe atelectasis and trace left pleural effusion unchanged. 3. Stable 15 mm right middle lobe pulmonary nodule, which can be considered benign given long-term stability. No specific follow-up recommended. 4. Distal colonic diverticulosis without evidence of diverticulitis. 5. Large amount of retained stool within the colon, which may reflect fecal impaction. No bowel obstruction or ileus. 6. Cholelithiasis without cholecystitis. 7.  Aortic Atherosclerosis (ICD10-I70.0). 8. Age indeterminate compression deformity inferior endplate T9 vertebral body, with less than 10% loss of height. This is new since the 2024 exam, with imaging characteristics suggesting subacute to chronic fracture. Progressive loss of height at a chronic L2 compression fracture as above      Disposition Plan  :    Status is: Inpatient  DVT Prophylaxis  :    SCDs Start: 08/27/23 1843   Lab Results  Component Value Date   PLT 461 (H) 08/27/2023    Diet :  Diet Order             Diet NPO time specified Except for: Sips with Meds  Diet effective now                    Inpatient Medications  Scheduled Meds:  Gerhardt's butt cream  1 Application Topical QID    levothyroxine   75 mcg Oral QAC breakfast   midodrine  10 mg Oral TID   pantoprazole  (PROTONIX ) IV  40 mg Intravenous Q12H   sodium bicarbonate  650 mg Oral TID   sodium zirconium cyclosilicate  10 g Oral TID   vancomycin variable dose per unstable renal function (pharmacist dosing)   Does not apply See admin instructions   Continuous Infusions:  sodium chloride      azithromycin  Stopped (08/27/23 1918)   meropenem (MERREM) IV 500 mg (08/27/23 2120)   PRN Meds:.ondansetron  (ZOFRAN ) IV  Antibiotics  :    Anti-infectives (From admission, onward)    Start     Dose/Rate Route Frequency Ordered Stop   08/27/23 2030  meropenem (MERREM) 500 mg in sodium chloride  0.9 % 100 mL IVPB        500 mg 200 mL/hr over 30 Minutes Intravenous Every 24 hours 08/27/23 1930     08/27/23 1915  piperacillin-tazobactam (ZOSYN) IVPB 2.25 g  Status:  Discontinued        2.25 g 100 mL/hr over 30 Minutes Intravenous Every 8 hours 08/27/23 1904 08/27/23 1928   08/27/23 1913  vancomycin variable dose per unstable renal function (pharmacist dosing)         Does not apply See admin instructions 08/27/23 1913     08/27/23 1900  azithromycin  (ZITHROMAX ) 500 mg in sodium chloride  0.9 % 250 mL IVPB        500 mg 250 mL/hr over 60 Minutes Intravenous Every 24 hours 08/27/23 1845 09/01/23 1859   08/27/23 1900  vancomycin (VANCOCIN) IVPB 1000 mg/200 mL premix        1,000 mg 200 mL/hr over 60 Minutes Intravenous  Once 08/27/23 1849 08/27/23 2217   08/27/23 1900  ceFEPIme (MAXIPIME) 1 g in sodium chloride  0.9 % 100 mL IVPB  Status:  Discontinued        1 g 200 mL/hr over 30 Minutes Intravenous Every 24 hours 08/27/23 1849 08/27/23 1859   08/27/23 1545  Ampicillin-Sulbactam (UNASYN) 3 g in sodium chloride  0.9 % 100 mL IVPB  Status:  Discontinued        3 g 200 mL/hr over 30 Minutes Intravenous  Once 08/27/23 1532 08/27/23 1532   08/27/23 1545  cefTRIAXone  (ROCEPHIN ) 1 g in sodium chloride  0.9 % 100 mL IVPB        1  g 200 mL/hr over 30 Minutes Intravenous  Once 08/27/23 1532 08/27/23 1634   08/27/23 1545  azithromycin  (ZITHROMAX ) 500 mg in sodium chloride  0.9 % 250 mL IVPB        500 mg 250 mL/hr over 60 Minutes Intravenous  Once 08/27/23 1532 08/27/23 1658         Objective:   Vitals:   08/16/2023 0221 08/25/2023 0345 08/15/2023 0400 09/02/2023 0657  BP: 98/65 (!) 106/59 (!) 103/51   Pulse: 84 (!) 186    Resp: (!) 29 20 (!) 21   Temp: (!) 96.6 F (35.9 C)   97.7 F (36.5 C)  TempSrc: Rectal   Axillary  SpO2: 100% 100% 90%   Weight:      Height:        Wt Readings from Last 3 Encounters:  08/27/23 38.8 kg  11/05/22 45.4 kg  05/06/22 46.8 kg    No intake or output data in the 24 hours ending 08/29/2023 0809   Physical Exam  Frail elderly female in bed, somnolent, minimally responsive, moves all 4 extremities to painful stimuli, Foley catheter in place, Bair hugger,  [place].AT,PERRAL Supple Neck, No JVD,   Symmetrical  Chest wall movement, Good air movement bilaterally, CTAB RRR,No Gallops,Rubs or new Murmurs,  +ve B.Sounds, Abd Soft, No tenderness,   No Cyanosis, Clubbing or edema     RN pressure injury documentation: Pressure Injury 04/30/22 Coccyx Right;Left;Medial Deep Tissue Pressure Injury - Purple or maroon localized area of discolored intact skin or blood-filled blister due to damage of underlying soft tissue from pressure and/or shear. (Active)  04/30/22 2345  Location: Coccyx  Location Orientation: Right;Left;Medial  Staging: Deep Tissue Pressure Injury - Purple or maroon localized area of discolored intact skin or blood-filled blister due to damage of underlying soft tissue from pressure and/or shear.  Wound Description (Comments):   Present on Admission: Yes     Pressure Injury 08/27/23 Sacrum Medial Stage 1 -  Intact skin with non-blanchable redness of a localized area usually over a bony prominence. (Active)  08/27/23 2000  Location: Sacrum  Location Orientation: Medial   Staging: Stage 1 -  Intact skin with non-blanchable redness of a localized area usually over a bony prominence.  Wound Description (Comments):   Present on Admission: Yes  Dressing Type Foam - Lift dressing to assess site every shift 08/27/23 2035      Data Review:    Recent Labs  Lab 08/27/23 1518 08/27/23 1619 08/27/23 1620 08/27/23 1624 08/27/23 2030 08/27/23 2214  WBC 29.1*  --   --   --  29.1*  --   HGB 11.9* 10.2* 10.5* 10.2* 11.0* 10.6*  HCT 39.4 30.0* 31.0* 30.0* 34.2* 33.6*  PLT 468*  --   --   --  461*  --   MCV 114.5*  --   --   --  107.2*  --   MCH 34.6*  --   --   --  34.5*  --   MCHC 30.2  --   --   --  32.2  --   RDW 14.1  --   --   --  14.1  --   LYMPHSABS 1.5  --   --   --   --   --   MONOABS 2.6*  --   --   --   --   --   EOSABS 0.0  --   --   --   --   --   BASOSABS 0.0  --   --   --   --   --     Recent Labs  Lab 08/27/23 1518 08/27/23 1615 08/27/23 1618 08/27/23 1619 08/27/23 1620 08/27/23 1624 08/27/23 1753 08/27/23 2030 08/27/23 2201 08/27/23 2355 08/29/2023 0632  NA 143  --   --  144 144 145  --  146*  --   --   --   K 5.9*  --   --  5.3* 5.3* 5.3*  --  4.9  --   --   --   CL 114*  --   --  112* 114*  --   --  109  --   --   --   CO2 11*  --   --   --   --   --   --  17*  --   --   --   ANIONGAP 18*  --   --   --   --   --   --  20*  --   --   --   GLUCOSE 122*  --   --  130* 131*  --   --  211*  --   --   --  BUN 118*  --   --  107* 105*  --   --  113*  --   --   --   CREATININE 2.97*  --   --  3.10* 3.00*  --   --  3.01*  --   --   --   AST 32  --   --   --   --   --   --  32  --   --   --   ALT 13  --   --   --   --   --   --  14  --   --   --   ALKPHOS 61  --   --   --   --   --   --  55  --   --   --   BILITOT 1.0  --   --   --   --   --   --  0.7  --   --   --   ALBUMIN  2.8*  --   --   --   --   --   --  2.5*  --   --   --   LATICACIDVEN  --   --  4.7*  --   --   --  5.5* 6.1* 7.4* 5.9*  --   INR  --  2.6*  --   --   --   --    --   --   --   --  3.9*  TSH  --   --   --   --   --   --   --  1.566  --   --   --   AMMONIA  --   --   --   --   --   --   --  29  --   --   --   CALCIUM  11.0*  --   --   --   --   --   --  10.9*  --   --   --       Recent Labs  Lab 08/27/23 1518 08/27/23 1615 08/27/23 1618 08/27/23 1753 08/27/23 2030 08/27/23 2201 08/27/23 2355 08/18/2023 1610  LATICACIDVEN  --   --  4.7* 5.5* 6.1* 7.4* 5.9*  --   INR  --  2.6*  --   --   --   --   --  3.9*  TSH  --   --   --   --  1.566  --   --   --   AMMONIA  --   --   --   --  29  --   --   --   CALCIUM  11.0*  --   --   --  10.9*  --   --   --     --------------------------------------------------------------------------------------------------------------- Lab Results  Component Value Date   CHOL 202 (H) 03/05/2019   HDL 56 03/05/2019   LDLCALC 120 (H) 03/05/2019   TRIG 130 03/05/2019   CHOLHDL 3.6 03/05/2019    Lab Results  Component Value Date   HGBA1C 6.0 (H) 03/06/2019   Recent Labs    08/27/23 2030  TSH 1.566  FREET4 1.21*   Recent Labs    08/27/23 2030  VITAMINB12 643   ------------------------------------------------------------------------------------------------------------------ Cardiac Enzymes No results for input(s): "CKMB", "TROPONINI", "MYOGLOBIN" in the last 168 hours.  Invalid input(s): "CK"  Micro Results Recent Results (  from the past 240 hours)  Blood Culture (routine x 2)     Status: None (Preliminary result)   Collection Time: 08/27/23  3:18 PM   Specimen: BLOOD  Result Value Ref Range Status   Specimen Description BLOOD THUMB  Final   Special Requests   Final    BOTTLES DRAWN AEROBIC ONLY Blood Culture results may not be optimal due to an inadequate volume of blood received in culture bottles   Culture   Final    NO GROWTH < 24 HOURS Performed at [facility], [address], [place], [place] [address]    Report Status PENDING  Incomplete  Blood Culture (routine x 2)     Status:  None (Preliminary result)   Collection Time: 08/27/23  3:23 PM   Specimen: BLOOD  Result Value Ref Range Status   Specimen Description BLOOD RIGHT ANTECUBITAL  Final   Special Requests   Final    AEROBIC BOTTLE ONLY Blood Culture results may not be optimal due to an inadequate volume of blood received in culture bottles   Culture   Final    NO GROWTH < 24 HOURS Performed at [facility], [address] [place], [place] [address]    Report Status PENDING  Incomplete    Radiology Report DG Chest Port 1 View Result Date: 08/20/2023 CLINICAL DATA:  Shortness of breath. EXAM: PORTABLE CHEST 1 VIEW COMPARISON:  08/27/2023 FINDINGS: Asymmetric elevation left hemidiaphragm versus large hiatal hernia, as before. Marked gaseous distention of the stomach, also similar to prior. Interstitial markings are diffusely coarsened with chronic features. The cardio pericardial silhouette is enlarged. Bones are diffusely demineralized. Status post left shoulder replacement. IMPRESSION: 1. Chronic interstitial coarsening without acute cardiopulmonary findings. 2. Asymmetric elevation left hemidiaphragm versus large hiatal hernia with gaseous distention of the stomach. Electronically Signed   By: Donnal Fusi M.D.   On: 09/04/2023 07:07   CT HEAD WO CONTRAST ( ) Result Date: 08/27/2023 CLINICAL DATA:  Delirium EXAM: CT HEAD WITHOUT CONTRAST TECHNIQUE: Contiguous axial images were obtained from the base of the skull through the vertex without intravenous contrast. RADIATION DOSE REDUCTION: This exam was performed according to the departmental dose-optimization program which includes automated exposure control, adjustment of the mA and/or kV according to patient size and/or use of iterative reconstruction technique. COMPARISON:  CT June 09, 2020. FINDINGS: Brain: No evidence of acute infarction, hemorrhage, hydrocephalus, extra-axial collection or mass lesion/mass effect. Patchy white matter hypodensities,  nonspecific but compatible with chronic microvascular ischemic disease. Vascular: Calcific atherosclerosis. Skull: No acute fracture. Sinuses/Orbits: Air-fluid levels in the sinuses. No acute orbital findings. Other: Severe right TMJ degenerative change. IMPRESSION: 1. No evidence of acute intracranial abnormality. 2. Chronic microvascular ischemic disease. 3. Severe right TMJ degenerative change. Electronically Signed   By: Stevenson Elbe M.D.   On: 08/27/2023 20:59   CT CHEST ABDOMEN PELVIS WO CONTRAST Result Date: 08/27/2023 CLINICAL DATA:  Sepsis EXAM: CT CHEST, ABDOMEN AND PELVIS WITHOUT CONTRAST TECHNIQUE: Multidetector CT imaging of the chest, abdomen and pelvis was performed following the standard protocol without IV contrast. RADIATION DOSE REDUCTION: This exam was performed according to the departmental dose-optimization program which includes automated exposure control, adjustment of the mA and/or kV according to patient size and/or use of iterative reconstruction technique. COMPARISON:  04/30/2022, 08/27/2023 FINDINGS: CT CHEST FINDINGS Cardiovascular: Unenhanced imaging of the heart is unremarkable without pericardial effusion. Calcifications of the aortic valve. Normal caliber of the thoracic aorta. Atherosclerosis of the aorta  and coronary vasculature. Assessment of the vascular lumen cannot be performed without intravenous contrast. Mediastinum/Nodes: Stable large hiatal hernia extending into the lower left hemithorax, containing the majority of the stomach patulous esophagus again noted. Thyroid and trachea are grossly normal. No pathologic adenopathy. Lungs/Pleura: Trace left pleural effusion. Marked compressive atelectasis within the left lower lobe. There is patchy ground-glass airspace disease within the dependent right lower lobe, which may be related to infection or aspiration. No pneumothorax. Mild bronchiectasis, stable. 15 mm noncalcified right middle lobe pulmonary nodule reference  image 76/4, not appreciably changed since prior exam. Musculoskeletal: Mild sclerosis inferior endplate T9 vertebral body consistent with age indeterminate compression fracture, new since prior study. Less than 10% loss of height. Unremarkable left shoulder arthroplasty. Severe right shoulder osteoarthritis. Reconstructed images demonstrate no additional findings. CT ABDOMEN PELVIS FINDINGS Hepatobiliary: Minimal calcified gallstones. No evidence of acute cholecystitis. Unremarkable unenhanced appearance of the liver. Pancreas: Unremarkable unenhanced appearance. Spleen: Unremarkable unenhanced appearance. Adrenals/Urinary Tract: No urinary tract calculi or obstructive uropathy. The adrenals are stable. Indwelling Foley catheter, with small amount of gas in the bladder lumen due to catheter placement. Stomach/Bowel: No bowel obstruction or ileus. Large amount of retained stool within the rectum which may reflect fecal impaction. Diverticulosis of the descending and sigmoid colon without diverticulitis. No bowel wall thickening or inflammatory change. Vascular/Lymphatic: Aortic atherosclerosis. No enlarged abdominal or pelvic lymph nodes. Reproductive: Status post hysterectomy. No adnexal masses. Other: No free fluid or free intraperitoneal gas. No abdominal wall hernia. Musculoskeletal: No acute displaced fracture. Progression of the chronic L2 compression fracture seen previously, now with 25-50% loss of height. No retropulsion. Reconstructed images demonstrate no additional findings. IMPRESSION: 1. Patchy right lower lobe ground-glass airspace disease which may reflect infection or aspiration. 2. Large hiatal hernia, with compressive left lower lobe atelectasis and trace left pleural effusion unchanged. 3. Stable 15 mm right middle lobe pulmonary nodule, which can be considered benign given long-term stability. No specific follow-up recommended. 4. Distal colonic diverticulosis without evidence of diverticulitis.  5. Large amount of retained stool within the colon, which may reflect fecal impaction. No bowel obstruction or ileus. 6. Cholelithiasis without cholecystitis. 7.  Aortic Atherosclerosis (ICD10-I70.0). 8. Age indeterminate compression deformity inferior endplate T9 vertebral body, with less than 10% loss of height. This is new since the 2024 exam, with imaging characteristics suggesting subacute to chronic fracture. Progressive loss of height at a chronic L2 compression fracture as above. Electronically Signed   By: Bobbye Burrow M.D.   On: 08/27/2023 17:39   DG Chest Port 1 View Result Date: 08/27/2023 CLINICAL DATA:  Questionable sepsis EXAM: PORTABLE CHEST 1 VIEW COMPARISON:  Chest x-ray 05/03/2022.  CT of the chest 04/20/2022. FINDINGS: Large hiatal hernia is present. There is gaseous distension of the intrathoracic portion of the stomach. Nodular density in the right perihilar region measures 16 mm and appears unchanged. There is no new focal lung infiltrate, pleural effusion or pneumothorax. The cardiomediastinal silhouette appears stable. No acute fractures are seen. Surgical changes are partially visualized in the left shoulder. IMPRESSION: 1. No acute cardiopulmonary process. 2. Large hiatal hernia with gaseous distension of the intrathoracic portion of the stomach. 3. Stable nodular density in the right perihilar region. Electronically Signed   By: Tyron Gallon M.D.   On: 08/27/2023 15:45     Signature  -   Lynnwood Sauer M.D on 08/18/2023 at 8:09 AM   -  To page go to [email or web address]

## 2023-09-11 NOTE — Progress Notes (Signed)
 Cindy [name]'s Butt cream ordered for raw skin.  No charge note.

## 2023-09-11 NOTE — Death Summary Note (Addendum)
 [facility] Death Note                                                                                                                                                                                               Cindy [name], is a 89 y.o. female, DOB - [birth year], [id number]  Admit date - 2023/09/23   Admitting Physician Russel Courser, MD  Outpatient Primary MD for the patient is [name], [name], MD  LOS - 1  Chief Complaint  Patient presents with   Code Sepsis       Notification: Azalia Leo, MD notified of death of 09-24-23    Admit Date:  Sep 23, 2023  Date of Death: Date of Death: 09-24-2023  Time of Death: Time of Death: 0955  Length of Stay: 1   Pronounced by - RN  History of present illness:   Cindy [name] is a 89 year old female who came from [facility] facility with a past medical history of pulmonary embolism L2 compression fracture chronic kidney disease stage IIIa, hypertension and hypothyroidism presented with decreased level of consciousness concern for UTI as well as nausea and vomiting.  In the ER she was diagnosed with severe sepsis caused by UTI and pneumonia, after discussions with family she was transition to full comfort measures and passed away comfortably.   Final Diagnoses:  Cause of death -  Sepsis, UTI, Pneumonia  Signature  -    Lynnwood Sauer M.D on 09/24/2023 at 10:00 AM   -  To page go to [email or web address]   Total clinical and documentation time for today Under 30 minutes   Last Note                                                                        PROGRESS NOTE  Patient Demographics:    Cindy [name], is a 89 y.o. female, DOB - 11/09/1922,  [id number]  Outpatient Primary MD for the patient is Charolet Cope [name], MD    LOS - 1  Admit date - 08/27/2023    Chief Complaint  Patient presents with   Code Sepsis       Brief Narrative (HPI from H&P)   89 year old female who came from [facility] facility with a past medical history of pulmonary embolism L2 compression fracture chronic kidney disease stage IIIa, hypertension and hypothyroidism presented with decreased level of consciousness concern for UTI as well as nausea and vomiting.   Of note upon review of the patient's nursing home records she was on doxycycline  from 5/7 onwards for pulmonary symptoms and presumed pneumonia, the history was confirmed via telephone with the patient's son.  The patient cannot provide any independent history but there is no history of other localizing symptoms.  In the ER she was diagnosed with severe sepsis, hypothermia, acute hypoxic respiratory failure, likely source aspiration pneumonia and UTI.  She was admitted for further care.   Subjective:    Wellington Half today in bed appears to be in no distress but somnolent and cannot answer questions reliably.   Assessment  & Plan :    Addendum received a phone call from patient's daughter Urban Garden and son Josefina Nian again on Sep 07, 2023 at 8:30 AM.  They would like to transition to full comfort measures.  Will stop all noncomfort medications.  Other medical issues addressed earlier this admission are below.     Severe sepsis with hypothermia, acute hypoxic respiratory failure.  Likely source of ongoing likely chronic aspiration now aspiration pneumonia and UTI. She certainly has severe sepsis, with hypothermia, lactic acidosis, renal insufficiency and hypotension, she has been placed on appropriate antibiotics which are IV vancomycin  and meropenem , continue IV fluids and Bair hugger, she is still being hydrated with IV fluids, still has Lawyer, overall extremely poor prognosis, frail, 89 year old, underlying  memory loss.  Long discussions with patient's son, no heroics, continue present level of care, if any significant decline then transition to full comfort measures, follow cultures, continue supportive care.  Critical care time on 09/07/23 - 30 minutes.   GI bleed - Suspected acute GI bleed upper source, reported history of dark stools could be upper GI bleed or significant constipation, IV PPI, type screen done continue to monitor.  History of pulmonary embolism (HCC) diagnosed in January of this year, due to question of GI bleed Eliquis  currently on hold.  Check echocardiogram to evaluate RV function, monitor clinically.  AKI (acute kidney injury) (HCC) on CKD stage IIIa.  Baseline creatinine around 1.1-1.3, due to severe sepsis, hydrate and monitor.  Avoid nephrotoxins.  Nephrology on board, not a dialysis candidate.  Continue Foley catheter.  Hyperkalemia - received Lokelma  will monitor.  Essential hypertension -  Holding the patient's home lisinopril  10 mg as well as hydralazine  25 mg given her hypotension and acute kidney injury  Fecal impaction (HCC) - Holding the patient's loperamide she was receiving at the nursing home, tapwater enema given in ER, Dulcolax suppository, once mentation improves oral bowel regimen.  Acute metabolic encephalopathy in a patient with history of underlying memory loss. Metabolic encephalopathy, acute secondary to severe sepsis.  Supportive care.  CT head nonacute, labile B12 and ammonia, no focal deficits.  Elevated INR  Secondary to the apixaban  interference,  holding as above.  Hiatal hernia  CT revealed stable hiatal hernia do not  suspect that this is the source of the lactic acid benign abdominal examination, not a surgical candidate.  Hypothyroidism  Hypothyroidism, table TSH, free T4 mildly elevated will monitor.  Pulmonary nodules - no workup at this age.      Condition - Extremely Guarded  Family Communication  : Discussed with patient's  son Josefina Nian [phone] on 2023/09/27, no heroics, DNR, if declines significantly full comfort.  Code Status : DNR  Consults  : None  PUD Prophylaxis : PPI   Procedures  :     Echocardiogram.    CT head.  Nonacute.    CT chest abdomen pelvis.  1. Patchy right lower lobe ground-glass airspace disease which may reflect infection or aspiration. 2. Large hiatal hernia, with compressive left lower lobe atelectasis and trace left pleural effusion unchanged. 3. Stable 15 mm right middle lobe pulmonary nodule, which can be considered benign given long-term stability. No specific follow-up recommended. 4. Distal colonic diverticulosis without evidence of diverticulitis. 5. Large amount of retained stool within the colon, which may reflect fecal impaction. No bowel obstruction or ileus. 6. Cholelithiasis without cholecystitis. 7.  Aortic Atherosclerosis (ICD10-I70.0). 8. Age indeterminate compression deformity inferior endplate T9 vertebral body, with less than 10% loss of height. This is new since the 2024 exam, with imaging characteristics suggesting subacute to chronic fracture. Progressive loss of height at a chronic L2 compression fracture as above      Disposition Plan  :    Status is: Inpatient  DVT Prophylaxis  :    SCDs Start: 08/27/23 1843   Lab Results  Component Value Date   PLT 461 (H) 08/27/2023    Diet :  Diet Order             Diet NPO time specified Except for: Sips with Meds  Diet effective now                    Inpatient Medications  Scheduled Meds:  bisacodyl   10 mg Rectal Daily   Gerhardt's butt cream  1 Application Topical QID   ipratropium-albuterol   3 mL Nebulization Q6H   pantoprazole  (PROTONIX ) IV  40 mg Intravenous Q12H   Continuous Infusions:  chlorproMAZINE (THORAZINE) 12.5 mg in sodium chloride  0.9 % 25 mL IVPB     PRN Meds:.antiseptic oral rinse, bisacodyl , chlorproMAZINE (THORAZINE) 12.5 mg in sodium chloride  0.9 % 25 mL IVPB, HYDROmorphone   (DILAUDID ) injection, LORazepam  **OR** LORazepam  **OR** LORazepam , ondansetron  (ZOFRAN ) IV, polyvinyl alcohol   Antibiotics  :    Anti-infectives (From admission, onward)    Start     Dose/Rate Route Frequency Ordered Stop   08/27/23 2030  meropenem  (MERREM ) 500 mg in sodium chloride  0.9 % 100 mL IVPB  Status:  Discontinued        500 mg 200 mL/hr over 30 Minutes Intravenous Every 24 hours 08/27/23 1930 09/27/23 0837   08/27/23 1915  piperacillin -tazobactam (ZOSYN ) IVPB 2.25 g  Status:  Discontinued        2.25 g 100 mL/hr over 30 Minutes Intravenous Every 8 hours 08/27/23 1904 08/27/23 1928   08/27/23 1913  vancomycin  variable dose per unstable renal function (pharmacist dosing)  Status:  Discontinued         Does not apply See admin instructions 08/27/23 1913 27-Sep-2023 0837   08/27/23 1900  azithromycin  (ZITHROMAX ) 500 mg in sodium chloride  0.9 % 250 mL IVPB  Status:  Discontinued        500 mg 250 mL/hr over 60 Minutes Intravenous  Every 24 hours 08/27/23 1845 09-07-23 0837   08/27/23 1900  vancomycin  (VANCOCIN ) IVPB 1000 mg/200 mL premix        1,000 mg 200 mL/hr over 60 Minutes Intravenous  Once 08/27/23 1849 08/27/23 2217   08/27/23 1900  ceFEPIme (MAXIPIME) 1 g in sodium chloride  0.9 % 100 mL IVPB  Status:  Discontinued        1 g 200 mL/hr over 30 Minutes Intravenous Every 24 hours 08/27/23 1849 08/27/23 1859   08/27/23 1545  Ampicillin-Sulbactam (UNASYN) 3 g in sodium chloride  0.9 % 100 mL IVPB  Status:  Discontinued        3 g 200 mL/hr over 30 Minutes Intravenous  Once 08/27/23 1532 08/27/23 1532   08/27/23 1545  cefTRIAXone  (ROCEPHIN ) 1 g in sodium chloride  0.9 % 100 mL IVPB        1 g 200 mL/hr over 30 Minutes Intravenous  Once 08/27/23 1532 08/27/23 1634   08/27/23 1545  azithromycin  (ZITHROMAX ) 500 mg in sodium chloride  0.9 % 250 mL IVPB        500 mg 250 mL/hr over 60 Minutes Intravenous  Once 08/27/23 1532 08/27/23 1658         Objective:   Vitals:   07-Sep-2023  0345 09/07/2023 0400 07-Sep-2023 0657 2023-09-07 0800  BP: (!) 106/59 (!) 103/51  (!) 94/51  Pulse: (!) 186     Resp: 20 (!) 21  19  Temp:   97.7 F (36.5 C)   TempSrc:   Axillary   SpO2: 100% 90%    Weight:      Height:        Wt Readings from Last 3 Encounters:  08/27/23 38.8 kg  11/05/22 45.4 kg  05/06/22 46.8 kg    No intake or output data in the 24 hours ending 09-07-2023 1000   Physical Exam  Frail elderly female in bed, somnolent, minimally responsive, moves all 4 extremities to painful stimuli, Foley catheter in place, Bair hugger,  [place].AT,PERRAL Supple Neck, No JVD,   Symmetrical Chest wall movement, Good air movement bilaterally, CTAB RRR,No Gallops,Rubs or new Murmurs,  +ve B.Sounds, Abd Soft, No tenderness,   No Cyanosis, Clubbing or edema     RN pressure injury documentation: Pressure Injury 04/30/22 Coccyx Right;Left;Medial Deep Tissue Pressure Injury - Purple or maroon localized area of discolored intact skin or blood-filled blister due to damage of underlying soft tissue from pressure and/or shear. (Active)  04/30/22 2345  Location: Coccyx  Location Orientation: Right;Left;Medial  Staging: Deep Tissue Pressure Injury - Purple or maroon localized area of discolored intact skin or blood-filled blister due to damage of underlying soft tissue from pressure and/or shear.  Wound Description (Comments):   Present on Admission: Yes     Pressure Injury 08/27/23 Sacrum Medial Stage 1 -  Intact skin with non-blanchable redness of a localized area usually over a bony prominence. (Active)  08/27/23 2000  Location: Sacrum  Location Orientation: Medial  Staging: Stage 1 -  Intact skin with non-blanchable redness of a localized area usually over a bony prominence.  Wound Description (Comments):   Present on Admission: Yes  Dressing Type Foam - Lift dressing to assess site every shift 08/27/23 2035      Data Review:    Recent Labs  Lab 08/27/23 1518 08/27/23 1619  08/27/23 1620 08/27/23 1624 08/27/23 2030 08/27/23 2214  WBC 29.1*  --   --   --  29.1*  --   HGB 11.9* 10.2* 10.5*  10.2* 11.0* 10.6*  HCT 39.4 30.0* 31.0* 30.0* 34.2* 33.6*  PLT 468*  --   --   --  461*  --   MCV 114.5*  --   --   --  107.2*  --   MCH 34.6*  --   --   --  34.5*  --   MCHC 30.2  --   --   --  32.2  --   RDW 14.1  --   --   --  14.1  --   LYMPHSABS 1.5  --   --   --   --   --   MONOABS 2.6*  --   --   --   --   --   EOSABS 0.0  --   --   --   --   --   BASOSABS 0.0  --   --   --   --   --     Recent Labs  Lab 08/27/23 1518 08/27/23 1615 08/27/23 1618 08/27/23 1619 08/27/23 1620 08/27/23 1624 08/27/23 1753 08/27/23 2030 08/27/23 2201 08/27/23 2355 09-21-23 0632  NA 143  --   --  144 144 145  --  146*  --   --  149*  K 5.9*  --   --  5.3* 5.3* 5.3*  --  4.9  --   --  5.1  CL 114*  --   --  112* 114*  --   --  109  --   --  117*  CO2 11*  --   --   --   --   --   --  17*  --   --  15*  ANIONGAP 18*  --   --   --   --   --   --  20*  --   --  17*  GLUCOSE 122*  --   --  130* 131*  --   --  211*  --   --  124*  BUN 118*  --   --  107* 105*  --   --  113*  --   --  115*  CREATININE 2.97*  --   --  3.10* 3.00*  --   --  3.01*  --   --  3.06*  AST 32  --   --   --   --   --   --  32  --   --  23  ALT 13  --   --   --   --   --   --  14  --   --  13  ALKPHOS 61  --   --   --   --   --   --  55  --   --  31*  BILITOT 1.0  --   --   --   --   --   --  0.7  --   --  0.9  ALBUMIN  2.8*  --   --   --   --   --   --  2.5*  --   --  3.7  CRP  --   --   --   --   --   --   --   --   --   --  9.2*  LATICACIDVEN  --   --    < >  --   --   --  5.5* 6.1* 7.4* 5.9* 3.6*  INR  --  2.6*  --   --   --   --   --   --   --   --  3.9*  TSH  --   --   --   --   --   --   --  1.566  --   --   --   AMMONIA  --   --   --   --   --   --   --  29  --   --   --   BNP  --   --   --   --   --   --   --   --   --   --  755.1*  MG  --   --   --   --   --   --   --   --   --   --  1.9   CALCIUM  11.0*  --   --   --   --   --   --  10.9*  --   --  10.4*   < > = values in this interval not displayed.      Recent Labs  Lab 08/27/23 1518 08/27/23 1615 08/27/23 1618 08/27/23 1753 08/27/23 2030 08/27/23 2201 08/27/23 2355 09/08/2023 1308  CRP  --   --   --   --   --   --   --  9.2*  LATICACIDVEN  --   --    < > 5.5* 6.1* 7.4* 5.9* 3.6*  INR  --  2.6*  --   --   --   --   --  3.9*  TSH  --   --   --   --  1.566  --   --   --   AMMONIA  --   --   --   --  29  --   --   --   BNP  --   --   --   --   --   --   --  755.1*  MG  --   --   --   --   --   --   --  1.9  CALCIUM  11.0*  --   --   --  10.9*  --   --  10.4*   < > = values in this interval not displayed.    --------------------------------------------------------------------------------------------------------------- Lab Results  Component Value Date   CHOL 202 (H) 03/05/2019   HDL 56 03/05/2019   LDLCALC 120 (H) 03/05/2019   TRIG 130 03/05/2019   CHOLHDL 3.6 03/05/2019    Lab Results  Component Value Date   HGBA1C 6.0 (H) 03/06/2019   Recent Labs    08/27/23 2030  TSH 1.566  FREET4 1.21*   Recent Labs    08/27/23 2030  VITAMINB12 643   ------------------------------------------------------------------------------------------------------------------ Cardiac Enzymes No results for input(s): "CKMB", "TROPONINI", "MYOGLOBIN" in the last 168 hours.  Invalid input(s): "CK"  Micro Results Recent Results (from the past 240 hours)  Blood Culture (routine x 2)     Status: None (Preliminary result)   Collection Time: 08/27/23  3:18 PM   Specimen: BLOOD  Result Value Ref Range Status   Specimen Description BLOOD THUMB  Final   Special Requests   Final    BOTTLES DRAWN AEROBIC ONLY Blood Culture results may not be optimal due to an inadequate volume of blood received in culture bottles  Culture   Final    NO GROWTH < 24 HOURS Performed at [facility], [address], [place], [place]  [address]    Report Status PENDING  Incomplete  Blood Culture (routine x 2)     Status: None (Preliminary result)   Collection Time: 08/27/23  3:23 PM   Specimen: BLOOD  Result Value Ref Range Status   Specimen Description BLOOD RIGHT ANTECUBITAL  Final   Special Requests   Final    AEROBIC BOTTLE ONLY Blood Culture results may not be optimal due to an inadequate volume of blood received in culture bottles   Culture   Final    NO GROWTH < 24 HOURS Performed at [facility], [address] [place], [place] [address]    Report Status PENDING  Incomplete    Radiology Report DG Chest Port 1 View Result Date: Sep 19, 2023 CLINICAL DATA:  Shortness of breath. EXAM: PORTABLE CHEST 1 VIEW COMPARISON:  08/27/2023 FINDINGS: Asymmetric elevation left hemidiaphragm versus large hiatal hernia, as before. Marked gaseous distention of the stomach, also similar to prior. Interstitial markings are diffusely coarsened with chronic features. The cardio pericardial silhouette is enlarged. Bones are diffusely demineralized. Status post left shoulder replacement. IMPRESSION: 1. Chronic interstitial coarsening without acute cardiopulmonary findings. 2. Asymmetric elevation left hemidiaphragm versus large hiatal hernia with gaseous distention of the stomach. Electronically Signed   By: Donnal Fusi M.D.   On: 09/19/23 07:07   CT HEAD WO CONTRAST ( ) Result Date: 08/27/2023 CLINICAL DATA:  Delirium EXAM: CT HEAD WITHOUT CONTRAST TECHNIQUE: Contiguous axial images were obtained from the base of the skull through the vertex without intravenous contrast. RADIATION DOSE REDUCTION: This exam was performed according to the departmental dose-optimization program which includes automated exposure control, adjustment of the mA and/or kV according to patient size and/or use of iterative reconstruction technique. COMPARISON:  CT June 09, 2020. FINDINGS: Brain: No evidence of acute infarction, hemorrhage, hydrocephalus,  extra-axial collection or mass lesion/mass effect. Patchy white matter hypodensities, nonspecific but compatible with chronic microvascular ischemic disease. Vascular: Calcific atherosclerosis. Skull: No acute fracture. Sinuses/Orbits: Air-fluid levels in the sinuses. No acute orbital findings. Other: Severe right TMJ degenerative change. IMPRESSION: 1. No evidence of acute intracranial abnormality. 2. Chronic microvascular ischemic disease. 3. Severe right TMJ degenerative change. Electronically Signed   By: Stevenson Elbe M.D.   On: 08/27/2023 20:59   CT CHEST ABDOMEN PELVIS WO CONTRAST Result Date: 08/27/2023 CLINICAL DATA:  Sepsis EXAM: CT CHEST, ABDOMEN AND PELVIS WITHOUT CONTRAST TECHNIQUE: Multidetector CT imaging of the chest, abdomen and pelvis was performed following the standard protocol without IV contrast. RADIATION DOSE REDUCTION: This exam was performed according to the departmental dose-optimization program which includes automated exposure control, adjustment of the mA and/or kV according to patient size and/or use of iterative reconstruction technique. COMPARISON:  04/30/2022, 08/27/2023 FINDINGS: CT CHEST FINDINGS Cardiovascular: Unenhanced imaging of the heart is unremarkable without pericardial effusion. Calcifications of the aortic valve. Normal caliber of the thoracic aorta. Atherosclerosis of the aorta and coronary vasculature. Assessment of the vascular lumen cannot be performed without intravenous contrast. Mediastinum/Nodes: Stable large hiatal hernia extending into the lower left hemithorax, containing the majority of the stomach patulous esophagus again noted. Thyroid and trachea are grossly normal. No pathologic adenopathy. Lungs/Pleura: Trace left pleural effusion. Marked compressive atelectasis within the left lower lobe. There is patchy ground-glass airspace disease within the dependent right lower lobe, which may be related to infection or aspiration. No pneumothorax.  Mild  bronchiectasis, stable. 15 mm noncalcified right middle lobe pulmonary nodule reference image 76/4, not appreciably changed since prior exam. Musculoskeletal: Mild sclerosis inferior endplate T9 vertebral body consistent with age indeterminate compression fracture, new since prior study. Less than 10% loss of height. Unremarkable left shoulder arthroplasty. Severe right shoulder osteoarthritis. Reconstructed images demonstrate no additional findings. CT ABDOMEN PELVIS FINDINGS Hepatobiliary: Minimal calcified gallstones. No evidence of acute cholecystitis. Unremarkable unenhanced appearance of the liver. Pancreas: Unremarkable unenhanced appearance. Spleen: Unremarkable unenhanced appearance. Adrenals/Urinary Tract: No urinary tract calculi or obstructive uropathy. The adrenals are stable. Indwelling Foley catheter, with small amount of gas in the bladder lumen due to catheter placement. Stomach/Bowel: No bowel obstruction or ileus. Large amount of retained stool within the rectum which may reflect fecal impaction. Diverticulosis of the descending and sigmoid colon without diverticulitis. No bowel wall thickening or inflammatory change. Vascular/Lymphatic: Aortic atherosclerosis. No enlarged abdominal or pelvic lymph nodes. Reproductive: Status post hysterectomy. No adnexal masses. Other: No free fluid or free intraperitoneal gas. No abdominal wall hernia. Musculoskeletal: No acute displaced fracture. Progression of the chronic L2 compression fracture seen previously, now with 25-50% loss of height. No retropulsion. Reconstructed images demonstrate no additional findings. IMPRESSION: 1. Patchy right lower lobe ground-glass airspace disease which may reflect infection or aspiration. 2. Large hiatal hernia, with compressive left lower lobe atelectasis and trace left pleural effusion unchanged. 3. Stable 15 mm right middle lobe pulmonary nodule, which can be considered benign given long-term stability. No specific  follow-up recommended. 4. Distal colonic diverticulosis without evidence of diverticulitis. 5. Large amount of retained stool within the colon, which may reflect fecal impaction. No bowel obstruction or ileus. 6. Cholelithiasis without cholecystitis. 7.  Aortic Atherosclerosis (ICD10-I70.0). 8. Age indeterminate compression deformity inferior endplate T9 vertebral body, with less than 10% loss of height. This is new since the 2024 exam, with imaging characteristics suggesting subacute to chronic fracture. Progressive loss of height at a chronic L2 compression fracture as above. Electronically Signed   By: Bobbye Burrow M.D.   On: 08/27/2023 17:39   DG Chest Port 1 View Result Date: 08/27/2023 CLINICAL DATA:  Questionable sepsis EXAM: PORTABLE CHEST 1 VIEW COMPARISON:  Chest x-ray 05/03/2022.  CT of the chest 04/20/2022. FINDINGS: Large hiatal hernia is present. There is gaseous distension of the intrathoracic portion of the stomach. Nodular density in the right perihilar region measures 16 mm and appears unchanged. There is no new focal lung infiltrate, pleural effusion or pneumothorax. The cardiomediastinal silhouette appears stable. No acute fractures are seen. Surgical changes are partially visualized in the left shoulder. IMPRESSION: 1. No acute cardiopulmonary process. 2. Large hiatal hernia with gaseous distension of the intrathoracic portion of the stomach. 3. Stable nodular density in the right perihilar region. Electronically Signed   By: Tyron Gallon M.D.   On: 08/27/2023 15:45     Signature  -   Lynnwood Sauer M.D on 09/13/2023 at 10:00 AM   -  To page go to [email or web address]

## 2023-09-11 NOTE — Progress Notes (Signed)
 On admission, Patient responded to sound and barely opened her eyes. Patient arrived from ED on 4 L nasal cannula. Patient has two IV's with fluid infusing. Patient skin was assessed with charge nurse Landa Pine and RN Bambi Lever. Patient sacrum is red and blanchable, she has excoriation around her rectum and vaginal area due to black liquid tarry stools. Patient has scabs on bilateral lower legs and a skin tear to her left shin. Her heels are intact. Her sacrum, heels, scabs and skin tear were cleaned and covered with Mepilex foam.   Patient also had some black emesis pooling out her mouth with her teeth stained with the black fluids. Patient screams when she is been cleaned and when trying to clean her mouth she tightens her teeth.  Patient is very pale and BP has been low and Dr. Del Favia Hospitalist is aware of this. Patient temperature has been low too and currently has bair hugger on which was started in the ED.  Based on patient physical status and her declining and her need for multiple bolus', family may need to be consulted about comfort care.

## 2023-09-11 DEATH — deceased
